# Patient Record
Sex: Male | Born: 1990
Health system: Southern US, Community
[De-identification: ages and names within clinical notes are randomized; demographics above are authoritative.]

## PROBLEM LIST (undated history)

## (undated) DIAGNOSIS — E119 Type 2 diabetes mellitus without complications: Secondary | ICD-10-CM

## (undated) DIAGNOSIS — K219 Gastro-esophageal reflux disease without esophagitis: Secondary | ICD-10-CM

## (undated) DIAGNOSIS — E669 Obesity, unspecified: Secondary | ICD-10-CM

## (undated) DIAGNOSIS — I10 Essential (primary) hypertension: Secondary | ICD-10-CM

## (undated) DIAGNOSIS — B081 Molluscum contagiosum: Secondary | ICD-10-CM

## (undated) DIAGNOSIS — J42 Unspecified chronic bronchitis: Secondary | ICD-10-CM

## (undated) DIAGNOSIS — R809 Proteinuria, unspecified: Secondary | ICD-10-CM

## (undated) DIAGNOSIS — K861 Other chronic pancreatitis: Secondary | ICD-10-CM

## (undated) DIAGNOSIS — K760 Fatty (change of) liver, not elsewhere classified: Secondary | ICD-10-CM

## (undated) DIAGNOSIS — E781 Pure hyperglyceridemia: Secondary | ICD-10-CM

## (undated) HISTORY — PX: TONSILLECTOMY AND ADENOIDECTOMY: SUR1326

---
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
## 2011-05-17 ENCOUNTER — Emergency Department (HOSPITAL_COMMUNITY)
Admission: EM | Admit: 2011-05-17 | Discharge: 2011-05-18 | Disposition: A | Discharge status: Home / Self Care | Payer: Medicaid Other | Attending: Emergency Medicine | Admitting: Emergency Medicine

## 2011-05-17 ENCOUNTER — Emergency Department (HOSPITAL_COMMUNITY): Payer: Medicaid Other

## 2011-05-17 DIAGNOSIS — R059 Cough, unspecified: Secondary | ICD-10-CM | POA: Insufficient documentation

## 2011-05-17 DIAGNOSIS — R05 Cough: Secondary | ICD-10-CM | POA: Insufficient documentation

## 2011-05-17 DIAGNOSIS — I10 Essential (primary) hypertension: Secondary | ICD-10-CM | POA: Insufficient documentation

## 2011-05-17 DIAGNOSIS — J40 Bronchitis, not specified as acute or chronic: Secondary | ICD-10-CM | POA: Insufficient documentation

## 2011-05-17 DIAGNOSIS — F17200 Nicotine dependence, unspecified, uncomplicated: Secondary | ICD-10-CM | POA: Insufficient documentation

## 2011-05-17 DIAGNOSIS — R0609 Other forms of dyspnea: Secondary | ICD-10-CM | POA: Insufficient documentation

## 2011-05-17 DIAGNOSIS — E7800 Pure hypercholesterolemia, unspecified: Secondary | ICD-10-CM | POA: Insufficient documentation

## 2011-05-17 DIAGNOSIS — R0989 Other specified symptoms and signs involving the circulatory and respiratory systems: Secondary | ICD-10-CM | POA: Insufficient documentation

## 2011-07-08 ENCOUNTER — Emergency Department (HOSPITAL_COMMUNITY)
Admission: EM | Admit: 2011-07-08 | Discharge: 2011-07-08 | Disposition: A | Discharge status: Home / Self Care | Payer: Medicaid Other | Attending: Emergency Medicine | Admitting: Emergency Medicine

## 2011-07-08 DIAGNOSIS — R059 Cough, unspecified: Secondary | ICD-10-CM | POA: Insufficient documentation

## 2011-07-08 DIAGNOSIS — I10 Essential (primary) hypertension: Secondary | ICD-10-CM | POA: Insufficient documentation

## 2011-07-08 DIAGNOSIS — F3289 Other specified depressive episodes: Secondary | ICD-10-CM | POA: Insufficient documentation

## 2011-07-08 DIAGNOSIS — E7800 Pure hypercholesterolemia, unspecified: Secondary | ICD-10-CM | POA: Insufficient documentation

## 2011-07-08 DIAGNOSIS — R509 Fever, unspecified: Secondary | ICD-10-CM | POA: Insufficient documentation

## 2011-07-08 DIAGNOSIS — R05 Cough: Secondary | ICD-10-CM | POA: Insufficient documentation

## 2011-07-08 DIAGNOSIS — F329 Major depressive disorder, single episode, unspecified: Secondary | ICD-10-CM | POA: Insufficient documentation

## 2011-07-08 DIAGNOSIS — J45909 Unspecified asthma, uncomplicated: Secondary | ICD-10-CM | POA: Insufficient documentation

## 2011-07-08 DIAGNOSIS — J3489 Other specified disorders of nose and nasal sinuses: Secondary | ICD-10-CM | POA: Insufficient documentation

## 2011-07-10 LAB — CBC
Hemoglobin: 14.4 g/dL (ref 12.0–16.0)
MCHC: 34.1 g/dL (ref 31.0–37.0)
MCV: 83.3 fL (ref 78.0–98.0)
Platelets: 274 10*3/uL (ref 150–400)
RDW: 13.3 % (ref 11.4–15.5)
WBC: 5.6 10*3/uL (ref 4.5–13.5)

## 2011-07-10 LAB — RAPID URINE DRUG SCREEN, HOSP PERFORMED
Barbiturates: NOT DETECTED
Cocaine: NOT DETECTED

## 2011-07-10 LAB — URINALYSIS, ROUTINE W REFLEX MICROSCOPIC
Bilirubin Urine: NEGATIVE
Hgb urine dipstick: NEGATIVE
Specific Gravity, Urine: 1.022 (ref 1.005–1.030)
Urobilinogen, UA: 0.2 mg/dL (ref 0.0–1.0)

## 2011-07-10 LAB — BASIC METABOLIC PANEL
BUN: 8 mg/dL (ref 6–23)
Chloride: 106 mEq/L (ref 96–112)
Creatinine, Ser: 0.91 mg/dL (ref 0.4–1.5)
Sodium: 140 mEq/L (ref 135–145)

## 2011-07-10 LAB — URINALYSIS, MICROSCOPIC ONLY
Leukocytes, UA: NEGATIVE
Protein, ur: NEGATIVE mg/dL
Specific Gravity, Urine: 1.021 (ref 1.005–1.030)
Urobilinogen, UA: 1 mg/dL (ref 0.0–1.0)

## 2011-07-10 LAB — DIFFERENTIAL
Basophils Relative: 1 % (ref 0–1)
Eosinophils Absolute: 0.1 10*3/uL (ref 0.0–1.2)
Eosinophils Relative: 2 % (ref 0–5)
Lymphocytes Relative: 35 % (ref 24–48)
Lymphs Abs: 2 10*3/uL (ref 1.1–4.8)
Neutrophils Relative %: 55 % (ref 43–71)

## 2011-07-10 LAB — C4 COMPLEMENT: Complement C4, Body Fluid: 28 mg/dL (ref 16–47)

## 2011-07-10 LAB — PROTEIN / CREATININE RATIO, URINE
Creatinine, Urine: 97.8 mg/dL
Total Protein, Urine: 7 mg/dL

## 2011-07-10 LAB — C3 COMPLEMENT: C3 Complement: 132 mg/dL (ref 88–201)

## 2011-07-10 LAB — ACETAMINOPHEN LEVEL: Acetaminophen (Tylenol), Serum: <10 ug/mL — ABNORMAL LOW (ref 10–30)

## 2012-04-09 ENCOUNTER — Encounter (HOSPITAL_COMMUNITY): Payer: Self-pay | Admitting: *Deleted

## 2012-04-09 ENCOUNTER — Emergency Department (HOSPITAL_COMMUNITY)
Admission: EM | Admit: 2012-04-09 | Discharge: 2012-04-10 | Disposition: A | Discharge status: Home / Self Care | Payer: No Typology Code available for payment source | Attending: Emergency Medicine | Admitting: Emergency Medicine

## 2012-04-09 ENCOUNTER — Emergency Department (HOSPITAL_COMMUNITY): Payer: No Typology Code available for payment source

## 2012-04-09 DIAGNOSIS — K219 Gastro-esophageal reflux disease without esophagitis: Secondary | ICD-10-CM | POA: Insufficient documentation

## 2012-04-09 DIAGNOSIS — F17200 Nicotine dependence, unspecified, uncomplicated: Secondary | ICD-10-CM | POA: Insufficient documentation

## 2012-04-09 DIAGNOSIS — R0789 Other chest pain: Secondary | ICD-10-CM

## 2012-04-09 MED ORDER — GI COCKTAIL ~~LOC~~
30.0000 mL | Freq: Once | ORAL | Status: AC
  Administered 2012-04-10: 30 mL via ORAL
  Filled 2012-04-09: qty 30

## 2012-04-09 NOTE — ED Provider Notes (Addendum)
History     CSN: [id number]  Arrival date & time 04/09/12  2017   First MD Initiated Contact with Patient 04/09/12 2303      Chief Complaint  Patient presents with  . Chest Pain    (Consider location/radiation/quality/duration/timing/severity/associated sxs/prior treatment) HPI 21 year old male presents to the emergency department complaining of acid reflux and chest pain. Had reflux symptoms in the past. He reports this morning he woke up around 10:30 with a hot burning sensation in his upper chest and throat. He took 2 Prevacid tablets, and reports the burning improved. He has had persistent heaviness in his chest with sharp pains when he moves around. When he sits forward, pain goes into his back. He denies shortness of breath, nausea, fever. Patient's father had MI in his 36s, had history of diabetes, hypertension, CHF. Patient reports he has had bronchitis in the past, and thinks that his symptoms may be due to bronchitis. Patient smokes 5 cigarettes a day. He denies any  drugs or alcohol.  Patient denies any leg swelling, no history of PE or DVT in himself or family.  No immobilization.    Past Medical History  Diagnosis Date  . Bronchitis     History reviewed. No pertinent past surgical history.  History reviewed. No pertinent family history.  History  Substance Use Topics  . Smoking status: Current Everyday Smoker  . Smokeless tobacco: Not on file  . Alcohol Use: No      Review of Systems  All other systems reviewed and are negative.    Allergies  Review of patient's allergies indicates no known allergies.  Home Medications   Current Outpatient Rx  Name Route Sig Dispense Refill  . LANSOPRAZOLE 30 MG PO CPDR Oral Take 30 mg by mouth daily.      BP 159/93  Pulse 74  Temp 98.1 F (36.7 C) (Oral)  Resp 16  SpO2 99%  Physical Exam  Nursing note and vitals reviewed. Constitutional: He is oriented to person, place, and time. He appears well-developed and  well-nourished.       obese  HENT:  Head: Normocephalic and atraumatic.  Nose: Nose normal.  Mouth/Throat: Oropharynx is clear and moist.  Eyes: Conjunctivae and EOM are normal. Pupils are equal, round, and reactive to light.  Neck: Normal range of motion. Neck supple. No JVD present. No tracheal deviation present. No thyromegaly present.  Cardiovascular: Normal rate, regular rhythm, normal heart sounds and intact distal pulses.  Exam reveals no gallop and no friction rub.   No murmur heard. Pulmonary/Chest: Effort normal and breath sounds normal. No stridor. No respiratory distress. He has no wheezes. He has no rales. He exhibits tenderness (pain with palpation of sternum, reproducing pain).  Abdominal: Soft. Bowel sounds are normal. He exhibits no distension and no mass. There is no tenderness. There is no rebound and no guarding.  Musculoskeletal: Normal range of motion. He exhibits no edema and no tenderness.  Lymphadenopathy:    He has no cervical adenopathy.  Neurological: He is oriented to person, place, and time. He exhibits normal muscle tone. Coordination normal.  Skin: Skin is dry. No rash noted. No erythema. No pallor.  Psychiatric: He has a normal mood and affect. His behavior is normal. Judgment and thought content normal.    ED Course  Procedures (including critical care time)   Labs Reviewed  CARDIAC PANEL(CRET KIN+CKTOT+MB+TROPI)  CBC  BASIC METABOLIC PANEL   No results found.     Date: 04/09/2012  Rate:  86  Rhythm: normal sinus rhythm  QRS Axis: normal  Intervals: normal  ST/T Wave abnormalities: t wave inversions III, AVF  Conduction Disutrbances:none  Narrative Interpretation: t wave changes new  Old EKG Reviewed: changes noted    1. Atypical chest pain   2. GERD (gastroesophageal reflux disease)       MDM  21 yo male with GERD type symptoms, but with new t wave changes.  Pain has been present since 1030 am.  Feel this is very unlikely to be ACS,  but myocarditis, bronchitis, or esophageal spasm are in differential.  To have labs, cxr, gi coctail.      12:47 AM Pt feeling slightly better.  labwork unremarkable.  Will d/c home with carafate and scheduled prevacid  Olivia Mackie, MD 04/10/12 6213  Olivia Mackie, MD 04/10/12 [id number]

## 2012-04-09 NOTE — ED Notes (Signed)
Pt states he woke up with acid reflux and took 2 OTC acid reflux medications the acid reflux went away but the pain did not. Pt states that when he bends over he has a pain that goes through to his back. Pt states heaviness to chest.

## 2012-04-10 LAB — CBC
HCT: 39.8 % (ref 39.0–52.0)
MCHC: 34.9 g/dL (ref 30.0–36.0)
RDW: 13.2 % (ref 11.5–15.5)

## 2012-04-10 LAB — BASIC METABOLIC PANEL
BUN: 6 mg/dL (ref 6–23)
Calcium: 9.4 mg/dL (ref 8.4–10.5)
Chloride: 103 mEq/L (ref 96–112)
Creatinine, Ser: 0.66 mg/dL (ref 0.50–1.35)
GFR calc Af Amer: >90 mL/min (ref >90)
GFR calc non Af Amer: >90 mL/min (ref >90)

## 2012-04-10 LAB — CARDIAC PANEL(CRET KIN+CKTOT+MB+TROPI)
CK, MB: 3.4 ng/mL (ref 0.3–4.0)
Total CK: 356 U/L — ABNORMAL HIGH (ref 7–232)
Troponin I: <0.3 ng/mL (ref <0.30)

## 2012-04-10 MED ORDER — LANSOPRAZOLE 30 MG PO CPDR: 30.0000 mg | DELAYED_RELEASE_CAPSULE | Freq: Every day | ORAL | Status: DC

## 2012-04-10 MED ORDER — SUCRALFATE 1 G PO TABS: 1.0000 g | ORAL_TABLET | Freq: Four times a day (QID) | ORAL | Status: DC

## 2012-04-10 NOTE — ED Notes (Signed)
Pt states understanding of discharge instructions 

## 2012-09-05 ENCOUNTER — Ambulatory Visit
Admission: RE | Admit: 2012-09-05 | Payer: No Typology Code available for payment source | Source: Ambulatory Visit | Admitting: Radiation Oncology

## 2012-12-09 ENCOUNTER — Emergency Department (HOSPITAL_COMMUNITY)
Admission: EM | Admit: 2012-12-09 | Discharge: 2012-12-09 | Disposition: A | Discharge status: Home / Self Care | Payer: Self-pay | Attending: Emergency Medicine | Admitting: Emergency Medicine

## 2012-12-09 ENCOUNTER — Other Ambulatory Visit: Payer: Self-pay

## 2012-12-09 ENCOUNTER — Emergency Department (HOSPITAL_COMMUNITY): Payer: Self-pay

## 2012-12-09 ENCOUNTER — Encounter (HOSPITAL_COMMUNITY): Payer: Self-pay | Admitting: Emergency Medicine

## 2012-12-09 DIAGNOSIS — F17200 Nicotine dependence, unspecified, uncomplicated: Secondary | ICD-10-CM | POA: Insufficient documentation

## 2012-12-09 DIAGNOSIS — Z79899 Other long term (current) drug therapy: Secondary | ICD-10-CM | POA: Insufficient documentation

## 2012-12-09 DIAGNOSIS — R0789 Other chest pain: Secondary | ICD-10-CM

## 2012-12-09 DIAGNOSIS — R071 Chest pain on breathing: Secondary | ICD-10-CM | POA: Insufficient documentation

## 2012-12-09 DIAGNOSIS — Z8709 Personal history of other diseases of the respiratory system: Secondary | ICD-10-CM | POA: Insufficient documentation

## 2012-12-09 DIAGNOSIS — K219 Gastro-esophageal reflux disease without esophagitis: Secondary | ICD-10-CM | POA: Insufficient documentation

## 2012-12-09 DIAGNOSIS — I10 Essential (primary) hypertension: Secondary | ICD-10-CM | POA: Insufficient documentation

## 2012-12-09 HISTORY — DX: Essential (primary) hypertension: I10

## 2012-12-09 MED ORDER — KETOROLAC TROMETHAMINE 60 MG/2ML IM SOLN
60.0000 mg | Freq: Once | INTRAMUSCULAR | Status: AC
  Administered 2012-12-09: 60 mg via INTRAMUSCULAR
  Filled 2012-12-09: qty 2

## 2012-12-09 MED ORDER — GUAIFENESIN ER 1200 MG PO TB12: 1.0000 | ORAL_TABLET | Freq: Two times a day (BID) | ORAL | Status: DC

## 2012-12-09 MED ORDER — GI COCKTAIL ~~LOC~~
30.0000 mL | Freq: Once | ORAL | Status: AC
  Administered 2012-12-09: 30 mL via ORAL
  Filled 2012-12-09: qty 30

## 2012-12-09 MED ORDER — HYDROCODONE-ACETAMINOPHEN 5-325 MG PO TABS
1.0000 | ORAL_TABLET | Freq: Once | ORAL | Status: AC
  Administered 2012-12-09: 1 via ORAL
  Filled 2012-12-09: qty 1

## 2012-12-09 MED ORDER — IBUPROFEN 800 MG PO TABS: 800.0000 mg | ORAL_TABLET | Freq: Three times a day (TID) | ORAL | Status: DC | PRN

## 2012-12-09 MED ORDER — HYDROCODONE-ACETAMINOPHEN 5-325 MG PO TABS: 1.0000 | ORAL_TABLET | Freq: Four times a day (QID) | ORAL | Status: DC | PRN

## 2012-12-09 NOTE — ED Provider Notes (Signed)
History     CSN: [id number]  Arrival date & time 12/09/12  0821   First MD Initiated Contact with Patient 12/09/12 [id number]      Chief Complaint  Patient presents with  . Chest Pain    (Consider location/radiation/quality/duration/timing/severity/associated sxs/prior treatment) HPI Patient presents to the emergency department with bilateral chest pain that began this morning.  Patient, states, that he was having indigestion last night, but now he's having diffuse, chest pain is worse with palpation.  Patient denies shortness of breath, nausea, vomiting, diaphoresis, headache, blurred vision, fever, abdominal pain, or back pain.  Patient, states, that he had a similar episode of this type of chest pain before and was seen here in the emergency department and discharged home.  Patient, states, that he did not take anything prior to arrival for his symptoms.  Past Medical History  Diagnosis Date  . Bronchitis   . Hypertension   . Acid reflux     Past Surgical History  Procedure Laterality Date  . Tonsillectomy      History reviewed. No pertinent family history.  History  Substance Use Topics  . Smoking status: Current Every Day Smoker -- 0.50 packs/day  . Smokeless tobacco: Not on file  . Alcohol Use: No      Review of Systems All other systems negative except as documented in the HPI. All pertinent positives and negatives as reviewed in the HPI. Allergies  Review of patient's allergies indicates no known allergies.  Home Medications   Current Outpatient Rx  Name  Route  Sig  Dispense  Refill  . lansoprazole (PREVACID) 30 MG capsule   Oral   Take 30 mg by mouth daily.           BP 141/76  Pulse 75  Temp(Src) 98.1 F (36.7 C) (Oral)  Resp 15  SpO2 99%  Physical Exam  Nursing note and vitals reviewed. Constitutional: He is oriented to person, place, and time. He appears well-developed and well-nourished. No distress.  HENT:  Head: Normocephalic and  atraumatic.  Mouth/Throat: Oropharynx is clear and moist.  Neck: Normal range of motion. Neck supple.  Cardiovascular: Normal rate, regular rhythm and normal heart sounds.  Exam reveals no gallop and no friction rub.   No murmur heard. Pulmonary/Chest: Effort normal and breath sounds normal. No respiratory distress. He has no wheezes. He has no rales. He exhibits tenderness. He exhibits no crepitus and no deformity.    Abdominal: Soft. Bowel sounds are normal. He exhibits no distension. There is no tenderness. There is no guarding.  Neurological: He is alert and oriented to person, place, and time.  Skin: Skin is warm and dry. No erythema.    ED Course  Procedures (including critical care time)  Labs Reviewed - No data to display Dg Chest 2 View  12/09/2012  *RADIOLOGY REPORT*  Clinical Data: Chest pain  CHEST - 2 VIEW  Comparison: 04/09/2012  Findings: Upper normal heart size.  Lungs under aerated and grossly clear.  No pneumothorax or pleural effusion.  No acute bony deformity.  IMPRESSION: No active cardiopulmonary disease.   Original Report Authenticated By: Jolaine Click, M.D.    Patient has what appears to be atypical type chest pain, based on his history of present illness and physical exam findings.  The patient has diffuse palpable chest pain on exam.  The patient is PERC negative.  Patient does not have any history of blood clots and no family history of blood clots.  Patient has  been otherwise stable here in the emergency room other than pain.  I did not feel any further testing needed to be performed on the patient based on the fact that he had a normal chest x-ray and EKG.     MDM   Date: 12/09/2012  Rate: 71  Rhythm: normal sinus rhythm  QRS Axis: normal  Intervals: normal  ST/T Wave abnormalities: normal  Conduction Disutrbances:none  Narrative Interpretation:   Old EKG Reviewed: unchanged  MDM Reviewed: vitals, nursing note and previous chart Reviewed previous: labs  and ECG Interpretation: ECG and x-ray            Carlyle Dolly, PA-C 12/09/12 1051

## 2012-12-09 NOTE — ED Provider Notes (Signed)
Medical screening examination/treatment/procedure(s) were performed by non-physician practitioner and as supervising physician I was immediately available for consultation/collaboration.  Nathan R. Pickering, MD 12/09/12 1519 

## 2012-12-09 NOTE — ED Notes (Signed)
Patient transported to X-ray 

## 2012-12-09 NOTE — ED Notes (Signed)
Pt from home, via ems, c/o CP. Pt woke up w/ pounding pain moving to different spots in chest. Pt states emesis this am.

## 2013-11-05 DIAGNOSIS — K760 Fatty (change of) liver, not elsewhere classified: Secondary | ICD-10-CM

## 2013-11-05 HISTORY — DX: Fatty (change of) liver, not elsewhere classified: K76.0

## 2013-11-18 ENCOUNTER — Emergency Department (HOSPITAL_COMMUNITY)
Admission: EM | Admit: 2013-11-18 | Discharge: 2013-11-19 | Disposition: A | Discharge status: Home / Self Care | Payer: Medicaid Other | Attending: Emergency Medicine | Admitting: Emergency Medicine

## 2013-11-18 ENCOUNTER — Emergency Department (HOSPITAL_COMMUNITY): Payer: Self-pay

## 2013-11-18 ENCOUNTER — Encounter (HOSPITAL_COMMUNITY): Payer: Self-pay | Admitting: Emergency Medicine

## 2013-11-18 DIAGNOSIS — R110 Nausea: Secondary | ICD-10-CM | POA: Insufficient documentation

## 2013-11-18 DIAGNOSIS — R109 Unspecified abdominal pain: Secondary | ICD-10-CM

## 2013-11-18 DIAGNOSIS — R81 Glycosuria: Secondary | ICD-10-CM

## 2013-11-18 DIAGNOSIS — F17200 Nicotine dependence, unspecified, uncomplicated: Secondary | ICD-10-CM | POA: Insufficient documentation

## 2013-11-18 DIAGNOSIS — R630 Anorexia: Secondary | ICD-10-CM | POA: Insufficient documentation

## 2013-11-18 DIAGNOSIS — Z8709 Personal history of other diseases of the respiratory system: Secondary | ICD-10-CM | POA: Insufficient documentation

## 2013-11-18 DIAGNOSIS — I10 Essential (primary) hypertension: Secondary | ICD-10-CM | POA: Insufficient documentation

## 2013-11-18 DIAGNOSIS — R1084 Generalized abdominal pain: Secondary | ICD-10-CM | POA: Insufficient documentation

## 2013-11-18 LAB — URINE MICROSCOPIC-ADD ON

## 2013-11-18 LAB — COMPREHENSIVE METABOLIC PANEL
ALT: 13 U/L (ref 0–53)
AST: 106 U/L — ABNORMAL HIGH (ref 0–37)
Albumin: 4 g/dL (ref 3.5–5.2)
Alkaline Phosphatase: 42 U/L (ref 39–117)
BUN: 11 mg/dL (ref 6–23)
CO2: 18 meq/L — AB (ref 19–32)
CREATININE: 0.71 mg/dL (ref 0.50–1.35)
Calcium: 8.7 mg/dL (ref 8.4–10.5)
Chloride: 90 mEq/L — ABNORMAL LOW (ref 96–112)
GLUCOSE: 268 mg/dL — AB (ref 70–99)
Potassium: 7.2 mEq/L (ref 3.7–5.3)
Sodium: 127 mEq/L — ABNORMAL LOW (ref 137–147)
TOTAL PROTEIN: 7.8 g/dL (ref 6.0–8.3)
Total Bilirubin: 1.1 mg/dL (ref 0.3–1.2)

## 2013-11-18 LAB — CBC WITH DIFFERENTIAL/PLATELET
Basophils Absolute: 0 10*3/uL (ref 0.0–0.1)
Basophils Relative: 0 % (ref 0–1)
Eosinophils Absolute: 0.1 10*3/uL (ref 0.0–0.7)
Eosinophils Relative: 1 % (ref 0–5)
HEMATOCRIT: 42.2 % (ref 39.0–52.0)
HEMOGLOBIN: 16.4 g/dL (ref 13.0–17.0)
LYMPHS PCT: 22 % (ref 12–46)
Lymphs Abs: 1.6 10*3/uL (ref 0.7–4.0)
MCH: 30.4 pg (ref 26.0–34.0)
MCHC: 38.9 g/dL — ABNORMAL HIGH (ref 30.0–36.0)
MCV: 78.1 fL (ref 78.0–100.0)
MONO ABS: 0.5 10*3/uL (ref 0.1–1.0)
Monocytes Relative: 6 % (ref 3–12)
NEUTROS ABS: 5.1 10*3/uL (ref 1.7–7.7)
NEUTROS PCT: 70 % (ref 43–77)
Platelets: 272 10*3/uL (ref 150–400)
RBC: 5.4 MIL/uL (ref 4.22–5.81)
RDW: 13.2 % (ref 11.5–15.5)
WBC: 7.3 10*3/uL (ref 4.0–10.5)

## 2013-11-18 LAB — URINALYSIS, ROUTINE W REFLEX MICROSCOPIC
Glucose, UA: >1000 mg/dL — AB
Ketones, ur: 40 mg/dL — AB
Leukocytes, UA: NEGATIVE
NITRITE: NEGATIVE
PH: 6 (ref 5.0–8.0)
Protein, ur: 100 mg/dL — AB
Specific Gravity, Urine: >1.046 — ABNORMAL HIGH (ref 1.005–1.030)
Urobilinogen, UA: 0.2 mg/dL (ref 0.0–1.0)

## 2013-11-18 LAB — LIPASE, BLOOD: LIPASE: 48 U/L (ref 11–59)

## 2013-11-18 MED ORDER — ONDANSETRON HCL 4 MG/2ML IJ SOLN
4.0000 mg | Freq: Once | INTRAMUSCULAR | Status: AC
  Administered 2013-11-18: 4 mg via INTRAVENOUS
  Filled 2013-11-18: qty 2

## 2013-11-18 MED ORDER — SODIUM CHLORIDE 0.9 % IV SOLN
Freq: Once | INTRAVENOUS | Status: AC
  Administered 2013-11-18: via INTRAVENOUS

## 2013-11-18 MED ORDER — IOHEXOL 300 MG/ML  SOLN
100.0000 mL | Freq: Once | INTRAMUSCULAR | Status: AC | PRN
  Administered 2013-11-18: 100 mL via INTRAVENOUS

## 2013-11-18 MED ORDER — HYDROMORPHONE HCL PF 1 MG/ML IJ SOLN
1.0000 mg | Freq: Once | INTRAMUSCULAR | Status: AC
  Administered 2013-11-19: 1 mg via INTRAVENOUS
  Filled 2013-11-18: qty 1

## 2013-11-18 MED ORDER — HYDROMORPHONE HCL PF 1 MG/ML IJ SOLN
1.0000 mg | Freq: Once | INTRAMUSCULAR | Status: AC
  Administered 2013-11-18: 1 mg via INTRAVENOUS
  Filled 2013-11-18: qty 1

## 2013-11-18 MED ORDER — GI COCKTAIL ~~LOC~~
30.0000 mL | Freq: Once | ORAL | Status: AC
  Administered 2013-11-19: 30 mL via ORAL
  Filled 2013-11-18: qty 30

## 2013-11-18 MED ORDER — FAMOTIDINE IN NACL 20-0.9 MG/50ML-% IV SOLN
20.0000 mg | Freq: Once | INTRAVENOUS | Status: AC
  Administered 2013-11-19: 20 mg via INTRAVENOUS
  Filled 2013-11-18: qty 50

## 2013-11-18 MED ORDER — IOHEXOL 300 MG/ML  SOLN
25.0000 mL | Freq: Once | INTRAMUSCULAR | Status: AC | PRN
  Administered 2013-11-18: 25 mL via ORAL

## 2013-11-18 MED ORDER — SODIUM CHLORIDE 0.9 % IV SOLN
Freq: Once | INTRAVENOUS | Status: AC
  Administered 2013-11-18: 20:00:00 via INTRAVENOUS

## 2013-11-18 NOTE — ED Provider Notes (Signed)
CSN: [id number]     Arrival date & time 11/18/13  1805 History   First MD Initiated Contact with Patient 11/18/13 1908     Chief Complaint  Patient presents with  . Abdominal Pain     (Consider location/radiation/quality/duration/timing/severity/associated sxs/prior Treatment) HPI  Patient presents with abdominal pain.  Symptoms began approximately 24 hours ago, though they were not severe until approximately 18 hours ago. At the time he has had persistent pain in the abdomen, diffusely.  Pain is sore. There is associated nausea, anorexia, but no emesis or diarrhea. No fever, no chills. No relief with OTC medication, no clear exacerbating factors. Patient has no history of abdominal surgery. He denies significant medical problems.   Past Medical History  Diagnosis Date  . Bronchitis   . Hypertension   . Acid reflux    Past Surgical History  Procedure Laterality Date  . Tonsillectomy     History reviewed. No pertinent family history. History  Substance Use Topics  . Smoking status: Current Every Day Smoker -- 0.50 packs/day  . Smokeless tobacco: Not on file  . Alcohol Use: No    Review of Systems  Constitutional:       Per HPI, otherwise negative  HENT:       Per HPI, otherwise negative  Respiratory:       Per HPI, otherwise negative  Cardiovascular:       Per HPI, otherwise negative  Gastrointestinal: Positive for nausea. Negative for vomiting.  Endocrine:       Negative aside from HPI  Genitourinary:       Neg aside from HPI   Musculoskeletal:       Per HPI, otherwise negative  Skin: Negative.   Neurological: Negative for syncope.      Allergies  Review of patient's allergies indicates no known allergies.  Home Medications   Current Outpatient Rx  Name  Route  Sig  Dispense  Refill  . ibuprofen (ADVIL,MOTRIN) 800 MG tablet   Oral   Take 1 tablet (800 mg total) by mouth every 8 (eight) hours as needed for pain.   21 tablet   0    BP 144/58   Pulse 102  Temp(Src) 98.6 F (37 C) (Oral)  Resp 16  Wt 294 lb (133.358 kg)  SpO2 97% Physical Exam  Nursing note and vitals reviewed. Constitutional: He is oriented to person, place, and time. He appears well-developed. No distress.  HENT:  Head: Normocephalic and atraumatic.  Eyes: Conjunctivae and EOM are normal.  Cardiovascular: Normal rate and regular rhythm.   Pulmonary/Chest: Effort normal. No stridor. No respiratory distress.  Abdominal: He exhibits no distension. There is generalized tenderness. There is guarding and tenderness at McBurney's point. There is no rigidity.  Musculoskeletal: He exhibits no edema.  Neurological: He is alert and oriented to person, place, and time.  Skin: Skin is warm and dry.  Psychiatric: He has a normal mood and affect.    ED Course  Procedures (including critical care time) Labs Review Labs Reviewed  CBC WITH DIFFERENTIAL - Abnormal; Notable for the following:    MCHC 38.9 (*)    All other components within normal limits  URINALYSIS, ROUTINE W REFLEX MICROSCOPIC - Abnormal; Notable for the following:    APPearance CLOUDY (*)    Specific Gravity, Urine >1.046 (*)    Glucose, UA >1000 (*)    Hgb urine dipstick MODERATE (*)    Bilirubin Urine SMALL (*)    Ketones, ur 40 (*)  Protein, ur 100 (*)    All other components within normal limits  URINE MICROSCOPIC-ADD ON - Abnormal; Notable for the following:    Squamous Epithelial / LPF FEW (*)    Bacteria, UA FEW (*)    Casts HYALINE CASTS (*)    All other components within normal limits  COMPREHENSIVE METABOLIC PANEL - Abnormal; Notable for the following:    Sodium 127 (*)    Potassium 7.2 (*)    Chloride 90 (*)    CO2 18 (*)    Glucose, Bld 268 (*)    AST 106 (*)    All other components within normal limits  LIPASE, BLOOD  COMPREHENSIVE METABOLIC PANEL  LIPASE, BLOOD  COMPREHENSIVE METABOLIC PANEL    11:53 PM Patient appears comfortable.  Labs hemolyzed - resent Other  labs notable for glucosuria and ketonuria. IVF started on initial eval - now continuing.  12:49 AM Patient asymptomatic, vital signs improved substantially.  I had a lengthy conversation with him, his father, and brother about all results, including the hyperglycemia.  Patient has no history of diabetes.  Given the finding of inflammatory bowel condition, patient will be treated for this, though he will require followup to ensure that he is a diabetic as well. MDM  This young male presents with one day of abdominal pain, nausea, anorexia, no vomiting, no diarrhea.  With tenderness to palpation about the right lateral abdomen, though no peritonitis, there was concern for appendicitis.  CT scan demonstrates likely duodenitis, no appendicitis, no frank cholecystitis, no perforation or abscess. Patient's labs demonstrate hematuria, glucosuria, and hyperglycemia. Patient has no Hx of diabetes.  Patient will follow up with her primary care physician for repeat testing to establish if this is a concurrent new condition. With resolution of his pain, stable vital signs, minimal gap, though the patient does have the aforementioned lab abnormalities, he is stable for discharge with further evaluation, management as an outpatient    Patient had pain management here, remained hemodynamically stable, was discharged in stable condition to follow up with a new primary care team and gastroenterology.  Patient's course was prolonged as labs were hemolyzed on multiple draws.  Gerhard Munchobert Shauna Bodkins, MD 11/19/13 [phone]

## 2013-11-18 NOTE — ED Notes (Signed)
Pt in c/o generalized abd pain with nausea since last night with body aches

## 2013-11-19 LAB — COMPREHENSIVE METABOLIC PANEL
ALK PHOS: 65 U/L (ref 39–117)
ALT: 23 U/L (ref 0–53)
AST: 24 U/L (ref 0–37)
Albumin: 3.8 g/dL (ref 3.5–5.2)
BILIRUBIN TOTAL: 1.2 mg/dL (ref 0.3–1.2)
BUN: 10 mg/dL (ref 6–23)
CHLORIDE: 89 meq/L — AB (ref 96–112)
CO2: 22 mEq/L (ref 19–32)
Calcium: 8.8 mg/dL (ref 8.4–10.5)
Creatinine, Ser: 0.73 mg/dL (ref 0.50–1.35)
GFR calc Af Amer: >90 mL/min (ref >90)
GFR calc non Af Amer: >90 mL/min (ref >90)
Glucose, Bld: 248 mg/dL — ABNORMAL HIGH (ref 70–99)
POTASSIUM: 4.8 meq/L (ref 3.7–5.3)
Sodium: 127 mEq/L — ABNORMAL LOW (ref 137–147)
TOTAL PROTEIN: 7.2 g/dL (ref 6.0–8.3)

## 2013-11-19 MED ORDER — FAMOTIDINE 20 MG PO TABS: 20.0000 mg | ORAL_TABLET | Freq: Two times a day (BID) | ORAL | Status: DC

## 2013-11-19 MED ORDER — ONDANSETRON 4 MG PO TBDP: 4.0000 mg | ORAL_TABLET | Freq: Three times a day (TID) | ORAL | Status: DC | PRN

## 2013-11-19 MED ORDER — SUCRALFATE 1 GM/10ML PO SUSP: 1.0000 g | Freq: Three times a day (TID) | ORAL | Status: DC

## 2013-11-19 NOTE — ED Notes (Signed)
Pt states understanding of discharge instructions 

## 2013-11-19 NOTE — Discharge Instructions (Signed)
As discussed, your evaluation in the emergency department has resulted in a diagnosis of abdominal pain will likely do to duodenitis - or inflammation of the first part of your small intestines.  However, with the additional concern of elevated blood sugar, it is very important that you have your blood checked again this week, as your pain improves, to ensure that you do not have diabetes.  Please take all medication as directed and be sure to follow-up with your physicians.  Return here for concerning changes in your condition.

## 2013-11-20 ENCOUNTER — Inpatient Hospital Stay (HOSPITAL_COMMUNITY)
Admission: EM | Admit: 2013-11-20 | Discharge: 2013-11-24 | DRG: 392 | Disposition: A | Discharge status: Home / Self Care | Payer: Self-pay | Attending: Internal Medicine | Admitting: Internal Medicine

## 2013-11-20 ENCOUNTER — Encounter (HOSPITAL_COMMUNITY): Payer: Self-pay | Admitting: Emergency Medicine

## 2013-11-20 ENCOUNTER — Emergency Department (HOSPITAL_COMMUNITY): Payer: Self-pay

## 2013-11-20 ENCOUNTER — Inpatient Hospital Stay (HOSPITAL_COMMUNITY): Payer: Medicaid Other

## 2013-11-20 DIAGNOSIS — F17200 Nicotine dependence, unspecified, uncomplicated: Secondary | ICD-10-CM | POA: Diagnosis present

## 2013-11-20 DIAGNOSIS — R739 Hyperglycemia, unspecified: Secondary | ICD-10-CM | POA: Diagnosis present

## 2013-11-20 DIAGNOSIS — I10 Essential (primary) hypertension: Secondary | ICD-10-CM | POA: Diagnosis present

## 2013-11-20 DIAGNOSIS — M79609 Pain in unspecified limb: Secondary | ICD-10-CM

## 2013-11-20 DIAGNOSIS — Z833 Family history of diabetes mellitus: Secondary | ICD-10-CM

## 2013-11-20 DIAGNOSIS — E119 Type 2 diabetes mellitus without complications: Secondary | ICD-10-CM | POA: Diagnosis present

## 2013-11-20 DIAGNOSIS — R17 Unspecified jaundice: Secondary | ICD-10-CM | POA: Diagnosis present

## 2013-11-20 DIAGNOSIS — R112 Nausea with vomiting, unspecified: Principal | ICD-10-CM | POA: Diagnosis present

## 2013-11-20 DIAGNOSIS — E118 Type 2 diabetes mellitus with unspecified complications: Secondary | ICD-10-CM

## 2013-11-20 DIAGNOSIS — R109 Unspecified abdominal pain: Secondary | ICD-10-CM

## 2013-11-20 DIAGNOSIS — R7309 Other abnormal glucose: Secondary | ICD-10-CM

## 2013-11-20 DIAGNOSIS — K219 Gastro-esophageal reflux disease without esophagitis: Secondary | ICD-10-CM | POA: Diagnosis present

## 2013-11-20 HISTORY — DX: Unspecified chronic bronchitis: J42

## 2013-11-20 LAB — COMPREHENSIVE METABOLIC PANEL
ALK PHOS: 74 U/L (ref 39–117)
ALT: 25 U/L (ref 0–53)
AST: 21 U/L (ref 0–37)
Albumin: 4 g/dL (ref 3.5–5.2)
BUN: 8 mg/dL (ref 6–23)
CALCIUM: 9.5 mg/dL (ref 8.4–10.5)
CO2: 21 mEq/L (ref 19–32)
Chloride: 91 mEq/L — ABNORMAL LOW (ref 96–112)
Creatinine, Ser: 0.77 mg/dL (ref 0.50–1.35)
GFR calc Af Amer: >90 mL/min (ref >90)
Glucose, Bld: 279 mg/dL — ABNORMAL HIGH (ref 70–99)
POTASSIUM: 4 meq/L (ref 3.7–5.3)
SODIUM: 131 meq/L — AB (ref 137–147)
TOTAL PROTEIN: 8.3 g/dL (ref 6.0–8.3)
Total Bilirubin: 1.7 mg/dL — ABNORMAL HIGH (ref 0.3–1.2)

## 2013-11-20 LAB — CBC WITH DIFFERENTIAL/PLATELET
BASOS ABS: 0 10*3/uL (ref 0.0–0.1)
BASOS PCT: 0 % (ref 0–1)
Basophils Absolute: 0 10*3/uL (ref 0.0–0.1)
Basophils Relative: 0 % (ref 0–1)
Eosinophils Absolute: 0 10*3/uL (ref 0.0–0.7)
Eosinophils Absolute: 0 10*3/uL (ref 0.0–0.7)
Eosinophils Relative: 0 % (ref 0–5)
Eosinophils Relative: 0 % (ref 0–5)
HEMATOCRIT: 42.6 % (ref 39.0–52.0)
HEMATOCRIT: 39.3 % (ref 39.0–52.0)
Hemoglobin: 15.6 g/dL (ref 13.0–17.0)
Hemoglobin: 13.9 g/dL (ref 13.0–17.0)
LYMPHS PCT: 14 % (ref 12–46)
LYMPHS PCT: 15 % (ref 12–46)
Lymphs Abs: 1.5 10*3/uL (ref 0.7–4.0)
Lymphs Abs: 1.4 10*3/uL (ref 0.7–4.0)
MCH: 29.3 pg (ref 26.0–34.0)
MCH: 28.5 pg (ref 26.0–34.0)
MCHC: 36.6 g/dL — AB (ref 30.0–36.0)
MCHC: 35.4 g/dL (ref 30.0–36.0)
MCV: 79.9 fL (ref 78.0–100.0)
MCV: 80.5 fL (ref 78.0–100.0)
MONO ABS: 0.8 10*3/uL (ref 0.1–1.0)
Monocytes Absolute: 0.9 10*3/uL (ref 0.1–1.0)
Monocytes Relative: 8 % (ref 3–12)
Monocytes Relative: 8 % (ref 3–12)
NEUTROS PCT: 78 % — AB (ref 43–77)
Neutro Abs: 8.6 10*3/uL — ABNORMAL HIGH (ref 1.7–7.7)
Neutro Abs: 7.3 10*3/uL (ref 1.7–7.7)
Neutrophils Relative %: 77 % (ref 43–77)
Platelets: 222 10*3/uL (ref 150–400)
Platelets: 197 10*3/uL (ref 150–400)
RBC: 5.33 MIL/uL (ref 4.22–5.81)
RBC: 4.88 MIL/uL (ref 4.22–5.81)
RDW: 13.1 % (ref 11.5–15.5)
RDW: 13.1 % (ref 11.5–15.5)
WBC: 11 10*3/uL — AB (ref 4.0–10.5)
WBC: 9.5 10*3/uL (ref 4.0–10.5)

## 2013-11-20 LAB — HEPATIC FUNCTION PANEL
ALT: 20 U/L (ref 0–53)
AST: 14 U/L (ref 0–37)
Albumin: 3.3 g/dL — ABNORMAL LOW (ref 3.5–5.2)
Alkaline Phosphatase: 66 U/L (ref 39–117)
BILIRUBIN DIRECT: 0.2 mg/dL (ref 0.0–0.3)
Indirect Bilirubin: 1.1 mg/dL — ABNORMAL HIGH (ref 0.3–0.9)
Total Bilirubin: 1.3 mg/dL — ABNORMAL HIGH (ref 0.3–1.2)
Total Protein: 7.3 g/dL (ref 6.0–8.3)

## 2013-11-20 LAB — HEMOGLOBIN A1C
Hgb A1c MFr Bld: 10.3 % — ABNORMAL HIGH (ref <5.7)
Mean Plasma Glucose: 249 mg/dL — ABNORMAL HIGH (ref <117)

## 2013-11-20 LAB — BASIC METABOLIC PANEL
BUN: 8 mg/dL (ref 6–23)
CO2: 23 mEq/L (ref 19–32)
Calcium: 8.9 mg/dL (ref 8.4–10.5)
Chloride: 99 mEq/L (ref 96–112)
Creatinine, Ser: 0.8 mg/dL (ref 0.50–1.35)
GFR calc Af Amer: >90 mL/min (ref >90)
GFR calc non Af Amer: >90 mL/min (ref >90)
Glucose, Bld: 234 mg/dL — ABNORMAL HIGH (ref 70–99)
Potassium: 4 mEq/L (ref 3.7–5.3)
Sodium: 137 mEq/L (ref 137–147)

## 2013-11-20 LAB — TSH: TSH: 0.936 u[IU]/mL (ref 0.350–4.500)

## 2013-11-20 LAB — RAPID URINE DRUG SCREEN, HOSP PERFORMED
Amphetamines: NOT DETECTED
BARBITURATES: NOT DETECTED
BENZODIAZEPINES: NOT DETECTED
Cocaine: NOT DETECTED
Opiates: POSITIVE — AB
TETRAHYDROCANNABINOL: POSITIVE — AB

## 2013-11-20 LAB — URINALYSIS, ROUTINE W REFLEX MICROSCOPIC
Bilirubin Urine: NEGATIVE
Glucose, UA: >1000 mg/dL — AB
Ketones, ur: 40 mg/dL — AB
LEUKOCYTES UA: NEGATIVE
NITRITE: NEGATIVE
UROBILINOGEN UA: 1 mg/dL (ref 0.0–1.0)
pH: 5.5 (ref 5.0–8.0)

## 2013-11-20 LAB — URINE MICROSCOPIC-ADD ON

## 2013-11-20 LAB — KETONES, QUALITATIVE

## 2013-11-20 LAB — GLUCOSE, CAPILLARY
GLUCOSE-CAPILLARY: 201 mg/dL — AB (ref 70–99)
GLUCOSE-CAPILLARY: 241 mg/dL — AB (ref 70–99)
Glucose-Capillary: 242 mg/dL — ABNORMAL HIGH (ref 70–99)
Glucose-Capillary: 233 mg/dL — ABNORMAL HIGH (ref 70–99)

## 2013-11-20 LAB — LIPASE, BLOOD: LIPASE: 18 U/L (ref 11–59)

## 2013-11-20 LAB — CG4 I-STAT (LACTIC ACID): LACTIC ACID, VENOUS: 1.87 mmol/L (ref 0.5–2.2)

## 2013-11-20 MED ORDER — CHLORHEXIDINE GLUCONATE 0.12 % MT SOLN
15.0000 mL | Freq: Two times a day (BID) | OROMUCOSAL | Status: DC
  Administered 2013-11-20 – 2013-11-22 (×4): 15 mL via OROMUCOSAL
  Filled 2013-11-20 (×3): qty 15

## 2013-11-20 MED ORDER — MORPHINE SULFATE 4 MG/ML IJ SOLN
4.0000 mg | Freq: Once | INTRAMUSCULAR | Status: AC
  Administered 2013-11-20: 4 mg via INTRAVENOUS
  Filled 2013-11-20: qty 1

## 2013-11-20 MED ORDER — ONDANSETRON HCL 4 MG/2ML IJ SOLN
4.0000 mg | Freq: Four times a day (QID) | INTRAMUSCULAR | Status: DC | PRN
  Administered 2013-11-22: 4 mg via INTRAVENOUS
  Filled 2013-11-20: qty 2

## 2013-11-20 MED ORDER — ACETAMINOPHEN 650 MG RE SUPP: 650.0000 mg | Freq: Four times a day (QID) | RECTAL | Status: DC | PRN

## 2013-11-20 MED ORDER — INSULIN ASPART 100 UNIT/ML ~~LOC~~ SOLN
0.0000 [IU] | Freq: Three times a day (TID) | SUBCUTANEOUS | Status: DC
  Administered 2013-11-20 (×3): 3 [IU] via SUBCUTANEOUS
  Administered 2013-11-21: 2 [IU] via SUBCUTANEOUS
  Administered 2013-11-21: 3 [IU] via SUBCUTANEOUS
  Administered 2013-11-21 – 2013-11-22 (×2): 2 [IU] via SUBCUTANEOUS
  Administered 2013-11-22: 3 [IU] via SUBCUTANEOUS
  Administered 2013-11-22 – 2013-11-23 (×3): 2 [IU] via SUBCUTANEOUS
  Administered 2013-11-23: 3 [IU] via SUBCUTANEOUS
  Administered 2013-11-24: 2 [IU] via SUBCUTANEOUS
  Administered 2013-11-24: 5 [IU] via SUBCUTANEOUS

## 2013-11-20 MED ORDER — SODIUM CHLORIDE 0.9 % IV BOLUS (SEPSIS)
1000.0000 mL | Freq: Once | INTRAVENOUS | Status: AC
  Administered 2013-11-20: 1000 mL via INTRAVENOUS

## 2013-11-20 MED ORDER — ONDANSETRON HCL 4 MG/2ML IJ SOLN
4.0000 mg | Freq: Once | INTRAMUSCULAR | Status: AC
  Administered 2013-11-20: 4 mg via INTRAVENOUS
  Filled 2013-11-20: qty 2

## 2013-11-20 MED ORDER — SODIUM CHLORIDE 0.9 % IV SOLN
INTRAVENOUS | Status: AC
Start: 2013-11-20 — End: 2013-11-21
  Administered 2013-11-20 – 2013-11-21 (×3): via INTRAVENOUS

## 2013-11-20 MED ORDER — BIOTENE DRY MOUTH MT LIQD
15.0000 mL | Freq: Two times a day (BID) | OROMUCOSAL | Status: DC
  Administered 2013-11-21 – 2013-11-22 (×3): 15 mL via OROMUCOSAL

## 2013-11-20 MED ORDER — MORPHINE SULFATE 2 MG/ML IJ SOLN
1.0000 mg | INTRAMUSCULAR | Status: DC | PRN
  Administered 2013-11-20 – 2013-11-23 (×11): 1 mg via INTRAVENOUS
  Filled 2013-11-20 (×10): qty 1

## 2013-11-20 MED ORDER — PANTOPRAZOLE SODIUM 40 MG IV SOLR
40.0000 mg | Freq: Once | INTRAVENOUS | Status: AC
  Administered 2013-11-20: 40 mg via INTRAVENOUS
  Filled 2013-11-20: qty 40

## 2013-11-20 MED ORDER — ONDANSETRON HCL 4 MG PO TABS: 4.0000 mg | ORAL_TABLET | Freq: Four times a day (QID) | ORAL | Status: DC | PRN

## 2013-11-20 MED ORDER — ACETAMINOPHEN 325 MG PO TABS
650.0000 mg | ORAL_TABLET | Freq: Four times a day (QID) | ORAL | Status: DC | PRN
  Administered 2013-11-24: 650 mg via ORAL
  Filled 2013-11-20: qty 2

## 2013-11-20 MED ORDER — PANTOPRAZOLE SODIUM 40 MG IV SOLR
40.0000 mg | INTRAVENOUS | Status: DC
  Administered 2013-11-20 – 2013-11-22 (×3): 40 mg via INTRAVENOUS
  Filled 2013-11-20 (×4): qty 40

## 2013-11-20 NOTE — ED Notes (Signed)
Report called to the floor.

## 2013-11-20 NOTE — ED Notes (Signed)
Patient transported to Ultrasound 

## 2013-11-20 NOTE — ED Notes (Signed)
Patient transported to X-ray 

## 2013-11-20 NOTE — Care Management Note (Signed)
    Page 1 of 1   11/22/2013     1:33:27 PM   CARE MANAGEMENT NOTE 11/22/2013  Patient:  Brent Costa,[name]   Account Number:  [id number]  Date Initiated:  11/20/2013  Documentation initiated by:  Ronny FlurryWILE,Timm Bonenberger  Subjective/Objective Assessment:     Action/Plan:   Anticipated DC Date:  11/22/2013   Anticipated DC Plan:  HOME/SELF CARE      DC Planning Services  [facility]      Choice offered to / List presented to:             Status of service:   Medicare Important Message given?   (If response is "NO", the following Medicare IM given date fields will be blank) Date Medicare IM given:   Date Additional Medicare IM given:    Discharge Disposition:    Per UR Regulation:    If discussed at Long Length of Stay Meetings, dates discussed:    Comments:  11-22-13 Discussed with Lenor CoffinAnn Clark ( ordered consult ) . Explained spoke to patient on 11-20-13 . [facility] however it sometimes takes days for response . On the 11-20-13 Patient states he would call that day to make appointment . Also patient has had Medicaid in past , knows what to do to apply . Case Management does not assist in this . Called financial counselor also on 11-20-13 asking them to see patient . Ronny FlurryHeather Pritesh Sobecki RN BSN [phone] 11-20-13 Patient states Medicaid has expired . Confirmed with CMA Nia . Possible discharge tomorrow 11-21-13 . Provided MATCH letter and explained. Patient voiced understanding . Patient does not currently have PCP . [facility] . Encouraged patinet to call today to make appointment . Ronny FlurryHeather Minna Dumire RN BSN [phone]

## 2013-11-20 NOTE — ED Notes (Signed)
Returned from xray

## 2013-11-20 NOTE — Progress Notes (Signed)
VASCULAR LAB PRELIMINARY  PRELIMINARY  PRELIMINARY  PRELIMINARY  Right lower extremity venous Doppler completed.    Preliminary report:  There is no DVT or SVT noted in the right lower extremity.  Jomo Forand, RVT 11/20/2013, 11:40 AM

## 2013-11-20 NOTE — ED Notes (Signed)
Pt stated was here Saturday night was told he had small bowel infection, stated was placed on Carafate,  Pepcid and zofran, stated those medication made him sick to his stomach, stated unable to keep anything down, continue to have generalized abdominal pain.  Stated no BM since Friday night and not passing any flatus.

## 2013-11-20 NOTE — ED Notes (Signed)
Pt resting, family at bedside.  

## 2013-11-20 NOTE — Progress Notes (Signed)
[facility] PROGRESS NOTE  Brent LahShrone A Costa ZOX:[id number] DOB: 18-Apr-1991 DOA: 11/20/2013 PCP: Forest BeckerJENNINGS, JESSICA LYNNE, MD  Assessment/Plan: Principal Problem:  Nausea & vomiting  Active Problems:  Hyperglycemia  23 y/o male with no significant past medical history presented to the ER because of persistent nausea vomiting abdominal pain  1. Nausea vomiting and abdominal pain r/o PUD; CT: Circumferential wall thickening involving the descending duodenum with associated inflammatory change in the paraduodenal fat and pancreaticoduodenal groove -NPO; cont IVF, entiemetics; pain control; PPI, check H pylori  -c/s GI for EGD  2. Hyperglycemia; r/o DM; check HA1C; ISS for now   3. Substance abuse; d/w recommended to quit using drugs   4. Mild Hyperbilirubinemia; US abd: No evidence of cholelithiasis or biliary obstruction -recheck in AM   Code Status: full Family Communication: d/w patient, brother,  (indicate person spoken with, relationship, and if by phone, the number) Disposition Plan: home 24-48 hours    Consultants: GI  Procedures:  None   Antibiotics:  None  (indicate start date, and stop date if known)  HPI/Subjective: alert  Objective: Filed Vitals:   11/20/13 0728  BP: 148/66  Pulse: 106  Temp: 98.8 F (37.1 C)  Resp: 20   No intake or output data in the 24 hours ending 11/20/13 1058 Filed Weights   11/20/13 0222  Weight: 133.358 kg (294 lb)    Exam:   General:  alert  Cardiovascular: s1,s2 rrr  Respiratory: CTA BL  Abdomen: soft, mild epigatric tender, nd   Musculoskeletal: no LE edema   Data Reviewed: Basic Metabolic Panel:  Recent Labs Lab 11/18/13 2130 11/18/13 2331 11/20/13 0240  NA 127* 127* 131*  K 7.2* 4.8 4.0  CL 90* 89* 91*  CO2 18* 22 21  GLUCOSE 268* 248* 279*  BUN 11 10 8   CREATININE 0.71 0.73 0.77  CALCIUM 8.7 8.8 9.5   Liver Function Tests:  Recent Labs Lab 11/18/13 2130 11/18/13 2331 11/20/13 0240   AST 106* 24 21  ALT 13 23 25   ALKPHOS 42 65 74  BILITOT 1.1 1.2 1.7*  PROT 7.8 7.2 8.3  ALBUMIN 4.0 3.8 4.0    Recent Labs Lab 11/18/13 2130 11/20/13 0240  LIPASE 48 18   No results found for this basename: AMMONIA,  in the last 168 hours CBC:  Recent Labs Lab 11/18/13 1923 11/20/13 0240 11/20/13 1005  WBC 7.3 11.0* 9.5  NEUTROABS 5.1 8.6* 7.3  HGB 16.4 15.6 13.9  HCT 42.2 42.6 39.3  MCV 78.1 79.9 80.5  PLT 272 222 197   Cardiac Enzymes: No results found for this basename: CKTOTAL, CKMB, CKMBINDEX, TROPONINI,  in the last 168 hours BNP (last 3 results) No results found for this basename: PROBNP,  in the last 8760 hours CBG:  Recent Labs Lab 11/20/13 0745  GLUCAP 242*    No results found for this or any previous visit (from the past 240 hour(s)).   Studies: [place] Abdomen Complete  11/20/2013   CLINICAL DATA:  Abdominal pain and elevated bili Rubin.  EXAM: ULTRASOUND ABDOMEN COMPLETE  COMPARISON:  None.  FINDINGS: Gallbladder:  No gallstones or wall thickening visualized. No sonographic Murphy sign noted.  Common bile duct:  Diameter: 5 mm, where seen.  No evidence of filling defect.  Liver:  The liver is diffusely echogenic with poor acoustic transmission. No focal abnormality is seen.Antegrade flow in the imaged portal venous system.  IVC:  Limited visualization.  Pancreas:  Limited visualization due to small acoustic windows.  Spleen:  Size and appearance within normal limits.  Right Kidney:  Length: 13 cm. Echogenicity within normal limits. No mass or hydronephrosis visualized.  Left Kidney:  Length: 12 cm. Echogenicity within normal limits. No mass or hydronephrosis visualized.  Abdominal aorta:  No aneurysm visualized.  Other findings:  None.  IMPRESSION: 1. No evidence of cholelithiasis or biliary obstruction. 2. Fatty liver.   Electronically Signed   By: Tiburcio Pea M.D.   On: 11/20/2013 07:13   Ct Abdomen Pelvis W Contrast  11/18/2013   CLINICAL DATA:   Generalized abdominal pain, nausea and body aches  EXAM: CT ABDOMEN AND PELVIS WITH CONTRAST  TECHNIQUE: Multidetector CT imaging of the abdomen and pelvis was performed using the standard protocol following bolus administration of intravenous contrast.  CONTRAST:  OMNIPAQUE IOHEXOL 300 MG/ML  SOLN  COMPARISON:  None.  FINDINGS: Lower Chest: The lung bases are clear. Tiny 2 mm nodule in the periphery of the left lower lobe was almost certainly infectious/inflammatory/granulomatous. Visualized cardiac structures are within normal limits for size. No pericardial effusion. Unremarkable visualized distal thoracic esophagus.  Abdomen: Focal circumferential wall thickening of the descending (D2) segment of the duodenum with a blurring of the pancreaticoduodenal groove. There is a small amount of free fluid posterior to the duodenum along with thickening of the anterior para renal fascia. No extravasation of oral contrast material which opacifies the duodenum lumen. The pancreatic body and tail are unremarkable. The pancreatic head and uncinate process adjacent to the abnormal segment of the duodenum is somewhat less well defined and there may be trace peripancreatic stranding. The findings are most notable in the pancreaticoduodenal groove. Unremarkable appearance of the stomach, spleen, and adrenal glands. Mild hypoattenuation of the hepatic parenchyma suggests fatty infiltration. No discrete hepatic lesion. Gallbladder is unremarkable. No intra or extrahepatic biliary ductal dilatation.  Unremarkable appearance of the bilateral kidneys. No focal solid lesion, hydronephrosis or nephrolithiasis.  No evidence of obstruction or focal bowel wall thickening. Normal appendix in the right lower quadrant. The terminal ileum is unremarkable.  Pelvis: Unremarkable bladder, prostate gland and seminal vesicles. No free fluid or suspicious adenopathy.  Bones/Soft Tissues: No acute fracture or aggressive appearing lytic or  blastic osseous lesion.  Vascular: No significant atherosclerotic vascular disease, aneurysmal dilatation or acute abnormality.  IMPRESSION: 1. Circumferential wall thickening involving the descending duodenum with associated inflammatory change in the paraduodenal fat and pancreaticoduodenal groove. There is reactive thickening and fluid tracking along the anterior pararenal fascia. Differential considerations include infectious or inflammatory duodenitis, peptic ulcer disease, and potentially groove pancreatitis. 2. Probable hepatic steatosis.   Electronically Signed   By: Malachy Moan M.D.   On: 11/18/2013 21:49   Dg Abd Acute W/chest  11/20/2013   CLINICAL DATA:  Severe abdominal pain with vomiting  EXAM: ACUTE ABDOMEN SERIES (ABDOMEN 2 VIEW & CHEST 1 VIEW)  COMPARISON:  Prior CT from 11/18/2013  FINDINGS: Cardiac and mediastinal silhouettes are within normal limits.  Lungs are normally inflated. No airspace consolidation, pulmonary edema, pleural effusion, or pneumothorax.  Enteric contrast material seen within the ascending, transverse, and proximal descending colon, compatible with history of recent CT examination. No dilated loops of bowel to suggest obstruction or ileus. Single air-fluid level noted within the transverse colon on upper projection. No small bowel dilatation. No soft tissue mass or abnormal calcification. No free intraperitoneal air.  Osseous structures are within normal limits.  IMPRESSION: 1. Nonobstructive bowel gas pattern. 2. No acute cardiopulmonary disease.   Electronically  Signed   By: Rise Mu M.D.   On: 11/20/2013 03:45    Scheduled Meds: . insulin aspart  0-9 Units Subcutaneous TID WC  . pantoprazole (PROTONIX) IV  40 mg Intravenous Q24H   Continuous Infusions: . sodium chloride 125 mL/hr at 11/20/13 0744    Principal Problem:   Nausea & vomiting Active Problems:   Hyperglycemia    Time spent:     Esperanza Sheets  [facility] Pager [phone]. If 7PM-7AM, please contact night-coverage at [email or web address], password [facility] 11/20/2013, 10:58 AM  LOS: 0 days

## 2013-11-20 NOTE — H&P (Signed)
[facility] History and Physical  Brent Costa ZOX:[id number] DOB: 1991/01/27 DOA: 11/20/2013  Referring physician: ER physician.  PCP: Forest Becker, MD  Chief Complaint: Nausea vomiting abdominal pain.  HPI: Brent Costa is a 23 y.o. male with no significant past medical history presented to the ER because of persistent nausea vomiting abdominal pain. Patient has been having these symptoms since morning of November 18, 2013. Patient had come to the ER then had had CT abdomen pelvis done which showed features concerning for possible duodenitis versus peptic ulcer disease versus groove pancreatitis. When patient's pain was improved patient was discharged home but since discharge patient was getting significant pain with nausea vomiting. Patient states the pain is diffuse abdominal crampy in nature denies any diarrhea. Has been vomiting multiple times. Denies any blood in the vomitus. Acute abdominal series done does not show anything acute. Patient denies any chest pain or shortness of breath. He has been having some pain in right calf otherwise. His blood sugars been elevated patient also has elevated anion gap. Patient has been admitted for further workup.   Review of Systems: As presented in the history of presenting illness, rest negative.  Past Medical History  Diagnosis Date  . Bronchitis   . Hypertension   . Acid reflux    Past Surgical History  Procedure Laterality Date  . Tonsillectomy     Social History:  reports that he has been smoking.  He does not have any smokeless tobacco history on file. He reports that he does not drink alcohol or use illicit drugs. Where does patient live home. Can patient participate in ADLs? Yes.  No Known Allergies  Family History:  Family History  Problem Relation Age of Onset  . Diabetes Mellitus II Father   . Heart failure Father       Prior to Admission medications   Medication Sig Start Date End Date Taking?  Authorizing Provider  famotidine (PEPCID) 20 MG tablet Take 1 tablet (20 mg total) by mouth 2 (two) times daily. 11/19/13  Yes Gerhard Munch, MD  ondansetron (ZOFRAN ODT) 4 MG disintegrating tablet Take 1 tablet (4 mg total) by mouth every 8 (eight) hours as needed for nausea or vomiting. 11/19/13  Yes Gerhard Munch, MD  sucralfate (CARAFATE) 1 GM/10ML suspension Take 10 mLs (1 g total) by mouth 4 (four) times daily -  with meals and at bedtime. 11/19/13  Yes Gerhard Munch, MD    Physical Exam: Filed Vitals:   11/20/13 0222 11/20/13 0223 11/20/13 0230  BP: 152/83 152/83 143/82  Pulse:  109 119  Temp: 98.4 F (36.9 C) 98.7 F (37.1 C)   TempSrc: Oral Oral   Resp:  16   Height: 5\' 9"  (1.753 m)    Weight: 133.358 kg (294 lb)    SpO2: 97% 97% 95%     General:  Well-developed well-nourished.  Eyes: Anicteric no pallor.  ENT: No discharge from ears eyes nose mouth.  Neck: No mass felt.  Cardiovascular: S1-S2 heard.  Respiratory: No rhonchi or crepitations.  Abdomen: Mild tenderness in the epigastric area. No guarding or rigidity. No rebound tenderness.  Skin: No rash.  Musculoskeletal: No edema.  Psychiatric: Appears normal.  Neurologic: Alert and oriented to time place and person. Moves all extremities.  Labs on Admission:  Basic Metabolic Panel:  Recent Labs Lab 11/18/13 2130 11/18/13 2331 11/20/13 0240  NA 127* 127* 131*  K 7.2* 4.8 4.0  CL 90* 89* 91*  CO2 18* 22  21  GLUCOSE 268* 248* 279*  BUN 11 10 8   CREATININE 0.71 0.73 0.77  CALCIUM 8.7 8.8 9.5   Liver Function Tests:  Recent Labs Lab 11/18/13 2130 11/18/13 2331 11/20/13 0240  AST 106* 24 21  ALT 13 23 25   ALKPHOS 42 65 74  BILITOT 1.1 1.2 1.7*  PROT 7.8 7.2 8.3  ALBUMIN 4.0 3.8 4.0    Recent Labs Lab 11/18/13 2130 11/20/13 0240  LIPASE 48 18   No results found for this basename: AMMONIA,  in the last 168 hours CBC:  Recent Labs Lab 11/18/13 1923 11/20/13 0240  WBC 7.3  11.0*  NEUTROABS 5.1 8.6*  HGB 16.4 15.6  HCT 42.2 42.6  MCV 78.1 79.9  PLT 272 222   Cardiac Enzymes: No results found for this basename: CKTOTAL, CKMB, CKMBINDEX, TROPONINI,  in the last 168 hours  BNP (last 3 results) No results found for this basename: PROBNP,  in the last 8760 hours CBG: No results found for this basename: GLUCAP,  in the last 168 hours  Radiological Exams on Admission: Ct Abdomen Pelvis W Contrast  11/18/2013   CLINICAL DATA:  Generalized abdominal pain, nausea and body aches  EXAM: CT ABDOMEN AND PELVIS WITH CONTRAST  TECHNIQUE: Multidetector CT imaging of the abdomen and pelvis was performed using the standard protocol following bolus administration of intravenous contrast.  CONTRAST:  100mL OMNIPAQUE IOHEXOL 300 MG/ML  SOLN  COMPARISON:  None.  FINDINGS: Lower Chest: The lung bases are clear. Tiny 2 mm nodule in the periphery of the left lower lobe was almost certainly infectious/inflammatory/granulomatous. Visualized cardiac structures are within normal limits for size. No pericardial effusion. Unremarkable visualized distal thoracic esophagus.  Abdomen: Focal circumferential wall thickening of the descending (D2) segment of the duodenum with a blurring of the pancreaticoduodenal groove. There is a small amount of free fluid posterior to the duodenum along with thickening of the anterior para renal fascia. No extravasation of oral contrast material which opacifies the duodenum lumen. The pancreatic body and tail are unremarkable. The pancreatic head and uncinate process adjacent to the abnormal segment of the duodenum is somewhat less well defined and there may be trace peripancreatic stranding. The findings are most notable in the pancreaticoduodenal groove. Unremarkable appearance of the stomach, spleen, and adrenal glands. Mild hypoattenuation of the hepatic parenchyma suggests fatty infiltration. No discrete hepatic lesion. Gallbladder is unremarkable. No intra or  extrahepatic biliary ductal dilatation.  Unremarkable appearance of the bilateral kidneys. No focal solid lesion, hydronephrosis or nephrolithiasis.  No evidence of obstruction or focal bowel wall thickening. Normal appendix in the right lower quadrant. The terminal ileum is unremarkable.  Pelvis: Unremarkable bladder, prostate gland and seminal vesicles. No free fluid or suspicious adenopathy.  Bones/Soft Tissues: No acute fracture or aggressive appearing lytic or blastic osseous lesion.  Vascular: No significant atherosclerotic vascular disease, aneurysmal dilatation or acute abnormality.  IMPRESSION: 1. Circumferential wall thickening involving the descending duodenum with associated inflammatory change in the paraduodenal fat and pancreaticoduodenal groove. There is reactive thickening and fluid tracking along the anterior pararenal fascia. Differential considerations include infectious or inflammatory duodenitis, peptic ulcer disease, and potentially groove pancreatitis. 2. Probable hepatic steatosis.   Electronically Signed   By: Malachy MoanHeath  McCullough M.D.   On: 11/18/2013 21:49   Dg Abd Acute W/chest  11/20/2013   CLINICAL DATA:  Severe abdominal pain with vomiting  EXAM: ACUTE ABDOMEN SERIES (ABDOMEN 2 VIEW & CHEST 1 VIEW)  COMPARISON:  Prior CT from 11/18/2013  FINDINGS: Cardiac and mediastinal silhouettes are within normal limits.  Lungs are normally inflated. No airspace consolidation, pulmonary edema, pleural effusion, or pneumothorax.  Enteric contrast material seen within the ascending, transverse, and proximal descending colon, compatible with history of recent CT examination. No dilated loops of bowel to suggest obstruction or ileus. Single air-fluid level noted within the transverse colon on upper projection. No small bowel dilatation. No soft tissue mass or abnormal calcification. No free intraperitoneal air.  Osseous structures are within normal limits.  IMPRESSION: 1. Nonobstructive bowel gas  pattern. 2. No acute cardiopulmonary disease.   Electronically Signed   By: Rise Mu M.D.   On: 11/20/2013 03:45     Assessment/Plan Principal Problem:   Nausea & vomiting Active Problems:   Hyperglycemia   1. Nausea vomiting and abdominal pain - cause not clear. Could be viral gastroenteritis. CAT scan done yesterday showed possible duodenitis versus peptic ulcer disease versus groove pancreatitis. Since patient's bilirubin is mildly elevated I have ordered a sonogram of the abdomen for checking gallbladder. Patient has an elevated blood sugar but I don't think this is diabetic gastroparesis. For now I'm keeping patient n.p.o. until we get a sonogram of the abdomen. Continue with IV fluids and pain relief medications. Check urine drug screen for any marijuana 2 rule out cyclical vomiting syndrome. I have placed patient on Protonix. 2. Hyperglycemia - patient has elevated blood sugar and anion gap. I have ordered ABG and repeat basic metabolic panel. If anion gap is still elevated or ABG shows acidosis then we should start IV insulin to treat for possible DKA. I have also ordered acetone levels. For now I Patient on sliding-scale coverage until we get the repeat labs. Check hemoglobin A1c. 3. Tobacco abuse - patient advised to quit smoking.  Since patient is complaining of right calf pain have ordered Dopplers to rule out DVT. Pain could be from muscle cramps from electrolyte imbalance.  Code Status:  full code.   Family Communication:  family at the bedside.  Disposition Plan:  admit to inpatient.     Irie Fiorello N. [facility] Pager [phone].   If 7PM-7AM, please contact night-coverage [email or web address] Password [facility] 11/20/2013, 6:27 AM

## 2013-11-20 NOTE — Consult Note (Signed)
Subjective:   HPI  The patient is a 23 year old male who we are asked to see in consultation in regards to upper abdominal pain. He started having pain on February 14 and came to the emergency room for further evaluation. He had a CT scan which showed possible peptic duodenitis versus peptic ulcer disease versus groove pancreatitis. His lipase however was normal. He denies drinking alcohol. He had an abdominal ultrasound which did not show gallstones. He denies using aspirin or NSAIDs on a regular basis. He was discharged originally from the emergency room but came back because of persistent pain and was admitted.  Review of Systems Denies chest pain or shortness of breath.  Past Medical History  Diagnosis Date  . Hypertension   . Acid reflux   . Chronic bronchitis     "get it about q year" (11/20/2013)   Past Surgical History  Procedure Laterality Date  . Tonsillectomy and adenoidectomy  ~ 1999   History   Social History  . Marital Status: Single    Spouse Name: N/A    Number of Children: N/A  . Years of Education: N/A   Occupational History  . Not on file.   Social History Main Topics  . Smoking status: Current Every Day Smoker -- 0.50 packs/day for 3 years    Types: Cigarettes  . Smokeless tobacco: Not on file  . Alcohol Use: Yes     Comment: 11/20/2013 "only drink at birthdays, parties, holidays"  . Drug Use: Yes    Special: Marijuana     Comment: 11/20/2013 "used to use marijuana; used it last < 1 month ago"  . Sexual Activity: Yes   Other Topics Concern  . Not on file   Social History Narrative  . No narrative on file   family history includes Diabetes Mellitus II in his father; Heart failure in his father. Current facility-administered medications:0.9 %  sodium chloride infusion, , Intravenous, Continuous, Eduard ClosArshad N Kakrakandy, MD, Last Rate: 125 mL/hr at 11/20/13 0744;  acetaminophen (TYLENOL) suppository 650 mg, 650 mg, Rectal, Q6H PRN, Eduard ClosArshad N Kakrakandy, MD;   acetaminophen (TYLENOL) tablet 650 mg, 650 mg, Oral, Q6H PRN, Eduard ClosArshad N Kakrakandy, MD;  antiseptic oral rinse (BIOTENE) solution 15 mL, 15 mL, Mouth Rinse, q12n4p, Esperanza SheetsUlugbek N Buriev, MD chlorhexidine (PERIDEX) 0.12 % solution 15 mL, 15 mL, Mouth Rinse, BID, Esperanza SheetsUlugbek N Buriev, MD;  insulin aspart (novoLOG) injection 0-9 Units, 0-9 Units, Subcutaneous, TID WC, Eduard ClosArshad N Kakrakandy, MD, 3 Units at 11/20/13 1219;  morphine 2 MG/ML injection 1 mg, 1 mg, Intravenous, Q4H PRN, Eduard ClosArshad N Kakrakandy, MD, 1 mg at 11/20/13 1220;  ondansetron (ZOFRAN) injection 4 mg, 4 mg, Intravenous, Q6H PRN, Eduard ClosArshad N Kakrakandy, MD ondansetron (ZOFRAN) tablet 4 mg, 4 mg, Oral, Q6H PRN, Eduard ClosArshad N Kakrakandy, MD;  pantoprazole (PROTONIX) injection 40 mg, 40 mg, Intravenous, Q24H, Eduard ClosArshad N Kakrakandy, MD, 40 mg at 11/20/13 1047 No Known Allergies   Objective:     BP 140/66  Pulse 104  Temp(Src) 98.2 F (36.8 C) (Oral)  Resp 18  Ht 5\' 9"  (1.753 m)  Wt 133.358 kg (294 lb)  BMI 43.40 kg/m2  SpO2 96%  He is in no distress  Nonicteric  Heart regular rhythm no murmurs  Lungs clear  Abdomen: Bowel sounds present, soft, there is some tenderness in the epigastrium but no rebound or guarding  Laboratory No components found with this basename: d1      Assessment:     Epigastric pain, probably related to  peptic ulcer disease  CT scan was reviewed        Plan:     I would allow him clear liquids right now. We will proceed with EGD tomorrow. PPI therapy for the time being.    Component Value Date/Time   WBC 9.5 11/20/2013 1005   HGB 13.9 11/20/2013 1005   HCT 39.3 11/20/2013 1005   PLT 197 11/20/2013 1005   ALT 20 11/20/2013 1005   AST 14 11/20/2013 1005   NA 137 11/20/2013 1005   K 4.0 11/20/2013 1005   CL 99 11/20/2013 1005   CREATININE 0.80 11/20/2013 1005   BUN 8 11/20/2013 1005   CO2 23 11/20/2013 1005   CALCIUM 8.9 11/20/2013 1005   ALKPHOS 66 11/20/2013 1005    Lab Results  Component Value Date   HGB 13.9  11/20/2013   HGB 15.6 11/20/2013   HGB 16.4 11/18/2013   HCT 39.3 11/20/2013   HCT 42.6 11/20/2013   HCT 42.2 11/18/2013   ALKPHOS 66 11/20/2013   ALKPHOS 74 11/20/2013   ALKPHOS 65 11/18/2013   AST 14 11/20/2013   AST 21 11/20/2013   AST 24 11/18/2013   ALT 20 11/20/2013   ALT 25 11/20/2013   ALT 23 11/18/2013

## 2013-11-21 ENCOUNTER — Encounter (HOSPITAL_COMMUNITY)
Admission: EM | Disposition: A | Discharge status: Home / Self Care | Payer: Self-pay | Source: Home / Self Care | Attending: Internal Medicine

## 2013-11-21 ENCOUNTER — Encounter (HOSPITAL_COMMUNITY): Payer: Self-pay | Admitting: *Deleted

## 2013-11-21 DIAGNOSIS — E118 Type 2 diabetes mellitus with unspecified complications: Secondary | ICD-10-CM

## 2013-11-21 HISTORY — PX: ESOPHAGOGASTRODUODENOSCOPY: SHX5428

## 2013-11-21 LAB — COMPREHENSIVE METABOLIC PANEL
ALBUMIN: 3 g/dL — AB (ref 3.5–5.2)
ALT: 17 U/L (ref 0–53)
AST: 15 U/L (ref 0–37)
Alkaline Phosphatase: 61 U/L (ref 39–117)
BUN: 8 mg/dL (ref 6–23)
CALCIUM: 8.8 mg/dL (ref 8.4–10.5)
CO2: 23 mEq/L (ref 19–32)
CREATININE: 0.76 mg/dL (ref 0.50–1.35)
Chloride: 98 mEq/L (ref 96–112)
GFR calc non Af Amer: >90 mL/min (ref >90)
GLUCOSE: 203 mg/dL — AB (ref 70–99)
Potassium: 3.8 mEq/L (ref 3.7–5.3)
Sodium: 135 mEq/L — ABNORMAL LOW (ref 137–147)
TOTAL PROTEIN: 6.9 g/dL (ref 6.0–8.3)
Total Bilirubin: 1.1 mg/dL (ref 0.3–1.2)

## 2013-11-21 LAB — CBC
HCT: 36.7 % — ABNORMAL LOW (ref 39.0–52.0)
HEMOGLOBIN: 12.7 g/dL — AB (ref 13.0–17.0)
MCH: 28.3 pg (ref 26.0–34.0)
MCHC: 34.6 g/dL (ref 30.0–36.0)
MCV: 81.7 fL (ref 78.0–100.0)
Platelets: 179 10*3/uL (ref 150–400)
RBC: 4.49 MIL/uL (ref 4.22–5.81)
RDW: 13.2 % (ref 11.5–15.5)
WBC: 9.3 10*3/uL (ref 4.0–10.5)

## 2013-11-21 LAB — GLUCOSE, CAPILLARY
GLUCOSE-CAPILLARY: 206 mg/dL — AB (ref 70–99)
GLUCOSE-CAPILLARY: 242 mg/dL — AB (ref 70–99)
Glucose-Capillary: 193 mg/dL — ABNORMAL HIGH (ref 70–99)
Glucose-Capillary: 171 mg/dL — ABNORMAL HIGH (ref 70–99)
Glucose-Capillary: 198 mg/dL — ABNORMAL HIGH (ref 70–99)
Glucose-Capillary: 255 mg/dL — ABNORMAL HIGH (ref 70–99)

## 2013-11-21 SURGERY — EGD (ESOPHAGOGASTRODUODENOSCOPY)
Anesthesia: Moderate Sedation

## 2013-11-21 MED ORDER — SODIUM CHLORIDE 0.9 % IV SOLN
INTRAVENOUS | Status: DC
  Administered 2013-11-21: 10:00:00 via INTRAVENOUS

## 2013-11-21 MED ORDER — LIVING WELL WITH DIABETES BOOK
Freq: Once | Status: AC
  Administered 2013-11-21: 18:00:00
  Filled 2013-11-21: qty 1

## 2013-11-21 MED ORDER — BUTAMBEN-TETRACAINE-BENZOCAINE 2-2-14 % EX AERO
INHALATION_SPRAY | CUTANEOUS | Status: DC | PRN
  Administered 2013-11-21: 2 via TOPICAL

## 2013-11-21 MED ORDER — METRONIDAZOLE IN NACL 5-0.79 MG/ML-% IV SOLN
500.0000 mg | Freq: Three times a day (TID) | INTRAVENOUS | Status: DC
  Administered 2013-11-21 – 2013-11-23 (×6): 500 mg via INTRAVENOUS
  Filled 2013-11-21 (×9): qty 100

## 2013-11-21 MED ORDER — FENTANYL CITRATE 0.05 MG/ML IJ SOLN
INTRAMUSCULAR | Status: AC
  Filled 2013-11-21: qty 2

## 2013-11-21 MED ORDER — INSULIN GLARGINE 100 UNIT/ML ~~LOC~~ SOLN
10.0000 [IU] | Freq: Every day | SUBCUTANEOUS | Status: DC
Start: 2013-11-21 — End: 2013-11-24
  Administered 2013-11-21 – 2013-11-23 (×3): 10 [IU] via SUBCUTANEOUS
  Filled 2013-11-21 (×4): qty 0.1

## 2013-11-21 MED ORDER — MIDAZOLAM HCL 10 MG/2ML IJ SOLN
INTRAMUSCULAR | Status: DC | PRN
  Administered 2013-11-21 (×2): 2 mg via INTRAVENOUS
  Administered 2013-11-21 (×2): 1 mg via INTRAVENOUS

## 2013-11-21 MED ORDER — INSULIN PEN STARTER KIT
1.0000 | Freq: Once | Status: AC
  Administered 2013-11-21: 1
  Filled 2013-11-21: qty 1

## 2013-11-21 MED ORDER — LEVOFLOXACIN IN D5W 750 MG/150ML IV SOLN
750.0000 mg | INTRAVENOUS | Status: DC
  Administered 2013-11-21 – 2013-11-23 (×3): 750 mg via INTRAVENOUS
  Filled 2013-11-21 (×3): qty 150

## 2013-11-21 MED ORDER — SODIUM CHLORIDE 0.9 % IV SOLN
INTRAVENOUS | Status: DC
  Administered 2013-11-21 – 2013-11-24 (×5): via INTRAVENOUS

## 2013-11-21 MED ORDER — FENTANYL CITRATE 0.05 MG/ML IJ SOLN
INTRAMUSCULAR | Status: DC | PRN
  Administered 2013-11-21 (×2): 25 ug via INTRAVENOUS

## 2013-11-21 MED ORDER — DIPHENHYDRAMINE HCL 50 MG/ML IJ SOLN
INTRAMUSCULAR | Status: AC
  Filled 2013-11-21: qty 1

## 2013-11-21 MED ORDER — MIDAZOLAM HCL 5 MG/ML IJ SOLN
INTRAMUSCULAR | Status: AC
  Filled 2013-11-21: qty 2

## 2013-11-21 NOTE — Clinical Documentation Improvement (Signed)
Possible Clinical conditions  Morbid Obesity W/ BMI 43.40 kg/m2    Other condition___________________  Cannot clinically determine _____________  Risk Factors: Per 2/16  ED & 2/17 consult note: BMI 43.40 kg/m2 .  Thank You, Shelda Palarlene H Cooper ,RN Clinical Documentation Specialist:  [phone]  [facility]- [facility] Information Management

## 2013-11-21 NOTE — Progress Notes (Signed)
11/21/13 Attempted to talk with patient regarding the new dx of DM and HgbA1C result.  Pt on side and attempted to open his eyes but his uncle states that he has just gotten pain medication. Uncle "Ron" states that pt has been in tremendous pain before and since admission. Uncle has stayed with him since he went to the ED.    Although pt has not been in acidosis, noted that pt was positive for ketones in his urine on admission times 2.  Urine ketone Level was at 40, but certainly ketones can present or add to symptoms of abdominal pain, nausea and vomiting. Pt would have needed several boluses of NS IV fluids and then IV insulin to clear the ketones. Pt has gotten IV normal saline, but cannot find documentation of any dextrose added to IV fluids.  Although pt has been ordered clear fluids and to advance to full liquids tomorrow, po intake is documented at 0%  Recommendations: Please check another urinalysis to check for ketones. (With no dextrose in IV fluids nor po intake of any dextrose, pt may still be developing ketones). D5NS at 125/hr would not significantly increase the blood glucose levels, but the dextrose may assist in preventing any further ketones if present. Will follow and assess ed'l needs again tomorrow.  His uncle will need to be involved with education. Thank you, Lenor CoffinAnn Collan Schoenfeld, RN, CNS, Diabetes Coordinator [phone])

## 2013-11-21 NOTE — Progress Notes (Deleted)
During my 1800 medpass , upon entering patient room, patient started moaning and claiming she is hurting and claiming that she thinks she is starting to have her pain crisis again,she stated that her legs hurt after she feel in the bathroom and when this writer asked when did she fell she said about an hour ago, I asked her if she called for help and patient stated she pull her light and asked for help, This Clinical research associatewriter was not notified, Geographical information systems officernursing secretary stated that there were no such call. Bilateral legs were okey, ROM wnl,no swelling, no bruising noted upon assessment. Charge nurse made aware, MD notified.

## 2013-11-21 NOTE — Progress Notes (Signed)
Patient back from endoscopy, alert but sleepy, vital signs stable. Will continue to monitor.

## 2013-11-21 NOTE — Op Note (Signed)
[facility] [address] [place], [id number]   ENDOSCOPY PROCEDURE REPORT  PATIENT: Brent Costa, Brent A.  MR#: [id number] BIRTHDATE: Nov 10, 1990 , 22  yrs. old GENDER: Male ENDOSCOPIST: Wandalee FerdinandSam Aribella Vavra, MD REFERRED BY: PROCEDURE DATE:  11/21/2013 PROCEDURE:   EGD ASA CLASS: 2 INDICATIONS: epigastric pain MEDICATIONS: fentanyl 50 mcg IV, Versed 6 mg IV TOPICAL ANESTHETIC: Cetacaine spray to the oropharynx  DESCRIPTION OF PROCEDURE:   After the risks benefits and alternatives of the procedure were thoroughly explained, informed consent was obtained.  The Pentax Gastroscope [id number]  endoscope was introduced through the mouth and advanced to the third portion of the duodenum      , limited by Without limitations.   The instrument was slowly withdrawn as the mucosa was fully examined.      FINDINGS:  Esophagus: Normal  Stomach: Normal  Duodenum: Normal, I see no evidence of ulcers or inflammation in the duodenum as mentioned on CT scan  There is nothing on this examination to explain the patient's epigastric pain  COMPLICATIONS:none  ENDOSCOPIC IMPRESSION:see above   RECOMMENDATIONS:advanced diet, continue PPI therapy, follow clinically     _______________________________ Rosalie DoctoreSignedWandalee Ferdinand:  Sam Sacha Topor, MD 11/21/2013 11:20 AM       PATIENT NAME:  Brent Costa, Brent A. MR#: [id number]

## 2013-11-21 NOTE — Progress Notes (Signed)
[facility] PROGRESS NOTE  Gustavo LahShrone A Scheid EAV:[id number] DOB: 13-Dec-1990 DOA: 11/20/2013 PCP: Forest BeckerJENNINGS, JESSICA LYNNE, MD  Assessment/Plan: Principal Problem:  Nausea & vomiting  Active Problems:  Hyperglycemia  23 y/o male with no significant past medical history presented to the ER because of persistent nausea vomiting abdominal pain  1. Nausea vomiting and abdominal pain of unclear etiology; CT: Circumferential wall thickening involving the descending duodenum with associated inflammatory change in the paraduodenal fat and pancreaticoduodenal groove -s/p EGD: no PUD;  -febrile, tachycardic 2/16; start empiric atx to cover GI flora; cont IVF, entiemetics; pain control; PPI, check H pylori   2. New Dx: DM; HA1C-10.3; start insulin regimen while in hospital; DM education;   3. Substance abuse; d/w recommended to quit using drugs   4. Mild Hyperbilirubinemia; US abd: No evidence of cholelithiasis or biliary obstruction -recheck in AM; resolving    Code Status: full Family Communication: d/w patient, brother,  (indicate person spoken with, relationship, and if by phone, the number) Disposition Plan: home 24-48 hours    Consultants: GI  Procedures:  None   Antibiotics:  None  (indicate start date, and stop date if known)  HPI/Subjective: alert  Objective: Filed Vitals:   11/21/13 1202  BP: 142/69  Pulse: 107  Temp: 99 F (37.2 C)  Resp: 26    Intake/Output Summary (Last 24 hours) at 11/21/13 1314 Last data filed at 11/21/13 0555  Gross per 24 hour  Intake 3012.91 ml  Output      0 ml  Net 3012.91 ml   Filed Weights   11/20/13 0222 11/21/13 0555  Weight: 133.358 kg (294 lb) 133.221 kg (293 lb 11.2 oz)    Exam:   General:  alert  Cardiovascular: s1,s2 rrr  Respiratory: CTA BL  Abdomen: soft, mild epigatric tender, nd   Musculoskeletal: no LE edema   Data Reviewed: Basic Metabolic Panel:  Recent Labs Lab 11/18/13 2130 11/18/13 2331  11/20/13 0240 11/20/13 1005 11/21/13 0435  NA 127* 127* 131* 137 135*  K 7.2* 4.8 4.0 4.0 3.8  CL 90* 89* 91* 99 98  CO2 18* 22 21 23 23   GLUCOSE 268* 248* 279* 234* 203*  BUN 11 10 8 8 8   CREATININE 0.71 0.73 0.77 0.80 0.76  CALCIUM 8.7 8.8 9.5 8.9 8.8   Liver Function Tests:  Recent Labs Lab 11/18/13 2130 11/18/13 2331 11/20/13 0240 11/20/13 1005 11/21/13 0435  AST 106* 24 21 14 15   ALT 13 23 25 20 17   ALKPHOS 42 65 74 66 61  BILITOT 1.1 1.2 1.7* 1.3* 1.1  PROT 7.8 7.2 8.3 7.3 6.9  ALBUMIN 4.0 3.8 4.0 3.3* 3.0*    Recent Labs Lab 11/18/13 2130 11/20/13 0240  LIPASE 48 18   No results found for this basename: AMMONIA,  in the last 168 hours CBC:  Recent Labs Lab 11/18/13 1923 11/20/13 0240 11/20/13 1005 11/21/13 0435  WBC 7.3 11.0* 9.5 9.3  NEUTROABS 5.1 8.6* 7.3  --   HGB 16.4 15.6 13.9 12.7*  HCT 42.2 42.6 39.3 36.7*  MCV 78.1 79.9 80.5 81.7  PLT 272 222 197 179   Cardiac Enzymes: No results found for this basename: CKTOTAL, CKMB, CKMBINDEX, TROPONINI,  in the last 168 hours BNP (last 3 results) No results found for this basename: PROBNP,  in the last 8760 hours CBG:  Recent Labs Lab 11/20/13 2016 11/21/13 0016 11/21/13 0420 11/21/13 0808 11/21/13 1208  GLUCAP 241* 242* 193* 206* 171*    No results  found for this or any previous visit (from the past 240 hour(s)).   Studies: US Abdomen Complete  11/20/2013   CLINICAL DATA:  Abdominal pain and elevated bili Rubin.  EXAM: ULTRASOUND ABDOMEN COMPLETE  COMPARISON:  None.  FINDINGS: Gallbladder:  No gallstones or wall thickening visualized. No sonographic Murphy sign noted.  Common bile duct:  Diameter: 5 mm, where seen.  No evidence of filling defect.  Liver:  The liver is diffusely echogenic with poor acoustic transmission. No focal abnormality is seen.Antegrade flow in the imaged portal venous system.  IVC:  Limited visualization.  Pancreas:  Limited visualization due to small acoustic windows.   Spleen:  Size and appearance within normal limits.  Right Kidney:  Length: 13 cm. Echogenicity within normal limits. No mass or hydronephrosis visualized.  Left Kidney:  Length: 12 cm. Echogenicity within normal limits. No mass or hydronephrosis visualized.  Abdominal aorta:  No aneurysm visualized.  Other findings:  None.  IMPRESSION: 1. No evidence of cholelithiasis or biliary obstruction. 2. Fatty liver.   Electronically Signed   By: Tiburcio Pea M.D.   On: 11/20/2013 07:13   Dg Abd Acute W/chest  11/20/2013   CLINICAL DATA:  Severe abdominal pain with vomiting  EXAM: ACUTE ABDOMEN SERIES (ABDOMEN 2 VIEW & CHEST 1 VIEW)  COMPARISON:  Prior CT from 11/18/2013  FINDINGS: Cardiac and mediastinal silhouettes are within normal limits.  Lungs are normally inflated. No airspace consolidation, pulmonary edema, pleural effusion, or pneumothorax.  Enteric contrast material seen within the ascending, transverse, and proximal descending colon, compatible with history of recent CT examination. No dilated loops of bowel to suggest obstruction or ileus. Single air-fluid level noted within the transverse colon on upper projection. No small bowel dilatation. No soft tissue mass or abnormal calcification. No free intraperitoneal air.  Osseous structures are within normal limits.  IMPRESSION: 1. Nonobstructive bowel gas pattern. 2. No acute cardiopulmonary disease.   Electronically Signed   By: Rise Mu M.D.   On: 11/20/2013 03:45    Scheduled Meds: . antiseptic oral rinse  15 mL Mouth Rinse q12n4p  . chlorhexidine  15 mL Mouth Rinse BID  . insulin aspart  0-9 Units Subcutaneous TID WC  . pantoprazole (PROTONIX) IV  40 mg Intravenous Q24H   Continuous Infusions:    Principal Problem:   Nausea & vomiting Active Problems:   Hyperglycemia    Time spent:     Esperanza Sheets  [facility] Pager [phone]. If 7PM-7AM, please contact night-coverage at [email or web address], password  [facility] 11/21/2013, 1:14 PM  LOS: 1 day

## 2013-11-21 NOTE — Progress Notes (Signed)
INITIAL NUTRITION ASSESSMENT  DOCUMENTATION CODES Per approved criteria  -Morbid Obesity   INTERVENTION: Diet advancement per MD Encourage PO intake, small meals RD to follow-up to provide diet education if appropriate  NUTRITION DIAGNOSIS: Inadequate oral intake related to nausea and vomiting as evidenced by pt's report.   Goal: Pt to meet >/= 90% of their estimated nutrition needs   Monitor:  Diet advancement/PO intake, weight trends, labs  Reason for Assessment: Malnutrition Screening Tool, score of 3  23 y.o. male  Admitting Dx: Nausea & vomiting  ASSESSMENT: 23 y.o. male with no significant past medical history presented to the ER because of persistent nausea vomiting abdominal pain. Patient has been having these symptoms since morning of November 18, 2013. Patient had come to the ER then had had CT abdomen pelvis done which showed features concerning for possible duodenitis versus peptic ulcer disease versus groove pancreatitis. When patient's pain was improved patient was discharged home but since discharge patient was getting significant pain with nausea vomiting.  Per MST in chart, pt reported 16 lbs weight loss in the past 3 months; 5% wt loss not significant for time frame. Pt reports that he usually weighs 300 lbs and weight loss is related to decreased appetite. Pt states his normal routine is to eat one large meal daily before going to bed. Encouraged pt to eat smaller meals more frequently throughout the day. Pt states he has tolerated PO intake so far today. RD to continue to monitor; will follow-up to provide diet education if appropriate for diagnosis.   Height: Ht Readings from Last 1 Encounters:  11/20/13 5\' 9"  (1.753 m)    Weight: Wt Readings from Last 1 Encounters:  11/21/13 293 lb 11.2 oz (133.221 kg)    Ideal Body Weight: 160 lbs  % Ideal Body Weight: 183%  Wt Readings from Last 10 Encounters:  11/21/13 293 lb 11.2 oz (133.221 kg)  11/21/13 293  lb 11.2 oz (133.221 kg)  11/18/13 294 lb (133.358 kg)    Usual Body Weight: 300 lbs  % Usual Body Weight: 98%  BMI:  Body mass index is 43.35 kg/(m^2).  Estimated Nutritional Needs: Kcal: 2200-2400 Protein: 110-130 grams Fluid: 3.1 L/day  Skin: WDL  Diet Order: Full Liquid  EDUCATION NEEDS: -Education not appropriate at this time   Intake/Output Summary (Last 24 hours) at 11/21/13 1233 Last data filed at 11/21/13 0555  Gross per 24 hour  Intake 3012.91 ml  Output      0 ml  Net 3012.91 ml    Last BM: 2/13   Labs:   Recent Labs Lab 11/20/13 0240 11/20/13 1005 11/21/13 0435  NA 131* 137 135*  K 4.0 4.0 3.8  CL 91* 99 98  CO2 21 23 23   BUN 8 8 8   CREATININE 0.77 0.80 0.76  CALCIUM 9.5 8.9 8.8  GLUCOSE 279* 234* 203*    CBG (last 3)   Recent Labs  11/21/13 0420 11/21/13 0808 11/21/13 1208  GLUCAP 193* 206* 171*    Scheduled Meds: . antiseptic oral rinse  15 mL Mouth Rinse q12n4p  . chlorhexidine  15 mL Mouth Rinse BID  . insulin aspart  0-9 Units Subcutaneous TID WC  . pantoprazole (PROTONIX) IV  40 mg Intravenous Q24H    Continuous Infusions:   Past Medical History  Diagnosis Date  . Hypertension   . Acid reflux   . Chronic bronchitis     "get it about q year" (11/20/2013)    Past Surgical History  Procedure Laterality Date  . Tonsillectomy and adenoidectomy  ~ 1999    Ian Malkineanne Barnett RD, LDN Inpatient Clinical Dietitian Pager: [phone] After Hours Pager: [phone]

## 2013-11-21 NOTE — ED Provider Notes (Signed)
CSN: [id number]     Arrival date & time 11/20/13  0209 History   First MD Initiated Contact with Patient 11/20/13 0232     Chief Complaint  Patient presents with  . Abdominal Pain     (Consider location/radiation/quality/duration/timing/severity/associated sxs/prior Treatment) HPI Comments: 23 y.o. male with no significant past medical history presented to the ER because of persistent nausea vomiting abdominal pain. Patient has been having these symptoms over the past few days and was seen in the ER -  had CT abdomen pelvis done which showed features concerning for possible duodenitis versus peptic ulcer disease versus groove pancreatitis - and discharged home with meds for gastro.  Pt's pain has since returned and is more severe, and is associated with immediate post prandial emesis. Pt has poor po intake.  Patient is a 23 y.o. male presenting with abdominal pain. The history is provided by the patient.  Abdominal Pain Associated symptoms: nausea and vomiting   Associated symptoms: no chest pain, no cough, no dysuria and no shortness of breath     Past Medical History  Diagnosis Date  . Hypertension   . Acid reflux   . Chronic bronchitis     "get it about q year" (11/20/2013)   Past Surgical History  Procedure Laterality Date  . Tonsillectomy and adenoidectomy  ~ 1999   Family History  Problem Relation Age of Onset  . Diabetes Mellitus II Father   . Heart failure Father    History  Substance Use Topics  . Smoking status: Current Every Day Smoker -- 0.50 packs/day for 3 years    Types: Cigarettes  . Smokeless tobacco: Not on file  . Alcohol Use: Yes     Comment: 11/20/2013 "only drink at birthdays, parties, holidays"    Review of Systems  Constitutional: Negative for activity change and appetite change.  Respiratory: Negative for cough and shortness of breath.   Cardiovascular: Negative for chest pain.  Gastrointestinal: Positive for nausea, vomiting and abdominal pain.   Genitourinary: Negative for dysuria.  Neurological: Positive for light-headedness.  All other systems reviewed and are negative.      Allergies  Review of patient's allergies indicates no known allergies.  Home Medications  No current outpatient prescriptions on file. BP 146/70  Pulse 104  Temp(Src) 99 F (37.2 C) (Oral)  Resp 20  Ht 5\' 9"  (1.753 m)  Wt 294 lb (133.358 kg)  BMI 43.40 kg/m2  SpO2 93% Physical Exam  Nursing note and vitals reviewed. Constitutional: He is oriented to person, place, and time. He appears well-developed.  HENT:  Head: Normocephalic and atraumatic.  Eyes: Conjunctivae and EOM are normal. Pupils are equal, round, and reactive to light.  Neck: Normal range of motion. Neck supple.  Cardiovascular: Normal rate and regular rhythm.   Pulmonary/Chest: Effort normal and breath sounds normal.  Abdominal: Soft. Bowel sounds are normal. He exhibits no distension. There is tenderness. There is guarding. There is no rebound.  Diffuse tenderness -worst over the epigastrium.  Neurological: He is alert and oriented to person, place, and time.  Skin: Skin is warm.    ED Course  Procedures (including critical care time) Labs Review Labs Reviewed  COMPREHENSIVE METABOLIC PANEL - Abnormal; Notable for the following:    Sodium 131 (*)    Chloride 91 (*)    Glucose, Bld 279 (*)    Total Bilirubin 1.7 (*)    All other components within normal limits  CBC WITH DIFFERENTIAL - Abnormal; Notable for the  following:    WBC 11.0 (*)    MCHC 36.6 (*)    Neutrophils Relative % 78 (*)    Neutro Abs 8.6 (*)    All other components within normal limits  URINALYSIS, ROUTINE W REFLEX MICROSCOPIC - Abnormal; Notable for the following:    Color, Urine AMBER (*)    Specific Gravity, Urine >1.030 (*)    Glucose, UA >1000 (*)    Hgb urine dipstick LARGE (*)    Ketones, ur 40 (*)    Protein, ur >300 (*)    All other components within normal limits  BASIC METABOLIC PANEL  - Abnormal; Notable for the following:    Glucose, Bld 234 (*)    All other components within normal limits  HEPATIC FUNCTION PANEL - Abnormal; Notable for the following:    Albumin 3.3 (*)    Total Bilirubin 1.3 (*)    Indirect Bilirubin 1.1 (*)    All other components within normal limits  HEMOGLOBIN A1C - Abnormal; Notable for the following:    Hemoglobin A1C 10.3 (*)    Mean Plasma Glucose 249 (*)    All other components within normal limits  KETONES, QUALITATIVE - Abnormal; Notable for the following:    Acetone, Bld SMALL (*)    All other components within normal limits  URINE RAPID DRUG SCREEN (HOSP PERFORMED) - Abnormal; Notable for the following:    Opiates POSITIVE (*)    Tetrahydrocannabinol POSITIVE (*)    All other components within normal limits  GLUCOSE, CAPILLARY - Abnormal; Notable for the following:    Glucose-Capillary 242 (*)    All other components within normal limits  GLUCOSE, CAPILLARY - Abnormal; Notable for the following:    Glucose-Capillary 233 (*)    All other components within normal limits  GLUCOSE, CAPILLARY - Abnormal; Notable for the following:    Glucose-Capillary 201 (*)    All other components within normal limits  CBC - Abnormal; Notable for the following:    Hemoglobin 12.7 (*)    HCT 36.7 (*)    All other components within normal limits  GLUCOSE, CAPILLARY - Abnormal; Notable for the following:    Glucose-Capillary 241 (*)    All other components within normal limits  GLUCOSE, CAPILLARY - Abnormal; Notable for the following:    Glucose-Capillary 242 (*)    All other components within normal limits  GLUCOSE, CAPILLARY - Abnormal; Notable for the following:    Glucose-Capillary 193 (*)    All other components within normal limits  H.PYLORI ANTIGEN, STOOL  LIPASE, BLOOD  URINE MICROSCOPIC-ADD ON  CBC WITH DIFFERENTIAL  TSH  BLOOD GAS, ARTERIAL  COMPREHENSIVE METABOLIC PANEL  CG4 I-STAT (LACTIC ACID)   Imaging Review US Abdomen  Complete  11/20/2013   CLINICAL DATA:  Abdominal pain and elevated bili Rubin.  EXAM: ULTRASOUND ABDOMEN COMPLETE  COMPARISON:  None.  FINDINGS: Gallbladder:  No gallstones or wall thickening visualized. No sonographic Murphy sign noted.  Common bile duct:  Diameter: 5 mm, where seen.  No evidence of filling defect.  Liver:  The liver is diffusely echogenic with poor acoustic transmission. No focal abnormality is seen.Antegrade flow in the imaged portal venous system.  IVC:  Limited visualization.  Pancreas:  Limited visualization due to small acoustic windows.  Spleen:  Size and appearance within normal limits.  Right Kidney:  Length: 13 cm. Echogenicity within normal limits. No mass or hydronephrosis visualized.  Left Kidney:  Length: 12 cm. Echogenicity within normal limits. No  mass or hydronephrosis visualized.  Abdominal aorta:  No aneurysm visualized.  Other findings:  None.  IMPRESSION: 1. No evidence of cholelithiasis or biliary obstruction. 2. Fatty liver.   Electronically Signed   By: Tiburcio Pea M.D.   On: 11/20/2013 07:13   Dg Abd Acute W/chest  11/20/2013   CLINICAL DATA:  Severe abdominal pain with vomiting  EXAM: ACUTE ABDOMEN SERIES (ABDOMEN 2 VIEW & CHEST 1 VIEW)  COMPARISON:  Prior CT from 11/18/2013  FINDINGS: Cardiac and mediastinal silhouettes are within normal limits.  Lungs are normally inflated. No airspace consolidation, pulmonary edema, pleural effusion, or pneumothorax.  Enteric contrast material seen within the ascending, transverse, and proximal descending colon, compatible with history of recent CT examination. No dilated loops of bowel to suggest obstruction or ileus. Single air-fluid level noted within the transverse colon on upper projection. No small bowel dilatation. No soft tissue mass or abnormal calcification. No free intraperitoneal air.  Osseous structures are within normal limits.  IMPRESSION: 1. Nonobstructive bowel gas pattern. 2. No acute cardiopulmonary disease.    Electronically Signed   By: Rise Mu M.D.   On: 11/20/2013 03:45      MDM   Final diagnoses:  Hyperglycemia  Abdominal pain    Pt w/ new onset abd pain. Recent CT scan showed PUD vs. Phillips Odor like picture -and he was discharged home with return precautions and pain meds. Pain returned, with emesis. Pt is tachycardic, still has abd tenderness - no bloody stools or emesis. Pt is dehydrated based on sx.  Admit to medicine for obs. I think he might need GI consultation and hydration and possible further evaluation for this pain.   Derwood Kaplan, MD 11/21/13 [phone]

## 2013-11-22 ENCOUNTER — Encounter (HOSPITAL_COMMUNITY): Payer: Self-pay | Admitting: Gastroenterology

## 2013-11-22 LAB — GLUCOSE, CAPILLARY
GLUCOSE-CAPILLARY: 210 mg/dL — AB (ref 70–99)
Glucose-Capillary: 146 mg/dL — ABNORMAL HIGH (ref 70–99)
Glucose-Capillary: 172 mg/dL — ABNORMAL HIGH (ref 70–99)
Glucose-Capillary: 189 mg/dL — ABNORMAL HIGH (ref 70–99)
Glucose-Capillary: 190 mg/dL — ABNORMAL HIGH (ref 70–99)
Glucose-Capillary: 237 mg/dL — ABNORMAL HIGH (ref 70–99)

## 2013-11-22 NOTE — Progress Notes (Signed)
  RD consulted for nutrition education regarding diabetes.   Lab Results  Component Value Date   HGBA1C 10.3* 11/20/2013    RD provided "Carbohydrate Counting for People with Diabetes" handout from the Academy of Nutrition and Dietetics. Discussed different food groups and their effects on blood sugar, emphasizing carbohydrate-containing foods. Provided list of carbohydrates and recommended serving sizes of common foods.  Pt reports eating one large meal daily PTA and drinking a lot of sweet tea. Discussed importance of controlled and consistent carbohydrate intake throughout the day. Provided examples of ways to balance meals/snacks and encouraged intake of high-fiber, whole grain complex carbohydrates. Teach back method used.  Expect good compliance.  Body mass index is 42.79 kg/(m^2). Pt meets criteria for Morbid Obesity based on current BMI.  Current diet order is Full Liquid. RD following. RD contact information provided. Encouraged pt to contact RD with any additional questions or concerns.  Ian Malkineanne Barnett RD, LDN Inpatient Clinical Dietitian Pager: [phone] After Hours Pager: [phone]

## 2013-11-22 NOTE — Progress Notes (Signed)
[facility] Progress Note  Subjective: The patient is feeling better. He is eating some breakfast. His EGD was negative. He complains today that his pain is more to the right side and he is running his hand across the right flank area.  Objective: Vital signs in last 24 hours: Temp:  [98 F (36.7 C)-99.6 F (37.6 C)] 98 F (36.7 C) (02/18 0517) Pulse Rate:  [85-107] 85 (02/18 0517) Resp:  [20-36] 24 (02/18 0517) BP: (127-146)/(65-79) 127/65 mmHg (02/18 0517) SpO2:  [95 %-100 %] 100 % (02/18 0517) Weight:  [131.498 kg (289 lb 14.4 oz)] 131.498 kg (289 lb 14.4 oz) (02/18 0517) Weight change: -1.724 kg (-3 lb 12.8 oz)   PE:  He is in no distress  Abdomen is soft, no tenderness in the epigastric area  Lab Results: Results for orders placed during the hospital encounter of 11/20/13 (from the past 24 hour(s))  GLUCOSE, CAPILLARY     Status: Abnormal   Collection Time    11/21/13 12:08 PM      Result Value Ref Range   Glucose-Capillary 171 (*) 70 - 99 mg/dL  GLUCOSE, CAPILLARY     Status: Abnormal   Collection Time    11/21/13  4:40 PM      Result Value Ref Range   Glucose-Capillary 198 (*) 70 - 99 mg/dL  GLUCOSE, CAPILLARY     Status: Abnormal   Collection Time    11/21/13  8:04 PM      Result Value Ref Range   Glucose-Capillary 255 (*) 70 - 99 mg/dL   Comment 1 Documented in Chart     Comment 2 Notify RN    GLUCOSE, CAPILLARY     Status: Abnormal   Collection Time    11/22/13 12:14 AM      Result Value Ref Range   Glucose-Capillary 237 (*) 70 - 99 mg/dL   Comment 1 Documented in Chart     Comment 2 Notify RN    GLUCOSE, CAPILLARY     Status: Abnormal   Collection Time    11/22/13  3:58 AM      Result Value Ref Range   Glucose-Capillary 172 (*) 70 - 99 mg/dL   Comment 1 Documented in Chart     Comment 2 Notify RN    GLUCOSE, CAPILLARY     Status: Abnormal   Collection Time    11/22/13  8:00 AM      Result Value Ref Range   Glucose-Capillary 189 (*)  70 - 99 mg/dL    Studies/Results: @RISRSLT24 @    Assessment: He seems to be improving  Plan: Advance diet as tolerated. Home soon.    GANEM,SALEM F 11/22/2013, 11:23 AM

## 2013-11-22 NOTE — Progress Notes (Signed)
Noted pt does not have a follow-up scheduled after discharge. Noted discharge meds/insulins listed as Lantus and Novolog. Pt can get one bottle of lantus and novolog, but my concern is that no note has been made regarding how soon pt can get Medicaid. (it has expired). Ordered case management to look into and document if appt has been made with [facility] and if Medicaid will cover meds once he needs refills on his insulin after using the one-time Match program. If needs to use the traditional novolin insulin, would suggest using Novolin 70/30 (NPH/Reg pre-mix) for discharge.at 15 units in the am and 15 units ac supper. Pt trying to learn all today what he needs to learn for basic self care. Pt has been on pain meds yesterday and has only been responsive to teaching today. Concern that patient can handle and absorb all the education he will need for discharge. Thank you, Lenor CoffinAnn Germain Koopmann, RN, CNS, Diabetes Coordinator [phone])

## 2013-11-22 NOTE — Progress Notes (Addendum)
Spoke with patient today who states he is feeling a little better.  Drinking lots of water as he states he is so thirsty. His Uncle is to arrive around 2pm. In the meantime, I gave pt a list of ed'l videos to watch and the number to call. Would probably be helpful if his Uncle could be present for any/all education. Pt now watching the ed'l videos.Thank you, Lenor CoffinAnn Akisha Sturgill, RN, CNS, Diabetes Coordinator [phone]) Will revisit this afternoon.

## 2013-11-22 NOTE — Progress Notes (Signed)
About to educate about drawing insulin but diabetes coordinator in with patient teaching about insulin pens. Patient will go home with pen and patient been giving insulin to self.

## 2013-11-22 NOTE — Progress Notes (Addendum)
[facility] PROGRESS NOTE  Brent Costa [name] DOB: 09-14-1991 DOA: 11/20/2013 PCP: Brent Becker, MD  Assessment/Plan: Principal Problem:  Nausea & vomiting  Active Problems:  Hyperglycemia  23 y/o male with no significant past medical history presented to the ER because of persistent nausea vomiting abdominal pain  1. Nausea vomiting and abdominal pain of unclear etiology; CT: Circumferential wall thickening involving the descending duodenum with associated inflammatory change in the paraduodenal fat and pancreaticoduodenal groove -s/p EGD: no PUD; still c/o R sided abd pain -afebrile, tachycardia improved on empiric atx to cover GI flora; cont IVF, entiemetics; pain control; PPI, check H pylori pend  2. New Dx: DM; HA1C-10.3; started insulin regimen while in hospital; DM education;   3. Substance abuse; d/w recommended to quit using drugs   4. Mild Hyperbilirubinemia; [place] abd: No evidence of cholelithiasis or biliary obstruction -rechecked in AM; resolving    Code Status: full Family Communication: d/w patient, brother,  (indicate person spoken with, relationship, and if by phone, the number) Disposition Plan: home 24-48 hours    Consultants: GI  Procedures:  None   Antibiotics:  None  (indicate start date, and stop date if known)  HPI/Subjective: alert  Objective: Filed Vitals:   11/22/13 0517  BP: 127/65  Pulse: 85  Temp: 98 F (36.7 C)  Resp: 24    Intake/Output Summary (Last 24 hours) at 11/22/13 0944 Last data filed at 11/22/13 0600  Gross per 24 hour  Intake 2051.25 ml  Output    400 ml  Net 1651.25 ml   Filed Weights   11/20/13 0222 11/21/13 0555 11/22/13 0517  Weight: 133.358 kg (294 lb) 133.221 kg (293 lb 11.2 oz) 131.498 kg (289 lb 14.4 oz)    Exam:   General:  alert  Cardiovascular: s1,s2 rrr  Respiratory: CTA BL  Abdomen: soft, mild epigatric tender, nd   Musculoskeletal: no LE edema   Data  Reviewed: Basic Metabolic Panel:  Recent Labs Lab 11/18/13 2130 11/18/13 2331 11/20/13 0240 11/20/13 1005 11/21/13 0435  NA 127* 127* 131* 137 135*  K 7.2* 4.8 4.0 4.0 3.8  CL 90* 89* 91* 99 98  CO2 18* 22 21 23 23   GLUCOSE 268* 248* 279* 234* 203*  BUN 11 10 8 8 8   CREATININE 0.71 0.73 0.77 0.80 0.76  CALCIUM 8.7 8.8 9.5 8.9 8.8   Liver Function Tests:  Recent Labs Lab 11/18/13 2130 11/18/13 2331 11/20/13 0240 11/20/13 1005 11/21/13 0435  AST 106* 24 21 14 15   ALT 13 23 25 20 17   ALKPHOS 42 65 74 66 61  BILITOT 1.1 1.2 1.7* 1.3* 1.1  PROT 7.8 7.2 8.3 7.3 6.9  ALBUMIN 4.0 3.8 4.0 3.3* 3.0*    Recent Labs Lab 11/18/13 2130 11/20/13 0240  LIPASE 48 18   No results found for this basename: AMMONIA,  in the last 168 hours CBC:  Recent Labs Lab 11/18/13 1923 11/20/13 0240 11/20/13 1005 11/21/13 0435  WBC 7.3 11.0* 9.5 9.3  NEUTROABS 5.1 8.6* 7.3  --   HGB 16.4 15.6 13.9 12.7*  HCT 42.2 42.6 39.3 36.7*  MCV 78.1 79.9 80.5 81.7  PLT 272 222 197 179   Cardiac Enzymes: No results found for this basename: CKTOTAL, CKMB, CKMBINDEX, TROPONINI,  in the last 168 hours BNP (last 3 results) No results found for this basename: PROBNP,  in the last 8760 hours CBG:  Recent Labs Lab 11/21/13 1640 11/21/13 2004 11/22/13 0014 11/22/13 0358 11/22/13 0800  GLUCAP 198* 255* 237* 172* 189*    No results found for this or any previous visit (from the past 240 hour(s)).   Studies: No results found.  Scheduled Meds: . antiseptic oral rinse  15 mL Mouth Rinse q12n4p  . chlorhexidine  15 mL Mouth Rinse BID  . insulin aspart  0-9 Units Subcutaneous TID WC  . insulin glargine  10 Units Subcutaneous QHS  . levofloxacin (LEVAQUIN) IV  750 mg Intravenous Q24H  . metronidazole  500 mg Intravenous Q8H  . pantoprazole (PROTONIX) IV  40 mg Intravenous Q24H   Continuous Infusions: . sodium chloride 75 mL/hr at 11/22/13 0913    Principal Problem:   Nausea &  vomiting Active Problems:   Hyperglycemia    Time spent:     [name], [name]  [facility] Pager [phone]. If 7PM-7AM, please contact night-coverage at [email or web address], password [email or web address] 11/22/2013, 9:44 AM  LOS: 2 days

## 2013-11-22 NOTE — Progress Notes (Signed)
2/18 2015  Spoke with patient once again regarding his status for Medicaid and / or assist program. Pt can get a box of Lantus and Novolog pens each with 5 pens of 300 units each. [facility], gave directions and asked them to either call or go to the center to get an appt.Unsure if pt will be able to renew lantus and novolog, however MD's at follow-up can adjust/change as needed. Gave patient a handout on the [employer] cbg meter and that he would probably need to check glucose three times a day with meals and at bedtime; RN to instruct with discharge orders. Had pt observe how to use an insulin pen, reviewed sightss for injections, etc. Pt was able to re-demonstrate how to use. Pt has watched the videos and is very adept at learning quickly.  Assured pt that RN would review the discharge instructions with him regarding his insulin dosing and checking glucose. Will order exit care notes for pt as appropriate as well. Thank you, Lenor CoffinAnn Keyshia Orwick, RN, CNS, Diabetes Coordinator [phone])

## 2013-11-23 LAB — GLUCOSE, CAPILLARY
GLUCOSE-CAPILLARY: 198 mg/dL — AB (ref 70–99)
GLUCOSE-CAPILLARY: 210 mg/dL — AB (ref 70–99)
GLUCOSE-CAPILLARY: 235 mg/dL — AB (ref 70–99)
Glucose-Capillary: 198 mg/dL — ABNORMAL HIGH (ref 70–99)
Glucose-Capillary: 206 mg/dL — ABNORMAL HIGH (ref 70–99)

## 2013-11-23 MED ORDER — PANTOPRAZOLE SODIUM 40 MG PO TBEC
40.0000 mg | DELAYED_RELEASE_TABLET | Freq: Every day | ORAL | Status: DC
  Administered 2013-11-23: 40 mg via ORAL
  Filled 2013-11-23: qty 1

## 2013-11-23 MED ORDER — LEVOFLOXACIN 750 MG PO TABS
750.0000 mg | ORAL_TABLET | Freq: Every day | ORAL | Status: DC
  Administered 2013-11-24: 750 mg via ORAL
  Filled 2013-11-23 (×2): qty 1

## 2013-11-23 MED ORDER — METRONIDAZOLE 500 MG PO TABS
500.0000 mg | ORAL_TABLET | Freq: Three times a day (TID) | ORAL | Status: DC
  Administered 2013-11-23 – 2013-11-24 (×4): 500 mg via ORAL
  Filled 2013-11-23 (×7): qty 1

## 2013-11-23 NOTE — Progress Notes (Signed)
[facility] Progress Note  Subjective: He feels better today. He like to go home. Still with a little abdominal discomfort, but nothing he can't handle.  Objective: Vital signs in last 24 hours: Temp:  [97 F (36.1 C)-98.2 F (36.8 C)] 97.5 F (36.4 C) (02/19 1347) Pulse Rate:  [74-84] 84 (02/19 1347) Resp:  [17-18] 17 (02/19 1347) BP: (132-144)/(59-67) 132/62 mmHg (02/19 1347) SpO2:  [100 %] 100 % (02/19 1347) Weight change:    PE:  He is in no distress  Abdomen is soft  Lab Results: Results for orders placed during the hospital encounter of 11/20/13 (from the past 24 hour(s))  GLUCOSE, CAPILLARY     Status: Abnormal   Collection Time    11/22/13  5:12 PM      Result Value Ref Range   Glucose-Capillary 190 (*) 70 - 99 mg/dL   Comment 1 Notify RN    GLUCOSE, CAPILLARY     Status: Abnormal   Collection Time    11/22/13  7:56 PM      Result Value Ref Range   Glucose-Capillary 146 (*) 70 - 99 mg/dL  GLUCOSE, CAPILLARY     Status: Abnormal   Collection Time    11/23/13 12:21 AM      Result Value Ref Range   Glucose-Capillary 235 (*) 70 - 99 mg/dL  GLUCOSE, CAPILLARY     Status: Abnormal   Collection Time    11/23/13  7:39 AM      Result Value Ref Range   Glucose-Capillary 198 (*) 70 - 99 mg/dL  GLUCOSE, CAPILLARY     Status: Abnormal   Collection Time    11/23/13 11:50 AM      Result Value Ref Range   Glucose-Capillary 206 (*) 70 - 99 mg/dL    Studies/Results: @RISRSLT24 @    Assessment: Abdominal pain improving  Plan: Patient seems to be doing much better. I think he can probably go home.    Graylin ShiverGANEM,SALEM F 11/23/2013, 2:42 PM

## 2013-11-23 NOTE — Progress Notes (Signed)
[facility] PROGRESS NOTE  SAHITH NURSE ZOX:[id number] DOB: 1991/08/04 DOA: 11/20/2013 PCP: Forest Becker, MD  Assessment/Plan: Principal Problem:  Nausea & vomiting  Active Problems:  Hyperglycemia  23 y/o male with no significant past medical history presented to the ER because of persistent nausea vomiting abdominal pain  1. Nausea vomiting and abdominal pain of unclear etiology; CT: Circumferential wall thickening involving the descending duodenum with associated inflammatory change in the paraduodenal fat and pancreaticoduodenal groove -s/p EGD: no PUD; still c/o R sided abd pain -improved on empiric atx to cover GI flora; cont IVF, entiemetics; pain control; PPI, check H pylori pend  2. New Dx: DM; HA1C-10.3; started insulin regimen while in hospital; DM education;   3. Substance abuse; d/w recommended to quit using drugs   4. Mild Hyperbilirubinemia; [place] abd: No evidence of cholelithiasis or biliary obstruction -rechecked in AM; resolving    Dc/ plan possible today   Code Status: full Family Communication: d/w patient, brother,  (indicate person spoken with, relationship, and if by phone, the number) Disposition Plan: home 24-48 hours    Consultants: GI  Procedures:  None   Antibiotics:  None  (indicate start date, and stop date if known)  HPI/Subjective: alert  Objective: Filed Vitals:   11/23/13 0520  BP: 142/59  Pulse: 74  Temp: 98.2 F (36.8 C)  Resp: 18   No intake or output data in the 24 hours ending 11/23/13 1050 Filed Weights   11/20/13 0222 11/21/13 0555 11/22/13 0517  Weight: 133.358 kg (294 lb) 133.221 kg (293 lb 11.2 oz) 131.498 kg (289 lb 14.4 oz)    Exam:   General:  alert  Cardiovascular: s1,s2 rrr  Respiratory: CTA BL  Abdomen: soft, mild epigatric tender, nd   Musculoskeletal: no LE edema   Data Reviewed: Basic Metabolic Panel:  Recent Labs Lab 11/18/13 2130 11/18/13 2331 11/20/13 0240  11/20/13 1005 11/21/13 0435  NA 127* 127* 131* 137 135*  K 7.2* 4.8 4.0 4.0 3.8  CL 90* 89* 91* 99 98  CO2 18* 22 21 23 23   GLUCOSE 268* 248* 279* 234* 203*  BUN 11 10 8 8 8   CREATININE 0.71 0.73 0.77 0.80 0.76  CALCIUM 8.7 8.8 9.5 8.9 8.8   Liver Function Tests:  Recent Labs Lab 11/18/13 2130 11/18/13 2331 11/20/13 0240 11/20/13 1005 11/21/13 0435  AST 106* 24 21 14 15   ALT 13 23 25 20 17   ALKPHOS 42 65 74 66 61  BILITOT 1.1 1.2 1.7* 1.3* 1.1  PROT 7.8 7.2 8.3 7.3 6.9  ALBUMIN 4.0 3.8 4.0 3.3* 3.0*    Recent Labs Lab 11/18/13 2130 11/20/13 0240  LIPASE 48 18   No results found for this basename: AMMONIA,  in the last 168 hours CBC:  Recent Labs Lab 11/18/13 1923 11/20/13 0240 11/20/13 1005 11/21/13 0435  WBC 7.3 11.0* 9.5 9.3  NEUTROABS 5.1 8.6* 7.3  --   HGB 16.4 15.6 13.9 12.7*  HCT 42.2 42.6 39.3 36.7*  MCV 78.1 79.9 80.5 81.7  PLT 272 222 197 179   Cardiac Enzymes: No results found for this basename: CKTOTAL, CKMB, CKMBINDEX, TROPONINI,  in the last 168 hours BNP (last 3 results) No results found for this basename: PROBNP,  in the last 8760 hours CBG:  Recent Labs Lab 11/22/13 1213 11/22/13 1712 11/22/13 1956 11/23/13 0021 11/23/13 0739  GLUCAP 210* 190* 146* 235* 198*    No results found for this or any previous visit (from the past  240 hour(s)).   Studies: No results found.  Scheduled Meds: . insulin aspart  0-9 Units Subcutaneous TID WC  . insulin glargine  10 Units Subcutaneous QHS  . levofloxacin (LEVAQUIN) IV  750 mg Intravenous Q24H  . metronidazole  500 mg Intravenous Q8H  . pantoprazole (PROTONIX) IV  40 mg Intravenous Q24H   Continuous Infusions: . sodium chloride 75 mL/hr at 11/22/13 2250    Principal Problem:   Nausea & vomiting Active Problems:   Hyperglycemia    Time spent:     [name], Elison Worrel N  [facility] Pager [phone]. If 7PM-7AM, please contact night-coverage at [email or web address], password  [email or web address] 11/23/2013, 10:50 AM  LOS: 3 days

## 2013-11-24 LAB — GLUCOSE, CAPILLARY
GLUCOSE-CAPILLARY: 253 mg/dL — AB (ref 70–99)
Glucose-Capillary: 153 mg/dL — ABNORMAL HIGH (ref 70–99)

## 2013-11-24 MED ORDER — METRONIDAZOLE 500 MG PO TABS: 500.0000 mg | ORAL_TABLET | Freq: Three times a day (TID) | ORAL | Status: DC

## 2013-11-24 MED ORDER — INSULIN GLARGINE 100 UNIT/ML SOLOSTAR PEN: 10.0000 [IU] | PEN_INJECTOR | Freq: Every day | SUBCUTANEOUS | Status: DC

## 2013-11-24 MED ORDER — LEVOFLOXACIN 500 MG PO TABS: 500.0000 mg | ORAL_TABLET | Freq: Every day | ORAL | Status: DC

## 2013-11-24 MED ORDER — PANTOPRAZOLE SODIUM 40 MG PO TBEC: 40.0000 mg | DELAYED_RELEASE_TABLET | Freq: Every day | ORAL | Status: DC

## 2013-11-24 NOTE — Progress Notes (Signed)
Discharge home. Home discharge instruction given, no questions verbalized. 

## 2013-11-24 NOTE — Discharge Summary (Signed)
Physician Discharge Summary  Brent Costa:[id number] DOB: Apr 28, 1991 DOA: 11/20/2013  PCP: Forest Becker, MD  Admit date: 11/20/2013 Discharge date: 11/24/2013  Time spent: >35 minutes  Recommendations for Outpatient Follow-up:  F/u with PCP in 1 week  Discharge Diagnoses:  Principal Problem:   Nausea & vomiting Active Problems:   Hyperglycemia   Discharge Condition: stable   Diet recommendation: DM  Filed Weights   11/21/13 0555 11/22/13 0517 11/24/13 0538  Weight: 133.221 kg (293 lb 11.2 oz) 131.498 kg (289 lb 14.4 oz) 131.044 kg (288 lb 14.4 oz)    History of present illness:  Principal Problem:  Nausea & vomiting  Active Problems:  Hyperglycemia   23 y/o male with no significant past medical history presented to the ER because of persistent nausea vomiting abdominal pain    Hospital Course:  1. Nausea vomiting and abdominal pain of unclear etiology; CT: Circumferential wall thickening involving the descending duodenum with associated inflammatory change in the paraduodenal fat and pancreaticoduodenal groove  -s/p EGD: no PUD; his symptoms mproved on empiric atx to cover GI flora;  2. New Dx: DM; HA1C-10.3; started insulin regimen; DM education;  -f/u at wellness clinic in 1 week to titrate insulin as needed  3. Substance abuse; d/w recommended to quit using drugs  4. Mild Hyperbilirubinemia; [place] abd: No evidence of cholelithiasis or biliary obstruction; resolved    Procedures:  EGD (i.e. Studies not automatically included, echos, thoracentesis, etc; not x-rays)  Consultations:  GI  Discharge Exam: Filed Vitals:   11/24/13 0538  BP: 137/74  Pulse: 70  Temp: 97.5 F (36.4 C)  Resp: 18    General: alert Cardiovascular: s1,s2 rrr Respiratory: CTA BL  Discharge Instructions  Discharge Orders   Future Orders Complete By Expires   Diet - low sodium heart healthy  As directed    Discharge instructions  As directed    Comments:      Please follow up with primary care doctor in 1-2 weeks   Increase activity slowly  As directed        Medication List    STOP taking these medications       famotidine 20 MG tablet  Commonly known as:  PEPCID      TAKE these medications       Insulin Glargine 100 UNIT/ML Solostar Pen  Commonly known as:  LANTUS SOLOSTAR  Inject 10 Units into the skin daily at 10 pm.     levofloxacin 500 MG tablet  Commonly known as:  LEVAQUIN  Take 1 tablet (500 mg total) by mouth daily.     metroNIDAZOLE 500 MG tablet  Commonly known as:  FLAGYL  Take 1 tablet (500 mg total) by mouth 3 (three) times daily.     ondansetron 4 MG disintegrating tablet  Commonly known as:  ZOFRAN ODT  Take 1 tablet (4 mg total) by mouth every 8 (eight) hours as needed for nausea or vomiting.     pantoprazole 40 MG tablet  Commonly known as:  PROTONIX  Take 1 tablet (40 mg total) by mouth at bedtime.     sucralfate 1 GM/10ML suspension  Commonly known as:  CARAFATE  Take 10 mLs (1 g total) by mouth 4 (four) times daily -  with meals and at bedtime.       No Known Allergies     Follow-up Information   Follow up with [facility]    . Schedule an appointment as  soon as possible for a visit in 1 week.   Contact information:   [address] [place] [id number] [phone]       The results of significant diagnostics from this hospitalization (including imaging, microbiology, ancillary and laboratory) are listed below for reference.    Significant Diagnostic Studies: [place] Abdomen Complete  11/20/2013   CLINICAL DATA:  Abdominal pain and elevated bili Rubin.  EXAM: ULTRASOUND ABDOMEN COMPLETE  COMPARISON:  None.  FINDINGS: Gallbladder:  No gallstones or wall thickening visualized. No sonographic Murphy sign noted.  Common bile duct:  Diameter: 5 mm, where seen.  No evidence of filling defect.  Liver:  The liver is diffusely echogenic with poor acoustic  transmission. No focal abnormality is seen.Antegrade flow in the imaged portal venous system.  IVC:  Limited visualization.  Pancreas:  Limited visualization due to small acoustic windows.  Spleen:  Size and appearance within normal limits.  Right Kidney:  Length: 13 cm. Echogenicity within normal limits. No mass or hydronephrosis visualized.  Left Kidney:  Length: 12 cm. Echogenicity within normal limits. No mass or hydronephrosis visualized.  Abdominal aorta:  No aneurysm visualized.  Other findings:  None.  IMPRESSION: 1. No evidence of cholelithiasis or biliary obstruction. 2. Fatty liver.   Electronically Signed   By: Tiburcio PeaJonathan  Watts M.D.   On: 11/20/2013 07:13   Ct Abdomen Pelvis W Contrast  11/18/2013   CLINICAL DATA:  Generalized abdominal pain, nausea and body aches  EXAM: CT ABDOMEN AND PELVIS WITH CONTRAST  TECHNIQUE: Multidetector CT imaging of the abdomen and pelvis was performed using the standard protocol following bolus administration of intravenous contrast.  CONTRAST:  100mL OMNIPAQUE IOHEXOL 300 MG/ML  SOLN  COMPARISON:  None.  FINDINGS: Lower Chest: The lung bases are clear. Tiny 2 mm nodule in the periphery of the left lower lobe was almost certainly infectious/inflammatory/granulomatous. Visualized cardiac structures are within normal limits for size. No pericardial effusion. Unremarkable visualized distal thoracic esophagus.  Abdomen: Focal circumferential wall thickening of the descending (D2) segment of the duodenum with a blurring of the pancreaticoduodenal groove. There is a small amount of free fluid posterior to the duodenum along with thickening of the anterior para renal fascia. No extravasation of oral contrast material which opacifies the duodenum lumen. The pancreatic body and tail are unremarkable. The pancreatic head and uncinate process adjacent to the abnormal segment of the duodenum is somewhat less well defined and there may be trace peripancreatic stranding. The findings  are most notable in the pancreaticoduodenal groove. Unremarkable appearance of the stomach, spleen, and adrenal glands. Mild hypoattenuation of the hepatic parenchyma suggests fatty infiltration. No discrete hepatic lesion. Gallbladder is unremarkable. No intra or extrahepatic biliary ductal dilatation.  Unremarkable appearance of the bilateral kidneys. No focal solid lesion, hydronephrosis or nephrolithiasis.  No evidence of obstruction or focal bowel wall thickening. Normal appendix in the right lower quadrant. The terminal ileum is unremarkable.  Pelvis: Unremarkable bladder, prostate gland and seminal vesicles. No free fluid or suspicious adenopathy.  Bones/Soft Tissues: No acute fracture or aggressive appearing lytic or blastic osseous lesion.  Vascular: No significant atherosclerotic vascular disease, aneurysmal dilatation or acute abnormality.  IMPRESSION: 1. Circumferential wall thickening involving the descending duodenum with associated inflammatory change in the paraduodenal fat and pancreaticoduodenal groove. There is reactive thickening and fluid tracking along the anterior pararenal fascia. Differential considerations include infectious or inflammatory duodenitis, peptic ulcer disease, and potentially groove pancreatitis. 2. Probable hepatic steatosis.   Electronically Signed  By: Malachy Moan M.D.   On: 11/18/2013 21:49   Dg Abd Acute W/chest  11/20/2013   CLINICAL DATA:  Severe abdominal pain with vomiting  EXAM: ACUTE ABDOMEN SERIES (ABDOMEN 2 VIEW & CHEST 1 VIEW)  COMPARISON:  Prior CT from 11/18/2013  FINDINGS: Cardiac and mediastinal silhouettes are within normal limits.  Lungs are normally inflated. No airspace consolidation, pulmonary edema, pleural effusion, or pneumothorax.  Enteric contrast material seen within the ascending, transverse, and proximal descending colon, compatible with history of recent CT examination. No dilated loops of bowel to suggest obstruction or ileus. Single  air-fluid level noted within the transverse colon on upper projection. No small bowel dilatation. No soft tissue mass or abnormal calcification. No free intraperitoneal air.  Osseous structures are within normal limits.  IMPRESSION: 1. Nonobstructive bowel gas pattern. 2. No acute cardiopulmonary disease.   Electronically Signed   By: Rise Mu M.D.   On: 11/20/2013 03:45    Microbiology: No results found for this or any previous visit (from the past 240 hour(s)).   Labs: Basic Metabolic Panel:  Recent Labs Lab 11/18/13 2130 11/18/13 2331 11/20/13 0240 11/20/13 1005 11/21/13 0435  NA 127* 127* 131* 137 135*  K 7.2* 4.8 4.0 4.0 3.8  CL 90* 89* 91* 99 98  CO2 18* 22 21 23 23   GLUCOSE 268* 248* 279* 234* 203*  BUN 11 10 8 8 8   CREATININE 0.71 0.73 0.77 0.80 0.76  CALCIUM 8.7 8.8 9.5 8.9 8.8   Liver Function Tests:  Recent Labs Lab 11/18/13 2130 11/18/13 2331 11/20/13 0240 11/20/13 1005 11/21/13 0435  AST 106* 24 21 14 15   ALT 13 23 25 20 17   ALKPHOS 42 65 74 66 61  BILITOT 1.1 1.2 1.7* 1.3* 1.1  PROT 7.8 7.2 8.3 7.3 6.9  ALBUMIN 4.0 3.8 4.0 3.3* 3.0*    Recent Labs Lab 11/18/13 2130 11/20/13 0240  LIPASE 48 18   No results found for this basename: AMMONIA,  in the last 168 hours CBC:  Recent Labs Lab 11/18/13 1923 11/20/13 0240 11/20/13 1005 11/21/13 0435  WBC 7.3 11.0* 9.5 9.3  NEUTROABS 5.1 8.6* 7.3  --   HGB 16.4 15.6 13.9 12.7*  HCT 42.2 42.6 39.3 36.7*  MCV 78.1 79.9 80.5 81.7  PLT 272 222 197 179   Cardiac Enzymes: No results found for this basename: CKTOTAL, CKMB, CKMBINDEX, TROPONINI,  in the last 168 hours BNP: BNP (last 3 results) No results found for this basename: PROBNP,  in the last 8760 hours CBG:  Recent Labs Lab 11/23/13 0739 11/23/13 1150 11/23/13 1703 11/23/13 2136 11/24/13 0803  GLUCAP 198* 206* 198* 210* 153*       Signed:  Jonette Mate N  [facility] 11/24/2013, 11:33 AM

## 2013-12-01 ENCOUNTER — Ambulatory Visit: Payer: No Typology Code available for payment source | Attending: Pediatrics

## 2013-12-11 ENCOUNTER — Ambulatory Visit: Payer: Self-pay

## 2014-01-11 ENCOUNTER — Encounter: Payer: Self-pay | Admitting: Internal Medicine

## 2014-01-11 ENCOUNTER — Ambulatory Visit: Payer: No Typology Code available for payment source | Attending: Internal Medicine | Admitting: Internal Medicine

## 2014-01-11 VITALS — BP 153/100 | HR 106 | Temp 98.6°F | Resp 16 | Ht 69.0 in | Wt 280.0 lb

## 2014-01-11 DIAGNOSIS — E119 Type 2 diabetes mellitus without complications: Secondary | ICD-10-CM | POA: Insufficient documentation

## 2014-01-11 DIAGNOSIS — R739 Hyperglycemia, unspecified: Secondary | ICD-10-CM

## 2014-01-11 DIAGNOSIS — E1140 Type 2 diabetes mellitus with diabetic neuropathy, unspecified: Secondary | ICD-10-CM | POA: Insufficient documentation

## 2014-01-11 DIAGNOSIS — R7309 Other abnormal glucose: Secondary | ICD-10-CM

## 2014-01-11 LAB — GLUCOSE, POCT (MANUAL RESULT ENTRY): POC GLUCOSE: 365 mg/dL — AB (ref 70–99)

## 2014-01-11 MED ORDER — INSULIN LISPRO 100 UNIT/ML ~~LOC~~ SOLN: SUBCUTANEOUS | Status: DC

## 2014-01-11 MED ORDER — INSULIN GLARGINE 100 UNIT/ML SOLOSTAR PEN: 20.0000 [IU] | PEN_INJECTOR | Freq: Every day | SUBCUTANEOUS | Status: DC

## 2014-01-11 MED ORDER — INSULIN ASPART 100 UNIT/ML ~~LOC~~ SOLN
20.0000 [IU] | Freq: Once | SUBCUTANEOUS | Status: AC
  Administered 2014-01-11: 20 [IU] via SUBCUTANEOUS

## 2014-01-11 NOTE — Progress Notes (Signed)
Pt is here today to establish care. Pt is here to continue his treatment for HTN and diabetes.

## 2014-01-11 NOTE — Patient Instructions (Signed)
DASH Diet The DASH diet stands for "Dietary Approaches to Stop Hypertension." It is a healthy eating plan that has been shown to reduce high blood pressure (hypertension) in as little as 14 days, while also possibly providing other significant health benefits. These other health benefits include reducing the risk of breast cancer after menopause and reducing the risk of type 2 diabetes, heart disease, colon cancer, and stroke. Health benefits also include weight loss and slowing kidney failure in patients with chronic kidney disease.  DIET GUIDELINES  Limit salt (sodium). Your diet should contain less than 1500 mg of sodium daily.  Limit refined or processed carbohydrates. Your diet should include mostly whole grains. Desserts and added sugars should be used sparingly.  Include small amounts of heart-healthy fats. These types of fats include nuts, oils, and tub margarine. Limit saturated and trans fats. These fats have been shown to be harmful in the body. CHOOSING FOODS  The following food groups are based on a 2000 calorie diet. See your Registered Dietitian for individual calorie needs. Grains and Grain Products (6 to 8 servings daily)  Eat More Often: Whole-wheat bread, brown rice, whole-grain or wheat pasta, quinoa, popcorn without added fat or salt (air popped).  Eat Less Often: White bread, white pasta, white rice, cornbread. Vegetables (4 to 5 servings daily)  Eat More Often: Fresh, frozen, and canned vegetables. Vegetables may be raw, steamed, roasted, or grilled with a minimal amount of fat.  Eat Less Often/Avoid: Creamed or fried vegetables. Vegetables in a cheese sauce. Fruit (4 to 5 servings daily)  Eat More Often: All fresh, canned (in natural juice), or frozen fruits. Dried fruits without added sugar. One hundred percent fruit juice ( cup [237 mL] daily).  Eat Less Often: Dried fruits with added sugar. Canned fruit in light or heavy syrup. Lean Meats, Fish, and Poultry (2  servings or less daily. One serving is 3 to 4 oz [85-114 g]).  Eat More Often: Ninety percent or leaner ground beef, tenderloin, sirloin. Round cuts of beef, chicken breast, turkey breast. All fish. Grill, bake, or broil your meat. Nothing should be fried.  Eat Less Often/Avoid: Fatty cuts of meat, turkey, or chicken leg, thigh, or wing. Fried cuts of meat or fish. Dairy (2 to 3 servings)  Eat More Often: Low-fat or fat-free milk, low-fat plain or light yogurt, reduced-fat or part-skim cheese.  Eat Less Often/Avoid: Milk (whole, 2%).Whole milk yogurt. Full-fat cheeses. Nuts, Seeds, and Legumes (4 to 5 servings per week)  Eat More Often: All without added salt.  Eat Less Often/Avoid: Salted nuts and seeds, canned beans with added salt. Fats and Sweets (limited)  Eat More Often: Vegetable oils, tub margarines without trans fats, sugar-free gelatin. Mayonnaise and salad dressings.  Eat Less Often/Avoid: Coconut oils, palm oils, butter, stick margarine, cream, half and half, cookies, candy, pie. FOR MORE INFORMATION The Dash Diet Eating Plan: www.dashdiet.org Document Released: 09/10/2011 Document Revised: 12/14/2011 Document Reviewed: 09/10/2011 Exit[employer] Patient Information 2014 [employer]. Diabetes and Exercise Exercising regularly is important. It is not just about losing weight. It has many health benefits, such as:  Improving your overall fitness, flexibility, and endurance.  Increasing your bone density.  Helping with weight control.  Decreasing your body fat.  Increasing your muscle strength.  Reducing stress and tension.  Improving your overall health. People with diabetes who exercise gain additional benefits because exercise:  Reduces appetite.  Improves the body's use of blood sugar (glucose).  Helps lower or control blood glucose.    Decreases blood pressure.  Helps control blood lipids (such as cholesterol and triglycerides).  Improves the body's use  of the hormone insulin by:  Increasing the body's insulin sensitivity.  Reducing the body's insulin needs.  Decreases the risk for heart disease because exercising:  Lowers cholesterol and triglycerides levels.  Increases the levels of good cholesterol (such as high-density lipoproteins [HDL]) in the body.  Lowers blood glucose levels. YOUR ACTIVITY PLAN  Choose an activity that you enjoy and set realistic goals. Your health care provider or diabetes educator can help you make an activity plan that works for you. You can break activities into 2 or 3 sessions throughout the day. Doing so is as good as one long session. Exercise ideas include:  Taking the dog for a walk.  Taking the stairs instead of the elevator.  Dancing to your favorite song.  Doing your favorite exercise with a friend. RECOMMENDATIONS FOR EXERCISING WITH TYPE 1 OR TYPE 2 DIABETES   Check your blood glucose before exercising. If blood glucose levels are greater than 240 mg/dL, check for urine ketones. Do not exercise if ketones are present.  Avoid injecting insulin into areas of the body that are going to be exercised. For example, avoid injecting insulin into:  The arms when playing tennis.  The legs when jogging.  Keep a record of:  Food intake before and after you exercise.  Expected peak times of insulin action.  Blood glucose levels before and after you exercise.  The type and amount of exercise you have done.  Review your records with your health care provider. Your health care provider will help you to develop guidelines for adjusting food intake and insulin amounts before and after exercising.  If you take insulin or oral hypoglycemic agents, watch for signs and symptoms of hypoglycemia. They include:  Dizziness.  Shaking.  Sweating.  Chills.  Confusion.  Drink plenty of water while you exercise to prevent dehydration or heat stroke. Body water is lost during exercise and must be  replaced.  Talk to your health care provider before starting an exercise program to make sure it is safe for you. Remember, almost any type of activity is better than none. Document Released: 12/12/2003 Document Revised: 05/24/2013 Document Reviewed: 02/28/2013 [facility] Patient Information 2014 [place], [place]. Diabetes Meal Planning Guide The diabetes meal planning guide is a tool to help you plan your meals and snacks. It is important for people with diabetes to manage their blood glucose (sugar) levels. Choosing the right foods and the right amounts throughout your day will help control your blood glucose. Eating right can even help you improve your blood pressure and reach or maintain a healthy weight. CARBOHYDRATE COUNTING MADE EASY When you eat carbohydrates, they turn to sugar. This raises your blood glucose level. Counting carbohydrates can help you control this level so you feel better. When you plan your meals by counting carbohydrates, you can have more flexibility in what you eat and balance your medicine with your food intake. Carbohydrate counting simply means adding up the total amount of carbohydrate grams in your meals and snacks. Try to eat about the same amount at each meal. Foods with carbohydrates are listed below. Each portion below is 1 carbohydrate serving or 15 grams of carbohydrates. Ask your dietician how many grams of carbohydrates you should eat at each meal or snack. Grains and Starches  1 slice bread.   English muffin or hotdog/hamburger bun.   cup cold cereal (unsweetened).   cup  cooked pasta or rice.   cup starchy vegetables (corn, potatoes, peas, beans, winter squash).  1 tortilla (6 inches).   bagel.  1 waffle or pancake (size of a CD).   cup cooked cereal.  4 to 6 small crackers. *Whole grain is recommended. Fruit  1 cup fresh unsweetened berries, melon, papaya, pineapple.  1 small fresh fruit.   banana or mango.   cup fruit juice (4  oz unsweetened).   cup canned fruit in natural juice or water.  2 tbs dried fruit.  12 to 15 grapes or cherries. Milk and Yogurt  1 cup fat-free or 1% milk.  1 cup soy milk.  6 oz light yogurt with sugar-free sweetener.  6 oz low-fat soy yogurt.  6 oz plain yogurt. Vegetables  1 cup raw or  cup cooked is counted as 0 carbohydrates or a "free" food.  If you eat 3 or more servings at 1 meal, count them as 1 carbohydrate serving. Other Carbohydrates   oz chips or pretzels.   cup ice cream or frozen yogurt.   cup sherbet or sorbet.  2 inch square cake, no frosting.  1 tbs honey, sugar, jam, jelly, or syrup.  2 small cookies.  3 squares of graham crackers.  3 cups popcorn.  6 crackers.  1 cup broth-based soup.  Count 1 cup casserole or other mixed foods as 2 carbohydrate servings.  Foods with less than 20 calories in a serving may be counted as 0 carbohydrates or a "free" food. You may want to purchase a book or computer software that lists the carbohydrate gram counts of different foods. In addition, the nutrition facts panel on the labels of the foods you eat are a good source of this information. The label will tell you how big the serving size is and the total number of carbohydrate grams you will be eating per serving. Divide this number by 15 to obtain the number of carbohydrate servings in a portion. Remember, 1 carbohydrate serving equals 15 grams of carbohydrate. SERVING SIZES Measuring foods and serving sizes helps you make sure you are getting the right amount of food. The list below tells how big or small some common serving sizes are.  1 oz.........4 stacked dice.  3 oz........Marland KitchenDeck of cards.  1 tsp.......Marland KitchenTip of little finger.  1 tbs......Marland KitchenMarland KitchenThumb.  2 tbs.......Marland KitchenGolf ball.   cup......Marland KitchenHalf of a fist.  1 cup.......Marland KitchenA fist. SAMPLE DIABETES MEAL PLAN Below is a sample meal plan that includes foods from the grain and starches, dairy,  vegetable, fruit, and meat groups. A dietician can individualize a meal plan to fit your calorie needs and tell you the number of servings needed from each food group. However, controlling the total amount of carbohydrates in your meal or snack is more important than making sure you include all of the food groups at every meal. You may interchange carbohydrate containing foods (dairy, starches, and fruits). The meal plan below is an example of a 2000 calorie diet using carbohydrate counting. This meal plan has 17 carbohydrate servings. Breakfast  1 cup oatmeal (2 carb servings).   cup light yogurt (1 carb serving).  1 cup blueberries (1 carb serving).   cup almonds. Snack  1 large apple (2 carb servings).  1 low-fat string cheese stick. Lunch  Chicken breast salad.  1 cup spinach.   cup chopped tomatoes.  2 oz chicken breast, sliced.  2 tbs low-fat [place] dressing.  12 whole-wheat crackers (2 carb servings).  12 to 15  grapes (1 carb serving).  1 cup low-fat milk (1 carb serving). Snack  1 cup carrots.   cup hummus (1 carb serving). Dinner  3 oz broiled salmon.  1 cup brown rice (3 carb servings). Snack  1  cups steamed broccoli (1 carb serving) drizzled with 1 tsp olive oil and lemon juice.  1 cup light pudding (2 carb servings). DIABETES MEAL PLANNING WORKSHEET Your dietician can use this worksheet to help you decide how many servings of foods and what types of foods are right for you.  BREAKFAST Food Group and Servings / Carb Servings Grain/Starches __________________________________ Dairy __________________________________________ Vegetable ______________________________________ Fruit ___________________________________________ Meat __________________________________________ Fat ____________________________________________ LUNCH Food Group and Servings / Carb Servings Grain/Starches ___________________________________ Dairy  ___________________________________________ Fruit ____________________________________________ Meat ___________________________________________ Fat _____________________________________________ Laural GoldenINNER Food Group and Servings / Carb Servings Grain/Starches ___________________________________ Dairy ___________________________________________ Fruit ____________________________________________ Meat ___________________________________________ Fat _____________________________________________ SNACKS Food Group and Servings / Carb Servings Grain/Starches ___________________________________ Dairy ___________________________________________ Vegetable _______________________________________ Fruit ____________________________________________ Meat ___________________________________________ Fat _____________________________________________ DAILY TOTALS Starches _________________________ Vegetable ________________________ Fruit ____________________________ Dairy ____________________________ Meat ____________________________ Fat ______________________________ Document Released: 06/18/2005 Document Revised: 12/14/2011 Document Reviewed: 04/29/2009 [employer] Patient Information 2014 [place] [place].

## 2014-01-11 NOTE — Progress Notes (Signed)
Patient ID: Brent Costa, male   DOB: 10/11/1990, 23 y.o.   MRN: [id number]   Reznor Ferrando, is a 23 y.o. male  WGN:[id number]  HQI:[id number]  DOB - March 11, 1991  CC:  Chief Complaint  Patient presents with  . Establish Care       HPI: Brent Costa is a 23 y.o. male here today to establish medical care with newly diagnosed diabetes. He was diagnosed with diabetes at [facility] in February. He was discharged on Lantus and given a booklet on diabetes management and diet.Today he states he has not eaten, and has only been eating one meal daily in the evening. He is also experiencing numbness of the the fifth digit in his right foot. His past medical history is negative for any other conditions. He quit smoking 2 weeks ago and started an exercise program one month ago and has lost 8 pounds. His review of systems is otherwise unremarkable. Patient has No headache, No chest pain, No abdominal pain - No Nausea, No new weakness tingling or numbness, No Cough - SOB.  No Known Allergies Past Medical History  Diagnosis Date  . Hypertension   . Acid reflux   . Chronic bronchitis     "get it about q year" (11/20/2013)   Current Outpatient Prescriptions on File Prior to Visit  Medication Sig Dispense Refill  . levofloxacin (LEVAQUIN) 500 MG tablet Take 1 tablet (500 mg total) by mouth daily.  5 tablet  0  . pantoprazole (PROTONIX) 40 MG tablet Take 1 tablet (40 mg total) by mouth at bedtime.  30 tablet  0  . metroNIDAZOLE (FLAGYL) 500 MG tablet Take 1 tablet (500 mg total) by mouth 3 (three) times daily.  21 tablet  0  . ondansetron (ZOFRAN ODT) 4 MG disintegrating tablet Take 1 tablet (4 mg total) by mouth every 8 (eight) hours as needed for nausea or vomiting.  20 tablet  0  . sucralfate (CARAFATE) 1 GM/10ML suspension Take 10 mLs (1 g total) by mouth 4 (four) times daily -  with meals and at bedtime.  420 mL  0   No current facility-administered medications on file prior to visit.    Family History  Problem Relation Age of Onset  . Diabetes Mellitus II Father   . Heart failure Father    History   Social History  . Marital Status: Single    Spouse Name: N/A    Number of Children: N/A  . Years of Education: N/A   Occupational History  . Not on file.   Social History Main Topics  . Smoking status: Former Smoker -- 0.50 packs/day for 3 years    Types: Cigarettes    Quit date: 12/11/2013  . Smokeless tobacco: Not on file  . Alcohol Use: Yes     Comment: 11/20/2013 "only drink at birthdays, parties, holidays"  . Drug Use: 2.00 per week    Special: Marijuana     Comment: 11/20/2013 "used to use marijuana; used it last < 1 month ago"  . Sexual Activity: Yes   Other Topics Concern  . Not on file   Social History Narrative  . No narrative on file    Review of Systems: Constitutional: Negative for fever, chills, diaphoresis, activity change, appetite change and fatigue. HENT: Negative for ear pain, nosebleeds, congestion, facial swelling, rhinorrhea, neck pain, neck stiffness and ear discharge.  Eyes: Negative for pain, discharge, redness, itching and visual disturbance. Respiratory: Negative for cough, choking, chest tightness, shortness of  breath, wheezing and stridor.  Cardiovascular: Negative for chest pain, palpitations and leg swelling. Gastrointestinal: Negative for abdominal distention. Genitourinary: Negative for dysuria, urgency, frequency, hematuria, flank pain, decreased urine volume, difficulty urinating and dyspareunia.  Musculoskeletal: Negative for back pain, joint swelling, arthralgia and gait problem. Neurological: Negative for dizziness, tremors, seizures, syncope, facial asymmetry, speech difficulty, weakness, light-headedness, and headaches. Positive for numbness in the fifth digit of his right foot.   Hematological: Negative for adenopathy. Does not bruise/bleed easily. Psychiatric/Behavioral: Negative for hallucinations, behavioral  problems, confusion, dysphoric mood, decreased concentration and agitation.    Objective:   Filed Vitals:   01/11/14 1535  BP: 153/100  Pulse: 106  Temp: 98.6 F (37 C)  Resp: 16    Physical Exam: Constitutional: Patient appears well-developed and well-nourished. No distress. Obese  HENT: Normocephalic, atraumatic, External right and left ear normal. Oropharynx is clear and moist.  Eyes: Conjunctivae and EOM are normal. PERRLA, no scleral icterus. Neck: Normal ROM. Neck supple. No JVD. No tracheal deviation. No thyromegaly. CVS: RRR, S1/S2 +, no murmurs, no gallops, no carotid bruit.  Pulmonary: Effort and breath sounds normal, no stridor, rhonchi, wheezes, rales.  Abdominal: Soft. BS +, no distension, tenderness, rebound or guarding.  Musculoskeletal: Normal range of motion. No edema and no tenderness.  Lymphadenopathy: No lymphadenopathy noted, cervical, inguinal or axillary Neuro: Alert. Normal reflexes, muscle tone coordination. No cranial nerve deficit. Unable to distinguish sharp and soft sensation in fifth digit of right foot.    Skin: Skin is warm and dry. No rash noted. Not diaphoretic. No erythema. No pallor. Psychiatric: Normal mood and affect. Behavior, judgment, thought content normal.  Lab Results  Component Value Date   WBC 9.3 11/21/2013   HGB 12.7* 11/21/2013   HCT 36.7* 11/21/2013   MCV 81.7 11/21/2013   PLT 179 11/21/2013   Lab Results  Component Value Date   CREATININE 0.76 11/21/2013   BUN 8 11/21/2013   NA 135* 11/21/2013   K 3.8 11/21/2013   CL 98 11/21/2013   CO2 23 11/21/2013    Lab Results  Component Value Date   HGBA1C 10.3* 11/20/2013   Lipid Panel  No results found for this basename: chol, trig, hdl, cholhdl, vldl, ldlcalc       Assessment and plan:   1. Diabetes  - Glucose (CBG) - insulin aspart (novoLOG) injection 20 Units; Inject 0.2 mLs (20 Units total) into the skin once. Increase Lantus to 20 units each bedtime  - Insulin Glargine  (LANTUS) 100 UNIT/ML Solostar Pen; Inject 20 Units into the skin daily at 10 pm.  Dispense: 3 pen; Refill: 3 Also add insulin to scale: - insulin lispro (HUMALOG) 100 UNIT/ML injection;  Blood Glucose 150 - 200, give 3 units Blood Glucose 201 - 250, give 5 units Blood Glucose 251 - 300, give 7 units Blood Glucose 301 - 350, give 10 units  Dispense: 3 vial; Refill: 3  She was educated extensively on signs and symptoms of hypoglycemia and what to do immediately when these symptoms occur  - Ambulatory referral to diabetic education - Ambulatory referral to Ophthalmology - Ambulatory referral to Podiatry  Lab tests: - C-peptide - Insulin, random - Insulin antibody - Insulin-free, total and bound), blood  2. Hyperglycemia  - insulin aspart (novoLOG) injection 20 Units; Inject 0.2 mLs (20 Units total) into the skin once.  3. High blood pressure Patient was given clear instruction to record ambulatory blood pressure and bring all the log to the clinic next  week  Return in about 1 week (around 01/18/2014), or if symptoms worsen or fail to improve, for CBG, Lab/Nurse Visit, BP Check, Nurse Visit.  The patient was given clear instructions to go to ER or return to medical center if symptoms don't improve, worsen or new problems develop. The patient verbalized understanding. The patient was told to call to get lab results if they haven't heard anything in the next week.     This note has been created with Education officer, environmental. Any transcriptional errors are unintentional.    Jeanann Lewandowsky, MD, MHA, [facility] [place], [place] [phone]   01/11/2014, 4:52 PM

## 2014-01-12 LAB — MICROALBUMIN / CREATININE URINE RATIO
Creatinine, Urine: 67.5 mg/dL
MICROALB/CREAT RATIO: 177.6 mg/g — AB (ref 0.0–30.0)
Microalb, Ur: 11.99 mg/dL — ABNORMAL HIGH (ref 0.00–1.89)

## 2014-01-12 LAB — INSULIN, RANDOM: Insulin: 45 u[IU]/mL — ABNORMAL HIGH (ref 3–28)

## 2014-01-12 LAB — C-PEPTIDE: C-Peptide: 3.68 ng/mL (ref 0.80–3.90)

## 2014-01-14 ENCOUNTER — Encounter: Payer: Self-pay | Admitting: Internal Medicine

## 2014-01-16 ENCOUNTER — Ambulatory Visit: Payer: Self-pay | Admitting: Internal Medicine

## 2014-01-17 LAB — INSULIN ANTIBODIES, BLOOD: Insulin Antibodies, Human: <0.4 U/mL (ref <0.4)

## 2014-01-18 ENCOUNTER — Encounter: Payer: Self-pay | Admitting: Internal Medicine

## 2014-01-18 ENCOUNTER — Ambulatory Visit: Payer: No Typology Code available for payment source

## 2014-01-18 ENCOUNTER — Ambulatory Visit: Payer: No Typology Code available for payment source | Attending: Internal Medicine | Admitting: Internal Medicine

## 2014-01-18 ENCOUNTER — Ambulatory Visit: Payer: Self-pay | Admitting: Home Health Services

## 2014-01-18 VITALS — BP 111/75 | HR 96 | Temp 98.6°F | Resp 16 | Ht 69.0 in | Wt 289.0 lb

## 2014-01-18 DIAGNOSIS — E119 Type 2 diabetes mellitus without complications: Secondary | ICD-10-CM

## 2014-01-18 LAB — GLUCOSE, POCT (MANUAL RESULT ENTRY): POC GLUCOSE: 211 mg/dL — AB (ref 70–99)

## 2014-01-18 MED ORDER — INSULIN GLARGINE 100 UNIT/ML SOLOSTAR PEN: 35.0000 [IU] | PEN_INJECTOR | Freq: Every day | SUBCUTANEOUS | Status: DC

## 2014-01-18 NOTE — Progress Notes (Unsigned)
Patient ID: Brent Costa, male   DOB: 05/14/1991, 23 y.o.   MRN: [id number]   CC:  HPI: Patient here for a CBG check. The patient CBG has ranged from 230 to 365. The patient endorses compliance with medication. Currently on 20 units of Lantus each bedtime. Also taking lispro sliding scale. Denies any blurry vision, chest pain, shortness of breath   No Known Allergies Past Medical History  Diagnosis Date  . Hypertension   . Acid reflux   . Chronic bronchitis     "get it about q year" (11/20/2013)   Current Outpatient Prescriptions on File Prior to Visit  Medication Sig Dispense Refill  . insulin lispro (HUMALOG) 100 UNIT/ML injection Blood Glucose 150 - 200, give 3 units Blood Glucose 201 - 250, give 5 units Blood Glucose 251 - 300, give 7 units Blood Glucose 301 - 350, give 10 units  3 vial  3  . ondansetron (ZOFRAN ODT) 4 MG disintegrating tablet Take 1 tablet (4 mg total) by mouth every 8 (eight) hours as needed for nausea or vomiting.  20 tablet  0  . pantoprazole (PROTONIX) 40 MG tablet Take 1 tablet (40 mg total) by mouth at bedtime.  30 tablet  0  . levofloxacin (LEVAQUIN) 500 MG tablet Take 1 tablet (500 mg total) by mouth daily.  5 tablet  0  . metroNIDAZOLE (FLAGYL) 500 MG tablet Take 1 tablet (500 mg total) by mouth 3 (three) times daily.  21 tablet  0  . sucralfate (CARAFATE) 1 GM/10ML suspension Take 10 mLs (1 g total) by mouth 4 (four) times daily -  with meals and at bedtime.  420 mL  0   No current facility-administered medications on file prior to visit.   Family History  Problem Relation Age of Onset  . Diabetes Mellitus II Father   . Heart failure Father    History   Social History  . Marital Status: Single    Spouse Name: N/A    Number of Children: N/A  . Years of Education: N/A   Occupational History  . Not on file.   Social History Main Topics  . Smoking status: Former Smoker -- 0.50 packs/day for 3 years    Types: Cigarettes    Quit date:  12/11/2013  . Smokeless tobacco: Not on file  . Alcohol Use: Yes     Comment: 11/20/2013 "only drink at birthdays, parties, holidays"  . Drug Use: 2.00 per week    Special: Marijuana     Comment: 11/20/2013 "used to use marijuana; used it last < 1 month ago"  . Sexual Activity: Yes   Other Topics Concern  . Not on file   Social History Narrative  . No narrative on file    Review of Systems  Constitutional: Negative for fever, chills, diaphoresis, activity change, appetite change and fatigue.  HENT: Negative for ear pain, nosebleeds, congestion, facial swelling, rhinorrhea, neck pain, neck stiffness and ear discharge.   Eyes: Negative for pain, discharge, redness, itching and visual disturbance.  Respiratory: Negative for cough, choking, chest tightness, shortness of breath, wheezing and stridor.   Cardiovascular: Negative for chest pain, palpitations and leg swelling.  Gastrointestinal: Negative for abdominal distention.  Genitourinary: Negative for dysuria, urgency, frequency, hematuria, flank pain, decreased urine volume, difficulty urinating and dyspareunia.  Musculoskeletal: Negative for back pain, joint swelling, arthralgias and gait problem.  Neurological: Negative for dizziness, tremors, seizures, syncope, facial asymmetry, speech difficulty, weakness, light-headedness, numbness and headaches.  Hematological: Negative  for adenopathy. Does not bruise/bleed easily.  Psychiatric/Behavioral: Negative for hallucinations, behavioral problems, confusion, dysphoric mood, decreased concentration and agitation.    Objective:   Filed Vitals:   01/18/14 1555  BP: 111/75  Pulse: 96  Temp: 98.6 F (37 C)  Resp: 16    Physical Exam  Constitutional: Appears well-developed and well-nourished. No distress.  HENT: Normocephalic. External right and left ear normal. Oropharynx is clear and moist.  Eyes: Conjunctivae and EOM are normal. PERRLA, no scleral icterus.  Neck: Normal ROM. Neck  supple. No JVD. No tracheal deviation. No thyromegaly.  CVS: RRR, S1/S2 +, no murmurs, no gallops, no carotid bruit.  Pulmonary: Effort and breath sounds normal, no stridor, rhonchi, wheezes, rales.  Abdominal: Soft. BS +,  no distension, tenderness, rebound or guarding.  Musculoskeletal: Normal range of motion. No edema and no tenderness.  Lymphadenopathy: No lymphadenopathy noted, cervical, inguinal. Neuro: Alert. Normal reflexes, muscle tone coordination. No cranial nerve deficit. Skin: Skin is warm and dry. No rash noted. Not diaphoretic. No erythema. No pallor.  Psychiatric: Normal mood and affect. Behavior, judgment, thought content normal.   Lab Results  Component Value Date   WBC 9.3 11/21/2013   HGB 12.7* 11/21/2013   HCT 36.7* 11/21/2013   MCV 81.7 11/21/2013   PLT 179 11/21/2013   Lab Results  Component Value Date   CREATININE 0.76 11/21/2013   BUN 8 11/21/2013   NA 135* 11/21/2013   K 3.8 11/21/2013   CL 98 11/21/2013   CO2 23 11/21/2013    Lab Results  Component Value Date   HGBA1C 10.3* 11/20/2013   Lipid Panel  No results found for this basename: chol, trig, hdl, cholhdl, vldl, ldlcalc       Assessment and plan:   Patient Active Problem List   Diagnosis Date Noted  . Diabetes 01/11/2014  . Hyperglycemia 11/20/2013  . Nausea & vomiting 11/20/2013   Uncontrolled diabetes insulin-dependent, last A1c greater than 10 Increase Lantus to 35 units at bedtime Patient to continue Accu-Cheks Followup with clinical pharmacy in for weeks Follow up with M.D. in 2 months      The patient was given clear instructions to go to ER or return to medical center if symptoms don't improve, worsen or new problems develop. The patient verbalized understanding. The patient was told to call to get any lab results if not heard anything in the next week.

## 2014-01-18 NOTE — Progress Notes (Unsigned)
Subjective:     Patient ID: Brent Costa, male   DOB: Nov 15, 1990, 23 y.o.   MRN: [id number]  HPI   Review of Systems    assessment complete Objective:   Physical Exam     Assessment:    assessment complete ***    Plan:    assessment complete ***  assessment complete   Pt assessment done on previous documentation.

## 2014-01-18 NOTE — Progress Notes (Unsigned)
Pt returns today for repeat blood pressure and CBG s/p newly diagnosis Diabetes Type 2 and elevated bp Pt is taking Lantus 20 units at night and sliding scale with meals. States CBG have been running high CBG at home today 230 am, 295 afternoon

## 2014-01-19 ENCOUNTER — Other Ambulatory Visit: Payer: Self-pay | Admitting: Internal Medicine

## 2014-01-19 DIAGNOSIS — E119 Type 2 diabetes mellitus without complications: Secondary | ICD-10-CM

## 2014-01-19 MED ORDER — INSULIN ASPART 100 UNIT/ML ~~LOC~~ SOLN: SUBCUTANEOUS | Status: DC

## 2014-01-19 MED ORDER — INSULIN GLARGINE 100 UNIT/ML SOLOSTAR PEN: 35.0000 [IU] | PEN_INJECTOR | Freq: Every day | SUBCUTANEOUS | Status: DC

## 2014-01-22 ENCOUNTER — Telehealth: Payer: Self-pay | Admitting: Emergency Medicine

## 2014-01-22 NOTE — Telephone Encounter (Signed)
Left message for pt to call for lab results 

## 2014-01-22 NOTE — Telephone Encounter (Signed)
Message copied by Darlis LoanSMITH, Laityn Bensen D on Mon Jan 22, 2014  2:58 PM ------      Message from: [name], [name] E      Created: Mon Jan 22, 2014  1:44 PM       Please inform patient that his laboratory tests results show a slight kidney damage from high blood sugar, we need to continue blood sugar control to prevent further damage. Encourage patient to adhere strictly with nutritional control with low carbohydrate diet, as well as regular physical exercise ------

## 2014-02-02 ENCOUNTER — Ambulatory Visit: Payer: Self-pay | Admitting: Dietician

## 2014-02-06 ENCOUNTER — Ambulatory Visit: Payer: Self-pay | Admitting: Home Health Services

## 2014-02-20 ENCOUNTER — Ambulatory Visit: Payer: Self-pay | Admitting: Pharmacist

## 2014-04-09 ENCOUNTER — Ambulatory Visit: Payer: No Typology Code available for payment source | Attending: Internal Medicine | Admitting: Internal Medicine

## 2014-04-09 ENCOUNTER — Encounter: Payer: Self-pay | Admitting: Internal Medicine

## 2014-04-09 VITALS — BP 139/84 | HR 100 | Temp 98.5°F | Resp 16 | Ht 69.0 in | Wt 291.0 lb

## 2014-04-09 DIAGNOSIS — Z794 Long term (current) use of insulin: Secondary | ICD-10-CM | POA: Insufficient documentation

## 2014-04-09 DIAGNOSIS — R7309 Other abnormal glucose: Secondary | ICD-10-CM

## 2014-04-09 DIAGNOSIS — I10 Essential (primary) hypertension: Secondary | ICD-10-CM | POA: Insufficient documentation

## 2014-04-09 DIAGNOSIS — K219 Gastro-esophageal reflux disease without esophagitis: Secondary | ICD-10-CM | POA: Insufficient documentation

## 2014-04-09 DIAGNOSIS — R739 Hyperglycemia, unspecified: Secondary | ICD-10-CM

## 2014-04-09 DIAGNOSIS — L84 Corns and callosities: Secondary | ICD-10-CM | POA: Insufficient documentation

## 2014-04-09 DIAGNOSIS — E119 Type 2 diabetes mellitus without complications: Secondary | ICD-10-CM | POA: Insufficient documentation

## 2014-04-09 LAB — POCT GLYCOSYLATED HEMOGLOBIN (HGB A1C): Hemoglobin A1C: 7.8

## 2014-04-09 LAB — GLUCOSE, POCT (MANUAL RESULT ENTRY): POC GLUCOSE: 284 mg/dL — AB (ref 70–99)

## 2014-04-09 MED ORDER — INSULIN GLARGINE 100 UNIT/ML SOLOSTAR PEN: 35.0000 [IU] | PEN_INJECTOR | Freq: Every day | SUBCUTANEOUS | Status: DC

## 2014-04-09 MED ORDER — PANTOPRAZOLE SODIUM 40 MG PO TBEC: 40.0000 mg | DELAYED_RELEASE_TABLET | Freq: Every day | ORAL | Status: DC

## 2014-04-09 MED ORDER — INSULIN ASPART 100 UNIT/ML ~~LOC~~ SOLN
10.0000 [IU] | Freq: Once | SUBCUTANEOUS | Status: AC
  Administered 2014-04-09: 10 [IU] via SUBCUTANEOUS

## 2014-04-09 MED ORDER — "INSULIN SYRINGE-NEEDLE U-100 26G X 1/2"" 1 ML MISC": Status: DC

## 2014-04-09 NOTE — Progress Notes (Signed)
Pt is here following up on his diabetes. Pt has a swollen spot on his lower left foot. Pt states that he needs a refill for his medications.

## 2014-04-09 NOTE — Patient Instructions (Signed)
Diabetes Mellitus and Food It is important for you to manage your blood sugar (glucose) level. Your blood glucose level can be greatly affected by what you eat. Eating healthier foods in the appropriate amounts throughout the day at about the same time each day will help you control your blood glucose level. It can also help slow or prevent worsening of your diabetes mellitus. Healthy eating may even help you improve the level of your blood pressure and reach or maintain a healthy weight.  HOW CAN FOOD AFFECT ME? Carbohydrates Carbohydrates affect your blood glucose level more than any other type of food. Your dietitian will help you determine how many carbohydrates to eat at each meal and teach you how to count carbohydrates. Counting carbohydrates is important to keep your blood glucose at a healthy level, especially if you are using insulin or taking certain medicines for diabetes mellitus. Alcohol Alcohol can cause sudden decreases in blood glucose (hypoglycemia), especially if you use insulin or take certain medicines for diabetes mellitus. Hypoglycemia can be a life-threatening condition. Symptoms of hypoglycemia (sleepiness, dizziness, and disorientation) are similar to symptoms of having too much alcohol.  If your health care provider has given you approval to drink alcohol, do so in moderation and use the following guidelines:  Women should not have more than one drink per day, and men should not have more than two drinks per day. One drink is equal to:  12 oz of beer.  5 oz of wine.  1 oz of hard liquor.  Do not drink on an empty stomach.  Keep yourself hydrated. Have water, diet soda, or unsweetened iced tea.  Regular soda, juice, and other mixers might contain a lot of carbohydrates and should be counted. WHAT FOODS ARE NOT RECOMMENDED? As you make food choices, it is important to remember that all foods are not the same. Some foods have fewer nutrients per serving than other  foods, even though they might have the same number of calories or carbohydrates. It is difficult to get your body what it needs when you eat foods with fewer nutrients. Examples of foods that you should avoid that are high in calories and carbohydrates but low in nutrients include:  Trans fats (most processed foods list trans fats on the Nutrition Facts label).  Regular soda.  Juice.  Candy.  Sweets, such as cake, pie, doughnuts, and cookies.  Fried foods. WHAT FOODS CAN I EAT? Have nutrient-rich foods, which will nourish your body and keep you healthy. The food you should eat also will depend on several factors, including:  The calories you need.  The medicines you take.  Your weight.  Your blood glucose level.  Your blood pressure level.  Your cholesterol level. You also should eat a variety of foods, including:  Protein, such as meat, poultry, fish, tofu, nuts, and seeds (lean animal proteins are best).  Fruits.  Vegetables.  Dairy products, such as milk, cheese, and yogurt (low fat is best).  Breads, grains, pasta, cereal, rice, and beans.  Fats such as olive oil, trans fat-free margarine, canola oil, avocado, and olives. DOES EVERYONE WITH DIABETES MELLITUS HAVE THE SAME MEAL PLAN? Because every person with diabetes mellitus is different, there is not one meal plan that works for everyone. It is very important that you meet with a dietitian who will help you create a meal plan that is just right for you. Document Released: 06/18/2005 Document Revised: 09/26/2013 Document Reviewed: 08/18/2013 ExitCare Patient Information 2015 [employer]. This   information is not intended to replace advice given to you by your health care provider. Make sure you discuss any questions you have with your health care provider. DASH Eating Plan DASH stands for "Dietary Approaches to Stop Hypertension." The DASH eating plan is a healthy eating plan that has been shown to reduce high  blood pressure (hypertension). Additional health benefits may include reducing the risk of type 2 diabetes mellitus, heart disease, and stroke. The DASH eating plan may also help with weight loss. WHAT DO I NEED TO KNOW ABOUT THE DASH EATING PLAN? For the DASH eating plan, you will follow these general guidelines:  Choose foods with a percent daily value for sodium of less than 5% (as listed on the food label).  Use salt-free seasonings or herbs instead of table salt or sea salt.  Check with your health care provider or pharmacist before using salt substitutes.  Eat lower-sodium products, often labeled as "lower sodium" or "no salt added."  Eat fresh foods.  Eat more vegetables, fruits, and low-fat dairy products.  Choose whole grains. Look for the word "whole" as the first word in the ingredient list.  Choose fish and skinless chicken or turkey more often than red meat. Limit fish, poultry, and meat to 6 oz (170 g) each day.  Limit sweets, desserts, sugars, and sugary drinks.  Choose heart-healthy fats.  Limit cheese to 1 oz (28 g) per day.  Eat more home-cooked food and less restaurant, buffet, and fast food.  Limit fried foods.  Cook foods using methods other than frying.  Limit canned vegetables. If you do use them, rinse them well to decrease the sodium.  When eating at a restaurant, ask that your food be prepared with less salt, or no salt if possible. WHAT FOODS CAN I EAT? Seek help from a dietitian for individual calorie needs. Grains Whole grain or whole wheat bread. Brown rice. Whole grain or whole wheat pasta. Quinoa, bulgur, and whole grain cereals. Low-sodium cereals. Corn or whole wheat flour tortillas. Whole grain cornbread. Whole grain crackers. Low-sodium crackers. Vegetables Fresh or frozen vegetables (raw, steamed, roasted, or grilled). Low-sodium or reduced-sodium tomato and vegetable juices. Low-sodium or reduced-sodium tomato sauce and paste. Low-sodium  or reduced-sodium canned vegetables.  Fruits All fresh, canned (in natural juice), or frozen fruits. Meat and Other Protein Products Ground beef (85% or leaner), grass-fed beef, or beef trimmed of fat. Skinless chicken or turkey. Ground chicken or turkey. Pork trimmed of fat. All fish and seafood. Eggs. Dried beans, peas, or lentils. Unsalted nuts and seeds. Unsalted canned beans. Dairy Low-fat dairy products, such as skim or 1% milk, 2% or reduced-fat cheeses, low-fat ricotta or cottage cheese, or plain low-fat yogurt. Low-sodium or reduced-sodium cheeses. Fats and Oils Tub margarines without trans fats. Light or reduced-fat mayonnaise and salad dressings (reduced sodium). Avocado. Safflower, olive, or canola oils. Natural peanut or almond butter. Other Unsalted popcorn and pretzels. The items listed above may not be a complete list of recommended foods or beverages. Contact your dietitian for more options. WHAT FOODS ARE NOT RECOMMENDED? Grains White bread. White pasta. White rice. Refined cornbread. Bagels and croissants. Crackers that contain trans fat. Vegetables Creamed or fried vegetables. Vegetables in a cheese sauce. Regular canned vegetables. Regular canned tomato sauce and paste. Regular tomato and vegetable juices. Fruits Dried fruits. Canned fruit in light or heavy syrup. Fruit juice. Meat and Other Protein Products Fatty cuts of meat. Ribs, chicken wings, bacon, sausage, bologna, salami, chitterlings, fatback, hot   dogs, bratwurst, and packaged luncheon meats. Salted nuts and seeds. Canned beans with salt. Dairy Whole or 2% milk, cream, half-and-half, and cream cheese. Whole-fat or sweetened yogurt. Full-fat cheeses or blue cheese. Nondairy creamers and whipped toppings. Processed cheese, cheese spreads, or cheese curds. Condiments Onion and garlic salt, seasoned salt, table salt, and sea salt. Canned and packaged gravies. Worcestershire sauce. Tartar sauce. Barbecue sauce.  Teriyaki sauce. Soy sauce, including reduced sodium. Steak sauce. Fish sauce. Oyster sauce. Cocktail sauce. Horseradish. Ketchup and mustard. Meat flavorings and tenderizers. Bouillon cubes. Hot sauce. Tabasco sauce. Marinades. Taco seasonings. Relishes. Fats and Oils Butter, stick margarine, lard, shortening, ghee, and bacon fat. Coconut, palm kernel, or palm oils. Regular salad dressings. Other Pickles and olives. Salted popcorn and pretzels. The items listed above may not be a complete list of foods and beverages to avoid. Contact your dietitian for more information. WHERE CAN I FIND MORE INFORMATION? National Heart, Lung, and Blood Institute: www.nhlbi.nih.gov/health/health-topics/topics/dash/ Document Released: 09/10/2011 Document Revised: 09/26/2013 Document Reviewed: 07/26/2013 ExitCare Patient Information 2015 [employer]. This information is not intended to replace advice given to you by your health care provider. Make sure you discuss any questions you have with your health care provider.  

## 2014-04-09 NOTE — Progress Notes (Signed)
Patient ID: Gustavo LahShrone A Warmuth, male   DOB: 02/17/1991, 23 y.o.   MRN: [id number]   Florina OuShrone Monrreal, is a 23 y.o. male  [id number]  [id number]  DOB - 02/17/1991  Chief Complaint  Patient presents with  . Follow-up        Subjective:   Florina OuShrone Cowin is a 23 y.o. male here today for a follow up visit. Patient was recently diagnosed with diabetes mellitus and hypertension, he is on insulin Lantus 35 units subcutaneous each bedtime and NovoLog per scale. He is here today for regular followup for a check on his hemoglobin A1c. He noticed a swelling on that his foot on the right after playing basketball and he wants it to be checked out, this is hard and occasionally painful. He initially thought it was because of his shoe, then he changed shoes but he continue to hurt. He has no ulceration or redness. No fever. He claims his blood sugar has been relatively controlled at home on the current regimen, maximum blood sugar was 240, minimal was 110 and then it ranges between these throughout the day. He quit smoking in March at the diagnosis of diabetes, he drinks alcohol occasionally. Patient has No headache, No chest pain, No abdominal pain - No Nausea, No new weakness tingling or numbness, No Cough - SOB.  No problems updated.  ALLERGIES: No Known Allergies  PAST MEDICAL HISTORY: Past Medical History  Diagnosis Date  . Hypertension   . Acid reflux   . Chronic bronchitis     "get it about q year" (11/20/2013)    MEDICATIONS AT HOME: Prior to Admission medications   Medication Sig Start Date End Date Taking? Authorizing Provider  insulin aspart (NOVOLOG) 100 UNIT/ML injection Blood Glucose 150 - 200, give 3 units Blood Glucose 201 - 250, give 5 units Blood Glucose 251 - 300, give 7 units Blood Glucose 301 - 350, give 10 units 01/19/14  Yes Jeanann Lewandowskylugbemiga Keisuke Hollabaugh, MD  Insulin Glargine (LANTUS) 100 UNIT/ML Solostar Pen Inject 35 Units into the skin daily at 10 pm. 04/09/14  Yes Jeanann Lewandowskylugbemiga  Jamiesha Victoria, MD  insulin lispro (HUMALOG) 100 UNIT/ML injection Blood Glucose 150 - 200, give 3 units Blood Glucose 201 - 250, give 5 units Blood Glucose 251 - 300, give 7 units Blood Glucose 301 - 350, give 10 units 01/11/14   Jeanann Lewandowskylugbemiga Ceniyah Thorp, MD  Insulin Syringe-Needle U-100 26G X 1/2" 1 ML MISC Dispense as prescribed please 04/09/14   Jeanann Lewandowskylugbemiga Etoy Mcdonnell, MD  levofloxacin (LEVAQUIN) 500 MG tablet Take 1 tablet (500 mg total) by mouth daily. 11/24/13   Esperanza SheetsUlugbek N Buriev, MD  metroNIDAZOLE (FLAGYL) 500 MG tablet Take 1 tablet (500 mg total) by mouth 3 (three) times daily. 11/24/13   Esperanza SheetsUlugbek N Buriev, MD  ondansetron (ZOFRAN ODT) 4 MG disintegrating tablet Take 1 tablet (4 mg total) by mouth every 8 (eight) hours as needed for nausea or vomiting. 11/19/13   Gerhard Munchobert Lockwood, MD  pantoprazole (PROTONIX) 40 MG tablet Take 1 tablet (40 mg total) by mouth at bedtime. 04/09/14   Jeanann Lewandowskylugbemiga Thelma Viana, MD  sucralfate (CARAFATE) 1 GM/10ML suspension Take 10 mLs (1 g total) by mouth 4 (four) times daily -  with meals and at bedtime. 11/19/13   Gerhard Munchobert Lockwood, MD     Objective:   Filed Vitals:   04/09/14 1442  BP: 139/84  Pulse: 100  Temp: 98.5 F (36.9 C)  TempSrc: Oral  Resp: 16  Height: 5\' 9"  (1.753 m)  Weight: 291 lb (131.997 kg)  SpO2: 95%    Exam General appearance : Awake, alert, not in any distress. Speech Clear. Not toxic looking HEENT: Atraumatic and Normocephalic, pupils equally reactive to light and accomodation Neck: supple, no JVD. No cervical lymphadenopathy.  Chest:Good air entry bilaterally, no added sounds  CVS: S1 S2 regular, no murmurs.  Abdomen: Bowel sounds present, Non tender and not distended with no gaurding, rigidity or rebound. Extremities: B/L Lower Ext shows no edema, both legs are warm to touch Neurology: Awake alert, and oriented X 3, CN II-XII intact, Non focal Skin:No Rash Wounds:N/A  Data Review Lab Results  Component Value Date   HGBA1C 7.8 04/09/2014   HGBA1C 10.3*  11/20/2013     Assessment & Plan   1. Type 2 diabetes mellitus without complication  - Glucose (CBG) - HgB A1c is 7.8% today down from 10.3% 3 months ago  - insulin aspart (novoLOG) injection 10 Units; Inject 0.1 mLs (10 Units total) into the skin once. - Insulin Glargine (LANTUS) 100 UNIT/ML Solostar Pen; Inject 35 Units into the skin daily at 10 pm.  Dispense: 15 pen; Refill: 3 - Insulin Syringe-Needle U-100 26G X 1/2" 1 ML MISC; Dispense as prescribed please  Dispense: 100 each; Refill: 12  2. Gastroesophageal reflux disease without esophagitis  - pantoprazole (PROTONIX) 40 MG tablet; Take 1 tablet (40 mg total) by mouth at bedtime.  Dispense: 30 tablet; Refill: 3  3. Callus of foot  - Ambulatory referral to Podiatry  Patient was counseled extensively about nutrition and exercise Patient encouraged to continue smoking  Return in about 3 months (around 07/10/2014), or if symptoms worsen or fail to improve, for Hemoglobin A1C and Follow up, DM, Follow up HTN.  The patient was given clear instructions to go to ER or return to medical center if symptoms don't improve, worsen or new problems develop. The patient verbalized understanding. The patient was told to call to get lab results if they haven't heard anything in the next week.   This note has been created with Education officer, environmentalDragon speech recognition software and smart phrase technology. Any transcriptional errors are unintentional.    [name], Dylin Ihnen, MD, MHA, FACP, FAAP [facility] [place], [place] [phone]   04/09/2014, 5:41 PM

## 2014-05-11 ENCOUNTER — Telehealth: Payer: Self-pay | Admitting: Internal Medicine

## 2014-05-11 NOTE — Telephone Encounter (Signed)
Pt. Called back regarding lab results. Please f/u with pt.

## 2014-05-26 ENCOUNTER — Encounter (HOSPITAL_COMMUNITY): Payer: Self-pay | Admitting: Emergency Medicine

## 2014-05-26 DIAGNOSIS — R112 Nausea with vomiting, unspecified: Secondary | ICD-10-CM | POA: Insufficient documentation

## 2014-05-26 DIAGNOSIS — Z87891 Personal history of nicotine dependence: Secondary | ICD-10-CM | POA: Insufficient documentation

## 2014-05-26 DIAGNOSIS — R1013 Epigastric pain: Secondary | ICD-10-CM | POA: Insufficient documentation

## 2014-05-26 DIAGNOSIS — I10 Essential (primary) hypertension: Secondary | ICD-10-CM | POA: Insufficient documentation

## 2014-05-26 DIAGNOSIS — E119 Type 2 diabetes mellitus without complications: Secondary | ICD-10-CM | POA: Insufficient documentation

## 2014-05-26 DIAGNOSIS — Z79899 Other long term (current) drug therapy: Secondary | ICD-10-CM | POA: Insufficient documentation

## 2014-05-26 DIAGNOSIS — Z794 Long term (current) use of insulin: Secondary | ICD-10-CM | POA: Insufficient documentation

## 2014-05-26 DIAGNOSIS — K219 Gastro-esophageal reflux disease without esophagitis: Secondary | ICD-10-CM | POA: Insufficient documentation

## 2014-05-26 LAB — CBG MONITORING, ED: GLUCOSE-CAPILLARY: 200 mg/dL — AB (ref 70–99)

## 2014-05-26 LAB — COMPREHENSIVE METABOLIC PANEL
ALT: 63 U/L — AB (ref 0–53)
AST: 50 U/L — ABNORMAL HIGH (ref 0–37)
Albumin: 4.7 g/dL (ref 3.5–5.2)
Alkaline Phosphatase: 62 U/L (ref 39–117)
Anion gap: 20 — ABNORMAL HIGH (ref 5–15)
BUN: 8 mg/dL (ref 6–23)
CALCIUM: 10 mg/dL (ref 8.4–10.5)
CO2: 20 mEq/L (ref 19–32)
CREATININE: 0.7 mg/dL (ref 0.50–1.35)
Chloride: 97 mEq/L (ref 96–112)
GLUCOSE: 191 mg/dL — AB (ref 70–99)
Potassium: 4.4 mEq/L (ref 3.7–5.3)
SODIUM: 137 meq/L (ref 137–147)
TOTAL PROTEIN: 7.8 g/dL (ref 6.0–8.3)
Total Bilirubin: 1 mg/dL (ref 0.3–1.2)

## 2014-05-26 LAB — CBC
HCT: 41.8 % (ref 39.0–52.0)
Hemoglobin: 14.6 g/dL (ref 13.0–17.0)
MCH: 27.8 pg (ref 26.0–34.0)
MCHC: 34.9 g/dL (ref 30.0–36.0)
MCV: 79.6 fL (ref 78.0–100.0)
Platelets: 214 10*3/uL (ref 150–400)
RBC: 5.25 MIL/uL (ref 4.22–5.81)
RDW: 12.9 % (ref 11.5–15.5)
WBC: 4.6 10*3/uL (ref 4.0–10.5)

## 2014-05-26 NOTE — ED Notes (Signed)
Patient with usually high blood sugars, states that he has been having stomach pains, nausea, vomiting x2 today.  Patient states he has been having hot flashes and dizziness.

## 2014-05-27 ENCOUNTER — Emergency Department (HOSPITAL_COMMUNITY)
Admission: EM | Admit: 2014-05-27 | Discharge: 2014-05-27 | Disposition: A | Discharge status: Home / Self Care | Payer: No Typology Code available for payment source | Attending: Emergency Medicine | Admitting: Emergency Medicine

## 2014-05-27 ENCOUNTER — Emergency Department (HOSPITAL_COMMUNITY): Payer: No Typology Code available for payment source

## 2014-05-27 DIAGNOSIS — E1165 Type 2 diabetes mellitus with hyperglycemia: Secondary | ICD-10-CM

## 2014-05-27 DIAGNOSIS — R05 Cough: Secondary | ICD-10-CM

## 2014-05-27 DIAGNOSIS — R059 Cough, unspecified: Secondary | ICD-10-CM

## 2014-05-27 LAB — URINE MICROSCOPIC-ADD ON

## 2014-05-27 LAB — URINALYSIS, ROUTINE W REFLEX MICROSCOPIC
Bilirubin Urine: NEGATIVE
Glucose, UA: NEGATIVE mg/dL
Ketones, ur: NEGATIVE mg/dL
LEUKOCYTES UA: NEGATIVE
Nitrite: NEGATIVE
PROTEIN: 100 mg/dL — AB
SPECIFIC GRAVITY, URINE: 1.034 — AB (ref 1.005–1.030)
UROBILINOGEN UA: 1 mg/dL (ref 0.0–1.0)
pH: 5.5 (ref 5.0–8.0)

## 2014-05-27 LAB — CBG MONITORING, ED
GLUCOSE-CAPILLARY: 167 mg/dL — AB (ref 70–99)
Glucose-Capillary: 159 mg/dL — ABNORMAL HIGH (ref 70–99)

## 2014-05-27 MED ORDER — ACETAMINOPHEN 325 MG PO TABS
650.0000 mg | ORAL_TABLET | Freq: Once | ORAL | Status: AC
  Administered 2014-05-27: 650 mg via ORAL
  Filled 2014-05-27: qty 2

## 2014-05-27 MED ORDER — INSULIN ASPART 100 UNIT/ML IV SOLN: 5.0000 [IU] | Freq: Once | INTRAVENOUS | Status: DC

## 2014-05-27 MED ORDER — INSULIN ASPART 100 UNIT/ML ~~LOC~~ SOLN
5.0000 [IU] | Freq: Once | SUBCUTANEOUS | Status: DC
  Filled 2014-05-27: qty 1

## 2014-05-27 MED ORDER — SODIUM CHLORIDE 0.9 % IV BOLUS (SEPSIS)
1000.0000 mL | Freq: Once | INTRAVENOUS | Status: AC
  Administered 2014-05-27: 1000 mL via INTRAVENOUS

## 2014-05-27 NOTE — Discharge Instructions (Signed)
Please start taking your diabetes medicines as directed and follow closely primary doctor. Discuss financial assistance with her primary doctor in social work to help with glucose monitor.  If you were given medicines take as directed.  If you are on coumadin or contraceptives realize their levels and effectiveness is altered by many different medicines.  If you have any reaction (rash, tongues swelling, other) to the medicines stop taking and see a physician.   Please follow up as directed and return to the ER or see a physician for new or worsening symptoms.  Thank you. Filed Vitals:   05/27/14 0030 05/27/14 0045 05/27/14 0100 05/27/14 0115  BP: 137/62 127/70 146/64 126/72  Pulse: 93 89 89 92  Temp:      TempSrc:      Resp: Height:      Weight:      SpO2: 100% 98% 97% 96%    Blood Glucose Monitoring Monitoring your blood glucose (also know as blood sugar) helps you to manage your diabetes. It also helps you and your health care provider monitor your diabetes and determine how well your treatment plan is working. WHY SHOULD YOU MONITOR YOUR BLOOD GLUCOSE?  It can help you understand how food, exercise, and medicine affect your blood glucose.  It allows you to know what your blood glucose is at any given moment. You can quickly tell if you are having low blood glucose (hypoglycemia) or high blood glucose (hyperglycemia).  It can help you and your health care provider know how to adjust your medicines.  It can help you understand how to manage an illness or adjust medicine for exercise. WHEN SHOULD YOU TEST? Your health care provider will help you decide how often you should check your blood glucose. This may depend on the type of diabetes you have, your diabetes control, or the types of medicines you are taking. Be sure to write down all of your blood glucose readings so that this information can be reviewed with your health care provider. See below for examples of testing  times that your health care provider may suggest. Type 1 Diabetes  Test 4 times a day if you are in good control, using an insulin pump, or perform multiple daily injections.  If your diabetes is not well controlled or if you are sick, you may need to monitor more often.  It is a good idea to also monitor:  Before and after exercise.  Between meals and 2 hours after a meal.  Occasionally between 2:00 a.m. and 3:00 a.m. Type 2 Diabetes  It can vary with each person, but generally, if you are on insulin, test 4 times a day.  If you take medicines by mouth (orally), test 2 times a day.  If you are on a controlled diet, test once a day.  If your diabetes is not well controlled or if you are sick, you may need to monitor more often. HOW TO MONITOR YOUR BLOOD GLUCOSE Supplies Needed  Blood glucose meter.  Test strips for your meter. Each meter has its own strips. You must use the strips that go with your own meter.  A pricking needle (lancet).  A device that holds the lancet (lancing device).  A journal or log book to write down your results. Procedure  Wash your hands with soap and water. Alcohol is not preferred.  Prick the side of your finger (not the tip) with the lancet.  Gently milk the finger until  a small drop of blood appears.  Follow the instructions that come with your meter for inserting the test strip, applying blood to the strip, and using your blood glucose meter. Other Areas to Get Blood for Testing Some meters allow you to use other areas of your body (other than your finger) to test your blood. These areas are called alternative sites. The most common alternative sites are:  The forearm.  The thigh.  The back area of the lower leg.  The palm of the hand. The blood flow in these areas is slower. Therefore, the blood glucose values you get may be delayed, and the numbers are different from what you would get from your fingers. Do not use alternative  sites if you think you are having hypoglycemia. Your reading will not be accurate. Always use a finger if you are having hypoglycemia. Also, if you cannot feel your lows (hypoglycemia unawareness), always use your fingers for your blood glucose checks. ADDITIONAL TIPS FOR GLUCOSE MONITORING  Do not reuse lancets.  Always carry your supplies with you.  All blood glucose meters have a 24-hour "hotline" number to call if you have questions or need help.  Adjust (calibrate) your blood glucose meter with a control solution after finishing a few boxes of strips. BLOOD GLUCOSE RECORD KEEPING It is a good idea to keep a daily record or log of your blood glucose readings. Most glucose meters, if not all, keep your glucose records stored in the meter. Some meters come with the ability to download your records to your home computer. Keeping a record of your blood glucose readings is especially helpful if you are wanting to look for patterns. Make notes to go along with the blood glucose readings because you might forget what happened at that exact time. Keeping good records helps you and your health care provider to work together to achieve good diabetes management.  Document Released: 09/24/2003 Document Revised: 02/05/2014 Document Reviewed: 02/13/2013 [facility] Patient Information 2015 [place], [place]. This information is not intended to replace advice given to you by your health care provider. Make sure you discuss any questions you have with your health care provider.

## 2014-05-31 ENCOUNTER — Ambulatory Visit: Payer: No Typology Code available for payment source

## 2014-06-02 NOTE — ED Provider Notes (Signed)
CSN: [id number]     Arrival date & time 05/26/14  2148 History   First MD Initiated Contact with Patient 05/27/14 0030     Chief Complaint  Patient presents with  . Hyperglycemia     (Consider location/radiation/quality/duration/timing/severity/associated sxs/prior Treatment) HPI Comments: Patient with diabetes on aspart slide and Lantus 35 units nightly, past smoker blood pressure, reflux presents with nonspecific stomach ache intermittent with mild nausea, cough and vomiting for 2 days. Patient denies fevers or chills. Mild worsening of nonproductive cough, no sick contacts. Patient says she's been taking his medicines as directed. No focal abdominal pain, mild cramping. No abdominal surgery history. Symptoms intermittent. Nonradiating. Sugars have been running high recently 200s. No history of DKA. No significant alcohol use.  The history is provided by the patient.    Past Medical History  Diagnosis Date  . Hypertension   . Acid reflux   . Chronic bronchitis     "get it about q year" (11/20/2013)   Past Surgical History  Procedure Laterality Date  . Tonsillectomy and adenoidectomy  ~ 1999  . Esophagogastroduodenoscopy N/A 11/21/2013    Procedure: ESOPHAGOGASTRODUODENOSCOPY (EGD);  Surgeon: Graylin Shiver, MD;  Location: [facility] ENDOSCOPY;  Service: Endoscopy;  Laterality: N/A;   Family History  Problem Relation Age of Onset  . Diabetes Mellitus II Father   . Heart failure Father    History  Substance Use Topics  . Smoking status: Former Smoker -- 0.50 packs/day for 3 years    Types: Cigarettes    Quit date: 12/11/2013  . Smokeless tobacco: Not on file  . Alcohol Use: No     Comment: 11/20/2013 "only drink at birthdays, parties, holidays"    Review of Systems  Constitutional: Positive for appetite change. Negative for fever and chills.  HENT: Negative for congestion.   Eyes: Negative for visual disturbance.  Respiratory: Positive for cough. Negative for shortness of breath.    Cardiovascular: Negative for chest pain.  Gastrointestinal: Positive for nausea, vomiting and abdominal pain.  Endocrine: Positive for polydipsia and polyuria.  Genitourinary: Negative for dysuria and flank pain.  Musculoskeletal: Negative for back pain, neck pain and neck stiffness.  Skin: Negative for rash.  Neurological: Negative for light-headedness and headaches.      Allergies  Review of patient's allergies indicates no known allergies.  Home Medications   Prior to Admission medications   Medication Sig Start Date End Date Taking? Authorizing Provider  insulin aspart (NOVOLOG) 100 UNIT/ML injection Blood Glucose 150 - 200, give 3 units Blood Glucose 201 - 250, give 5 units Blood Glucose 251 - 300, give 7 units Blood Glucose 301 - 350, give 10 units 01/19/14  Yes Quentin Angst, MD  Insulin Glargine (LANTUS) 100 UNIT/ML Solostar Pen Inject 35 Units into the skin daily at 10 pm. 04/09/14  Yes Quentin Angst, MD  Insulin Syringe-Needle U-100 26G X 1/2" 1 ML MISC Dispense as prescribed please 04/09/14  Yes Quentin Angst, MD  Multiple Vitamins-Minerals (MULTIVITAMIN WITH MINERALS) tablet Take 1 tablet by mouth daily.   Yes Historical Provider, MD  naproxen sodium (ANAPROX) 220 MG tablet Take 440 mg by mouth as needed (headache as needed).   Yes Historical Provider, MD  pantoprazole (PROTONIX) 40 MG tablet Take 1 tablet (40 mg total) by mouth at bedtime. 04/09/14   Quentin Angst, MD   BP 136/46  Pulse 67  Temp(Src) 98.3 F (36.8 C) (Oral)  Resp 18  Ht  (1.753 m)  Wt  291 lb (131.997 kg)  BMI 42.95 kg/m2  SpO2 100% Physical Exam  Nursing note and vitals reviewed. Constitutional: He is oriented to person, place, and time. He appears well-developed and well-nourished.  HENT:  Head: Normocephalic and atraumatic.  Dry mucous membranes  Eyes: Conjunctivae are normal. Right eye exhibits no discharge. Left eye exhibits no discharge.  Neck: Normal range of  motion. Neck supple. No tracheal deviation present.  Cardiovascular: Normal rate and regular rhythm.   Pulmonary/Chest: Effort normal and breath sounds normal.  Abdominal: Soft. He exhibits no distension. There is tenderness (mild epigastric). There is no guarding.  Musculoskeletal: He exhibits no edema.  Neurological: He is alert and oriented to person, place, and time.  Skin: Skin is warm. No rash noted.  Psychiatric: He has a normal mood and affect.    ED Course  Procedures (including critical care time) Labs Review Labs Reviewed  COMPREHENSIVE METABOLIC PANEL - Abnormal; Notable for the following:    Glucose, Bld 191 (*)    AST 50 (*)    ALT 63 (*)    Anion gap 20 (*)    All other components within normal limits  URINALYSIS, ROUTINE W REFLEX MICROSCOPIC - Abnormal; Notable for the following:    Color, Urine AMBER (*)    APPearance TURBID (*)    Specific Gravity, Urine 1.034 (*)    Hgb urine dipstick SMALL (*)    Protein, ur 100 (*)    All other components within normal limits  URINE MICROSCOPIC-ADD ON - Abnormal; Notable for the following:    Bacteria, UA MANY (*)    All other components within normal limits  CBG MONITORING, ED - Abnormal; Notable for the following:    Glucose-Capillary 200 (*)    All other components within normal limits  CBG MONITORING, ED - Abnormal; Notable for the following:    Glucose-Capillary 167 (*)    All other components within normal limits  CBG MONITORING, ED - Abnormal; Notable for the following:    Glucose-Capillary 159 (*)    All other components within normal limits  CBC    Imaging Review No results found. Dg Chest 2 View  05/27/2014   CLINICAL DATA:  Hyperglycemia and cough  EXAM: CHEST  2 VIEW  COMPARISON:  11/20/2013  FINDINGS: Normal heart size and mediastinal contours. No acute infiltrate or edema. No effusion or pneumothorax. No acute osseous findings.  IMPRESSION: No active cardiopulmonary disease.   Electronically Signed   By:  Tiburcio Pea M.D.   On: 05/27/2014 01:57    EKG Interpretation None      MDM   Final diagnoses:  Hyperglycemia due to type 2 diabetes mellitus  Cough   Well-appearing male with recent diabetic diagnosis and currently on insulin as directed presents with clinical dehydration and elevated glucose. No ketones in the urine, patient proves significantly with IV fluids and I discussed close followup outpatient and reasons to return. No obvious source of infection on exam. Vitals unremarkable in ER. Chest x-ray reviewed no acute infiltrate. Results and differential diagnosis were discussed with the patient/parent/guardian. Close follow up outpatient was discussed, comfortable with the plan.   Medications  sodium chloride 0.9 % bolus 1,000 mL (0 mLs Intravenous Stopped 05/27/14 0336)  acetaminophen (TYLENOL) tablet 650 mg (650 mg Oral Given 05/27/14 0206)    Filed Vitals:   05/27/14 0045 05/27/14 0100 05/27/14 0115 05/27/14 0331  BP: 127/70 146/64 126/72 136/46  Pulse: 89 89 92 67  Temp:  98.3 F (36.8 C)  TempSrc:    Oral  Resp: Height:      Weight:      SpO2: 98% 97% 96% 100%        Enid Skeens, MD 06/02/14 [phone]

## 2014-07-30 ENCOUNTER — Ambulatory Visit: Payer: Self-pay

## 2014-08-30 ENCOUNTER — Emergency Department (HOSPITAL_COMMUNITY)
Admission: EM | Admit: 2014-08-30 | Discharge: 2014-08-31 | Disposition: A | Discharge status: Home / Self Care | Payer: No Typology Code available for payment source | Attending: Emergency Medicine | Admitting: Emergency Medicine

## 2014-08-30 ENCOUNTER — Encounter (HOSPITAL_COMMUNITY): Payer: Self-pay | Admitting: Emergency Medicine

## 2014-08-30 DIAGNOSIS — E1165 Type 2 diabetes mellitus with hyperglycemia: Secondary | ICD-10-CM | POA: Insufficient documentation

## 2014-08-30 DIAGNOSIS — R739 Hyperglycemia, unspecified: Secondary | ICD-10-CM

## 2014-08-30 DIAGNOSIS — K219 Gastro-esophageal reflux disease without esophagitis: Secondary | ICD-10-CM | POA: Insufficient documentation

## 2014-08-30 DIAGNOSIS — I10 Essential (primary) hypertension: Secondary | ICD-10-CM | POA: Insufficient documentation

## 2014-08-30 DIAGNOSIS — Z8709 Personal history of other diseases of the respiratory system: Secondary | ICD-10-CM | POA: Insufficient documentation

## 2014-08-30 DIAGNOSIS — Z794 Long term (current) use of insulin: Secondary | ICD-10-CM | POA: Insufficient documentation

## 2014-08-30 DIAGNOSIS — Z79899 Other long term (current) drug therapy: Secondary | ICD-10-CM | POA: Insufficient documentation

## 2014-08-30 DIAGNOSIS — Z87891 Personal history of nicotine dependence: Secondary | ICD-10-CM | POA: Insufficient documentation

## 2014-08-30 LAB — URINALYSIS, ROUTINE W REFLEX MICROSCOPIC
Bilirubin Urine: NEGATIVE
Ketones, ur: 15 mg/dL — AB
Leukocytes, UA: NEGATIVE
Nitrite: NEGATIVE
PH: 5.5 (ref 5.0–8.0)
Protein, ur: NEGATIVE mg/dL
SPECIFIC GRAVITY, URINE: 1.039 — AB (ref 1.005–1.030)
Urobilinogen, UA: 1 mg/dL (ref 0.0–1.0)

## 2014-08-30 LAB — COMPREHENSIVE METABOLIC PANEL
ALBUMIN: 4.1 g/dL (ref 3.5–5.2)
ALK PHOS: 60 U/L (ref 39–117)
ALT: 32 U/L (ref 0–53)
AST: 31 U/L (ref 0–37)
Anion gap: 16 — ABNORMAL HIGH (ref 5–15)
BILIRUBIN TOTAL: 1 mg/dL (ref 0.3–1.2)
BUN: 11 mg/dL (ref 6–23)
CHLORIDE: 99 meq/L (ref 96–112)
CO2: 23 mEq/L (ref 19–32)
Calcium: 9.2 mg/dL (ref 8.4–10.5)
Creatinine, Ser: 0.83 mg/dL (ref 0.50–1.35)
GFR calc Af Amer: >90 mL/min (ref >90)
GFR calc non Af Amer: >90 mL/min (ref >90)
GLUCOSE: 228 mg/dL — AB (ref 70–99)
POTASSIUM: 4 meq/L (ref 3.7–5.3)
SODIUM: 138 meq/L (ref 137–147)
Total Protein: 7 g/dL (ref 6.0–8.3)

## 2014-08-30 LAB — URINE MICROSCOPIC-ADD ON

## 2014-08-30 LAB — CBC WITH DIFFERENTIAL/PLATELET
Basophils Absolute: 0 10*3/uL (ref 0.0–0.1)
Basophils Relative: 0 % (ref 0–1)
EOS ABS: 0.1 10*3/uL (ref 0.0–0.7)
Eosinophils Relative: 1 % (ref 0–5)
HCT: 42.1 % (ref 39.0–52.0)
HEMOGLOBIN: 14.6 g/dL (ref 13.0–17.0)
LYMPHS ABS: 2.4 10*3/uL (ref 0.7–4.0)
Lymphocytes Relative: 45 % (ref 12–46)
MCH: 28.1 pg (ref 26.0–34.0)
MCHC: 34.7 g/dL (ref 30.0–36.0)
MCV: 81 fL (ref 78.0–100.0)
Monocytes Absolute: 0.3 10*3/uL (ref 0.1–1.0)
Monocytes Relative: 6 % (ref 3–12)
NEUTROS ABS: 2.5 10*3/uL (ref 1.7–7.7)
NEUTROS PCT: 48 % (ref 43–77)
PLATELETS: 248 10*3/uL (ref 150–400)
RBC: 5.2 MIL/uL (ref 4.22–5.81)
RDW: 12.8 % (ref 11.5–15.5)
WBC: 5.2 10*3/uL (ref 4.0–10.5)

## 2014-08-30 LAB — CBG MONITORING, ED
GLUCOSE-CAPILLARY: 196 mg/dL — AB (ref 70–99)
Glucose-Capillary: 249 mg/dL — ABNORMAL HIGH (ref 70–99)

## 2014-08-30 MED ORDER — HYDROMORPHONE HCL 1 MG/ML IJ SOLN: 1.0000 mg | Freq: Once | INTRAMUSCULAR | Status: DC

## 2014-08-30 MED ORDER — SODIUM CHLORIDE 0.9 % IV BOLUS (SEPSIS)
2000.0000 mL | Freq: Once | INTRAVENOUS | Status: AC
  Administered 2014-08-30: 2000 mL via INTRAVENOUS

## 2014-08-30 MED ORDER — ONDANSETRON HCL 4 MG/2ML IJ SOLN: 4.0000 mg | Freq: Once | INTRAMUSCULAR | Status: DC

## 2014-08-30 NOTE — ED Provider Notes (Signed)
CSN: [id number]     Arrival date & time 08/30/14  2144 History   First MD Initiated Contact with Patient 08/30/14 2228     Chief Complaint  Patient presents with  . Emesis  . Blurred Vision     (Consider location/radiation/quality/duration/timing/severity/associated sxs/prior Treatment) HPI Comments: Patients with history of insulin-dependent diabetes on Lantus at bedtime and sliding scale NovoLog during the day presents with a complaint of "feeling like my blood sugar is high". Patient states that he has been working a lot and has been unable to eat and drink normally. Patient states that he has also been taking his NovoLog approximately once a day. He works second shift and takes his insulin after returning home from work. He admits to polydipsia and polyuria. Patient states that driving home tonight his vision was blurry. Patient had 2 episodes of vomiting at work [employer]. He denies fevers, URI symptoms, chest pain, cough, shortness of breath, abdominal pain, urinary symptoms. No skin rashes. He states that he does not have a history of DKA. He was diagnosed with diabetes earlier this year.  Patient is a 23 y.o. male presenting with vomiting. The history is provided by the patient and medical records.  Emesis Associated symptoms: no abdominal pain, no diarrhea, no headaches, no myalgias and no sore throat     Past Medical History  Diagnosis Date  . Hypertension   . Acid reflux   . Chronic bronchitis     "get it about q year" (11/20/2013)  . Diabetes mellitus without complication    Past Surgical History  Procedure Laterality Date  . Tonsillectomy and adenoidectomy  ~ 1999  . Esophagogastroduodenoscopy N/A 11/21/2013    Procedure: ESOPHAGOGASTRODUODENOSCOPY (EGD);  Surgeon: Graylin ShiverSalem F Ganem, MD;  [facility];  Service: Endoscopy;  Laterality: N/A;   Family History  Problem Relation Age of Onset  . Diabetes Mellitus II Father   . Heart failure Father    History   Substance Use Topics  . Smoking status: Former Smoker -- 0.50 packs/day for 3 years    Types: Cigarettes    Quit date: 12/11/2013  . Smokeless tobacco: Not on file  . Alcohol Use: No     Comment: 11/20/2013 "only drink at birthdays, parties, holidays"    Review of Systems  Constitutional: Negative for fever.  HENT: Negative for rhinorrhea and sore throat.   Eyes: Positive for visual disturbance. Negative for redness.  Respiratory: Negative for cough.   Cardiovascular: Negative for chest pain.  Gastrointestinal: Positive for nausea and vomiting. Negative for abdominal pain and diarrhea.  Genitourinary: Negative for dysuria.  Musculoskeletal: Negative for myalgias.  Skin: Negative for rash.  Neurological: Negative for headaches.   Allergies  Review of patient's allergies indicates no known allergies.  Home Medications   Prior to Admission medications   Medication Sig Start Date End Date Taking? Authorizing Provider  insulin aspart (NOVOLOG) 100 UNIT/ML injection Blood Glucose 150 - 200, give 3 units Blood Glucose 201 - 250, give 5 units Blood Glucose 251 - 300, give 7 units Blood Glucose 301 - 350, give 10 units 01/19/14   Quentin Angstlugbemiga E Jegede, MD  Insulin Glargine (LANTUS) 100 UNIT/ML Solostar Pen Inject 35 Units into the skin daily at 10 pm. 04/09/14   Quentin Angstlugbemiga E Jegede, MD  Insulin Syringe-Needle U-100 26G X 1/2" 1 ML MISC Dispense as prescribed please 04/09/14   Quentin Angstlugbemiga E Jegede, MD  Multiple Vitamins-Minerals (MULTIVITAMIN WITH MINERALS) tablet Take 1 tablet by mouth daily.  Historical Provider, MD  naproxen sodium (ANAPROX) 220 MG tablet Take 440 mg by mouth as needed (headache as needed).    Historical Provider, MD  pantoprazole (PROTONIX) 40 MG tablet Take 1 tablet (40 mg total) by mouth at bedtime. 04/09/14   Quentin Angst, MD   BP 149/80 mmHg  Pulse 104  Temp(Src) 98.7 F (37.1 C) (Oral)  Resp 14  Ht 5\' 9"  (1.753 m)  Wt 280 lb (127.007 kg)  BMI 41.33 kg/m2   SpO2 98%   Physical Exam  Constitutional: He is oriented to person, place, and time. He appears well-developed and well-nourished.  HENT:  Head: Normocephalic and atraumatic.  Right Ear: Tympanic membrane, external ear and ear canal normal.  Left Ear: Tympanic membrane, external ear and ear canal normal.  Nose: Nose normal.  Mouth/Throat: Uvula is midline, oropharynx is clear and moist and mucous membranes are normal.  Eyes: Conjunctivae, EOM and lids are normal. Pupils are equal, round, and reactive to light. Right eye exhibits no discharge. Left eye exhibits no discharge.  Neck: Normal range of motion. Neck supple.  Cardiovascular: Normal rate, regular rhythm and normal heart sounds.   Pulmonary/Chest: Effort normal and breath sounds normal.  Abdominal: Soft. There is no tenderness.  Musculoskeletal: Normal range of motion.       Cervical back: He exhibits normal range of motion, no tenderness and no bony tenderness.  Neurological: He is alert and oriented to person, place, and time. He has normal strength and normal reflexes. No cranial nerve deficit or sensory deficit. He exhibits normal muscle tone. He displays a negative Romberg sign. Coordination and gait normal. GCS eye subscore is 4. GCS verbal subscore is 5. GCS motor subscore is 6.  Skin: Skin is warm and dry.  Psychiatric: He has a normal mood and affect.  Nursing note and vitals reviewed.   ED Course  Procedures (including critical care time) Labs Review Labs Reviewed  COMPREHENSIVE METABOLIC PANEL - Abnormal; Notable for the following:    Glucose, Bld 228 (*)    Anion gap 16 (*)    All other components within normal limits  URINALYSIS, ROUTINE W REFLEX MICROSCOPIC - Abnormal; Notable for the following:    Specific Gravity, Urine 1.039 (*)    Glucose, UA >1000 (*)    Hgb urine dipstick TRACE (*)    Ketones, ur 15 (*)    All other components within normal limits  URINE MICROSCOPIC-ADD ON - Abnormal; Notable for the  following:    Squamous Epithelial / LPF FEW (*)    All other components within normal limits  CBG MONITORING, ED - Abnormal; Notable for the following:    Glucose-Capillary 249 (*)    All other components within normal limits  CBG MONITORING, ED - Abnormal; Notable for the following:    Glucose-Capillary 196 (*)    All other components within normal limits  CBC WITH DIFFERENTIAL    Imaging Review No results found.   EKG Interpretation None       Patient seen and examined. Work-up initiated. Fluids ordered.   Vital signs reviewed and are as follows: BP 111/42 mmHg  Pulse 94  Temp(Src) 98.7 F (37.1 C) (Oral)  Resp 31  Ht 5\' 9"  (1.753 m)  Wt 280 lb (127.007 kg)  BMI 41.33 kg/m2  SpO2 98%  11:33 PM Doubt DKA based on labs and clinical exam. AG=16. Bicarb normal.   12:48 AM 1st liter of fluid complete. Will give 2nd liter and reassess. Handoff  to Dr. Gwendolyn GrantWalden at shift change.    MDM   Final diagnoses:  Hyperglycemia   Hyperglycemia, no DKA, well-appearing. Do not suspect underlying infection. Suspect non-compliance and decreased oral fluids/insulin as above. Pending completion of fluids.    Renne CriglerJoshua Uilani Sanville, PA-C 08/31/14 0052  Layla MawKristen N Ward, DO 08/31/14 1537

## 2014-08-30 NOTE — ED Notes (Addendum)
Pt states that he has not had his insulin today. Pt states that he normally takes his insulin in the evening. Pt came to the ER straight from the hospital and has not had his insulin yet today.   When asked what brought him here, the pt stated "I think my sugar got a little high".

## 2014-08-30 NOTE — ED Notes (Signed)
Pt. reports nausea , vomitting , blurred vision /diaphoresis onset this evening . Denies fever or chills. Respirations unlabored .

## 2014-08-31 LAB — CBG MONITORING, ED
Glucose-Capillary: 169 mg/dL — ABNORMAL HIGH (ref 70–99)
Glucose-Capillary: 152 mg/dL — ABNORMAL HIGH (ref 70–99)

## 2014-08-31 MED ORDER — GI COCKTAIL ~~LOC~~
30.0000 mL | Freq: Once | ORAL | Status: AC
  Administered 2014-08-31: 30 mL via ORAL
  Filled 2014-08-31: qty 30

## 2014-08-31 NOTE — ED Provider Notes (Signed)
0100 - [facility] [place]. Patient here for hyperglycemia. Labs ok, bicarb ok. Sugars improved with fluids, no need for insulin. Stable for discharge.  Elwin MochaBlair Willman Cuny, MD 08/31/14 [phone]

## 2014-08-31 NOTE — Discharge Instructions (Signed)
Please read and follow all provided instructions.  Your diagnoses today include:  1. Hyperglycemia     Tests performed today include:  Blood counts and electrolytes - elevated blood sugar  Urine test - suggests dehydration  Vital signs. See below for your results today.   Medications prescribed:   None  Take any prescribed medications only as directed.  Home care instructions:  Follow any educational materials contained in this packet.  BE VERY CAREFUL not to take multiple medicines containing Tylenol (also called acetaminophen). Doing so can lead to an overdose which can damage your liver and cause liver failure and possibly death.   Follow-up instructions: Please follow-up with your primary care provider in the next 3 days for further evaluation of your symptoms.   Return instructions:   Please return to the Emergency Department if you experience worsening symptoms.   Please return if you have any other emergent concerns.  Additional Information:  Your vital signs today were: BP 150/61 mmHg   Pulse 89   Temp(Src) 98.7 F (37.1 C) (Oral)   Resp 19   Ht 5\' 9"  (1.753 m)   Wt 280 lb (127.007 kg)   BMI 41.33 kg/m2   SpO2 100% If your blood pressure (BP) was elevated above 135/85 this visit, please have this repeated by your doctor within one month. --------------   Hyperglycemia Hyperglycemia occurs when the glucose (sugar) in your blood is too high. Hyperglycemia can happen for many reasons, but it most often happens to people who do not know they have diabetes or are not managing their diabetes properly.  CAUSES  Whether you have diabetes or not, there are other causes of hyperglycemia. Hyperglycemia can occur when you have diabetes, but it can also occur in other situations that you might not be as aware of, such as: Diabetes  If you have diabetes and are having problems controlling your blood glucose, hyperglycemia could occur because of some of the following  reasons:  Not following your meal plan.  Not taking your diabetes medications or not taking it properly.  Exercising less or doing less activity than you normally do.  Being sick. Pre-diabetes  This cannot be ignored. Before people develop Type 2 diabetes, they almost always have "pre-diabetes." This is when your blood glucose levels are higher than normal, but not yet high enough to be diagnosed as diabetes. Research has shown that some long-term damage to the body, especially the heart and circulatory system, may already be occurring during pre-diabetes. If you take action to manage your blood glucose when you have pre-diabetes, you may delay or prevent Type 2 diabetes from developing. Stress  If you have diabetes, you may be "diet" controlled or on oral medications or insulin to control your diabetes. However, you may find that your blood glucose is higher than usual in the hospital whether you have diabetes or not. This is often referred to as "stress hyperglycemia." Stress can elevate your blood glucose. This happens because of hormones put out by the body during times of stress. If stress has been the cause of your high blood glucose, it can be followed regularly by your caregiver. That way he/she can make sure your hyperglycemia does not continue to get worse or progress to diabetes. Steroids  Steroids are medications that act on the infection fighting system (immune system) to block inflammation or infection. One side effect can be a rise in blood glucose. Most people can produce enough extra insulin to allow for this rise,  but for those who cannot, steroids make blood glucose levels go even higher. It is not unusual for steroid treatments to "uncover" diabetes that is developing. It is not always possible to determine if the hyperglycemia will go away after the steroids are stopped. A special blood test called an A1c is sometimes done to determine if your blood glucose was elevated before  the steroids were started. SYMPTOMS  Thirsty.  Frequent urination.  Dry mouth.  Blurred vision.  Tired or fatigue.  Weakness.  Sleepy.  Tingling in feet or leg. DIAGNOSIS  Diagnosis is made by monitoring blood glucose in one or all of the following ways:  A1c test. This is a chemical found in your blood.  Fingerstick blood glucose monitoring.  Laboratory results. TREATMENT  First, knowing the cause of the hyperglycemia is important before the hyperglycemia can be treated. Treatment may include, but is not be limited to:  Education.  Change or adjustment in medications.  Change or adjustment in meal plan.  Treatment for an illness, infection, etc.  More frequent blood glucose monitoring.  Change in exercise plan.  Decreasing or stopping steroids.  Lifestyle changes. HOME CARE INSTRUCTIONS   Test your blood glucose as directed.  Exercise regularly. Your caregiver will give you instructions about exercise. Pre-diabetes or diabetes which comes on with stress is helped by exercising.  Eat wholesome, balanced meals. Eat often and at regular, fixed times. Your caregiver or nutritionist will give you a meal plan to guide your sugar intake.  Being at an ideal weight is important. If needed, losing as little as 10 to 15 pounds may help improve blood glucose levels. SEEK MEDICAL CARE IF:   You have questions about medicine, activity, or diet.  You continue to have symptoms (problems such as increased thirst, urination, or weight gain). SEEK IMMEDIATE MEDICAL CARE IF:   You are vomiting or have diarrhea.  Your breath smells fruity.  You are breathing faster or slower.  You are very sleepy or incoherent.  You have numbness, tingling, or pain in your feet or hands.  You have chest pain.  Your symptoms get worse even though you have been following your caregiver's orders.  If you have any other questions or concerns. Document Released: 03/17/2001 Document  Revised: 12/14/2011 Document Reviewed: 01/18/2012 [facility] Patient Information 2015 [place], [place]. This information is not intended to replace advice given to you by your health care provider. Make sure you discuss any questions you have with your health care provider.

## 2014-11-22 ENCOUNTER — Ambulatory Visit: Payer: Self-pay | Admitting: Internal Medicine

## 2014-12-06 ENCOUNTER — Ambulatory Visit: Payer: Self-pay | Attending: Internal Medicine | Admitting: Internal Medicine

## 2014-12-06 ENCOUNTER — Encounter: Payer: Self-pay | Admitting: Internal Medicine

## 2014-12-06 VITALS — BP 142/77 | HR 76 | Temp 98.0°F | Resp 16 | Wt 287.8 lb

## 2014-12-06 DIAGNOSIS — E1140 Type 2 diabetes mellitus with diabetic neuropathy, unspecified: Secondary | ICD-10-CM | POA: Insufficient documentation

## 2014-12-06 DIAGNOSIS — Z794 Long term (current) use of insulin: Secondary | ICD-10-CM | POA: Insufficient documentation

## 2014-12-06 DIAGNOSIS — E1121 Type 2 diabetes mellitus with diabetic nephropathy: Secondary | ICD-10-CM

## 2014-12-06 DIAGNOSIS — E118 Type 2 diabetes mellitus with unspecified complications: Secondary | ICD-10-CM

## 2014-12-06 LAB — POCT GLYCOSYLATED HEMOGLOBIN (HGB A1C): HEMOGLOBIN A1C: 10.3

## 2014-12-06 LAB — GLUCOSE, POCT (MANUAL RESULT ENTRY): POC Glucose: 210 mg/dl — AB (ref 70–99)

## 2014-12-06 MED ORDER — LISINOPRIL 5 MG PO TABS: 5.0000 mg | ORAL_TABLET | Freq: Every day | ORAL | Status: DC

## 2014-12-06 MED ORDER — INSULIN GLARGINE 100 UNIT/ML SOLOSTAR PEN: 40.0000 [IU] | PEN_INJECTOR | Freq: Every day | SUBCUTANEOUS | Status: DC

## 2014-12-06 MED ORDER — INSULIN ASPART 100 UNIT/ML ~~LOC~~ SOLN: SUBCUTANEOUS | Status: DC

## 2014-12-06 NOTE — Patient Instructions (Signed)
Basic Carbohydrate Counting for Diabetes Mellitus Carbohydrate counting is a method for keeping track of the amount of carbohydrates you eat. Eating carbohydrates naturally increases the level of sugar (glucose) in your blood, so it is important for you to know the amount that is okay for you to have in every meal. Carbohydrate counting helps keep the level of glucose in your blood within normal limits. The amount of carbohydrates allowed is different for every person. A dietitian can help you calculate the amount that is right for you. Once you know the amount of carbohydrates you can have, you can count the carbohydrates in the foods you want to eat. Carbohydrates are found in the following foods:  Grains, such as breads and cereals.  Dried beans and soy products.  Starchy vegetables, such as potatoes, peas, and corn.  Fruit and fruit juices.  Milk and yogurt.  Sweets and snack foods, such as cake, cookies, candy, chips, soft drinks, and fruit drinks. CARBOHYDRATE COUNTING There are two ways to count the carbohydrates in your food. You can use either of the methods or a combination of both. Reading the "Nutrition Facts" on Packaged Food The "Nutrition Facts" is an area that is included on the labels of almost all packaged food and beverages in the [place]. It includes the serving size of that food or beverage and information about the nutrients in each serving of the food, including the grams (g) of carbohydrate per serving.  Decide the number of servings of this food or beverage that you will be able to eat or drink. Multiply that number of servings by the number of grams of carbohydrate that is listed on the label for that serving. The total will be the amount of carbohydrates you will be having when you eat or drink this food or beverage. Learning Standard Serving Sizes of Food When you eat food that is not packaged or does not include "Nutrition Facts" on the label, you need to  measure the servings in order to count the amount of carbohydrates.A serving of most carbohydrate-rich foods contains about 15 g of carbohydrates. The following list includes serving sizes of carbohydrate-rich foods that provide 15 g ofcarbohydrate per serving:   1 slice of bread (1 oz) or 1 six-inch tortilla.    of a hamburger bun or English muffin.  4-6 crackers.   cup unsweetened dry cereal.    cup hot cereal.   cup rice or pasta.    cup mashed potatoes or  of a large baked potato.  1 cup fresh fruit or one small piece of fruit.    cup canned or frozen fruit or fruit juice.  1 cup milk.   cup plain fat-free yogurt or yogurt sweetened with artificial sweeteners.   cup cooked dried beans or starchy vegetable, such as peas, corn, or potatoes.  Decide the number of standard-size servings that you will eat. Multiply that number of servings by 15 (the grams of carbohydrates in that serving). For example, if you eat 2 cups of strawberries, you will have eaten 2 servings and 30 g of carbohydrates (2 servings x 15 g = 30 g). For foods such as soups and casseroles, in which more than one food is mixed in, you will need to count the carbohydrates in each food that is included. EXAMPLE OF CARBOHYDRATE COUNTING Sample Dinner  3 oz chicken breast.   cup of brown rice.   cup of corn.  1 cup milk.   1 cup strawberries with   sugar-free whipped topping.  Carbohydrate Calculation Step 1: Identify the foods that contain carbohydrates:   Rice.   Corn.   Milk.   Strawberries. Step 2:Calculate the number of servings eaten of each:   2 servings of rice.   1 serving of corn.   1 serving of milk.   1 serving of strawberries. Step 3: Multiply each of those number of servings by 15 g:   2 servings of rice x 15 g = 30 g.   1 serving of corn x 15 g = 15 g.   1 serving of milk x 15 g = 15 g.   1 serving of strawberries x 15 g = 15 g. Step 4: Add  together all of the amounts to find the total grams of carbohydrates eaten: 30 g + 15 g + 15 g + 15 g = 75 g. Document Released: 09/21/2005 Document Revised: 02/05/2014 Document Reviewed: 08/18/2013 [employer] Patient Information 2015 [employer]. This information is not intended to replace advice given to you by your health care provider. Make sure you discuss any questions you have with your health care provider. Diabetes and Exercise Exercising regularly is important. It is not just about losing weight. It has many health benefits, such as:  Improving your overall fitness, flexibility, and endurance.  Increasing your bone density.  Helping with weight control.  Decreasing your body fat.  Increasing your muscle strength.  Reducing stress and tension.  Improving your overall health. People with diabetes who exercise gain additional benefits because exercise:  Reduces appetite.  Improves the body's use of blood sugar (glucose).  Helps lower or control blood glucose.  Decreases blood pressure.  Helps control blood lipids (such as cholesterol and triglycerides).  Improves the body's use of the hormone insulin by:  Increasing the body's insulin sensitivity.  Reducing the body's insulin needs.  Decreases the risk for heart disease because exercising:  Lowers cholesterol and triglycerides levels.  Increases the levels of good cholesterol (such as high-density lipoproteins [HDL]) in the body.  Lowers blood glucose levels. YOUR ACTIVITY PLAN  Choose an activity that you enjoy and set realistic goals. Your health care provider or diabetes educator can help you make an activity plan that works for you. Exercise regularly as directed by your health care provider. This includes:  Performing resistance training twice a week such as push-ups, sit-ups, lifting weights, or using resistance bands.  Performing 150 minutes of cardio exercises each week such as walking, running, or  playing sports.  Staying active and spending no more than 90 minutes at one time being inactive. Even short bursts of exercise are good for you. Three 10-minute sessions spread throughout the day are just as beneficial as a single 30-minute session. Some exercise ideas include:  Taking the dog for a walk.  Taking the stairs instead of the elevator.  Dancing to your favorite song.  Doing an exercise video.  Doing your favorite exercise with a friend. RECOMMENDATIONS FOR EXERCISING WITH TYPE 1 OR TYPE 2 DIABETES   Check your blood glucose before exercising. If blood glucose levels are greater than 240 mg/dL, check for urine ketones. Do not exercise if ketones are present.  Avoid injecting insulin into areas of the body that are going to be exercised. For example, avoid injecting insulin into:  The arms when playing tennis.  The legs when jogging.  Keep a record of:  Food intake before and after you exercise.  Expected peak times of insulin action.  Blood   glucose levels before and after you exercise.  The type and amount of exercise you have done.  Review your records with your health care provider. Your health care provider will help you to develop guidelines for adjusting food intake and insulin amounts before and after exercising.  If you take insulin or oral hypoglycemic agents, watch for signs and symptoms of hypoglycemia. They include:  Dizziness.  Shaking.  Sweating.  Chills.  Confusion.  Drink plenty of water while you exercise to prevent dehydration or heat stroke. Body water is lost during exercise and must be replaced.  Talk to your health care provider before starting an exercise program to make sure it is safe for you. Remember, almost any type of activity is better than none. Document Released: 12/12/2003 Document Revised: 02/05/2014 Document Reviewed: 02/28/2013 ExitCare Patient Information 2015 [employer]. This information is not intended to  replace advice given to you by your health care provider. Make sure you discuss any questions you have with your health care provider.  

## 2014-12-06 NOTE — Progress Notes (Signed)
Patient here for follow up on his diabetes  And medications refills

## 2014-12-06 NOTE — Progress Notes (Signed)
Patient ID: Brent Costa, male   DOB: April 27, 1991, 24 y.o.   MRN: [id number]   Brent Costa, is a 24 y.o. male  [id number]  [id number]  DOB - April 27, 1991  Chief Complaint  Patient presents with  . Follow-up        Subjective:   Brent Costa is a 24 y.o. male here today for a follow up visit. Patient has type 1 diabetes mellitus, hypertension and acid reflux on insulin and lisinopril, here today for follow-up. He has no new complaint but blood sugar has been running high especially in the mornings sometimes above 250. Patient is currently on Lantus insulin 35 units daily at bedtime and NovoLog to scale. Patient has used about 10 units of NovoLog to cover hyperglycemia during the day most of time. No hypoglycemic episode. Blood pressure is controlled on current medication. Patient has No headache, No chest pain, No abdominal pain - No Nausea, No new weakness tingling or numbness, No Cough - SOB.  No problems updated.  ALLERGIES: No Known Allergies  PAST MEDICAL HISTORY: Past Medical History  Diagnosis Date  . Hypertension   . Acid reflux   . Chronic bronchitis     "get it about q year" (11/20/2013)  . Diabetes mellitus without complication     MEDICATIONS AT HOME: Prior to Admission medications   Medication Sig Start Date End Date Taking? Authorizing Provider  insulin aspart (NOVOLOG) 100 UNIT/ML injection Blood Glucose 150 - 200, give 3 units Blood Glucose 201 - 250, give 5 units Blood Glucose 251 - 300, give 7 units Blood Glucose 301 - 350, give 10 units 12/06/14   Quentin Angstlugbemiga E Caton Popowski, MD  Insulin Glargine (LANTUS) 100 UNIT/ML Solostar Pen Inject 40 Units into the skin daily at 10 pm. 12/06/14   Quentin Angstlugbemiga E Tenicia Gural, MD  Insulin Syringe-Needle U-100 26G X 1/2" 1 ML MISC Dispense as prescribed please 04/09/14   Quentin Angstlugbemiga E Marciana Uplinger, MD  lisinopril (PRINIVIL,ZESTRIL) 5 MG tablet Take 1 tablet (5 mg total) by mouth daily. 12/06/14   Quentin Angstlugbemiga E Madisan Bice, MD  Multiple  Vitamins-Minerals (MULTIVITAMIN WITH MINERALS) tablet Take 1 tablet by mouth daily.    Historical Provider, MD     Objective:   Filed Vitals:   12/06/14 1708  BP: 142/77  Pulse: 76  Temp: 98 F (36.7 C)  Resp: 16  Weight: 287 lb 12.8 oz (130.545 kg)  SpO2: 100%    Exam General appearance : Awake, alert, not in any distress. Speech Clear. Not toxic looking HEENT: Atraumatic and Normocephalic, pupils equally reactive to light and accomodation Neck: supple, no JVD. No cervical lymphadenopathy.  Chest:Good air entry bilaterally, no added sounds  CVS: S1 S2 regular, no murmurs.  Abdomen: Bowel sounds present, Non tender and not distended with no gaurding, rigidity or rebound. 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  Dispense: 90 tablet; Refill: 3  - COMPLETE METABOLIC PANEL WITH GFR - Lipid panel  2. Type 2 diabetes with complication  - insulin aspart (NOVOLOG) 100 UNIT/ML  injection; Blood Glucose 150 - 200, give 3 units Blood Glucose 201 - 250, give 5 units Blood Glucose 251 - 300, give 7 units Blood Glucose 301 - 350, give 10 units  Dispense: 4 vial; Refill: 3  Increase - Insulin Glargine (LANTUS) 100 UNIT/ML Solostar Pen; Inject 40 Units into the skin daily at 10 pm.  Dispense: 15 pen; Refill: 3   Aim for 2-3 Carb Choices per meal (30-45 grams) +/- 1 either way  Aim for 0-15 Carbs per snack if hungry  Include protein in moderation with your meals and snacks  Consider reading food labels for Total Carbohydrate and Fat Grams of foods    Consider checking BG at alternate times per day  Continue taking medication as directed Fruit Punch - find one with no sugar  Measure and decrease portions of carbohydrate foods  Make your plate and don't go back for seconds  Return in about 2 weeks (around 12/20/2014) for CBG, Lab/Nurse Visit.  The patient was given clear instructions to go to ER or return to medical center if symptoms don't improve, worsen or new problems develop. The patient verbalized understanding. The patient was told to call to get lab results if they haven't heard anything in the next week.   This note has been created with Education officer, environmental. Any transcriptional errors are unintentional.    Jeanann Lewandowsky, MD, MHA, FACP, FAAP [facility] [place], [place] [phone]   12/06/2014, 5:25 PM

## 2014-12-07 LAB — COMPLETE METABOLIC PANEL WITH GFR
ALBUMIN: 4.5 g/dL (ref 3.5–5.2)
ALT: 26 U/L (ref 0–53)
AST: 17 U/L (ref 0–37)
Alkaline Phosphatase: 74 U/L (ref 39–117)
BUN: 10 mg/dL (ref 6–23)
CALCIUM: 10 mg/dL (ref 8.4–10.5)
CO2: 24 meq/L (ref 19–32)
Chloride: 100 mEq/L (ref 96–112)
Creat: 0.85 mg/dL (ref 0.50–1.35)
GFR, Est African American: >89 mL/min
GFR, Est Non African American: >89 mL/min
GLUCOSE: 215 mg/dL — AB (ref 70–99)
POTASSIUM: 4.6 meq/L (ref 3.5–5.3)
Sodium: 136 mEq/L (ref 135–145)
Total Bilirubin: 0.7 mg/dL (ref 0.2–1.2)
Total Protein: 7.3 g/dL (ref 6.0–8.3)

## 2014-12-07 LAB — LIPID PANEL
CHOLESTEROL: 173 mg/dL (ref 0–200)
HDL: 25 mg/dL — AB (ref >=40)
Total CHOL/HDL Ratio: 6.9 Ratio
Triglycerides: 966 mg/dL — ABNORMAL HIGH (ref <150)

## 2014-12-14 ENCOUNTER — Telehealth: Payer: Self-pay | Admitting: Emergency Medicine

## 2014-12-14 MED ORDER — FENOFIBRATE 145 MG PO TABS: 145.0000 mg | ORAL_TABLET | Freq: Every day | ORAL | Status: DC

## 2014-12-14 NOTE — Telephone Encounter (Signed)
Left message for pt to call for test results/medication instructions Fenofibrate 145 mg tablet e-scribed to [facility]

## 2014-12-14 NOTE — Telephone Encounter (Signed)
-----   Message from Quentin Angstlugbemiga E Jegede, MD sent at 12/12/2014  5:30 PM EST ----- Please inform patient that his laboratory test result shows very high triglyceride level, a form of cholesterol, needs to be treated. Also,To address this please limit saturated fat to no more than 7% of your calories, limit cholesterol to 200 mg/day, increase fiber and exercise as tolerated. If needed we may add another cholesterol lowering medication to your regimen.   Please call in prescription for fenofibrate 145 mg tablet by mouth daily, 90 tablets with 3 refills.

## 2014-12-20 ENCOUNTER — Ambulatory Visit: Payer: Self-pay | Attending: Internal Medicine | Admitting: *Deleted

## 2014-12-20 VITALS — BP 144/70 | HR 100 | Temp 98.3°F | Resp 18

## 2014-12-20 DIAGNOSIS — E119 Type 2 diabetes mellitus without complications: Secondary | ICD-10-CM | POA: Insufficient documentation

## 2014-12-20 DIAGNOSIS — Z794 Long term (current) use of insulin: Secondary | ICD-10-CM | POA: Insufficient documentation

## 2014-12-20 DIAGNOSIS — Z87891 Personal history of nicotine dependence: Secondary | ICD-10-CM | POA: Insufficient documentation

## 2014-12-20 DIAGNOSIS — E1165 Type 2 diabetes mellitus with hyperglycemia: Secondary | ICD-10-CM

## 2014-12-20 LAB — POCT CBG (FASTING - GLUCOSE)-MANUAL ENTRY: Glucose Fasting, POC: 245 mg/dL — AB (ref 70–99)

## 2014-12-20 MED ORDER — ACCU-CHEK SOFTCLIX LANCET DEV MISC: Status: DC

## 2014-12-20 MED ORDER — GLUCOSE BLOOD VI STRP: ORAL_STRIP | Status: DC

## 2014-12-20 MED ORDER — ACCU-CHEK AVIVA PLUS W/DEVICE KIT: PACK | Status: DC

## 2014-12-20 NOTE — Progress Notes (Signed)
Patient presents for CBG and record review, however, patient does not own a glucometer. Patient has been injecting 10 units novolog insulin prior to each meal without knowing blood sugar for last 2 months. Discussed in detail importance of knowing blood sugar prior to injecting novolog insulin [facility] Patient instructed to check blood sugars AC and HS. Log given and patient instructed on use and to bring log to all future visits Med list reviewed; patient reports taking all meds as directed Discussed need for low sodium diet and using Mrs. Dash as alternative to salt Encouraged to choose foods with 5% or less of daily value for sodium. Patient playing basketball 2 hours every day for exercise Patient c/o daily headaches rates 3/10 at present after 2 goody powders Also c/o mid-center chest "heaviness" for 3 hours States he's under a lot of stress and "thinking about things" like how to support himself States he has a home and feels safe in his home  PHQ-9 score of 11 GAD 17. Patient referred to in-house LCSW  CBG 245 States he has not eaten yet today and it is 1600. Instructed patient on importance of eating small meals throughout day due to T2DM and that this may be contributing to headaches  BP 144/70 P 101 R 18 T  98.3 oral SpO2 97%  PCP notified of above Patient will return in 2 weeks for nurse visit for BP check and  CBG and record review  Patient advised to call for med refills at least 7 days before running out so as not to go without.  Patient given literature on DASH Eating Plan, Diabetes and Food, Diabetes and Exercise, Basic Carb Counting and hypoglycemia Discussed s/sx of hypoglycemia and corrective actions to take in detail

## 2014-12-20 NOTE — Patient Instructions (Addendum)
Diabetes Mellitus and Food It is important for you to manage your blood sugar (glucose) level. Your blood glucose level can be greatly affected by what you eat. Eating healthier foods in the appropriate amounts throughout the day at about the same time each day will help you control your blood glucose level. It can also help slow or prevent worsening of your diabetes mellitus. Healthy eating may even help you improve the level of your blood pressure and reach or maintain a healthy weight.  HOW CAN FOOD AFFECT ME? Carbohydrates Carbohydrates affect your blood glucose level more than any other type of food. Your dietitian will help you determine how many carbohydrates to eat at each meal and teach you how to count carbohydrates. Counting carbohydrates is important to keep your blood glucose at a healthy level, especially if you are using insulin or taking certain medicines for diabetes mellitus. Alcohol Alcohol can cause sudden decreases in blood glucose (hypoglycemia), especially if you use insulin or take certain medicines for diabetes mellitus. Hypoglycemia can be a life-threatening condition. Symptoms of hypoglycemia (sleepiness, dizziness, and disorientation) are similar to symptoms of having too much alcohol.  If your health care provider has given you approval to drink alcohol, do so in moderation and use the following guidelines:  Women should not have more than one drink per day, and men should not have more than two drinks per day. One drink is equal to:  12 oz of beer.  5 oz of wine.  1 oz of hard liquor.  Do not drink on an empty stomach.  Keep yourself hydrated. Have water, diet soda, or unsweetened iced tea.  Regular soda, juice, and other mixers might contain a lot of carbohydrates and should be counted. WHAT FOODS ARE NOT RECOMMENDED? As you make food choices, it is important to remember that all foods are not the same. Some foods have fewer nutrients per serving than other  foods, even though they might have the same number of calories or carbohydrates. It is difficult to get your body what it needs when you eat foods with fewer nutrients. Examples of foods that you should avoid that are high in calories and carbohydrates but low in nutrients include:  Trans fats (most processed foods list trans fats on the Nutrition Facts label).  Regular soda.  Juice.  Candy.  Sweets, such as cake, pie, doughnuts, and cookies.  Fried foods. WHAT FOODS CAN I EAT? Have nutrient-rich foods, which will nourish your body and keep you healthy. The food you should eat also will depend on several factors, including:  The calories you need.  The medicines you take.  Your weight.  Your blood glucose level.  Your blood pressure level.  Your cholesterol level. You also should eat a variety of foods, including:  Protein, such as meat, poultry, fish, tofu, nuts, and seeds (lean animal proteins are best).  Fruits.  Vegetables.  Dairy products, such as milk, cheese, and yogurt (low fat is best).  Breads, grains, pasta, cereal, rice, and beans.  Fats such as olive oil, trans fat-free margarine, canola oil, avocado, and olives. DOES EVERYONE WITH DIABETES MELLITUS HAVE THE SAME MEAL PLAN? Because every person with diabetes mellitus is different, there is not one meal plan that works for everyone. It is very important that you meet with a dietitian who will help you create a meal plan that is just right for you. Document Released: 06/18/2005 Document Revised: 09/26/2013 Document Reviewed: 08/18/2013 [employer] Patient Information 2015 [employer]. This   information is not intended to replace advice given to you by your health care provider. Make sure you discuss any questions you have with your health care provider. Diabetes and Exercise Exercising regularly is important. It is not just about losing weight. It has many health benefits, such as:  Improving your overall  fitness, flexibility, and endurance.  Increasing your bone density.  Helping with weight control.  Decreasing your body fat.  Increasing your muscle strength.  Reducing stress and tension.  Improving your overall health. People with diabetes who exercise gain additional benefits because exercise:  Reduces appetite.  Improves the body's use of blood sugar (glucose).  Helps lower or control blood glucose.  Decreases blood pressure.  Helps control blood lipids (such as cholesterol and triglycerides).  Improves the body's use of the hormone insulin by:  Increasing the body's insulin sensitivity.  Reducing the body's insulin needs.  Decreases the risk for heart disease because exercising:  Lowers cholesterol and triglycerides levels.  Increases the levels of good cholesterol (such as high-density lipoproteins [HDL]) in the body.  Lowers blood glucose levels. YOUR ACTIVITY PLAN  Choose an activity that you enjoy and set realistic goals. Your health care provider or diabetes educator can help you make an activity plan that works for you. Exercise regularly as directed by your health care provider. This includes:  Performing resistance training twice a week such as push-ups, sit-ups, lifting weights, or using resistance bands.  Performing 150 minutes of cardio exercises each week such as walking, running, or playing sports.  Staying active and spending no more than 90 minutes at one time being inactive. Even short bursts of exercise are good for you. Three 10-minute sessions spread throughout the day are just as beneficial as a single 30-minute session. Some exercise ideas include:  Taking the dog for a walk.  Taking the stairs instead of the elevator.  Dancing to your favorite song.  Doing an exercise video.  Doing your favorite exercise with a friend. RECOMMENDATIONS FOR EXERCISING WITH TYPE 1 OR TYPE 2 DIABETES   Check your blood glucose before exercising. If  blood glucose levels are greater than 240 mg/dL, check for urine ketones. Do not exercise if ketones are present.  Avoid injecting insulin into areas of the body that are going to be exercised. For example, avoid injecting insulin into:  The arms when playing tennis.  The legs when jogging.  Keep a record of:  Food intake before and after you exercise.  Expected peak times of insulin action.  Blood glucose levels before and after you exercise.  The type and amount of exercise you have done.  Review your records with your health care provider. Your health care provider will help you to develop guidelines for adjusting food intake and insulin amounts before and after exercising.  If you take insulin or oral hypoglycemic agents, watch for signs and symptoms of hypoglycemia. They include:  Dizziness.  Shaking.  Sweating.  Chills.  Confusion.  Drink plenty of water while you exercise to prevent dehydration or heat stroke. Body water is lost during exercise and must be replaced.  Talk to your health care provider before starting an exercise program to make sure it is safe for you. Remember, almost any type of activity is better than none. Document Released: 12/12/2003 Document Revised: 02/05/2014 Document Reviewed: 02/28/2013 Exit[employer] Patient Information 2015 [employer]. This information is not intended to replace advice given to you by your health care provider. Make sure you discuss any   questions you have with your health care provider. Basic Carbohydrate Counting for Diabetes Mellitus Carbohydrate counting is a method for keeping track of the amount of carbohydrates you eat. Eating carbohydrates naturally increases the level of sugar (glucose) in your blood, so it is important for you to know the amount that is okay for you to have in every meal. Carbohydrate counting helps keep the level of glucose in your blood within normal limits. The amount of carbohydrates allowed is  different for every person. A dietitian can help you calculate the amount that is right for you. Once you know the amount of carbohydrates you can have, you can count the carbohydrates in the foods you want to eat. Carbohydrates are found in the following foods:  Grains, such as breads and cereals.  Dried beans and soy products.  Starchy vegetables, such as potatoes, peas, and corn.  Fruit and fruit juices.  Milk and yogurt.  Sweets and snack foods, such as cake, cookies, candy, chips, soft drinks, and fruit drinks. CARBOHYDRATE COUNTING There are two ways to count the carbohydrates in your food. You can use either of the methods or a combination of both. Reading the "Nutrition Facts" on Packaged Food The "Nutrition Facts" is an area that is included on the labels of almost all packaged food and beverages in the [place]. It includes the serving size of that food or beverage and information about the nutrients in each serving of the food, including the grams (g) of carbohydrate per serving.  Decide the number of servings of this food or beverage that you will be able to eat or drink. Multiply that number of servings by the number of grams of carbohydrate that is listed on the label for that serving. The total will be the amount of carbohydrates you will be having when you eat or drink this food or beverage. Learning Standard Serving Sizes of Food When you eat food that is not packaged or does not include "Nutrition Facts" on the label, you need to measure the servings in order to count the amount of carbohydrates.A serving of most carbohydrate-rich foods contains about 15 g of carbohydrates. The following list includes serving sizes of carbohydrate-rich foods that provide 15 g ofcarbohydrate per serving:   1 slice of bread (1 oz) or 1 six-inch tortilla.    of a hamburger bun or English muffin.  4-6 crackers.   cup unsweetened dry cereal.    cup hot cereal.   cup rice or  pasta.    cup mashed potatoes or  of a large baked potato.  1 cup fresh fruit or one small piece of fruit.    cup canned or frozen fruit or fruit juice.  1 cup milk.   cup plain fat-free yogurt or yogurt sweetened with artificial sweeteners.   cup cooked dried beans or starchy vegetable, such as peas, corn, or potatoes.  Decide the number of standard-size servings that you will eat. Multiply that number of servings by 15 (the grams of carbohydrates in that serving). For example, if you eat 2 cups of strawberries, you will have eaten 2 servings and 30 g of carbohydrates (2 servings x 15 g = 30 g). For foods such as soups and casseroles, in which more than one food is mixed in, you will need to count the carbohydrates in each food that is included. EXAMPLE OF CARBOHYDRATE COUNTING Sample Dinner  3 oz chicken breast.   cup of brown rice.   cup of corn.    1 cup milk.   1 cup strawberries with sugar-free whipped topping.  Carbohydrate Calculation Step 1: Identify the foods that contain carbohydrates:   Rice.   Corn.   Milk.   Strawberries. Step 2:Calculate the number of servings eaten of each:   2 servings of rice.   1 serving of corn.   1 serving of milk.   1 serving of strawberries. Step 3: Multiply each of those number of servings by 15 g:   2 servings of rice x 15 g = 30 g.   1 serving of corn x 15 g = 15 g.   1 serving of milk x 15 g = 15 g.   1 serving of strawberries x 15 g = 15 g. Step 4: Add together all of the amounts to find the total grams of carbohydrates eaten: 30 g + 15 g + 15 g + 15 g = 75 g. Document Released: 09/21/2005 Document Revised: 02/05/2014 Document Reviewed: 08/18/2013 [facility] Patient Information 2015 [place], [place]. This information is not intended to replace advice given to you by your health care provider. Make sure you discuss any questions you have with your health care provider. DASH Eating Plan DASH stands  for "Dietary Approaches to Stop Hypertension." The DASH eating plan is a healthy eating plan that has been shown to reduce high blood pressure (hypertension). Additional health benefits may include reducing the risk of type 2 diabetes mellitus, heart disease, and stroke. The DASH eating plan may also help with weight loss. WHAT DO I NEED TO KNOW ABOUT THE DASH EATING PLAN? For the DASH eating plan, you will follow these general guidelines:  Choose foods with a percent daily value for sodium of less than 5% (as listed on the food label).  Use salt-free seasonings or herbs instead of table salt or sea salt.  Check with your health care provider or pharmacist before using salt substitutes.  Eat lower-sodium products, often labeled as "lower sodium" or "no salt added."  Eat fresh foods.  Eat more vegetables, fruits, and low-fat dairy products.  Choose whole grains. Look for the word "whole" as the first word in the ingredient list.  Choose fish and skinless chicken or [place] more often than red meat. Limit fish, poultry, and meat to 6 oz (170 g) each day.  Limit sweets, desserts, sugars, and sugary drinks.  Choose heart-healthy fats.  Limit cheese to 1 oz (28 g) per day.  Eat more home-cooked food and less restaurant, buffet, and fast food.  Limit fried foods.  Cook foods using methods other than frying.  Limit canned vegetables. If you do use them, rinse them well to decrease the sodium.  When eating at a restaurant, ask that your food be prepared with less salt, or no salt if possible. WHAT FOODS CAN I EAT? Seek help from a dietitian for individual calorie needs. Grains Whole grain or whole wheat bread. Brown rice. Whole grain or whole wheat pasta. Quinoa, bulgur, and whole grain cereals. Low-sodium cereals. Corn or whole wheat flour tortillas. Whole grain cornbread. Whole grain crackers. Low-sodium crackers. Vegetables Fresh or frozen vegetables (raw, steamed, roasted, or  grilled). Low-sodium or reduced-sodium tomato and vegetable juices. Low-sodium or reduced-sodium tomato sauce and paste. Low-sodium or reduced-sodium canned vegetables.  Fruits All fresh, canned (in natural juice), or frozen fruits. Meat and Other Protein Products Ground beef (85% or leaner), grass-fed beef, or beef trimmed of fat. Skinless chicken or [place]. Ground chicken or [place]. Pork trimmed of fat. All  fish and seafood. Eggs. Dried beans, peas, or lentils. Unsalted nuts and seeds. Unsalted canned beans. Dairy Low-fat dairy products, such as skim or 1% milk, 2% or reduced-fat cheeses, low-fat ricotta or cottage cheese, or plain low-fat yogurt. Low-sodium or reduced-sodium cheeses. Fats and Oils Tub margarines without trans fats. Light or reduced-fat mayonnaise and salad dressings (reduced sodium). Avocado. Safflower, olive, or canola oils. Natural peanut or almond butter. Other Unsalted popcorn and pretzels. The items listed above may not be a complete list of recommended foods or beverages. Contact your dietitian for more options. WHAT FOODS ARE NOT RECOMMENDED? Grains White bread. White pasta. White rice. Refined cornbread. Bagels and croissants. Crackers that contain trans fat. Vegetables Creamed or fried vegetables. Vegetables in a cheese sauce. Regular canned vegetables. Regular canned tomato sauce and paste. Regular tomato and vegetable juices. Fruits Dried fruits. Canned fruit in light or heavy syrup. Fruit juice. Meat and Other Protein Products Fatty cuts of meat. Ribs, chicken wings, bacon, sausage, bologna, salami, chitterlings, fatback, hot dogs, bratwurst, and packaged luncheon meats. Salted nuts and seeds. Canned beans with salt. Dairy Whole or 2% milk, cream, half-and-half, and cream cheese. Whole-fat or sweetened yogurt. Full-fat cheeses or blue cheese. Nondairy creamers and whipped toppings. Processed cheese, cheese spreads, or cheese curds. Condiments Onion and garlic  salt, seasoned salt, table salt, and sea salt. Canned and packaged gravies. Worcestershire sauce. Tartar sauce. Barbecue sauce. Teriyaki sauce. Soy sauce, including reduced sodium. Steak sauce. Fish sauce. Oyster sauce. Cocktail sauce. Horseradish. Ketchup and mustard. Meat flavorings and tenderizers. Bouillon cubes. Hot sauce. Tabasco sauce. Marinades. Taco seasonings. Relishes. Fats and Oils Butter, stick margarine, lard, shortening, ghee, and bacon fat. Coconut, palm kernel, or palm oils. Regular salad dressings. Other Pickles and olives. Salted popcorn and pretzels. The items listed above may not be a complete list of foods and beverages to avoid. Contact your dietitian for more information. WHERE CAN I FIND MORE INFORMATION? National Heart, Lung, and Blood Institute: [employer] Document Released: 09/10/2011 Document Revised: 02/05/2014 Document Reviewed: 07/26/2013 [facility] Patient Information 2015 [place], [place]. This information is not intended to replace advice given to you by your health care provider. Make sure you discuss any questions you have with your health care provider. Hypoglycemia Hypoglycemia occurs when the glucose in your blood is too low. Glucose is a type of sugar that is your body's main energy source. Hormones, such as insulin and glucagon, control the level of glucose in the blood. Insulin lowers blood glucose and glucagon increases blood glucose. Having too much insulin in your blood stream, or not eating enough food containing sugar, can result in hypoglycemia. Hypoglycemia can happen to people with or without diabetes. It can develop quickly and can be a medical emergency.  CAUSES   Missing or delaying meals.  Not eating enough carbohydrates at meals.  Taking too much diabetes medicine.  Not timing your oral diabetes medicine or insulin doses with meals, snacks, and exercise.  Nausea and vomiting.  Certain  medicines.  Severe illnesses, such as hepatitis, kidney disorders, and certain eating disorders.  Increased activity or exercise without eating something extra or adjusting medicines.  Drinking too much alcohol.  A nerve disorder that affects body functions like your heart rate, blood pressure, and digestion (autonomic neuropathy).  A condition where the stomach muscles do not function properly (gastroparesis). Therefore, medicines and food may not absorb properly.  Rarely, a tumor of the pancreas can produce too much insulin. SYMPTOMS   Hunger.  Sweating (diaphoresis).  Change in body temperature.  Shakiness.  Headache.  Anxiety.  Lightheadedness.  Irritability.  Difficulty concentrating.  Dry mouth.  Tingling or numbness in the hands or feet.  Restless sleep or sleep disturbances.  Altered speech and coordination.  Change in mental status.  Seizures or prolonged convulsions.  Combativeness.  Drowsiness (lethargic).  Weakness.  Increased heart rate or palpitations.  Confusion.  Pale, gray skin color.  Blurred or double vision.  Fainting. DIAGNOSIS  A physical exam and medical history will be performed. Your caregiver may make a diagnosis based on your symptoms. Blood tests and other lab tests may be performed to confirm a diagnosis. Once the diagnosis is made, your caregiver will see if your signs and symptoms go away once your blood glucose is raised.  TREATMENT  Usually, you can easily treat your hypoglycemia when you notice symptoms.  Check your blood glucose. If it is less than 70 mg/dl, take one of the following:   3-4 glucose tablets.    cup juice.    cup regular soda.   1 cup skim milk.   -1 tube of glucose gel.   5-6 hard candies.   Avoid high-fat drinks or food that may delay a rise in blood glucose levels.  Do not take more than the recommended amount of sugary foods, drinks, gel, or tablets. Doing so will cause  your blood glucose to go too high.   Wait 10-15 minutes and recheck your blood glucose. If it is still less than 70 mg/dl or below your target range, repeat treatment.   Eat a snack if it is more than 1 hour until your next meal.  There may be a time when your blood glucose may go so low that you are unable to treat yourself at home when you start to notice symptoms. You may need someone to help you. You may even faint or be unable to swallow. If you cannot treat yourself, someone will need to bring you to the hospital.  HOME CARE INSTRUCTIONS  If you have diabetes, follow your diabetes management plan by:  Taking your medicines as directed.  Following your exercise plan.  Following your meal plan. Do not skip meals. Eat on time.  Testing your blood glucose regularly. Check your blood glucose before and after exercise. If you exercise longer or different than usual, be sure to check blood glucose more frequently.  Wearing your medical alert jewelry that says you have diabetes.  Identify the cause of your hypoglycemia. Then, develop ways to prevent the recurrence of hypoglycemia.  Do not take a hot bath or shower right after an insulin shot.  Always carry treatment with you. Glucose tablets are the easiest to carry.  If you are going to drink alcohol, drink it only with meals.  Tell friends or family members ways to keep you safe during a seizure. This may include removing hard or sharp objects from the area or turning you on your side.  Maintain a healthy weight. SEEK MEDICAL CARE IF:   You are having problems keeping your blood glucose in your target range.  You are having frequent episodes of hypoglycemia.  You feel you might be having side effects from your medicines.  You are not sure why your blood glucose is dropping so low.  You notice a change in vision or a new problem with your vision. SEEK IMMEDIATE MEDICAL CARE IF:   Confusion develops.  A change in mental  status occurs.  The inability to swallow  develops.  Fainting occurs. Document Released: 09/21/2005 Document Revised: 09/26/2013 Document Reviewed: 01/18/2012 [facility] Patient Information 2015 [place]. This information is not intended to replace advice given to you by your health care provider. Make sure you discuss any questions you have with your health care provider.

## 2015-01-07 ENCOUNTER — Ambulatory Visit: Payer: Self-pay | Attending: Internal Medicine | Admitting: *Deleted

## 2015-01-07 VITALS — BP 110/68 | HR 83 | Temp 98.2°F | Resp 18

## 2015-01-07 DIAGNOSIS — E1165 Type 2 diabetes mellitus with hyperglycemia: Secondary | ICD-10-CM | POA: Insufficient documentation

## 2015-01-07 LAB — GLUCOSE, POCT (MANUAL RESULT ENTRY): POC Glucose: 222 mg/dl — AB (ref 70–99)

## 2015-01-07 MED ORDER — METFORMIN HCL 500 MG PO TABS: 500.0000 mg | ORAL_TABLET | Freq: Two times a day (BID) | ORAL | Status: DC

## 2015-01-07 NOTE — Progress Notes (Signed)
Patient presents for CBG and record review, however, patient did not bring log or meter Med list reviewed; patient reports taking all meds as directed Patient reports AM fasting blood sugars ranging 173-256 Patient reports before lunch blood sugars ranging 230-250 Patient reports before dinner blood sugars ranging 169-240 Patient reports before bed blood sugars ranging 190-212 Playing 2 hours of basketball after lunch and before dinner every day Patient states changed diet. No longer skipping meals States no longer having headaches or chest heaviness  CBG 222 2 hours after meal  BP 110/68 P 83 R 18 T  98.2 oral SpO2 96%  Per provider: Add metformin 500 mg bid. Patient made aware of potential GI side effects  Patient to return in 2 weeks for nurse visit for CBG and record review  Patient advised to call for med refills at least 7 days before running out so as not to go without.

## 2015-01-08 ENCOUNTER — Telehealth: Payer: Self-pay

## 2015-01-08 NOTE — Telephone Encounter (Signed)
Left message

## 2015-01-16 ENCOUNTER — Ambulatory Visit: Payer: Self-pay

## 2015-05-08 ENCOUNTER — Telehealth: Payer: Self-pay | Admitting: Internal Medicine

## 2015-05-08 NOTE — Telephone Encounter (Signed)
Patient called to request a med refill for insulin aspart (NOVOLOG) 100 UNIT/ML injection and Insulin Glargine (LANTUS) 100 UNIT/ML Solostar Pen. Please f/u with pt.

## 2015-05-15 ENCOUNTER — Encounter (HOSPITAL_COMMUNITY): Payer: Self-pay | Admitting: Family Medicine

## 2015-05-15 ENCOUNTER — Emergency Department (HOSPITAL_COMMUNITY)
Admission: EM | Admit: 2015-05-15 | Discharge: 2015-05-15 | Disposition: A | Discharge status: Home / Self Care | Payer: Self-pay | Attending: Emergency Medicine | Admitting: Emergency Medicine

## 2015-05-15 DIAGNOSIS — E118 Type 2 diabetes mellitus with unspecified complications: Secondary | ICD-10-CM

## 2015-05-15 DIAGNOSIS — E119 Type 2 diabetes mellitus without complications: Secondary | ICD-10-CM | POA: Insufficient documentation

## 2015-05-15 DIAGNOSIS — Z87891 Personal history of nicotine dependence: Secondary | ICD-10-CM | POA: Insufficient documentation

## 2015-05-15 DIAGNOSIS — Z794 Long term (current) use of insulin: Secondary | ICD-10-CM | POA: Insufficient documentation

## 2015-05-15 DIAGNOSIS — I10 Essential (primary) hypertension: Secondary | ICD-10-CM | POA: Insufficient documentation

## 2015-05-15 DIAGNOSIS — K219 Gastro-esophageal reflux disease without esophagitis: Secondary | ICD-10-CM | POA: Insufficient documentation

## 2015-05-15 DIAGNOSIS — Z79899 Other long term (current) drug therapy: Secondary | ICD-10-CM | POA: Insufficient documentation

## 2015-05-15 DIAGNOSIS — Z8709 Personal history of other diseases of the respiratory system: Secondary | ICD-10-CM | POA: Insufficient documentation

## 2015-05-15 DIAGNOSIS — R21 Rash and other nonspecific skin eruption: Secondary | ICD-10-CM

## 2015-05-15 DIAGNOSIS — Z760 Encounter for issue of repeat prescription: Secondary | ICD-10-CM

## 2015-05-15 LAB — CBG MONITORING, ED: Glucose-Capillary: 296 mg/dL — ABNORMAL HIGH (ref 65–99)

## 2015-05-15 MED ORDER — HYDROXYZINE HCL 25 MG PO TABS: 25.0000 mg | ORAL_TABLET | Freq: Three times a day (TID) | ORAL | Status: DC | PRN

## 2015-05-15 MED ORDER — INSULIN ASPART 100 UNIT/ML ~~LOC~~ SOLN: SUBCUTANEOUS | Status: DC

## 2015-05-15 MED ORDER — INSULIN GLARGINE 100 UNIT/ML SOLOSTAR PEN: 40.0000 [IU] | PEN_INJECTOR | Freq: Every day | SUBCUTANEOUS | Status: DC

## 2015-05-15 MED ORDER — TRIAMCINOLONE ACETONIDE 0.1 % EX CREA: 1.0000 "application " | TOPICAL_CREAM | Freq: Two times a day (BID) | CUTANEOUS | Status: DC

## 2015-05-15 NOTE — ED Provider Notes (Signed)
CSN: [id number]     Arrival date & time 05/15/15  1844 History  This chart was scribed for non-physician practitioner, Debroah Baller, NP, working with Leo Grosser, MD, by Stephania Fragmin, ED Scribe. This patient was seen in room TR08C/TR08C and the patient's care was started at 7:18 PM.    Chief Complaint  Patient presents with  . Rash   Patient is a 24 y.o. male presenting with rash. The history is provided by the patient. No language interpreter was used.  Rash Location:  Shoulder/arm, leg and torso Shoulder/arm rash location:  L arm and R arm Torso rash location:  Lower back Leg rash location:  L leg and R leg Quality: dryness and itchiness   Severity:  Moderate Onset quality:  Gradual Duration:  2 weeks Timing:  Constant Progression:  Spreading Chronicity:  New Context comment:  Patient wonders if due to not taking insulin Relieved by:  None tried Worsened by:  Nothing tried Ineffective treatments:  None tried   HPI Comments: Brent Costa is a 24 y.o. male with a history of DM diagnosed in 2015, who presents to the Emergency Department complaining of a generalized rash presenting as raised, mildly pruritic bumps on his arms, back, and legs that began 2 weeks ago. He states the bumps began on his right knee, moved to his forearms, and then went to his back. This is a new problem. Patient states he thinks this may be a reaction because he hasn't been using insulin in a month due to financial issues. He states he is also unable to schedule an appointment with his PCP. Patient states he last checked his blood sugar 2 weeks ago, which was 473. He denies having any rashes on his chest.  Patient also notes having a URI over the past week, that is gradually improving.  PCP: [facility]  Past Medical History  Diagnosis Date  . Hypertension   . Acid reflux   . Chronic bronchitis     "get it about q year" (11/20/2013)  . Diabetes mellitus without complication    Past Surgical  History  Procedure Laterality Date  . Tonsillectomy and adenoidectomy  ~ 1999  . Esophagogastroduodenoscopy N/A 11/21/2013    Procedure: ESOPHAGOGASTRODUODENOSCOPY (EGD);  Surgeon: Wonda Horner, MD;  Location: [facility] ENDOSCOPY;  Service: Endoscopy;  Laterality: N/A;   Family History  Problem Relation Age of Onset  . Diabetes Mellitus II Father   . Heart failure Father   . Asthma Mother    Social History  Substance Use Topics  . Smoking status: Former Smoker -- 0.50 packs/day for 3 years    Types: Cigarettes    Quit date: 12/11/2013  . Smokeless tobacco: None  . Alcohol Use: No     Comment: 11/20/2013 "only drink at birthdays, parties, holidays"    Review of Systems  Skin: Positive for rash.  All other systems reviewed and are negative.   Allergies  Review of patient's allergies indicates no known allergies.  Home Medications   Prior to Admission medications   Medication Sig Start Date End Date Taking? Authorizing Provider  Blood Glucose Monitoring Suppl (ACCU-CHEK AVIVA PLUS) W/DEVICE KIT Check blood sugars 4 times daily (before meals and at bedtime) 12/20/14   Tresa Garter, MD  fenofibrate (TRICOR) 145 MG tablet Take 1 tablet (145 mg total) by mouth daily. 12/14/14   Tresa Garter, MD  glucose blood (ACCU-CHEK AVIVA) test strip Use as instructed 12/20/14   [name] E Jegede,  MD  hydrOXYzine (ATARAX/VISTARIL) 25 MG tablet Take 1 tablet (25 mg total) by mouth every 8 (eight) hours as needed for itching. 05/15/15   Saria Haran Bunnie Pion, NP  insulin aspart (NOVOLOG) 100 UNIT/ML injection Blood Glucose 150 - 200, give 3 units Blood Glucose 201 - 250, give 5 units Blood Glucose 251 - 300, give 7 units Blood Glucose 301 - 350, give 10 units 05/15/15   Iwalani Templeton Bunnie Pion, NP  Insulin Glargine (LANTUS) 100 UNIT/ML Solostar Pen Inject 40 Units into the skin daily at 10 pm. 05/15/15   Rukia Mcgillivray Bunnie Pion, NP  Insulin Syringe-Needle U-100 26G X 1/2" 1 ML MISC Dispense as prescribed please 04/09/14    Tresa Garter, MD  Lancet Devices (ACCU-CHEK [facility]) lancets Use as instructed 12/20/14   Tresa Garter, MD  lisinopril (PRINIVIL,ZESTRIL) 5 MG tablet Take 1 tablet (5 mg total) by mouth daily. 12/06/14   Tresa Garter, MD  metFORMIN (GLUCOPHAGE) 500 MG tablet Take 1 tablet (500 mg total) by mouth 2 (two) times daily with a meal. 01/07/15   Lance Bosch, NP  Multiple Vitamins-Minerals (MULTIVITAMIN WITH MINERALS) tablet Take 1 tablet by mouth daily.    Historical Provider, MD  triamcinolone cream (KENALOG) 0.1 % Apply 1 application topically 2 (two) times daily. 05/15/15   Kennetta Pavlovic Bunnie Pion, NP   BP 120/72 mmHg  Pulse 70  Temp(Src) 98.6 F (37 C) (Oral)  Resp 14  SpO2 98% Physical Exam  Constitutional: He is oriented to person, place, and time. He appears well-developed and well-nourished. No distress.  HENT:  Head: Normocephalic and atraumatic.  Right Ear: Hearing, tympanic membrane, external ear and ear canal normal.  Left Ear: Hearing, tympanic membrane, external ear and ear canal normal.  Mouth/Throat: Uvula is midline. No posterior oropharyngeal edema or posterior oropharyngeal erythema.  Eyes: Conjunctivae and EOM are normal.  Neck: Neck supple. No tracheal deviation present.  Cardiovascular: Normal rate, regular rhythm and normal heart sounds.   Pulmonary/Chest: Effort normal and breath sounds normal. No respiratory distress. He has no wheezes. He has no rales. He exhibits no tenderness.  Musculoskeletal: Normal range of motion.  Lymphadenopathy:    He has no cervical adenopathy.  Neurological: He is alert and oriented to person, place, and time.  Skin: Skin is warm and dry. Rash noted. Rash is papular.  Papular lesions to the posterior aspect of the bilateral forearms, elbows, and upper arms. There are a few areas noted to the lower back and to the lower legs.   Psychiatric: He has a normal mood and affect. His behavior is normal.  Nursing note and vitals  reviewed.   ED Course  Procedures (including critical care time)  DIAGNOSTIC STUDIES: Oxygen Saturation is 95% on RA, adequate by my interpretation.    COORDINATION OF CARE: 7:25 PM - Discussed treatment plan with pt at bedside which includes CBG check, and pt agreed to plan.   Labs Review Labs Reviewed  CBG MONITORING, ED - Abnormal; Notable for the following:    Glucose-Capillary 296 (*)    All other components within normal limits   7:59 PM - Stressed the importance of sticking to his insulin regimen. Discussed treatment plan with patient which includes Rx anti-itch ointment. Also advised to f/u with [facility]. Pt verbalized understanding and agreed to plan.   10:47 PM - Patient is out of Lantus and Novolog, and does not have the money to get the prescriptions filled. After speaking with the social  worker here, I will write prescriptions and fax them to her; she will take care of getting his medications to him. She has scheduled him a f/u appointment with [facility] for 05/22/15.  MDM  24 y.o. male with rash and itching and need for refill of insulin and help with getting medication filled. Stable for d/c. Discussed with the patient and all questioned fully answered. He will return if any problems arise.   Final diagnoses:  Rash and nonspecific skin eruption    I personally performed the services described in this documentation, which was scribed in my presence. The recorded information has been reviewed and is accurate.     [place], [place] 05/15/15 0349  Leo Grosser, MD 05/16/15 [phone]

## 2015-05-15 NOTE — Discharge Instructions (Signed)
Follow up with your doctor at [facility]. Call tomorrow for follow up

## 2015-05-15 NOTE — ED Notes (Signed)
Pt here for bumps to arms. sts he has been staying at some bodies house.

## 2015-05-22 ENCOUNTER — Encounter: Payer: Self-pay | Admitting: Family Medicine

## 2015-05-22 ENCOUNTER — Ambulatory Visit: Payer: Self-pay | Attending: Family Medicine | Admitting: Family Medicine

## 2015-05-22 VITALS — BP 156/100 | HR 91 | Temp 98.3°F | Ht 69.0 in | Wt 278.0 lb

## 2015-05-22 DIAGNOSIS — R21 Rash and other nonspecific skin eruption: Secondary | ICD-10-CM

## 2015-05-22 DIAGNOSIS — E118 Type 2 diabetes mellitus with unspecified complications: Secondary | ICD-10-CM

## 2015-05-22 DIAGNOSIS — E1165 Type 2 diabetes mellitus with hyperglycemia: Secondary | ICD-10-CM

## 2015-05-22 DIAGNOSIS — I10 Essential (primary) hypertension: Secondary | ICD-10-CM

## 2015-05-22 DIAGNOSIS — E1121 Type 2 diabetes mellitus with diabetic nephropathy: Secondary | ICD-10-CM

## 2015-05-22 LAB — GLUCOSE, POCT (MANUAL RESULT ENTRY)
POC Glucose: 315 mg/dl — AB (ref 70–99)
POC Glucose: 293 mg/dl — AB (ref 70–99)

## 2015-05-22 LAB — POCT GLYCOSYLATED HEMOGLOBIN (HGB A1C): Hemoglobin A1C: 11.8

## 2015-05-22 MED ORDER — FENOFIBRATE 145 MG PO TABS: 145.0000 mg | ORAL_TABLET | Freq: Every day | ORAL | Status: DC

## 2015-05-22 MED ORDER — TRIAMCINOLONE ACETONIDE 0.1 % EX CREA: 1.0000 "application " | TOPICAL_CREAM | Freq: Two times a day (BID) | CUTANEOUS | Status: DC

## 2015-05-22 MED ORDER — LISINOPRIL 5 MG PO TABS: 5.0000 mg | ORAL_TABLET | Freq: Every day | ORAL | Status: DC

## 2015-05-22 MED ORDER — INSULIN ASPART 100 UNIT/ML ~~LOC~~ SOLN: SUBCUTANEOUS | Status: DC

## 2015-05-22 MED ORDER — METFORMIN HCL 500 MG PO TABS
500.0000 mg | ORAL_TABLET | Freq: Two times a day (BID) | ORAL | Status: DC
Start: 2015-05-22 — End: 2015-06-25

## 2015-05-22 MED ORDER — INSULIN GLARGINE 100 UNIT/ML SOLOSTAR PEN: 40.0000 [IU] | PEN_INJECTOR | Freq: Every day | SUBCUTANEOUS | Status: DC

## 2015-05-22 MED ORDER — INSULIN ASPART 100 UNIT/ML ~~LOC~~ SOLN
6.0000 [IU] | Freq: Once | SUBCUTANEOUS | Status: AC
  Administered 2015-05-22: 6 [IU] via SUBCUTANEOUS

## 2015-05-22 NOTE — Progress Notes (Signed)
Subjective:    Patient ID: Brent Costa, male    DOB: [birth year], 24 y.o.   MRN: [id number]  HPI Brent Costa is a 24 year old male patient of Dr Doreene Burke with a medical history of Type 2 DM, HTN who has been unable to afford medications and ran out of meds x79month Today his blood CBG is 315 in the clinic - fasting; he denies abdominal pain, nausea or vomiting. Not compliant with ADA diet or exercise.  Complains of nodular rash on extensor aspect of forearms and right lower back which is pruritic x2 weeks; denies fever or exposure to persons with similar symptoms.  Past Medical History  Diagnosis Date  . Hypertension   . Acid reflux   . Chronic bronchitis     "get it about q year" (11/20/2013)  . Diabetes mellitus without complication     Past Surgical History  Procedure Laterality Date  . Tonsillectomy and adenoidectomy  ~ 1999  . Esophagogastroduodenoscopy N/A 11/21/2013    Procedure: ESOPHAGOGASTRODUODENOSCOPY (EGD);  Surgeon: SWonda Horner MD;  [facility];  Service: Endoscopy;  Laterality: N/A;    Social History   Social History  . Marital Status: Single    Spouse Name: N/A  . Number of Children: N/A  . Years of Education: N/A   Occupational History  . Not on file.   Social History Main Topics  . Smoking status: Former Smoker -- 0.50 packs/day for 3 years    Types: Cigarettes    Quit date: 12/11/2013  . Smokeless tobacco: Not on file  . Alcohol Use: No     Comment: 11/20/2013 "only drink at birthdays, parties, holidays"  . Drug Use: No     Comment: 11/20/2013 "used to use marijuana; used it last < 1 month ago"  . Sexual Activity: Yes   Other Topics Concern  . Not on file   Social History Narrative    No Known Allergies  Current Outpatient Prescriptions on File Prior to Visit  Medication Sig Dispense Refill  . Blood Glucose Monitoring Suppl (ACCU-CHEK AVIVA PLUS) W/DEVICE KIT Check blood sugars 4 times daily (before meals and at bedtime)  (Patient not taking: Reported on 05/22/2015) 1 kit 0  . glucose blood (ACCU-CHEK AVIVA) test strip Use as instructed (Patient not taking: Reported on 05/22/2015) 100 each 12  . hydrOXYzine (ATARAX/VISTARIL) 25 MG tablet Take 1 tablet (25 mg total) by mouth every 8 (eight) hours as needed for itching. (Patient not taking: Reported on 05/22/2015) 15 tablet 0  . Insulin Syringe-Needle U-100 26G X 1/2" 1 ML MISC Dispense as prescribed please (Patient not taking: Reported on 05/22/2015) 100 each 12  . Lancet Devices (ACCU-CHEK SOFTCLIX) lancets Use as instructed (Patient not taking: Reported on 05/22/2015) 1 each 0  . Multiple Vitamins-Minerals (MULTIVITAMIN WITH MINERALS) tablet Take 1 tablet by mouth daily.     No current facility-administered medications on file prior to visit.      Review of Systems  Constitutional: Negative for activity change and appetite change.  HENT: Negative for sinus pressure and sore throat.   Eyes: Negative for visual disturbance.  Respiratory: Negative for cough, chest tightness and shortness of breath.   Cardiovascular: Negative for chest pain and leg swelling.  Gastrointestinal: Negative for abdominal pain, diarrhea, constipation and abdominal distention.  Endocrine: Negative.   Genitourinary: Negative for dysuria.  Musculoskeletal: Negative for myalgias and joint swelling.  Skin:       See hpi  Allergic/Immunologic: Negative.  Neurological: Negative for weakness, light-headedness and numbness.  Psychiatric/Behavioral: Negative for suicidal ideas and dysphoric mood.       Objective: Filed Vitals:   05/22/15 1148  BP: 156/100  Pulse: 91  Temp: 98.3 F (36.8 C)  Height: '5\' 9"'  (1.753 m)  Weight: 278 lb (126.1 kg)  SpO2: 94%      Physical Exam  Constitutional: normal appearing,  Eyes: PERRLA HEENT: Head is atraumatic, normal sinuses, normal oropharynx, normal appearing tonsils and palate, tympanic membrane is normal bilaterally. Neck: normal range of  motion, no thyromegaly, no JVD Cardiovascular: normal rate and rhythm, normal heart sounds, no murmurs, rub or gallop, no pedal edema Respiratory: clear to auscultation bilaterally, no wheezes, no rales, no rhonchi Abdomen: soft, not tender to palpation, normal bowel sounds, no enlarged organs Extremities: Full ROM, no tenderness in joints Skin: nodular rash on extensor aspect of forearms and right lower back, rash is in clusters, no areas of umbilication. Neurological: alert, oriented x3, cranial nerves I-XII grossly intact , normal motor strength, normal sensation. Psychological: normal mood.       Assessment & Plan:  Type 2 diabetes mellitus uncontrolled: Uncontrolled with A1c of 11.8 Poor control secondary to running out of medications. CBG in the clinic is 315 and 6 units of NovoLog administered Urine microalbumin sent off today  Hypertension: Uncontrolled secondary to running out of medications. Medications refilled.  Rash: Unknown etiology. Advised to take triamcinolone and continue hydroxyzine and I will reassess at next office visit.

## 2015-05-22 NOTE — Patient Instructions (Signed)
Diabetes Mellitus and Food It is important for you to manage your blood sugar (glucose) level. Your blood glucose level can be greatly affected by what you eat. Eating healthier foods in the appropriate amounts throughout the day at about the same time each day will help you control your blood glucose level. It can also help slow or prevent worsening of your diabetes mellitus. Healthy eating may even help you improve the level of your blood pressure and reach or maintain a healthy weight.  HOW CAN FOOD AFFECT ME? Carbohydrates Carbohydrates affect your blood glucose level more than any other type of food. Your dietitian will help you determine how many carbohydrates to eat at each meal and teach you how to count carbohydrates. Counting carbohydrates is important to keep your blood glucose at a healthy level, especially if you are using insulin or taking certain medicines for diabetes mellitus. Alcohol Alcohol can cause sudden decreases in blood glucose (hypoglycemia), especially if you use insulin or take certain medicines for diabetes mellitus. Hypoglycemia can be a life-threatening condition. Symptoms of hypoglycemia (sleepiness, dizziness, and disorientation) are similar to symptoms of having too much alcohol.  If your health care provider has given you approval to drink alcohol, do so in moderation and use the following guidelines:  Women should not have more than one drink per day, and men should not have more than two drinks per day. One drink is equal to:  12 oz of beer.  5 oz of wine.  1 oz of hard liquor.  Do not drink on an empty stomach.  Keep yourself hydrated. Have water, diet soda, or unsweetened iced tea.  Regular soda, juice, and other mixers might contain a lot of carbohydrates and should be counted. WHAT FOODS ARE NOT RECOMMENDED? As you make food choices, it is important to remember that all foods are not the same. Some foods have fewer nutrients per serving than other  foods, even though they might have the same number of calories or carbohydrates. It is difficult to get your body what it needs when you eat foods with fewer nutrients. Examples of foods that you should avoid that are high in calories and carbohydrates but low in nutrients include:  Trans fats (most processed foods list trans fats on the Nutrition Facts label).  Regular soda.  Juice.  Candy.  Sweets, such as cake, pie, doughnuts, and cookies.  Fried foods. WHAT FOODS CAN I EAT? Have nutrient-rich foods, which will nourish your body and keep you healthy. The food you should eat also will depend on several factors, including:  The calories you need.  The medicines you take.  Your weight.  Your blood glucose level.  Your blood pressure level.  Your cholesterol level. You also should eat a variety of foods, including:  Protein, such as meat, poultry, fish, tofu, nuts, and seeds (lean animal proteins are best).  Fruits.  Vegetables.  Dairy products, such as milk, cheese, and yogurt (low fat is best).  Breads, grains, pasta, cereal, rice, and beans.  Fats such as olive oil, trans fat-free margarine, canola oil, avocado, and olives. DOES EVERYONE WITH DIABETES MELLITUS HAVE THE SAME MEAL PLAN? Because every person with diabetes mellitus is different, there is not one meal plan that works for everyone. It is very important that you meet with a dietitian who will help you create a meal plan that is just right for you. Document Released: 06/18/2005 Document Revised: 09/26/2013 Document Reviewed: 08/18/2013 ExitCare Patient Information 2015 [employer]. This   information is not intended to replace advice given to you by your health care provider. Make sure you discuss any questions you have with your health care provider.  

## 2015-05-22 NOTE — Progress Notes (Signed)
Patient of Dr. Hyman Hopes Here because he has been out of all medications for 1 month

## 2015-05-23 ENCOUNTER — Telehealth: Payer: Self-pay | Admitting: *Deleted

## 2015-05-23 DIAGNOSIS — I10 Essential (primary) hypertension: Secondary | ICD-10-CM | POA: Insufficient documentation

## 2015-05-23 LAB — MICROALBUMIN / CREATININE URINE RATIO
Creatinine, Urine: 52.9 mg/dL
Microalb Creat Ratio: 56.7 mg/g — ABNORMAL HIGH (ref 0.0–30.0)
Microalb, Ur: 3 mg/dL — ABNORMAL HIGH (ref <2.0)

## 2015-05-23 NOTE — Telephone Encounter (Signed)
Verified name and date of birth and gave patient results that he has microalbuminuria and explained to him what that meant.  Patient verbalized understanding.

## 2015-05-23 NOTE — Telephone Encounter (Signed)
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

## 2015-06-06 DIAGNOSIS — B081 Molluscum contagiosum: Secondary | ICD-10-CM

## 2015-06-06 HISTORY — DX: Molluscum contagiosum: B08.1

## 2015-06-12 ENCOUNTER — Ambulatory Visit: Payer: Self-pay | Admitting: Family Medicine

## 2015-06-18 ENCOUNTER — Telehealth: Payer: Self-pay | Admitting: Clinical

## 2015-06-18 NOTE — Telephone Encounter (Signed)
Left HIPPA-compliant message to return call to Jamie at [facility] at [phone].  

## 2015-06-25 ENCOUNTER — Encounter: Payer: Self-pay | Admitting: Family Medicine

## 2015-06-25 ENCOUNTER — Ambulatory Visit: Payer: No Typology Code available for payment source | Attending: Family Medicine | Admitting: Family Medicine

## 2015-06-25 VITALS — BP 161/79 | HR 107 | Temp 98.4°F | Ht 69.0 in | Wt 272.0 lb

## 2015-06-25 DIAGNOSIS — E1121 Type 2 diabetes mellitus with diabetic nephropathy: Secondary | ICD-10-CM

## 2015-06-25 DIAGNOSIS — I10 Essential (primary) hypertension: Secondary | ICD-10-CM

## 2015-06-25 DIAGNOSIS — Z79899 Other long term (current) drug therapy: Secondary | ICD-10-CM | POA: Insufficient documentation

## 2015-06-25 DIAGNOSIS — Z794 Long term (current) use of insulin: Secondary | ICD-10-CM | POA: Insufficient documentation

## 2015-06-25 DIAGNOSIS — R739 Hyperglycemia, unspecified: Secondary | ICD-10-CM

## 2015-06-25 DIAGNOSIS — E781 Pure hyperglyceridemia: Secondary | ICD-10-CM | POA: Insufficient documentation

## 2015-06-25 DIAGNOSIS — B081 Molluscum contagiosum: Secondary | ICD-10-CM | POA: Insufficient documentation

## 2015-06-25 DIAGNOSIS — E118 Type 2 diabetes mellitus with unspecified complications: Secondary | ICD-10-CM

## 2015-06-25 DIAGNOSIS — Z87891 Personal history of nicotine dependence: Secondary | ICD-10-CM | POA: Insufficient documentation

## 2015-06-25 DIAGNOSIS — E1165 Type 2 diabetes mellitus with hyperglycemia: Secondary | ICD-10-CM | POA: Insufficient documentation

## 2015-06-25 DIAGNOSIS — K219 Gastro-esophageal reflux disease without esophagitis: Secondary | ICD-10-CM | POA: Insufficient documentation

## 2015-06-25 LAB — POCT URINALYSIS DIPSTICK
BILIRUBIN UA: NEGATIVE
GLUCOSE UA: 500
KETONES UA: NEGATIVE
LEUKOCYTES UA: NEGATIVE
NITRITE UA: NEGATIVE
Protein, UA: NEGATIVE
Spec Grav, UA: 1.01
Urobilinogen, UA: 0.2
pH, UA: 6

## 2015-06-25 LAB — GLUCOSE, POCT (MANUAL RESULT ENTRY)
POC Glucose: 502 mg/dl — AB (ref 70–99)
POC Glucose: 431 mg/dl — AB (ref 70–99)

## 2015-06-25 MED ORDER — PODOFILOX 0.5 % EX SOLN: Freq: Two times a day (BID) | CUTANEOUS | Status: DC

## 2015-06-25 MED ORDER — INSULIN ASPART 100 UNIT/ML ~~LOC~~ SOLN
10.0000 [IU] | Freq: Once | SUBCUTANEOUS | Status: AC
  Administered 2015-06-25: 10 [IU] via SUBCUTANEOUS

## 2015-06-25 MED ORDER — INSULIN ASPART 100 UNIT/ML ~~LOC~~ SOLN: 10.0000 [IU] | Freq: Once | SUBCUTANEOUS | Status: DC

## 2015-06-25 MED ORDER — INSULIN GLARGINE 100 UNIT/ML SOLOSTAR PEN: 45.0000 [IU] | PEN_INJECTOR | Freq: Every day | SUBCUTANEOUS | Status: DC

## 2015-06-25 MED ORDER — METFORMIN HCL 500 MG PO TABS: 1000.0000 mg | ORAL_TABLET | Freq: Two times a day (BID) | ORAL | Status: DC

## 2015-06-25 MED ORDER — LISINOPRIL 10 MG PO TABS: 10.0000 mg | ORAL_TABLET | Freq: Every day | ORAL | Status: DC

## 2015-06-25 NOTE — Patient Instructions (Signed)
Molluscum Contagiosum Molluscum contagiosum is a viral infection of the skin that causes smooth surfaced, firm, small (3 to 5 mm), dome-shaped bumps (papules) which are flesh-colored. The bumps usually do not hurt or itch. In children, they most often appear on the face, trunk, arms and legs. In adults, the growths are commonly found on the genitals, thighs, face, neck, and belly (abdomen). The infection may be spread to others by close (skin to skin) contact (such as occurs in schools and swimming pools), sharing towels and clothing, and through sexual contact. The bumps usually disappear without treatment in 2 to 4 months, especially in children. You may have them treated to avoid spreading them. Scraping (curetting) the middle part (central plug) of the bump with a needle or sharp curette, or application of liquid nitrogen for 8 or 9 seconds usually cures the infection. HOME CARE INSTRUCTIONS   Do not scratch the bumps. This may spread the infection to other parts of the body and to other people.  Avoid close contact with others, including sexual contact, until the bumps disappear. Do not share towels or clothing.  If liquid nitrogen was used, blisters will form. Leave the blisters alone and cover with a bandage. The tops will fall off by themselves in 7 to 14 days.  Four months without a lesion is usually a cure. SEEK IMMEDIATE MEDICAL CARE IF:  You have a fever.  You develop swelling, redness, pain, tenderness, or warmth in the areas of the bumps. They may be infected. Document Released: 09/18/2000 Document Revised: 12/14/2011 Document Reviewed: 03/01/2009 ExitCare Patient Information 2015 [employer]. This information is not intended to replace advice given to you by your health care Basilia Stuckert. Make sure you discuss any questions you have with your health care Jonika Critz.  

## 2015-06-25 NOTE — Progress Notes (Signed)
Subjective:    Patient ID: Brent Costa, male    DOB: 07/11/1991, 24 y.o.   MRN: [id number]  HPI  Brent Costa is a 24 year old male with a history of uncontrolled type 2 diabetes mellitus with microalbuminuria, hypertension, hypertriglyceridemia who comes in here for follow-up on an upper extremity rash for which she was seen at his last office visit. He received triamcinolone cream which is not helping and he thinks rash is spreading from his forearms to his arms. At his last office visit he was hyperglycemic and blood pressure was elevated but he attributed this to running out of medications and so no changes to his regimen were made. Today his blood sugar is 502 in the clinic and he admits to forgetting to take his medications occasionally.  Past Medical History  Diagnosis Date  . Hypertension   . Acid reflux   . Chronic bronchitis     "get it about q year" (11/20/2013)  . Diabetes mellitus without complication     Past Surgical History  Procedure Laterality Date  . Tonsillectomy and adenoidectomy  ~ 1999  . Esophagogastroduodenoscopy N/A 11/21/2013    Procedure: ESOPHAGOGASTRODUODENOSCOPY (EGD);  Surgeon: Wonda Horner, MD;  Location: [facility] ENDOSCOPY;  Service: Endoscopy;  Laterality: N/A;    Social History   Social History  . Marital Status: Single    Spouse Name: N/A  . Number of Children: N/A  . Years of Education: N/A   Occupational History  . Not on file.   Social History Main Topics  . Smoking status: Former Smoker -- 0.50 packs/day for 3 years    Types: Cigarettes    Quit date: 12/11/2013  . Smokeless tobacco: Not on file  . Alcohol Use: No     Comment: 11/20/2013 "only drink at birthdays, parties, holidays"  . Drug Use: No     Comment: 11/20/2013 "used to use marijuana; used it last < 1 month ago"  . Sexual Activity: Yes   Other Topics Concern  . Not on file   Social History Narrative    No Known Allergies  Current Outpatient Prescriptions on  File Prior to Visit  Medication Sig Dispense Refill  . Blood Glucose Monitoring Suppl (ACCU-CHEK AVIVA PLUS) W/DEVICE KIT Check blood sugars 4 times daily (before meals and at bedtime) 1 kit 0  . fenofibrate (TRICOR) 145 MG tablet Take 1 tablet (145 mg total) by mouth daily. 60 tablet 1  . glucose blood (ACCU-CHEK AVIVA) test strip Use as instructed 100 each 12  . hydrOXYzine (ATARAX/VISTARIL) 25 MG tablet Take 1 tablet (25 mg total) by mouth every 8 (eight) hours as needed for itching. 15 tablet 0  . insulin aspart (NOVOLOG) 100 UNIT/ML injection Blood Glucose 150 - 200, give 3 units Blood Glucose 201 - 250, give 5 units Blood Glucose 251 - 300, give 7 units Blood Glucose 301 - 350, give 10 units 4 vial 3  . Insulin Syringe-Needle U-100 26G X 1/2" 1 ML MISC Dispense as prescribed please 100 each 12  . Lancet Devices (ACCU-CHEK SOFTCLIX) lancets Use as instructed 1 each 0  . Multiple Vitamins-Minerals (MULTIVITAMIN WITH MINERALS) tablet Take 1 tablet by mouth daily.    Marland Kitchen triamcinolone cream (KENALOG) 0.1 % Apply 1 application topically 2 (two) times daily. 45 g 1   No current facility-administered medications on file prior to visit.      Review of Systems Constitutional: Negative for activity change and appetite change.  HENT: Negative for sinus pressure and  sore throat.   Eyes: Negative for visual disturbance.  Respiratory: Negative for cough, chest tightness and shortness of breath.   Cardiovascular: Negative for chest pain and leg swelling.  Gastrointestinal: Negative for abdominal pain, diarrhea, constipation and abdominal distention.  Endocrine: Negative.   Genitourinary: Negative for dysuria.  Musculoskeletal: Negative for myalgias and joint swelling.  Skin:       See hpi  Allergic/Immunologic: Negative.   Neurological: Negative for weakness, light-headedness and numbness.  Psychiatric/Behavioral: Negative for suicidal ideas and dysphoric mood.      Objective: Filed  Vitals:   06/25/15 1616  BP: 161/79  Pulse: 107  Temp: 98.4 F (36.9 C)  Height: '5\' 9"'  (1.753 m)  Weight: 272 lb (123.378 kg)  SpO2: 98%      Physical Exam Constitutional: normal appearing,  Neck: normal range of motion, no thyromegaly, no JVD Cardiovascular: normal rate and rhythm, normal heart sounds, no murmurs, rub or gallop, no pedal edema Respiratory: clear to auscultation bilaterally, no wheezes, no rales, no rhonchi Abdomen: soft, not tender to palpation, normal bowel sounds, no enlarged organs Extremities: Full ROM, no tenderness in joints Skin: nodular rash on extensor aspect of forearms and right lower back, rash is in clusters, some areas of umbilication. Neurological: alert, oriented x3, cranial nerves I-XII grossly intact , normal motor strength, normal sensation. Psychological: normal mood.       Assessment & Plan:  Type 2 diabetes mellitus uncontrolled: Uncontrolled with A1c of 11.8, CBG 502 UA negative for ketones. NovoLog 10 units given in the clinic and repeat CBG performed after 40 minutes. Increased her metformin from 500 mg to 1000 mg twice daily and Lantus increased from 40 units to 45 units daily Scheduled to see clinical pharmacist at his next visit to reinforce compliance with ADA diet and review his blood sugar log.  Hypertension: Uncontrolled Lisinopril raised from 5 mg to 10 mg.  Hypertriglyceridemia: Continue fenofibrate.  Molluscum contagiosum: Placed on Podophylline Discussed contact precautions and advised to avoid sharing clothing. Unknown etiology.

## 2015-06-25 NOTE — Progress Notes (Signed)
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

## 2015-06-26 LAB — BASIC METABOLIC PANEL
BUN: 9 mg/dL (ref 7–25)
CALCIUM: 9.8 mg/dL (ref 8.6–10.3)
CO2: 22 mmol/L (ref 20–31)
CREATININE: 0.95 mg/dL (ref 0.60–1.35)
Chloride: 96 mmol/L — ABNORMAL LOW (ref 98–110)
GLUCOSE: 421 mg/dL — AB (ref 65–99)
Potassium: 4.5 mmol/L (ref 3.5–5.3)
SODIUM: 131 mmol/L — AB (ref 135–146)

## 2015-10-02 ENCOUNTER — Telehealth: Payer: Self-pay | Admitting: Internal Medicine

## 2015-10-02 NOTE — Telephone Encounter (Signed)
Pt is breaking out in a rash and large bumps. Pt's mother would like a call from the nurse with advice I advised her to take the Pt to Urgent Care

## 2015-10-06 DIAGNOSIS — K861 Other chronic pancreatitis: Secondary | ICD-10-CM

## 2015-10-06 HISTORY — DX: Other chronic pancreatitis: K86.1

## 2015-10-16 ENCOUNTER — Inpatient Hospital Stay (HOSPITAL_COMMUNITY)
Admission: EM | Admit: 2015-10-16 | Discharge: 2015-10-26 | DRG: 438 | Disposition: A | Discharge status: Home / Self Care | Payer: Self-pay | Attending: Internal Medicine | Admitting: Internal Medicine

## 2015-10-16 ENCOUNTER — Encounter (HOSPITAL_COMMUNITY): Payer: Self-pay | Admitting: Emergency Medicine

## 2015-10-16 ENCOUNTER — Emergency Department (HOSPITAL_COMMUNITY): Payer: Self-pay

## 2015-10-16 ENCOUNTER — Inpatient Hospital Stay (HOSPITAL_COMMUNITY): Payer: Self-pay

## 2015-10-16 ENCOUNTER — Encounter (HOSPITAL_COMMUNITY): Payer: Self-pay | Admitting: *Deleted

## 2015-10-16 ENCOUNTER — Emergency Department (HOSPITAL_COMMUNITY)
Admission: EM | Admit: 2015-10-16 | Discharge: 2015-10-16 | Discharge status: Against Medical Advice | Payer: Self-pay | Attending: Emergency Medicine | Admitting: Emergency Medicine

## 2015-10-16 DIAGNOSIS — K219 Gastro-esophageal reflux disease without esophagitis: Secondary | ICD-10-CM | POA: Diagnosis present

## 2015-10-16 DIAGNOSIS — I10 Essential (primary) hypertension: Secondary | ICD-10-CM | POA: Diagnosis present

## 2015-10-16 DIAGNOSIS — R6510 Systemic inflammatory response syndrome (SIRS) of non-infectious origin without acute organ dysfunction: Secondary | ICD-10-CM | POA: Diagnosis present

## 2015-10-16 DIAGNOSIS — E1165 Type 2 diabetes mellitus with hyperglycemia: Secondary | ICD-10-CM

## 2015-10-16 DIAGNOSIS — R739 Hyperglycemia, unspecified: Secondary | ICD-10-CM | POA: Diagnosis present

## 2015-10-16 DIAGNOSIS — K859 Acute pancreatitis without necrosis or infection, unspecified: Principal | ICD-10-CM | POA: Insufficient documentation

## 2015-10-16 DIAGNOSIS — K2900 Acute gastritis without bleeding: Secondary | ICD-10-CM | POA: Diagnosis present

## 2015-10-16 DIAGNOSIS — E109 Type 1 diabetes mellitus without complications: Secondary | ICD-10-CM | POA: Insufficient documentation

## 2015-10-16 DIAGNOSIS — E1140 Type 2 diabetes mellitus with diabetic neuropathy, unspecified: Secondary | ICD-10-CM

## 2015-10-16 DIAGNOSIS — R079 Chest pain, unspecified: Secondary | ICD-10-CM | POA: Diagnosis present

## 2015-10-16 DIAGNOSIS — K858 Other acute pancreatitis without necrosis or infection: Secondary | ICD-10-CM | POA: Diagnosis present

## 2015-10-16 DIAGNOSIS — R1110 Vomiting, unspecified: Secondary | ICD-10-CM | POA: Insufficient documentation

## 2015-10-16 DIAGNOSIS — E1010 Type 1 diabetes mellitus with ketoacidosis without coma: Secondary | ICD-10-CM | POA: Diagnosis present

## 2015-10-16 DIAGNOSIS — R1033 Periumbilical pain: Secondary | ICD-10-CM

## 2015-10-16 DIAGNOSIS — E1310 Other specified diabetes mellitus with ketoacidosis without coma: Secondary | ICD-10-CM

## 2015-10-16 DIAGNOSIS — R112 Nausea with vomiting, unspecified: Secondary | ICD-10-CM

## 2015-10-16 DIAGNOSIS — Z9119 Patient's noncompliance with other medical treatment and regimen: Secondary | ICD-10-CM

## 2015-10-16 DIAGNOSIS — B081 Molluscum contagiosum: Secondary | ICD-10-CM | POA: Diagnosis present

## 2015-10-16 DIAGNOSIS — R109 Unspecified abdominal pain: Secondary | ICD-10-CM

## 2015-10-16 DIAGNOSIS — E118 Type 2 diabetes mellitus with unspecified complications: Secondary | ICD-10-CM

## 2015-10-16 DIAGNOSIS — R509 Fever, unspecified: Secondary | ICD-10-CM

## 2015-10-16 DIAGNOSIS — K2980 Duodenitis without bleeding: Secondary | ICD-10-CM | POA: Diagnosis present

## 2015-10-16 DIAGNOSIS — Z6839 Body mass index (BMI) 39.0-39.9, adult: Secondary | ICD-10-CM

## 2015-10-16 DIAGNOSIS — E875 Hyperkalemia: Secondary | ICD-10-CM | POA: Diagnosis present

## 2015-10-16 DIAGNOSIS — E781 Pure hyperglyceridemia: Secondary | ICD-10-CM | POA: Diagnosis present

## 2015-10-16 DIAGNOSIS — Z794 Long term (current) use of insulin: Secondary | ICD-10-CM

## 2015-10-16 DIAGNOSIS — R1013 Epigastric pain: Secondary | ICD-10-CM | POA: Diagnosis present

## 2015-10-16 DIAGNOSIS — K760 Fatty (change of) liver, not elsewhere classified: Secondary | ICD-10-CM | POA: Diagnosis present

## 2015-10-16 DIAGNOSIS — E1110 Type 2 diabetes mellitus with ketoacidosis without coma: Secondary | ICD-10-CM | POA: Diagnosis present

## 2015-10-16 DIAGNOSIS — Z9114 Patient's other noncompliance with medication regimen: Secondary | ICD-10-CM

## 2015-10-16 DIAGNOSIS — IMO0001 Reserved for inherently not codable concepts without codable children: Secondary | ICD-10-CM

## 2015-10-16 DIAGNOSIS — Z8249 Family history of ischemic heart disease and other diseases of the circulatory system: Secondary | ICD-10-CM

## 2015-10-16 DIAGNOSIS — Z833 Family history of diabetes mellitus: Secondary | ICD-10-CM

## 2015-10-16 HISTORY — DX: Molluscum contagiosum: B08.1

## 2015-10-16 HISTORY — DX: Fatty (change of) liver, not elsewhere classified: K76.0

## 2015-10-16 HISTORY — DX: Proteinuria, unspecified: R80.9

## 2015-10-16 HISTORY — DX: Pure hyperglyceridemia: E78.1

## 2015-10-16 LAB — COMPREHENSIVE METABOLIC PANEL
ALK PHOS: 68 U/L (ref 38–126)
ALT: 27 U/L (ref 17–63)
AST: 21 U/L (ref 15–41)
Albumin: 4.9 g/dL (ref 3.5–5.0)
Anion gap: 18 — ABNORMAL HIGH (ref 5–15)
BUN: 12 mg/dL (ref 6–20)
CALCIUM: 10.2 mg/dL (ref 8.9–10.3)
CHLORIDE: 96 mmol/L — AB (ref 101–111)
CO2: 22 mmol/L (ref 22–32)
CREATININE: 0.96 mg/dL (ref 0.61–1.24)
GFR calc non Af Amer: >60 mL/min (ref >60)
GLUCOSE: 394 mg/dL — AB (ref 65–99)
Potassium: 4.7 mmol/L (ref 3.5–5.1)
SODIUM: 136 mmol/L (ref 135–145)
Total Bilirubin: 1.1 mg/dL (ref 0.3–1.2)
Total Protein: 8.1 g/dL (ref 6.5–8.1)

## 2015-10-16 LAB — CBC
HCT: 41 % (ref 39.0–52.0)
Hemoglobin: 14.9 g/dL (ref 13.0–17.0)
MCH: 28.8 pg (ref 26.0–34.0)
MCHC: 36.3 g/dL — ABNORMAL HIGH (ref 30.0–36.0)
MCV: 79.2 fL (ref 78.0–100.0)
PLATELETS: 242 10*3/uL (ref 150–400)
RBC: 5.18 MIL/uL (ref 4.22–5.81)
RDW: 13.5 % (ref 11.5–15.5)
WBC: 8 10*3/uL (ref 4.0–10.5)

## 2015-10-16 LAB — I-STAT CG4 LACTIC ACID, ED: LACTIC ACID, VENOUS: 1.37 mmol/L (ref 0.5–2.0)

## 2015-10-16 LAB — URINALYSIS, ROUTINE W REFLEX MICROSCOPIC
BILIRUBIN URINE: NEGATIVE
KETONES UR: 40 mg/dL — AB
Leukocytes, UA: NEGATIVE
Nitrite: NEGATIVE
PH: 6 (ref 5.0–8.0)
Protein, ur: 100 mg/dL — AB
SPECIFIC GRAVITY, URINE: 1.044 — AB (ref 1.005–1.030)

## 2015-10-16 LAB — URINE MICROSCOPIC-ADD ON: BACTERIA UA: NONE SEEN

## 2015-10-16 LAB — CBG MONITORING, ED
GLUCOSE-CAPILLARY: 419 mg/dL — AB (ref 65–99)
GLUCOSE-CAPILLARY: 387 mg/dL — AB (ref 65–99)
Glucose-Capillary: 351 mg/dL — ABNORMAL HIGH (ref 65–99)
Glucose-Capillary: 360 mg/dL — ABNORMAL HIGH (ref 65–99)

## 2015-10-16 LAB — LIPASE, BLOOD: Lipase: 595 U/L — ABNORMAL HIGH (ref 11–51)

## 2015-10-16 MED ORDER — SODIUM CHLORIDE 0.9 % IV SOLN
INTRAVENOUS | Status: DC
  Administered 2015-10-17 – 2015-10-18 (×6): via INTRAVENOUS

## 2015-10-16 MED ORDER — SODIUM CHLORIDE 0.9 % IV SOLN
INTRAVENOUS | Status: DC
  Administered 2015-10-17: 3 [IU]/h via INTRAVENOUS
  Filled 2015-10-16: qty 2.5

## 2015-10-16 MED ORDER — LISINOPRIL 10 MG PO TABS
10.0000 mg | ORAL_TABLET | Freq: Every day | ORAL | Status: DC
  Administered 2015-10-18: 10 mg via ORAL
  Filled 2015-10-16 (×2): qty 1

## 2015-10-16 MED ORDER — ONDANSETRON 4 MG PO TBDP
4.0000 mg | ORAL_TABLET | Freq: Once | ORAL | Status: AC | PRN
  Administered 2015-10-16: 4 mg via ORAL
  Filled 2015-10-16: qty 1

## 2015-10-16 MED ORDER — DEXTROSE-NACL 5-0.45 % IV SOLN
INTRAVENOUS | Status: DC
  Administered 2015-10-17: 10:00:00 via INTRAVENOUS

## 2015-10-16 MED ORDER — MORPHINE SULFATE (PF) 2 MG/ML IV SOLN
2.0000 mg | INTRAVENOUS | Status: DC | PRN
  Administered 2015-10-17 (×2): 2 mg via INTRAVENOUS
  Filled 2015-10-16 (×2): qty 1

## 2015-10-16 MED ORDER — SODIUM CHLORIDE 0.9 % IV SOLN: 1000.0000 mL | INTRAVENOUS | Status: DC

## 2015-10-16 MED ORDER — SODIUM CHLORIDE 0.9 % IV BOLUS (SEPSIS)
1000.0000 mL | Freq: Once | INTRAVENOUS | Status: AC
  Administered 2015-10-16: 1000 mL via INTRAVENOUS

## 2015-10-16 MED ORDER — MORPHINE SULFATE (PF) 4 MG/ML IV SOLN
4.0000 mg | Freq: Once | INTRAVENOUS | Status: DC
  Filled 2015-10-16: qty 1

## 2015-10-16 MED ORDER — TRIAMCINOLONE ACETONIDE 0.1 % EX CREA
1.0000 "application " | TOPICAL_CREAM | Freq: Two times a day (BID) | CUTANEOUS | Status: DC
  Administered 2015-10-17 – 2015-10-26 (×15): 1 via TOPICAL
  Filled 2015-10-16: qty 15

## 2015-10-16 MED ORDER — IOHEXOL 300 MG/ML  SOLN
25.0000 mL | Freq: Once | INTRAMUSCULAR | Status: AC | PRN
  Administered 2015-10-16: 25 mL via ORAL

## 2015-10-16 MED ORDER — ENOXAPARIN SODIUM 40 MG/0.4ML ~~LOC~~ SOLN: 40.0000 mg | SUBCUTANEOUS | Status: DC

## 2015-10-16 MED ORDER — MORPHINE SULFATE (PF) 4 MG/ML IV SOLN
4.0000 mg | Freq: Once | INTRAVENOUS | Status: AC
  Administered 2015-10-16: 4 mg via INTRAVENOUS
  Filled 2015-10-16: qty 1

## 2015-10-16 MED ORDER — DEXTROSE-NACL 5-0.45 % IV SOLN: INTRAVENOUS | Status: DC

## 2015-10-16 MED ORDER — HYDROXYZINE HCL 25 MG PO TABS
25.0000 mg | ORAL_TABLET | Freq: Three times a day (TID) | ORAL | Status: DC | PRN
  Administered 2015-10-17 – 2015-10-22 (×2): 25 mg via ORAL
  Filled 2015-10-16 (×3): qty 1

## 2015-10-16 MED ORDER — POTASSIUM CHLORIDE 10 MEQ/100ML IV SOLN
10.0000 meq | INTRAVENOUS | Status: AC
  Filled 2015-10-16: qty 100

## 2015-10-16 MED ORDER — ONDANSETRON HCL 4 MG/2ML IJ SOLN
4.0000 mg | INTRAMUSCULAR | Status: AC
  Administered 2015-10-16: 4 mg via INTRAVENOUS
  Filled 2015-10-16: qty 2

## 2015-10-16 MED ORDER — PROMETHAZINE HCL 25 MG/ML IJ SOLN
25.0000 mg | Freq: Once | INTRAMUSCULAR | Status: DC
  Filled 2015-10-16: qty 1

## 2015-10-16 MED ORDER — ONDANSETRON HCL 4 MG/2ML IJ SOLN
4.0000 mg | Freq: Three times a day (TID) | INTRAMUSCULAR | Status: DC | PRN
  Administered 2015-10-17: 4 mg via INTRAVENOUS
  Filled 2015-10-16: qty 2

## 2015-10-16 MED ORDER — IOHEXOL 300 MG/ML  SOLN
100.0000 mL | Freq: Once | INTRAMUSCULAR | Status: AC | PRN
  Administered 2015-10-16: 100 mL via INTRAVENOUS

## 2015-10-16 MED ORDER — SODIUM CHLORIDE 0.9 % IV SOLN
INTRAVENOUS | Status: DC
  Administered 2015-10-17: 13.1 [IU]/h via INTRAVENOUS
  Administered 2015-10-17: 3 [IU]/h via INTRAVENOUS
  Administered 2015-10-17: 14.1 [IU]/h via INTRAVENOUS
  Filled 2015-10-16: qty 2.5

## 2015-10-16 MED ORDER — HYDROMORPHONE HCL 1 MG/ML IJ SOLN
1.0000 mg | Freq: Once | INTRAMUSCULAR | Status: AC
  Administered 2015-10-16: 1 mg via INTRAVENOUS
  Filled 2015-10-16: qty 1

## 2015-10-16 MED ORDER — ONDANSETRON 4 MG PO TBDP
4.0000 mg | ORAL_TABLET | Freq: Three times a day (TID) | ORAL | Status: AC | PRN
  Administered 2015-10-17: 4 mg via ORAL
  Filled 2015-10-16: qty 1

## 2015-10-16 MED ORDER — PANTOPRAZOLE SODIUM 40 MG IV SOLR
40.0000 mg | INTRAVENOUS | Status: DC
  Administered 2015-10-17: 40 mg via INTRAVENOUS
  Filled 2015-10-16: qty 40

## 2015-10-16 MED ORDER — SODIUM CHLORIDE 0.9 % IV SOLN
1000.0000 mL | Freq: Once | INTRAVENOUS | Status: AC
  Administered 2015-10-17: 1000 mL via INTRAVENOUS

## 2015-10-16 NOTE — ED Notes (Signed)
Pt reports waking up this am with severe mid abd pain and n/v. Denies diarrhea.

## 2015-10-16 NOTE — H&P (Addendum)
[facility] History and Physical  ELMUS MATHES QBV:[id number] DOB: [birth year] DOA: 10/16/2015  Referring physician: ED physician PCP: Angelica Chessman, MD  Specialists:   Chief Complaint: Nausea, vomiting, abdominal pain  HPI: ANDRIK SANDT is a 25 y.o. male with PMH of diabetes mellitus, hypertension, hypertriglyceridemia, GERD, bronchitis, molluscum contagiosum, who presents with nausea, vomiting and abdominal pain.  Patient reports that he has been having nausea, vomiting and abdominal pain since this morning. He vomited more than 10 times without blood in the vomitus. He does not have fever, chills and diarrhea. His abdominal pain is located in the periumbilical area, constant, 10 out of 10 in severity, nonradiating. He has mild chest pain over right upper chest, but no cough, shortness of breath. No tenderness over calf areas. Patient reports that he usually does not drink alcohol, but has been drinking 2 beers each day in the past 3 days. Patient does not have symptoms of UTI, unilateral weakness.  In ED, patient was found to have elevated lipase 595, lactate 1.37, elevated AG=18, bicarbonate of 22, urinalysis negative for UTI, but positive for ketone, tachycardia, tachypnea, leukocytosis with WBC 10.6, renal function and electrolytes okay. CT abdomen/pelvis that showed possible pancreatitis vs. duodenitis. Patient is admitted to inpatient for further interventional treatment.  EKG: Not done in ED, will get one.   Where does patient live?   At home    Can patient participate in ADLs?  Yes   Review of Systems:   General: no fevers, chills, no changes in body weight, has poor appetite, has fatigue HEENT: no blurry vision, hearing changes or sore throat Pulm: no dyspnea, coughing, wheezing CV: has mild chest pain, no palpitations Abd: has nausea, vomiting, abdominal pain, no diarrhea, constipation GU: no dysuria, burning on urination, increased urinary frequency,  hematuria  Ext: no leg edema Neuro: no unilateral weakness, numbness, or tingling, no vision change or hearing loss Skin: has rashes. MSK: No muscle spasm, no deformity, no limitation of range of movement in spin Heme: No easy bruising.  Travel history: No recent long distant travel.  Allergy: No Known Allergies  Past Medical History  Diagnosis Date  . Hypertension   . Acid reflux   . Chronic bronchitis ([place])     "get it about q year" (11/20/2013)  . Diabetes mellitus without complication [facility])     Past Surgical History  Procedure Laterality Date  . Tonsillectomy and adenoidectomy  ~ 1999  . Esophagogastroduodenoscopy N/A 11/21/2013    Procedure: ESOPHAGOGASTRODUODENOSCOPY (EGD);  Surgeon: Wonda Horner, MD;  Location: [facility] ENDOSCOPY;  Service: Endoscopy;  Laterality: N/A;    Social History:  reports that he quit smoking about 22 months ago. His smoking use included Cigarettes. He has a 1.5 pack-year smoking history. He does not have any smokeless tobacco history on file. He reports that he does not drink alcohol or use illicit drugs.  Family History:  Family History  Problem Relation Age of Onset  . Diabetes Mellitus II Father   . Heart failure Father   . Asthma Mother      Prior to Admission medications   Medication Sig Start Date End Date Taking? Authorizing Provider  insulin aspart (NOVOLOG) 100 UNIT/ML injection Blood Glucose 150 - 200, give 3 units Blood Glucose 201 - 250, give 5 units Blood Glucose 251 - 300, give 7 units Blood Glucose 301 - 350, give 10 units Patient taking differently: Inject 3-10 Units into the skin 3 (three) times daily. Blood Glucose 150 -  200, give 3 units Blood Glucose 201 - 250, give 5 units Blood Glucose 251 - 300, give 7 units Blood Glucose 301 - 350, give 10 units 05/22/15  Yes Arnoldo Morale, MD  Insulin Glargine (LANTUS) 100 UNIT/ML Solostar Pen Inject 45 Units into the skin daily at 10 pm. 06/25/15  Yes Arnoldo Morale, MD  lisinopril  (PRINIVIL,ZESTRIL) 10 MG tablet Take 1 tablet (10 mg total) by mouth daily. 06/25/15  Yes Arnoldo Morale, MD  metFORMIN (GLUCOPHAGE) 500 MG tablet Take 2 tablets (1,000 mg total) by mouth 2 (two) times daily with a meal. 06/25/15  Yes Arnoldo Morale, MD  Blood Glucose Monitoring Suppl (ACCU-CHEK AVIVA PLUS) W/DEVICE KIT Check blood sugars 4 times daily (before meals and at bedtime) 12/20/14   Tresa Garter, MD  fenofibrate (TRICOR) 145 MG tablet Take 1 tablet (145 mg total) by mouth daily. Patient not taking: Reported on 10/16/2015 05/22/15   Arnoldo Morale, MD  glucose blood (ACCU-CHEK AVIVA) test strip Use as instructed 12/20/14   Tresa Garter, MD  hydrOXYzine (ATARAX/VISTARIL) 25 MG tablet Take 1 tablet (25 mg total) by mouth every 8 (eight) hours as needed for itching. 05/15/15   Hope Bunnie Pion, NP  Insulin Syringe-Needle U-100 26G X 1/2" 1 ML MISC Dispense as prescribed please 04/09/14   Tresa Garter, MD  Lancet Devices [facility]) lancets Use as instructed 12/20/14   Tresa Garter, MD  podofilox (CONDYLOX) 0.5 % external solution Apply topically 2 (two) times daily. For 3 consecutive days then off 4 days and repeat for 4 consecutive weeks. Patient not taking: Reported on 10/16/2015 06/25/15   Arnoldo Morale, MD  triamcinolone cream (KENALOG) 0.1 % Apply 1 application topically 2 (two) times daily. Patient not taking: Reported on 10/16/2015 05/22/15   Arnoldo Morale, MD    Physical Exam: Filed Vitals:   10/16/15 1442 10/16/15 1842 10/16/15 2256  BP: 143/88 136/70 150/84  Pulse: 118 118 122  Temp: 98.2 F (36.8 C)    TempSrc: Oral    Resp: _0 SpO2: 100% 98% 95%   General: Not in acute distress HEENT:       Eyes: PERRL, EOMI, no scleral icterus.       ENT: No discharge from the ears and nose, no pharynx injection, no tonsillar enlargement.        Neck: No JVD, no bruit, no mass felt. Heme: No neck lymph node enlargement. Cardiac: S1/S2, RRR, tachycardia, No murmurs,  No gallops or rubs. Pulm:  No rales, wheezing, rhonchi or rubs. Abd: Soft, nondistended, tenderness over periumbilical area, no rebound pain, no organomegaly, BS present. Ext: No pitting leg edema bilaterally. 2+DP/PT pulse bilaterally. Musculoskeletal: No joint deformities, No joint redness or warmth, no limitation of ROM in spin. Skin: molluscum contagiosum rashes diffusely  Neuro: Alert, oriented X3, cranial nerves II-XII grossly intact, muscle strength 5/5 in all extremities, sensation to light touch intact.  Psych: Patient is not psychotic, no suicidal or hemocidal ideation.  Labs on Admission:  Basic Metabolic Panel:  Recent Labs Lab 10/16/15 1508  NA 136  K 4.7  CL 96*  CO2 22  GLUCOSE 394*  BUN 12  CREATININE 0.96  CALCIUM 10.2   Liver Function Tests:  Recent Labs Lab 10/16/15 1508  AST 21  ALT 27  ALKPHOS 68  BILITOT 1.1  PROT 8.1  ALBUMIN 4.9    Recent Labs Lab 10/16/15 1508  LIPASE 595*   No results for input(s): AMMONIA in the  last 168 hours. CBC:  Recent Labs Lab 10/16/15 1216 10/16/15 1508  WBC 8.0 10.6*  NEUTROABS  --  7.4  HGB 14.9 18.2*  HCT 41.0 46.7  MCV 79.2 81.9  PLT 242 330   Cardiac Enzymes: No results for input(s): CKTOTAL, CKMB, CKMBINDEX, TROPONINI in the last 168 hours.  BNP (last 3 results) No results for input(s): BNP in the last 8760 hours.  ProBNP (last 3 results) No results for input(s): PROBNP in the last 8760 hours.  CBG:  Recent Labs Lab 10/16/15 1212 10/16/15 1443 10/16/15 2116 10/17/15  GLUCAP 419* 387* 351* 360*    Radiological Exams on Admission: Ct Abdomen Pelvis W Contrast  10/16/2015  CLINICAL DATA:  Awoke today with severe mid abdominal pain, nausea and vomiting. EXAM: CT ABDOMEN AND PELVIS WITH CONTRAST TECHNIQUE: Multidetector CT imaging of the abdomen and pelvis was performed using the standard protocol following bolus administration of intravenous contrast. CONTRAST:  28m OMNIPAQUE IOHEXOL 300  MG/ML SOLN, 1065mOMNIPAQUE IOHEXOL 300 MG/ML SOLN COMPARISON:  CT 11/18/2013 FINDINGS: Lower chest:  Mild dependent atelectasis in the right lower lobe. Liver: Prominent size. Diffusely decreased hepatic density consistent with steatosis. No focal lesion. Hepatobiliary: Gallbladder physiologically distended, no calcified stone. No biliary dilatation. Pancreas: Marked soft tissue stranding about the head of the pancreas and in the retroperitoneum. No ductal dilatation. Spleen: Normal in size. Adrenal glands: No nodule. Kidneys: Symmetric renal enhancement. No hydronephrosis. No perinephric stranding. Stomach/Bowel: The stomach is decompressed. There is marked inflammatory change about the duodenum with associated wall thickening. Diffuse soft tissue stranding is seen. Small amount of fluid is scan tracking anterior to the left perirenal fascia. No extraluminal air. Multiple adjacent lymph nodes in the mesentery. Remaining small bowel loops are normal. Small volume of colonic stool. The appendix is normal. Vascular/Lymphatic: No retroperitoneal adenopathy. Abdominal aorta is normal in caliber. 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 A normal right IVC is demonstrated. Reproductive: Prostate gland normal in size. Bladder: Decompressed.  No pelvic free flow Other: No free air or perforation. Musculoskeletal: There are no acute or suspicious osseous abnormalities. IMPRESSION: 1. Inflammatory changes in the retroperitoneum adjacent to the duodenum and pancreatic head. The overall distribution is similar to that of prior CT, however the degree of inflammation has increased. Differential considerations include duodenitis including peptic ulcer disease versus pancreatitis, possible groove pancreatitis. 2. Hepatic steatosis again seen. Electronically Signed   By: MeJeb Levering.D.   On: 10/16/2015 22:34    Assessment/Plan Principal Problem:   Acute  pancreatitis Active Problems:   Hyperglycemia   Nausea & vomiting   Diabetes (HCC)   Hypertension   DKA (diabetic ketoacidoses) (HCC)   Abdominal pain   Diabetes mellitus without complication (HCC)   SIRS (systemic inflammatory response syndrome) (HCC)   Molluscum contagiosum   Chest pain   Nausea, vomiting and abdominal pain due to acute pancreatitis: lipase 595, CT abdomen/pelvis is suggestive for pancreatitis. Possible etiologies include hypertriglyceridemia and alcohol abuse. No biliary dilatation by CT scan. His liver functions normal.  -will admit to SDU due to need to be on DKA protocol -IVF: 3L NS and then 125 cc/h -When necessary Zofran for nausea and morphine for pain -Nothing by mouth -IV protonix for possible alcoholic gastritis -abdominal ultrasound to re-evaluate pancreas, gallbladder, and bile ducts  -check lipid panel to rule out triglyceridemia.  Addendum: lipid profile showed triglyceride >1000, which is likely the etiology for  pancreatitis. Patient is currently on insulin drip for DKA. -Will continue insulin gtt for both DKA and hypertriglyceridemia. -lipid profile q4h for trending TG level  SIRS: patient admits criteria for service with tachycardia, tachypnea and leukocytosis. Hemodynamically stable. No sense of infection -will get Procalcitonin and trend lactic acid levels  -IVF: as above  Mild DKA: AG=18, with positive ketone by urinalysis, bicarbonate of 22. - 3L of NS bolus - start DKA protocol with BMP q4h - IVF: NS 125 cc/h; will switch to D5-1/2NS when CBG<250 - replete K as needed - Zofran prn nausea  - NPO  - blood culture x 2  DM-II: Last A1c 11.8 on 80/17/16, poorly controled. Patient is taking Lantus, NovoLog, metforminat home -on DKA protocol now -Check A1c  Mild chest pain: very atypical -EKG, telemetry -trop x 3 -prn Morphine  -ASA  Hx of Hyperglycemia: supposed to be on fenofibrate, but has not been compliant to these  medications. -Restart fenofibrate -Follow-up lipid profile  Molluscum contagiosum: was treated with Podophylline by PCP. Still has itches. -hydroxyzine -Triamcinolone cream  DVT ppx: SQ Lovenox  Code Status: Full code Family Communication:   Yes, patient's girl friend and brother at bed side Disposition Plan: Admit to inpatient   Date of Service 10/17/2015    Ivor Costa [facility] Pager [phone]  If 7PM-7AM, please contact night-coverage [email or web address] Password [facility] 10/17/2015, 12:05 AM

## 2015-10-16 NOTE — ED Notes (Signed)
Pt c/o abd pain and states he has took his insulin today, CBG high in triage. Pt also has been vomiting, started this morning.

## 2015-10-16 NOTE — ED Provider Notes (Signed)
CSN: [id number]     Arrival date & time 10/16/15  1432 History   First MD Initiated Contact with Patient 10/16/15 1810     Chief Complaint  Patient presents with  . Abdominal Pain    HPI   Brent Costa is a 25 y.o. male with a PMH of HTN, DM, GERD who presents to the ED with generalized abdominal pain, which he states started this morning and has been constant since that time. He reports associated nausea and vomiting, and states vomiting exacerbates his pain. He has not tried anything for symptom relief. He reports chills, though denies fever. He denies diarrhea, constipation, hematemesis, hematochezia, melena, dysuria, urgency, frequency, penile discharge.    Past Medical History  Diagnosis Date  . Hypertension   . Acid reflux   . Chronic bronchitis ([place])     "get it about q year" (11/20/2013)  . Diabetes mellitus without complication [facility])    Past Surgical History  Procedure Laterality Date  . Tonsillectomy and adenoidectomy  ~ 1999  . Esophagogastroduodenoscopy N/A 11/21/2013    Procedure: ESOPHAGOGASTRODUODENOSCOPY (EGD);  Surgeon: Wonda Horner, MD;  Location: [facility] ENDOSCOPY;  Service: Endoscopy;  Laterality: N/A;   Family History  Problem Relation Age of Onset  . Diabetes Mellitus II Father   . Heart failure Father   . Asthma Mother    Social History  Substance Use Topics  . Smoking status: Former Smoker -- 0.50 packs/day for 3 years    Types: Cigarettes    Quit date: 12/11/2013  . Smokeless tobacco: None  . Alcohol Use: No     Comment: 11/20/2013 "only drink at birthdays, parties, holidays"      Review of Systems  Constitutional: Positive for chills. Negative for fever.  Gastrointestinal: Positive for nausea, vomiting and abdominal pain. Negative for diarrhea, constipation and blood in stool.  Genitourinary: Negative for dysuria, urgency, frequency and discharge.  All other systems reviewed and are negative.     Allergies  Review of patient's allergies  indicates no known allergies.  Home Medications   Prior to Admission medications   Medication Sig Start Date End Date Taking? Authorizing Provider  insulin aspart (NOVOLOG) 100 UNIT/ML injection Blood Glucose 150 - 200, give 3 units Blood Glucose 201 - 250, give 5 units Blood Glucose 251 - 300, give 7 units Blood Glucose 301 - 350, give 10 units Patient taking differently: Inject 3-10 Units into the skin 3 (three) times daily. Blood Glucose 150 - 200, give 3 units Blood Glucose 201 - 250, give 5 units Blood Glucose 251 - 300, give 7 units Blood Glucose 301 - 350, give 10 units 05/22/15  Yes Arnoldo Morale, MD  Insulin Glargine (LANTUS) 100 UNIT/ML Solostar Pen Inject 45 Units into the skin daily at 10 pm. 06/25/15  Yes Arnoldo Morale, MD  lisinopril (PRINIVIL,ZESTRIL) 10 MG tablet Take 1 tablet (10 mg total) by mouth daily. 06/25/15  Yes Arnoldo Morale, MD  metFORMIN (GLUCOPHAGE) 500 MG tablet Take 2 tablets (1,000 mg total) by mouth 2 (two) times daily with a meal. 06/25/15  Yes Arnoldo Morale, MD  Blood Glucose Monitoring Suppl (ACCU-CHEK AVIVA PLUS) W/DEVICE KIT Check blood sugars 4 times daily (before meals and at bedtime) 12/20/14   Tresa Garter, MD  fenofibrate (TRICOR) 145 MG tablet Take 1 tablet (145 mg total) by mouth daily. Patient not taking: Reported on 10/16/2015 05/22/15   Arnoldo Morale, MD  glucose blood (ACCU-CHEK AVIVA) test strip Use as instructed 12/20/14  Tresa Garter, MD  hydrOXYzine (ATARAX/VISTARIL) 25 MG tablet Take 1 tablet (25 mg total) by mouth every 8 (eight) hours as needed for itching. 05/15/15   Hope Bunnie Pion, NP  Insulin Syringe-Needle U-100 26G X 1/2" 1 ML MISC Dispense as prescribed please 04/09/14   Tresa Garter, MD  Lancet Devices [facility]) lancets Use as instructed 12/20/14   Tresa Garter, MD  podofilox (CONDYLOX) 0.5 % external solution Apply topically 2 (two) times daily. For 3 consecutive days then off 4 days and repeat for 4  consecutive weeks. Patient not taking: Reported on 10/16/2015 06/25/15   Arnoldo Morale, MD  triamcinolone cream (KENALOG) 0.1 % Apply 1 application topically 2 (two) times daily. Patient not taking: Reported on 10/16/2015 05/22/15   Arnoldo Morale, MD    BP 136/70 mmHg  Pulse 118  Temp(Src) 98.2 F (36.8 C) (Oral)  Resp 22  SpO2 98% Physical Exam  Constitutional: He is oriented to person, place, and time. No distress.  Obese male, appears uncomfortable due to pain.  HENT:  Head: Normocephalic and atraumatic.  Right Ear: External ear normal.  Left Ear: External ear normal.  Nose: Nose normal.  Mouth/Throat: Uvula is midline, oropharynx is clear and moist and mucous membranes are normal.  Eyes: Conjunctivae, EOM and lids are normal. Pupils are equal, round, and reactive to light. Right eye exhibits no discharge. Left eye exhibits no discharge. No scleral icterus.  Neck: Normal range of motion. Neck supple.  Cardiovascular: Normal rate, regular rhythm, normal heart sounds, intact distal pulses and normal pulses.   Pulmonary/Chest: Effort normal and breath sounds normal. No respiratory distress. He has no wheezes. He has no rales.  Abdominal: Soft. Normal appearance and bowel sounds are normal. He exhibits no distension and no mass. There is tenderness. There is no rigidity, no rebound and no guarding.  Diffuse TTP of abdomen. No rebound, guarding, or masses.  Musculoskeletal: Normal range of motion. He exhibits no edema or tenderness.  Neurological: He is alert and oriented to person, place, and time. He has normal strength. No cranial nerve deficit or sensory deficit.  Skin: Skin is warm, dry and intact. He is not diaphoretic.  Diffuse papular rash to upper and lower extremities and trunk. Patient states this has been present for several months. No erythema, discharge, skin sloughing, vesicles. No signs of infection.  Psychiatric: He has a normal mood and affect. His speech is normal and  behavior is normal.  Nursing note and vitals reviewed.   ED Course  Procedures (including critical care time)  Labs Review Labs Reviewed  CBC WITH DIFFERENTIAL/PLATELET - Abnormal; Notable for the following:    WBC 10.6 (*)    Hemoglobin 18.2 (*)    MCHC >37.0 (*)    Monocytes Absolute 1.5 (*)    All other components within normal limits  COMPREHENSIVE METABOLIC PANEL - Abnormal; Notable for the following:    Chloride 96 (*)    Glucose, Bld 394 (*)    Anion gap 18 (*)    All other components within normal limits  URINALYSIS, ROUTINE W REFLEX MICROSCOPIC (NOT AT [facility]) - Abnormal; Notable for the following:    Specific Gravity, Urine 1.044 (*)    Glucose, UA >1000 (*)    Hgb urine dipstick TRACE (*)    Ketones, ur 40 (*)    Protein, ur 100 (*)    All other components within normal limits  LIPASE, BLOOD - Abnormal; Notable for the following:  Lipase 595 (*)    All other components within normal limits  URINE MICROSCOPIC-ADD ON - Abnormal; Notable for the following:    Squamous Epithelial / LPF 0-5 (*)    All other components within normal limits  CBG MONITORING, ED - Abnormal; Notable for the following:    Glucose-Capillary 387 (*)    All other components within normal limits  CBG MONITORING, ED - Abnormal; Notable for the following:    Glucose-Capillary 351 (*)    All other components within normal limits  I-STAT CHEM 8, ED  CBG MONITORING, ED  I-STAT CG4 LACTIC ACID, ED  I-STAT CG4 LACTIC ACID, ED    Imaging Review Ct Abdomen Pelvis W Contrast  10/16/2015  CLINICAL DATA:  Awoke today with severe mid abdominal pain, nausea and vomiting. EXAM: CT ABDOMEN AND PELVIS WITH CONTRAST TECHNIQUE: Multidetector CT imaging of the abdomen and pelvis was performed using the standard protocol following bolus administration of intravenous contrast. CONTRAST:  53m OMNIPAQUE IOHEXOL 300 MG/ML SOLN, 1016mOMNIPAQUE IOHEXOL 300 MG/ML SOLN COMPARISON:  CT 11/18/2013 FINDINGS: Lower  chest:  Mild dependent atelectasis in the right lower lobe. Liver: Prominent size. Diffusely decreased hepatic density consistent with steatosis. No focal lesion. Hepatobiliary: Gallbladder physiologically distended, no calcified stone. No biliary dilatation. Pancreas: Marked soft tissue stranding about the head of the pancreas and in the retroperitoneum. No ductal dilatation. Spleen: Normal in size. Adrenal glands: No nodule. Kidneys: Symmetric renal enhancement. No hydronephrosis. No perinephric stranding. Stomach/Bowel: The stomach is decompressed. There is marked inflammatory change about the duodenum with associated wall thickening. Diffuse soft tissue stranding is seen. Small amount of fluid is scan tracking anterior to the left perirenal fascia. No extraluminal air. Multiple adjacent lymph nodes in the mesentery. Remaining small bowel loops are normal. Small volume of colonic stool. The appendix is normal. Vascular/Lymphatic: No retroperitoneal adenopathy. Abdominal aorta is normal in caliber. 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 A normal right IVC is demonstrated. Reproductive: Prostate gland normal in size. Bladder: Decompressed.  No pelvic free flow Other: No free air or perforation. Musculoskeletal: There are no acute or suspicious osseous abnormalities. IMPRESSION: 1. Inflammatory changes in the retroperitoneum adjacent to the duodenum and pancreatic head. The overall distribution is similar to that of prior CT, however the degree of inflammation has increased. Differential considerations include duodenitis including peptic ulcer disease versus pancreatitis, possible groove pancreatitis. 2. Hepatic steatosis again seen. Electronically Signed   By: MeJeb Levering.D.   On: 10/16/2015 22:34     I have personally reviewed and evaluated these images and lab results as part of my medical decision-making.   EKG Interpretation None       MDM   Final diagnoses:  Abdominal pain  Acute pancreatitis, unspecified pancreatitis type  Non-intractable vomiting with nausea, vomiting of unspecified type    89ear old male presents with abdominal pain, nausea, and vomiting since this morning.  Patient is afebrile. Tachycardic to 120s. No hypotension. Abdomen soft, non-distended, with diffuse TTP. No rebound, guarding, or masses.  Labs pending. Will give fluids, pain medication, antiemetic.  CBC remarkable for leukocytosis of 10.6. [facility] remarkable for glucose 394, anion gap 18. Lipase elevated at 595. UA remarkable for glucose, ketones.  Patient given additional fluids and insulin per DKA protocol. CT abdomen pelvis pending.  Patient's significant other reports  he has been using alcohol for the past 2 weeks more frequently than usual. She states he occasionally drinks a 6 pack of beer.  Imaging remarkable for inflammatory changes in the retroperitoneum adjacent to the duodenum and pancreatic head. Hospitalist consulted for admission. Spoke with Dr. Blaine Hamper, will admit patient to stepdown further evaluation and management.  BP 136/70 mmHg  Pulse 118  Temp(Src) 98.2 F (36.8 C) (Oral)  Resp 22  SpO2 98%   Marella Chimes, PA-C 10/16/15 2312  Merrily Pew, MD 10/16/15 [phone]

## 2015-10-16 NOTE — ED Notes (Signed)
Notified PA,Elizbeth pt. CBG 351.RN,Terrance made aware.

## 2015-10-16 NOTE — ED Notes (Signed)
No answer x 3 when calling for recheck vital signs.

## 2015-10-16 NOTE — ED Notes (Signed)
[facility].

## 2015-10-17 ENCOUNTER — Encounter (HOSPITAL_COMMUNITY): Payer: Self-pay | Admitting: *Deleted

## 2015-10-17 DIAGNOSIS — K859 Acute pancreatitis without necrosis or infection, unspecified: Principal | ICD-10-CM

## 2015-10-17 DIAGNOSIS — R112 Nausea with vomiting, unspecified: Secondary | ICD-10-CM

## 2015-10-17 DIAGNOSIS — E781 Pure hyperglyceridemia: Secondary | ICD-10-CM

## 2015-10-17 DIAGNOSIS — B081 Molluscum contagiosum: Secondary | ICD-10-CM

## 2015-10-17 DIAGNOSIS — E1011 Type 1 diabetes mellitus with ketoacidosis with coma: Secondary | ICD-10-CM

## 2015-10-17 LAB — BASIC METABOLIC PANEL
ANION GAP: 14 (ref 5–15)
ANION GAP: 15 (ref 5–15)
ANION GAP: 20 — AB (ref 5–15)
BUN: 6 mg/dL (ref 6–20)
BUN: 7 mg/dL (ref 6–20)
BUN: 8 mg/dL (ref 6–20)
CALCIUM: 8.7 mg/dL — AB (ref 8.9–10.3)
CALCIUM: 8.8 mg/dL — AB (ref 8.9–10.3)
CALCIUM: 9 mg/dL (ref 8.9–10.3)
CO2: 16 mmol/L — ABNORMAL LOW (ref 22–32)
CO2: 19 mmol/L — ABNORMAL LOW (ref 22–32)
CO2: 21 mmol/L — ABNORMAL LOW (ref 22–32)
CREATININE: 0.92 mg/dL (ref 0.61–1.24)
CREATININE: 0.96 mg/dL (ref 0.61–1.24)
Chloride: 104 mmol/L (ref 101–111)
Chloride: 105 mmol/L (ref 101–111)
Chloride: 106 mmol/L (ref 101–111)
Creatinine, Ser: 1.08 mg/dL (ref 0.61–1.24)
GFR calc Af Amer: >60 mL/min (ref >60)
GFR calc Af Amer: >60 mL/min (ref >60)
GLUCOSE: 281 mg/dL — AB (ref 65–99)
GLUCOSE: 324 mg/dL — AB (ref 65–99)
GLUCOSE: 364 mg/dL — AB (ref 65–99)
Potassium: 4.5 mmol/L (ref 3.5–5.1)
Potassium: 4.7 mmol/L (ref 3.5–5.1)
Potassium: 4.1 mmol/L (ref 3.5–5.1)
Sodium: 139 mmol/L (ref 135–145)
Sodium: 140 mmol/L (ref 135–145)
Sodium: 141 mmol/L (ref 135–145)

## 2015-10-17 LAB — LIPID PANEL
CHOL/HDL RATIO: 52.1 ratio
CHOLESTEROL: 834 mg/dL — AB (ref 0–200)
Cholesterol: 465 mg/dL — ABNORMAL HIGH (ref 0–200)
HDL: 16 mg/dL — AB (ref >40)
LDL Cholesterol: UNDETERMINED mg/dL (ref 0–99)
LDL Cholesterol: UNDETERMINED mg/dL (ref 0–99)
Triglycerides: >1000 mg/dL — ABNORMAL HIGH (ref <150)
Triglycerides: 3108 mg/dL — ABNORMAL HIGH (ref <150)
VLDL: UNDETERMINED mg/dL (ref 0–40)
VLDL: UNDETERMINED mg/dL (ref 0–40)

## 2015-10-17 LAB — PROCALCITONIN: Procalcitonin: 0.46 ng/mL

## 2015-10-17 LAB — GLUCOSE, CAPILLARY
GLUCOSE-CAPILLARY: 242 mg/dL — AB (ref 65–99)
GLUCOSE-CAPILLARY: 247 mg/dL — AB (ref 65–99)
Glucose-Capillary: 302 mg/dL — ABNORMAL HIGH (ref 65–99)
Glucose-Capillary: 264 mg/dL — ABNORMAL HIGH (ref 65–99)
Glucose-Capillary: 299 mg/dL — ABNORMAL HIGH (ref 65–99)

## 2015-10-17 LAB — CBC WITH DIFFERENTIAL/PLATELET
BASOS ABS: 0 10*3/uL (ref 0.0–0.1)
Basophils Relative: 0 %
EOS ABS: 0.1 10*3/uL (ref 0.0–0.7)
Eosinophils Relative: 1 %
HCT: 46.7 % (ref 39.0–52.0)
HEMOGLOBIN: 18.2 g/dL — AB (ref 13.0–17.0)
LYMPHS ABS: 1.6 10*3/uL (ref 0.7–4.0)
Lymphocytes Relative: 15 %
MCH: 30.7 pg (ref 26.0–34.0)
MCHC: 38.8 g/dL — ABNORMAL HIGH (ref 30.0–36.0)
MCV: 81.9 fL (ref 78.0–100.0)
MONO ABS: 1.5 10*3/uL — AB (ref 0.1–1.0)
MONOS PCT: 14 %
NEUTROS ABS: 7.4 10*3/uL (ref 1.7–7.7)
Neutrophils Relative %: 70 %
Platelets: 330 10*3/uL (ref 150–400)
RBC: 5.7 MIL/uL (ref 4.22–5.81)
RDW: 13.4 % (ref 11.5–15.5)
WBC: 10.6 10*3/uL — ABNORMAL HIGH (ref 4.0–10.5)

## 2015-10-17 LAB — CBG MONITORING, ED
GLUCOSE-CAPILLARY: 293 mg/dL — AB (ref 65–99)
GLUCOSE-CAPILLARY: 293 mg/dL — AB (ref 65–99)
GLUCOSE-CAPILLARY: 264 mg/dL — AB (ref 65–99)
GLUCOSE-CAPILLARY: 246 mg/dL — AB (ref 65–99)
Glucose-Capillary: 305 mg/dL — ABNORMAL HIGH (ref 65–99)
Glucose-Capillary: 315 mg/dL — ABNORMAL HIGH (ref 65–99)
Glucose-Capillary: 261 mg/dL — ABNORMAL HIGH (ref 65–99)
Glucose-Capillary: 224 mg/dL — ABNORMAL HIGH (ref 65–99)

## 2015-10-17 LAB — PROTIME-INR
INR: 1.02 (ref 0.00–1.49)
Prothrombin Time: 13.6 seconds (ref 11.6–15.2)

## 2015-10-17 LAB — TROPONIN I: Troponin I: <0.03 ng/mL (ref <0.031)

## 2015-10-17 LAB — HIV ANTIBODY (ROUTINE TESTING W REFLEX): HIV SCREEN 4TH GENERATION: NONREACTIVE

## 2015-10-17 LAB — RAPID URINE DRUG SCREEN, HOSP PERFORMED
Amphetamines: NOT DETECTED
BARBITURATES: NOT DETECTED
Benzodiazepines: NOT DETECTED
COCAINE: NOT DETECTED
Opiates: NOT DETECTED
TETRAHYDROCANNABINOL: NOT DETECTED

## 2015-10-17 LAB — LACTIC ACID, PLASMA
LACTIC ACID, VENOUS: 2 mmol/L (ref 0.5–2.0)
Lactic Acid, Venous: 1.9 mmol/L (ref 0.5–2.0)

## 2015-10-17 LAB — I-STAT CG4 LACTIC ACID, ED: LACTIC ACID, VENOUS: 1.66 mmol/L (ref 0.5–2.0)

## 2015-10-17 LAB — APTT: APTT: 28 s (ref 24–37)

## 2015-10-17 MED ORDER — PANTOPRAZOLE SODIUM 40 MG IV SOLR
40.0000 mg | INTRAVENOUS | Status: DC
  Administered 2015-10-18 – 2015-10-26 (×9): 40 mg via INTRAVENOUS
  Filled 2015-10-17 (×9): qty 40

## 2015-10-17 MED ORDER — OMEGA-3-ACID ETHYL ESTERS 1 G PO CAPS
1.0000 g | ORAL_CAPSULE | Freq: Two times a day (BID) | ORAL | Status: DC
  Administered 2015-10-18 – 2015-10-26 (×17): 1 g via ORAL
  Filled 2015-10-17 (×21): qty 1

## 2015-10-17 MED ORDER — ONDANSETRON HCL 4 MG/2ML IJ SOLN
4.0000 mg | Freq: Four times a day (QID) | INTRAMUSCULAR | Status: DC | PRN
  Administered 2015-10-17 – 2015-10-25 (×8): 4 mg via INTRAVENOUS
  Filled 2015-10-17 (×9): qty 2

## 2015-10-17 MED ORDER — INSULIN GLARGINE 100 UNIT/ML ~~LOC~~ SOLN
30.0000 [IU] | Freq: Every day | SUBCUTANEOUS | Status: DC
  Administered 2015-10-18: 30 [IU] via SUBCUTANEOUS
  Filled 2015-10-17: qty 0.3

## 2015-10-17 MED ORDER — ASPIRIN 325 MG PO TABS
325.0000 mg | ORAL_TABLET | Freq: Every day | ORAL | Status: DC
Start: 2015-10-17 — End: 2015-10-17

## 2015-10-17 MED ORDER — MORPHINE SULFATE (PF) 2 MG/ML IV SOLN
2.0000 mg | INTRAVENOUS | Status: DC | PRN
  Administered 2015-10-17 – 2015-10-22 (×29): 2 mg via INTRAVENOUS
  Filled 2015-10-17 (×29): qty 1

## 2015-10-17 MED ORDER — PANTOPRAZOLE SODIUM 40 MG IV SOLR
40.0000 mg | Freq: Two times a day (BID) | INTRAVENOUS | Status: DC
  Administered 2015-10-17: 40 mg via INTRAVENOUS
  Filled 2015-10-17 (×2): qty 40

## 2015-10-17 MED ORDER — SUCRALFATE 1 GM/10ML PO SUSP
1.0000 g | Freq: Three times a day (TID) | ORAL | Status: DC
  Filled 2015-10-17 (×5): qty 10

## 2015-10-17 MED ORDER — ENOXAPARIN SODIUM 40 MG/0.4ML ~~LOC~~ SOLN
40.0000 mg | Freq: Every day | SUBCUTANEOUS | Status: DC
  Administered 2015-10-17 – 2015-10-26 (×10): 40 mg via SUBCUTANEOUS
  Filled 2015-10-17 (×10): qty 0.4

## 2015-10-17 MED ORDER — INSULIN GLARGINE 100 UNIT/ML ~~LOC~~ SOLN
35.0000 [IU] | Freq: Every day | SUBCUTANEOUS | Status: DC
  Administered 2015-10-17: 35 [IU] via SUBCUTANEOUS
  Filled 2015-10-17: qty 0.35

## 2015-10-17 MED ORDER — INSULIN ASPART 100 UNIT/ML ~~LOC~~ SOLN
0.0000 [IU] | SUBCUTANEOUS | Status: DC
  Administered 2015-10-17: 5 [IU] via SUBCUTANEOUS
  Administered 2015-10-17: 8 [IU] via SUBCUTANEOUS
  Administered 2015-10-18 (×3): 5 [IU] via SUBCUTANEOUS

## 2015-10-17 NOTE — Progress Notes (Signed)
NG Tube pulled out per patient  D Brent GivensFranklin RN

## 2015-10-17 NOTE — Progress Notes (Signed)
[facility] PROGRESS NOTE  Brent Costa VHQ:[id number] DOB: 03/01/1991 DOA: 10/16/2015 PCP: Jeanann Lewandowsky, MD  Brief narrative 25 year old obese male with uncontrolled diabetes mellitus on insulin ( last A1c >10, non-adherent to diet and non-compliant with his insulin), hypertriglyceridemia (noncompliant with medication), hypertension, GERD, molluscum contagiosum, ongoing tobacco and alcohol use who presented to the ED with nausea, vomiting and abdominal pain since the day of admission. Patient reports vomiting more than 10 times mainly including food particle and without any blood. Denied any fevers, chills, shortness of breath, dysuria or diarrhea. He had epigastric pain 10/10 in severity nonradiating without any aggravating or relieving factors. Reports drinking 2 beers daily for the past 3 days. In the ED his lipase was elevated to 595, had significantly high triglycerides >1000, blood glucose in 400s with anion gap. Patient admitted for DKA and started on insulin drip. Also being managed for acute pancreatitis. A CT of his abdomen and pelvis showed inflammatory changes in the retroperitoneum adjacent to the duodenum and pancreatic head along with fatty liver.   Assessment/Plan: SIRS secondary to acute pancreatitis and DKA Initially admitted to stepdown unit but since he was hemodynamically stable and anion gap closed transfer him to telemetry from the ED. Likely contributed are all acute pancreatitis is hypertriglyceridemia and ongoing alcohol use. CT and ultrasound abdomen shows no biliary abnormality. LFTs are normal. -Continue aggressive IV fluids. Pain control with when necessary morphine and antiemetics. Keep nothing by mouth. -Added IV Protonix twice a day for acute gastritis/duodenitis. -Markedly elevated triglycerides noted. ( 3100 this am)  Patient not taking fibrate. (Levels were >900 when checked recently as outpatient) fibrate resumed.  Will d/w GI. Pt may need  apheresis give such high TAG. If not will resume insulin drip.   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 Resumed fibrate. Monitor levels daily. GI consulted.  Mild DKA Given 2 L IV normal saline bolus. Glucose stabilizer protocol initiated in the ED. Anion gap closed. Will  start him on his home dose Lantus and sliding scale coverage. Patient noncompliant with his diet and medications.  Essential hypertension Resume home medications   Tobacco use Counseled on cessation.   Molluscum contagiosum Was treated with fortified in by PCP but still has itching. Added hydroxyzine and triamcinolone cream   DVT prophylaxis: Subcutaneous Lovenox Diet: Nothing by mouth  Code Status: full code Family Communication: Friend at bedside Disposition Plan: Currently inpatient   Consultants:  GI  Procedures:  CT abdomen  Ultrasound abdomen  Antibiotics:  None  HPI/Subjective: Patient seen and examined. Complains of IBS treatment pain. No further vomiting.  Objective: Filed Vitals:   10/17/15 0652 10/17/15 0937  BP: 128/107 136/72  Pulse: 117 129  Temp:  98 F (36.7 C)  Resp: 33 20   No intake or output data in the 24 hours ending 10/17/15 1114 There were no vitals filed for this visit.  Exam:   General:  [name] in some distress with pain  HEENT: No pallor, dry mucosa, no icterus  Chest: Clear bilaterally, no added sounds  CVS: Normal S1 and S2, no murmurs or gallop  GI: Soft, nondistended, epigastric tenderness to pressure, bowel sounds present  Musculoskeletal: Warm, diffuse molluscum contagiosum rashes.  Data Reviewed: Basic Metabolic Panel:  Recent Labs Lab 10/16/15 1508 10/17/15 0005 10/17/15 0337 10/17/15 0647  NA 136 140 139 141  K 4.7 4.7 4.5 4.1  CL 96* 104 105 106  CO2 22 16* 19* 21*  GLUCOSE 394* 364* 324* 281*  BUN  12 6 8 7   CREATININE 0.96 1.08 0.92 0.96  CALCIUM 10.2 9.0 8.8* 8.7*   Liver Function Tests:  Recent Labs Lab  10/16/15 1508  AST 21  ALT 27  ALKPHOS 68  BILITOT 1.1  PROT 8.1  ALBUMIN 4.9    Recent Labs Lab 10/16/15 1508  LIPASE 595*   No results for input(s): AMMONIA in the last 168 hours. CBC:  Recent Labs Lab 10/16/15 1216 10/16/15 1508  WBC 8.0 10.6*  NEUTROABS  --  7.4  HGB 14.9 18.2*  HCT 41.0 46.7  MCV 79.2 81.9  PLT 242 330   Cardiac Enzymes:  Recent Labs Lab 10/17/15 0005 10/17/15 0647  TROPONINI <0.03 <0.03   BNP (last 3 results) No results for input(s): BNP in the last 8760 hours.  ProBNP (last 3 results) No results for input(s): PROBNP in the last 8760 hours.  CBG:  Recent Labs Lab 10/17/15 0535 10/17/15 0646 10/17/15 0734 10/17/15 0915 10/17/15 1021  GLUCAP 293* 264* 261* 224* 246*    No results found for this or any previous visit (from the past 240 hour(s)).   Studies: [place] Abdomen Complete  10/17/2015  CLINICAL DATA:  Initial evaluation for acute pancreatitis. EXAM: ABDOMEN ULTRASOUND COMPLETE COMPARISON:  Prior CT from 10/16/2015. FINDINGS: Gallbladder: No gallstones or wall thickening visualized. No sonographic Murphy sign noted by sonographer. Common bile duct: Diameter: 2.8 mm Liver: No focal lesion identified. Within normal limits in parenchymal echogenicity. IVC: No abnormality visualized. Pancreas: Not well visualized, with known inflammatory changes not well seen. Probable duodenal wall thickening noted, similar to previous CT. Spleen: Size and appearance within normal limits. Right Kidney: Length: 12.8 cm. Echogenicity within normal limits. No mass or hydronephrosis visualized. Left Kidney: Length: 14.0 cm. Echogenicity within normal limits. No mass or hydronephrosis visualized. Abdominal aorta: No aneurysm visualized. Other findings: None. IMPRESSION: 1. Poor visualization of known inflammatory changes within the pancreas, although duodenal wall thickening appears grossly similar relative to recent CT. 2. Otherwise normal abdominal  ultrasound. No cholelithiasis, choledocholithiasis, or biliary dilatation. Electronically Signed   By: Rise MuBenjamin  McClintock M.D.   On: 10/17/2015 01:00   Ct Abdomen Pelvis W Contrast  10/16/2015  CLINICAL DATA:  Awoke today with severe mid abdominal pain, nausea and vomiting. EXAM: CT ABDOMEN AND PELVIS WITH CONTRAST TECHNIQUE: Multidetector CT imaging of the abdomen and pelvis was performed using the standard protocol following bolus administration of intravenous contrast. CONTRAST:  25mL OMNIPAQUE IOHEXOL 300 MG/ML SOLN, 100mL OMNIPAQUE IOHEXOL 300 MG/ML SOLN COMPARISON:  CT 11/18/2013 FINDINGS: Lower chest:  Mild dependent atelectasis in the right lower lobe. Liver: Prominent size. Diffusely decreased hepatic density consistent with steatosis. No focal lesion. Hepatobiliary: Gallbladder physiologically distended, no calcified stone. No biliary dilatation. Pancreas: Marked soft tissue stranding about the head of the pancreas and in the retroperitoneum. No ductal dilatation. Spleen: Normal in size. Adrenal glands: No nodule. Kidneys: Symmetric renal enhancement. No hydronephrosis. No perinephric stranding. Stomach/Bowel: The stomach is decompressed. There is marked inflammatory change about the duodenum with associated wall thickening. Diffuse soft tissue stranding is seen. Small amount of fluid is scan tracking anterior to the left perirenal fascia. No extraluminal air. Multiple adjacent lymph nodes in the mesentery. Remaining small bowel loops are normal. Small volume of colonic stool. The appendix is normal. Vascular/Lymphatic: No retroperitoneal adenopathy. Abdominal aorta is normal in caliber. 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 A normal right IVC is demonstrated. Reproductive:  Prostate gland normal in size. Bladder: Decompressed.  No pelvic free flow Other: No free air or perforation. Musculoskeletal: There are no acute or suspicious  osseous abnormalities. IMPRESSION: 1. Inflammatory changes in the retroperitoneum adjacent to the duodenum and pancreatic head. The overall distribution is similar to that of prior CT, however the degree of inflammation has increased. Differential considerations include duodenitis including peptic ulcer disease versus pancreatitis, possible groove pancreatitis. 2. Hepatic steatosis again seen. Electronically Signed   By: Rubye Oaks M.D.   On: 10/16/2015 22:34    Scheduled Meds: . enoxaparin (LOVENOX) injection  40 mg Subcutaneous Daily  . insulin glargine  35 Units Subcutaneous Daily  . lisinopril  10 mg Oral Daily  . pantoprazole (PROTONIX) IV  40 mg Intravenous Q12H  . sucralfate  1 g Oral TID WC & HS  . triamcinolone cream  1 application Topical BID   Continuous Infusions: . sodium chloride Stopped (10/17/15 0924)  . dextrose 5 % and 0.45% NaCl 125 mL/hr at 10/17/15 0931  . insulin (NOVOLIN-R) infusion 16.7 Units/hr (10/17/15 1032)     Time spent: 35 minutes    Nizar Cutler  [facility] Pager [phone] If 7PM-7AM, please contact night-coverage at [email or web address], password [facility] 10/17/2015, 11:14 AM  LOS: 1 day

## 2015-10-17 NOTE — Progress Notes (Signed)
Dr, Gonzella Lexhungel page about CBG checks, and Sliding scale insulin D Susann GivensFranklin RN

## 2015-10-17 NOTE — Progress Notes (Signed)
Patient refused to reinsert NG tube.  Sharrell Ku Trindon Dorton RN

## 2015-10-17 NOTE — Hospital Discharge Follow-Up (Signed)
This Case Manager received communication from Marval RegalKim Gibbs, RN CM that patient needing hospital follow-up appointment.  [facility] Appointment scheduled for 10/25/15 at 1200 with Dr. Venetia NightAmao.  AVS updated. Ricard DillonKim Gibb, RN CM updated.

## 2015-10-17 NOTE — Progress Notes (Signed)
[facility]cp jegede uninsured DKA 2 ED visits 1 admission

## 2015-10-17 NOTE — Consult Note (Signed)
[facility] Consult: 12:23 PM 10/17/2015  LOS: 1 day    Referring Provider: Dr Clementeen Graham Primary Care Physician:  Angelica Chessman, MD Primary Gastroenterologist:  Dr. Penelope Coop saw pt, inpt only, in 11/2013    Reason for Consultation:  Acute pancreatitis.     HPI: Brent Costa is a 25 y.o. male.  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 Microalbuminuria.  Hypertriglyceridemia (966 in 12/2014).  Fatty liver on 11/2013 CT.  Molluscum contagiosum. Pt does not drink ETOH.    11/2013 EGD for epigastric pain and CT with inflammatory appearance in region of duodenum.  Dr Penelope Coop.  Normal study.  LFTs then normal, Lipase not assayed.    Started Tricor for elevated triglycerides in 12/2014 but ran out of this ~ 2 months ago.  Has frequent heartburn and uses many different OTC antacids, H2 blockers and PPIs.  Generally he eats well, no NV. On Wednesday AM 10/16/15 he awoke with non-bloody, bilious N/V and mid/upper abdominal pain radiating across entire belly.  Presented to ED.   Lipase 595,  Renal function and LFTs normal.  Glucose to 420  Anion gap 20, now normal. Triglycerides 3108 CT: Inflammatory changes in the retroperitoneum adjacent to the duodenum and pancreatic head. Distribution similar to 11/2013 CT, however degree of inflammation has increased. Stranding in region of HOP.  Liver is fatty and prominent.   Ultrasound: area of inflammation on CT poorly visd on ultrasound. Bile duct 2.8 mm.  No GB sotnes/sludge/wallthickening.    Current treatment consists of IV Protonix, morphine when necessary, insulin drip, Lovaza.   Received 1 liter bolus of NS and NS drip after.  Still vomiting large amounts, at least 300 cc for me just now: yellow/bilious/non-bloody and this relieves the  pain.  Morphine also helps the pain. Can't take a deep breath as it increases the pain.  Not SOB.  No chest pain.  No oliguria. + Hiccups.      Past Medical History  Diagnosis Date  . Hypertension   . Acid reflux   . Chronic bronchitis ([place])     "get it about q year" (11/20/2013)  . DM type 1 (diabetes mellitus, type 1) ([place]) 11/2013    , went straight to Insulin.   Marland Kitchen Hypertriglyceridemia   . Microalbuminuria   . Molluscum contagiosum 06/2015    upper extremities.     Past Surgical History  Procedure Laterality Date  . Tonsillectomy and adenoidectomy  ~ 1999  . Esophagogastroduodenoscopy N/A 11/21/2013    Procedure: ESOPHAGOGASTRODUODENOSCOPY (EGD);  Surgeon: Wonda Horner, MD;  Location: [facility] ENDOSCOPY;  Service: Endoscopy;  Laterality: N/A;    Prior to Admission medications   Medication Sig Start Date End Date Taking? Authorizing Provider  insulin aspart (NOVOLOG) 100 UNIT/ML injection Blood Glucose 150 - 200, give 3 units Blood Glucose 201 - 250, give 5 units Blood Glucose 251 - 300, give 7 units Blood Glucose 301 - 350, give 10 units Patient taking differently: Inject 3-10 Units into the skin 3 (three) times daily. Blood  Glucose 150 - 200, give 3 units Blood Glucose 201 - 250, give 5 units Blood Glucose 251 - 300, give 7 units Blood Glucose 301 - 350, give 10 units 05/22/15  Yes Arnoldo Morale, MD  Insulin Glargine (LANTUS) 100 UNIT/ML Solostar Pen Inject 45 Units into the skin daily at 10 pm. 06/25/15  Yes Arnoldo Morale, MD  lisinopril (PRINIVIL,ZESTRIL) 10 MG tablet Take 1 tablet (10 mg total) by mouth daily. 06/25/15  Yes Arnoldo Morale, MD  metFORMIN (GLUCOPHAGE) 500 MG tablet Take 2 tablets (1,000 mg total) by mouth 2 (two) times daily with a meal. 06/25/15  Yes Arnoldo Morale, MD  Blood Glucose Monitoring Suppl (ACCU-CHEK AVIVA PLUS) W/DEVICE KIT Check blood sugars 4 times daily (before meals and at bedtime) 12/20/14   Tresa Garter, MD  fenofibrate (TRICOR) 145 MG tablet Take 1  tablet (145 mg total) by mouth daily. Patient not taking: Reported on 10/16/2015 05/22/15   Arnoldo Morale, MD  glucose blood (ACCU-CHEK AVIVA) test strip Use as instructed 12/20/14   Tresa Garter, MD  hydrOXYzine (ATARAX/VISTARIL) 25 MG tablet Take 1 tablet (25 mg total) by mouth every 8 (eight) hours as needed for itching. 05/15/15   Hope Bunnie Pion, NP  Insulin Syringe-Needle U-100 26G X 1/2" 1 ML MISC Dispense as prescribed please 04/09/14   Tresa Garter, MD  Lancet Devices [facility]) lancets Use as instructed 12/20/14   Tresa Garter, MD  podofilox (CONDYLOX) 0.5 % external solution Apply topically 2 (two) times daily. For 3 consecutive days then off 4 days and repeat for 4 consecutive weeks. Patient not taking: Reported on 10/16/2015 06/25/15   Arnoldo Morale, MD  triamcinolone cream (KENALOG) 0.1 % Apply 1 application topically 2 (two) times daily. Patient not taking: Reported on 10/16/2015 05/22/15   Arnoldo Morale, MD    Scheduled Meds: . enoxaparin (LOVENOX) injection  40 mg Subcutaneous Daily  . insulin glargine  35 Units Subcutaneous Daily  . lisinopril  10 mg Oral Daily  . omega-3 acid ethyl esters  1 g Oral BID  . pantoprazole (PROTONIX) IV  40 mg Intravenous Q12H  . sucralfate  1 g Oral TID WC & HS  . triamcinolone cream  1 application Topical BID   Infusions: . sodium chloride Stopped (10/17/15 0924)  . dextrose 5 % and 0.45% NaCl 125 mL/hr at 10/17/15 0931  . insulin (NOVOLIN-R) infusion 16.7 Units/hr (10/17/15 1032)   PRN Meds: hydrOXYzine, morphine injection, ondansetron   Allergies as of 10/16/2015  . (No Known Allergies)    Family History  Problem Relation Age of Onset  . Diabetes Mellitus II Father   . Heart failure Father   . Asthma Mother     Social History   Social History  . Marital Status: Single    Spouse Name: N/A  . Number of Children: N/A  . Years of Education: N/A   Occupational History  . Not on file.   Social History Main  Topics  . Smoking status: Former Smoker -- 0.50 packs/day for 3 years    Types: Cigarettes    Quit date: 12/11/2013  . Smokeless tobacco: Not on file  . Alcohol Use: No     Comment: 11/20/2013 "only drink at birthdays, parties, holidays"  . Drug Use: No     Comment: 11/20/2013 "used to use marijuana; used it last < 1 month ago"  . Sexual Activity: Yes   Other Topics Concern  . Not on file   Social History  Narrative    REVIEW OF SYSTEMS: Constitutional:  And measured weight is 8 pounds lower than in 05/2015. Generally he doesn't have fatigue. ENT:  No nose bleeds.  No congestion, excessive sneezing or nasal discharge. Pulm:  Per HPI CV:  No palpitations, no LE edema.  GU:  No hematuria, no frequency GI:  Per HPI.  No dysphagia.  Heme:  No unusual bleeding or excessive bruising.   Transfusions:  No previous transfusions. Neuro:  No headaches, no peripheral tingling or numbness Derm:  He has molluscum all over his body. Endocrine:  No sweats or chills.  No polyuria or dysuria Immunization:  Did not inquire as to his recent immunizations. Reviewed Epic records and none listed. Travel:  None beyond local counties in last few months.    PHYSICAL EXAM: Vital signs in last 24 hours: Filed Vitals:   10/17/15 0652 10/17/15 0937  BP: 128/107 136/72  Pulse: 117 129  Temp:  98 F (36.7 C)  Resp: 33 20   Wt Readings from Last 3 Encounters:  10/16/15 122.471 kg (270 lb)  06/25/15 123.378 kg (272 lb)  05/22/15 126.1 kg (278 lb)    General: Obese, uncomfortable, unwell-appearing AAM. Head:  No facial swelling or asymmetry. No signs of head trauma.  Eyes:  No scleral icterus. No conjunctival pallor. EOMI. Ears:  Not hard of hearing  Nose:  No congestion or nasal discharge. Mouth:  Clear, moist mucous membranes. Good dentition. Neck:  No JVD, no masses. Neck is large. No TMG. Lungs:  Clear to auscultation and percussion bilaterally. No dyspnea or cough. Heart: Tachycardia in the  120s. Regular. S1-S2 audible. Abdomen:  Large/obese. Soft. No succession splash. No organomegaly. No bruits, no hernias. Moderate tenderness throughout upper and mid abdomen which is nonfocal..   Rectal: Deferred   Musc/Skeltl: no joint erythema or swelling. Extremities:  No CCE.  Neurologic:  Alert, though distracted by his pain. Oriented 3. Skin:  Molluscum lesions on legs arms. Tattoos:  Many large tattoo seen on the arms. Nodes:  No cervical adenopathy.   Psych:  Affect depressed consistent with acute pain and illness. Appropriate. Somewhat anxious/agitated with the pain.  Intake/Output from previous day:   Intake/Output this shift:    LAB RESULTS:  Recent Labs  10/16/15 1216 10/16/15 1508  WBC 8.0 10.6*  HGB 14.9 18.2*  HCT 41.0 46.7  PLT 242 330   BMET Lab Results  Component Value Date   NA 141 10/17/2015   NA 139 10/17/2015   NA 140 10/17/2015   K 4.1 10/17/2015   K 4.5 10/17/2015   K 4.7 10/17/2015   CL 106 10/17/2015   CL 105 10/17/2015   CL 104 10/17/2015   CO2 21* 10/17/2015   CO2 19* 10/17/2015   CO2 16* 10/17/2015   GLUCOSE 281* 10/17/2015   GLUCOSE 324* 10/17/2015   GLUCOSE 364* 10/17/2015   BUN 7 10/17/2015   BUN 8 10/17/2015   BUN 6 10/17/2015   CREATININE 0.96 10/17/2015   CREATININE 0.92 10/17/2015   CREATININE 1.08 10/17/2015   CALCIUM 8.7* 10/17/2015   CALCIUM 8.8* 10/17/2015   CALCIUM 9.0 10/17/2015   LFT  Recent Labs  10/16/15 1508  PROT 8.1  ALBUMIN 4.9  AST 21  ALT 27  ALKPHOS 68  BILITOT 1.1   PT/INR Lab Results  Component Value Date   INR 1.02 10/17/2015   Hepatitis Panel No results for input(s): HEPBSAG, HCVAB, HEPAIGM, HEPBIGM in the last 72 hours. C-Diff No components found  for: CDIFF Lipase     Component Value Date/Time   LIPASE 595* 10/16/2015 1508    Drugs of Abuse     Component Value Date/Time   LABOPIA NONE DETECTED 10/16/2015 2351   COCAINSCRNUR NONE DETECTED 10/16/2015 2351   LABBENZ NONE  DETECTED 10/16/2015 2351   AMPHETMU NONE DETECTED 10/16/2015 2351   THCU NONE DETECTED 10/16/2015 2351   LABBARB NONE DETECTED 10/16/2015 2351     RADIOLOGY STUDIES: US Abdomen Complete  10/17/2015  CLINICAL DATA:  Initial evaluation for acute pancreatitis. EXAM: ABDOMEN ULTRASOUND COMPLETE COMPARISON:  Prior CT from 10/16/2015. FINDINGS: Gallbladder: No gallstones or wall thickening visualized. No sonographic Murphy sign noted by sonographer. Common bile duct: Diameter: 2.8 mm Liver: No focal lesion identified. Within normal limits in parenchymal echogenicity. IVC: No abnormality visualized. Pancreas: Not well visualized, with known inflammatory changes not well seen. Probable duodenal wall thickening noted, similar to previous CT. Spleen: Size and appearance within normal limits. Right Kidney: Length: 12.8 cm. Echogenicity within normal limits. No mass or hydronephrosis visualized. Left Kidney: Length: 14.0 cm. Echogenicity within normal limits. No mass or hydronephrosis visualized. Abdominal aorta: No aneurysm visualized. Other findings: None. IMPRESSION: 1. Poor visualization of known inflammatory changes within the pancreas, although duodenal wall thickening appears grossly similar relative to recent CT. 2. Otherwise normal abdominal ultrasound. No cholelithiasis, choledocholithiasis, or biliary dilatation. Electronically Signed   By: Jeannine Boga M.D.   On: 10/17/2015 01:00   Ct Abdomen Pelvis W Contrast  10/16/2015  CLINICAL DATA:  Awoke today with severe mid abdominal pain, nausea and vomiting. EXAM: CT ABDOMEN AND PELVIS WITH CONTRAST TECHNIQUE: Multidetector CT imaging of the abdomen and pelvis was performed using the standard protocol following bolus administration of intravenous contrast. CONTRAST:  23m OMNIPAQUE IOHEXOL 300 MG/ML SOLN, 1087mOMNIPAQUE IOHEXOL 300 MG/ML SOLN COMPARISON:  CT 11/18/2013 FINDINGS: Lower chest:  Mild dependent atelectasis in the right lower lobe. Liver:  Prominent size. Diffusely decreased hepatic density consistent with steatosis. No focal lesion. Hepatobiliary: Gallbladder physiologically distended, no calcified stone. No biliary dilatation. Pancreas: Marked soft tissue stranding about the head of the pancreas and in the retroperitoneum. No ductal dilatation. Spleen: Normal in size. Adrenal glands: No nodule. Kidneys: Symmetric renal enhancement. No hydronephrosis. No perinephric stranding. Stomach/Bowel: The stomach is decompressed. There is marked inflammatory change about the duodenum with associated wall thickening. Diffuse soft tissue stranding is seen. Small amount of fluid is scan tracking anterior to the left perirenal fascia. No extraluminal air. Multiple adjacent lymph nodes in the mesentery. Remaining small bowel loops are normal. Small volume of colonic stool. The appendix is normal. Vascular/Lymphatic: No retroperitoneal adenopathy. Abdominal aorta is normal in caliber. 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 A normal right IVC is demonstrated. Reproductive: Prostate gland normal in size. Bladder: Decompressed.  No pelvic free flow Other: No free air or perforation. Musculoskeletal: There are no acute or suspicious osseous abnormalities. IMPRESSION: 1. Inflammatory changes in the retroperitoneum adjacent to the duodenum and pancreatic head. The overall distribution is similar to that of prior CT, however the degree of inflammation has increased. Differential considerations include duodenitis including peptic ulcer disease versus pancreatitis, possible groove pancreatitis. 2. Hepatic steatosis again seen. Electronically Signed   By: MeJeb Levering.D.   On: 10/16/2015 22:34    ENDOSCOPIC STUDIES: Per HPI  IMPRESSION:   *  Acute pancreatitis. Etiology is hypertriglyceridemia.  Unable to calculate Ranson's score without LDH.  Lots of large volume emesis, so element of GOO.  Lipase improving. Although his BUN/creatinine and GFR are normal. He is certainly dry given the clinical situation and the rise in his hemoglobin. Suspect he had pancreatitis in 11/2013, but with normal LFTs, the Lipase was not checked.   *  Type 2, IDDM.  Uncontrolled  *  Hypertriglyceridemia. Patient ran out of TriCor at least 2 months ago. Suspect it was probably longer than that since he's been taking it.  *  Hepatic steatosis. Normal LFTs.    PLAN:     *  Place NGT to LIS.  Increase the IV normal saline from 120 to 250 mL/h.  Ice chips ok, otherwise keep NPO. I change the IV Protonix to once daily and discontinued the sucralfate.   Symptomatic mgt with antiemetics and anaelgesics.  Lipase in AM.     Azucena Freed  10/17/2015, 12:23 PM Pager: [phone]

## 2015-10-18 ENCOUNTER — Inpatient Hospital Stay (HOSPITAL_COMMUNITY): Payer: Self-pay

## 2015-10-18 DIAGNOSIS — E875 Hyperkalemia: Secondary | ICD-10-CM | POA: Diagnosis present

## 2015-10-18 DIAGNOSIS — R1013 Epigastric pain: Secondary | ICD-10-CM

## 2015-10-18 LAB — GLUCOSE, CAPILLARY
GLUCOSE-CAPILLARY: 216 mg/dL — AB (ref 65–99)
GLUCOSE-CAPILLARY: 206 mg/dL — AB (ref 65–99)
GLUCOSE-CAPILLARY: 164 mg/dL — AB (ref 65–99)
Glucose-Capillary: 219 mg/dL — ABNORMAL HIGH (ref 65–99)
Glucose-Capillary: 194 mg/dL — ABNORMAL HIGH (ref 65–99)
Glucose-Capillary: 229 mg/dL — ABNORMAL HIGH (ref 65–99)
Glucose-Capillary: 237 mg/dL — ABNORMAL HIGH (ref 65–99)
Glucose-Capillary: 205 mg/dL — ABNORMAL HIGH (ref 65–99)
Glucose-Capillary: 210 mg/dL — ABNORMAL HIGH (ref 65–99)
Glucose-Capillary: 163 mg/dL — ABNORMAL HIGH (ref 65–99)

## 2015-10-18 LAB — HEMOGLOBIN A1C
Hgb A1c MFr Bld: 12.4 % — ABNORMAL HIGH (ref 4.8–5.6)
MEAN PLASMA GLUCOSE: 309 mg/dL

## 2015-10-18 LAB — BASIC METABOLIC PANEL
ANION GAP: 14 (ref 5–15)
Anion gap: 11 (ref 5–15)
BUN: 7 mg/dL (ref 6–20)
BUN: 10 mg/dL (ref 6–20)
CALCIUM: 7.7 mg/dL — AB (ref 8.9–10.3)
CHLORIDE: 105 mmol/L (ref 101–111)
CHLORIDE: 101 mmol/L (ref 101–111)
CO2: 15 mmol/L — AB (ref 22–32)
CO2: 22 mmol/L (ref 22–32)
CREATININE: 0.76 mg/dL (ref 0.61–1.24)
Calcium: 8.2 mg/dL — ABNORMAL LOW (ref 8.9–10.3)
Creatinine, Ser: 1 mg/dL (ref 0.61–1.24)
GFR calc non Af Amer: >60 mL/min (ref >60)
Glucose, Bld: 226 mg/dL — ABNORMAL HIGH (ref 65–99)
Glucose, Bld: 185 mg/dL — ABNORMAL HIGH (ref 65–99)
POTASSIUM: 6.1 mmol/L — AB (ref 3.5–5.1)
POTASSIUM: 4 mmol/L (ref 3.5–5.1)
SODIUM: 134 mmol/L — AB (ref 135–145)
Sodium: 134 mmol/L — ABNORMAL LOW (ref 135–145)

## 2015-10-18 LAB — LACTIC ACID, PLASMA: Lactic Acid, Venous: 1.2 mmol/L (ref 0.5–2.0)

## 2015-10-18 LAB — LIPASE, BLOOD: Lipase: 48 U/L (ref 11–51)

## 2015-10-18 LAB — LIPID PANEL
CHOLESTEROL: 322 mg/dL — AB (ref 0–200)
LDL Cholesterol: UNDETERMINED mg/dL (ref 0–99)
Triglycerides: 1509 mg/dL — ABNORMAL HIGH (ref <150)
VLDL: UNDETERMINED mg/dL (ref 0–40)

## 2015-10-18 MED ORDER — DEXTROSE-NACL 5-0.45 % IV SOLN
INTRAVENOUS | Status: DC
  Administered 2015-10-18 – 2015-10-19 (×2): via INTRAVENOUS

## 2015-10-18 MED ORDER — SODIUM CHLORIDE 0.9 % IV SOLN: INTRAVENOUS | Status: DC

## 2015-10-18 MED ORDER — ACETAMINOPHEN 650 MG RE SUPP: 650.0000 mg | Freq: Four times a day (QID) | RECTAL | Status: DC | PRN

## 2015-10-18 MED ORDER — ACETAMINOPHEN 325 MG PO TABS
650.0000 mg | ORAL_TABLET | Freq: Four times a day (QID) | ORAL | Status: DC | PRN
Start: 2015-10-18 — End: 2015-10-26
  Administered 2015-10-18 – 2015-10-25 (×8): 650 mg via ORAL
  Filled 2015-10-18 (×8): qty 2

## 2015-10-18 MED ORDER — SODIUM CHLORIDE 0.9 % IV SOLN
INTRAVENOUS | Status: DC
  Administered 2015-10-18: 1.7 [IU]/h via INTRAVENOUS
  Filled 2015-10-18 (×2): qty 2.5

## 2015-10-18 NOTE — Progress Notes (Signed)
Inpatient Diabetes Program Recommendations  AACE/ADA: New Consensus Statement on Inpatient Glycemic Control (2015)  Target Ranges:  Prepandial:   less than 140 mg/dL      Peak postprandial:   less than 180 mg/dL (1-2 hours)      Critically ill patients:  140 - 180 mg/dL   Review of Glycemic Control  Diabetes history: DM2 Outpatient Diabetes medications: Lantus 45 units QHS, Novolog 3-10 units tidwc, metformin 1000 mg bid Current orders for Inpatient glycemic control: Restarted GlucoStabilizer this am  Spoke with pt regarding his diabetes, and asked why he wasn't taking his insulin. "I thought taking insulin was a joke." Pt states he was not taking his insulin on a regular basis, even though he has no problems getting it. [facility] supplies his insulin. When asked if he was depressed, he answered no. States he knows what to do, just lays around and doesn't do it. GF states everyone in the family has been encouraging him to take care of himself and "he won't listen." Has missed appts in the past at [facility]. Does not have one at this point.  Inpatient Diabetes Program Recommendations:    When criteria met for transitioning to SQ, give Lantus 40 units Q24H. Novolog resistant tidwc and hs Novolog 6 units tidwc for meal coverage insulin.  Will follow. Thank you. Lorenda Peck, RD, LDN, CDE Inpatient Diabetes Coordinator [phone]

## 2015-10-18 NOTE — Progress Notes (Signed)
[facility] Progress Note  Subjective:  Pulled out NGT and refused re-insertion, but did vomit again this AM.  Says that pain is now about a 7 and was over 10 when he came in.  No flatus.  Objective:  Vital signs in last 24 hours: Temp:  [99 F (37.2 C)-100.2 F (37.9 C)] 99.3 F (37.4 C) (01/13 0510) Pulse Rate:  [115-129] 115 (01/13 0510) Resp:  [18-20] 18 (01/13 0510) BP: (145-157)/(82-92) 157/82 mmHg (01/13 0510) SpO2:  [98 %-99 %] 98 % (01/13 0510) Weight:  [270 lb (122.471 kg)] 270 lb (122.471 kg) (01/12 1130) Last BM Date: 10/16/15 General:  Alert, Well-developed, appears somewhat uncomfortable. Heart:  Tachy.  No murmurs. Pulm:  CTAB.  No W/R/R. Abdomen:  Soft, non-distended.  BS present but very quiet and hypoactive.  Diffuse TTP but > in the epigastrium. Extremities:  Without edema. Neurologic:  Alert and oriented x 4;  grossly normal neurologically. Psych:  Alert and cooperative. Normal mood and affect.  Intake/Output from previous day: 01/12 0701 - 01/13 0700 In: 4203.5 [I.V.:4203.5] Out: 1400 [Urine:300; Emesis/NG output:1100] Intake/Output this shift:    Lab Results:  Recent Labs  10/16/15 1216 10/16/15 1508  WBC 8.0 10.6*  HGB 14.9 18.2*  HCT 41.0 46.7  PLT 242 330   BMET  Recent Labs  10/17/15 0005 10/17/15 0337 10/17/15 0647  NA 140 139 141  K 4.7 4.5 4.1  CL 104 105 106  CO2 16* 19* 21*  GLUCOSE 364* 324* 281*  BUN 6 8 7   CREATININE 1.08 0.92 0.96  CALCIUM 9.0 8.8* 8.7*   LFT  Recent Labs  10/16/15 1508  PROT 8.1  ALBUMIN 4.9  AST 21  ALT 27  ALKPHOS 68  BILITOT 1.1   PT/INR  Recent Labs  10/17/15 0005  LABPROT 13.6  INR 1.02   Hepatitis Panel No results for input(s): HEPBSAG, HCVAB, HEPAIGM, HEPBIGM in the last 72 hours.  US Abdomen Complete  10/17/2015  CLINICAL DATA:  Initial evaluation for acute pancreatitis. EXAM: ABDOMEN ULTRASOUND COMPLETE COMPARISON:  Prior CT from 10/16/2015. FINDINGS:  Gallbladder: No gallstones or wall thickening visualized. No sonographic Murphy sign noted by sonographer. Common bile duct: Diameter: 2.8 mm Liver: No focal lesion identified. Within normal limits in parenchymal echogenicity. IVC: No abnormality visualized. Pancreas: Not well visualized, with known inflammatory changes not well seen. Probable duodenal wall thickening noted, similar to previous CT. Spleen: Size and appearance within normal limits. Right Kidney: Length: 12.8 cm. Echogenicity within normal limits. No mass or hydronephrosis visualized. Left Kidney: Length: 14.0 cm. Echogenicity within normal limits. No mass or hydronephrosis visualized. Abdominal aorta: No aneurysm visualized. Other findings: None. IMPRESSION: 1. Poor visualization of known inflammatory changes within the pancreas, although duodenal wall thickening appears grossly similar relative to recent CT. 2. Otherwise normal abdominal ultrasound. No cholelithiasis, choledocholithiasis, or biliary dilatation. Electronically Signed   By: Rise Mu M.D.   On: 10/17/2015 01:00   Ct Abdomen Pelvis W Contrast  10/16/2015  CLINICAL DATA:  Awoke today with severe mid abdominal pain, nausea and vomiting. EXAM: CT ABDOMEN AND PELVIS WITH CONTRAST TECHNIQUE: Multidetector CT imaging of the abdomen and pelvis was performed using the standard protocol following bolus administration of intravenous contrast. CONTRAST:  25mL OMNIPAQUE IOHEXOL 300 MG/ML SOLN, OMNIPAQUE IOHEXOL 300 MG/ML SOLN COMPARISON:  CT 11/18/2013 FINDINGS: Lower chest:  Mild dependent atelectasis in the right lower lobe. Liver: Prominent size. Diffusely decreased hepatic density consistent with steatosis. No focal  lesion. Hepatobiliary: Gallbladder physiologically distended, no calcified stone. No biliary dilatation. Pancreas: Marked soft tissue stranding about the head of the pancreas and in the retroperitoneum. No ductal dilatation. Spleen: Normal in size. Adrenal  glands: No nodule. Kidneys: Symmetric renal enhancement. No hydronephrosis. No perinephric stranding. Stomach/Bowel: The stomach is decompressed. There is marked inflammatory change about the duodenum with associated wall thickening. Diffuse soft tissue stranding is seen. Small amount of fluid is scan tracking anterior to the left perirenal fascia. No extraluminal air. Multiple adjacent lymph nodes in the mesentery. Remaining small bowel loops are normal. Small volume of colonic stool. The appendix is normal. Vascular/Lymphatic: No retroperitoneal adenopathy. Abdominal aorta is normal in caliber. 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 A normal right IVC is demonstrated. Reproductive: Prostate gland normal in size. Bladder: Decompressed.  No pelvic free flow Other: No free air or perforation. Musculoskeletal: There are no acute or suspicious osseous abnormalities. IMPRESSION: 1. Inflammatory changes in the retroperitoneum adjacent to the duodenum and pancreatic head. The overall distribution is similar to that of prior CT, however the degree of inflammation has increased. Differential considerations include duodenitis including peptic ulcer disease versus pancreatitis, possible groove pancreatitis. 2. Hepatic steatosis again seen. Electronically Signed   By: Rubye OaksMelanie  Ehinger M.D.   On: 10/16/2015 22:34    Assessment / Plan: *Acute pancreatitis:  Etiology is hypertriglyceridemia.  Lots of large volume emesis, so element of GOO due to duodenal inflammation from pancreatitis/ileus. Lipase has returned to normal.  Triglycerides are trending down, but still 1500.  Continue aggressive IVF's, pain control, antiemetics, ice chips only.  If continues vomiting then will need NGT again.  Discussed with Dr. Rhea BeltonPyrtle and Dr. Gonzella Lexhungel, will restart insulin gtt with monitoring of glucose frequently and rechecking triglycerides every 12-24 hours until they reach  500.  *Type 2, IDDM, uncontrolled.  *Hypertriglyceridemia:  Patient ran out of Tricor at least 2 months ago. Suspect it was probably longer than that since he's been taking it.  *Hepatic steatosis:  Normal LFTs.    LOS: 2 days   ZEHR, JESSICA D.  10/18/2015, 10:46 AM  Pager number [phone]

## 2015-10-18 NOTE — Progress Notes (Addendum)
[facility] PROGRESS NOTE  Brent Costa ZOX:[id number] DOB: 05-09-1991 DOA: 10/16/2015 PCP: [name], [name], MD  Brief narrative 25 year old obese male with uncontrolled diabetes mellitus on insulin ( last A1c >10, non-adherent to diet and non-compliant with his insulin), hypertriglyceridemia (noncompliant with medication), hypertension, GERD, molluscum contagiosum, ongoing tobacco and alcohol use who presented to the ED with nausea, vomiting and abdominal pain since the day of admission. Patient reports vomiting more than 10 times mainly including food particle and without any blood. Denied any fevers, chills, shortness of breath, dysuria or diarrhea. He had epigastric pain 10/10 in severity nonradiating without any aggravating or relieving factors. Reports drinking 2 beers daily for the past 3 days. In the ED his lipase was elevated to 595, had significantly high triglycerides >1000, blood glucose in 400s with anion gap. Patient admitted for DKA and started on insulin drip. Also being managed for acute pancreatitis. A CT of his abdomen and pelvis showed inflammatory changes in the retroperitoneum adjacent to the duodenum and pancreatic head along with fatty liver.   Assessment/Plan: SIRS secondary to acute pancreatitis and DKA -Acute pancreatitis likely contributed by severe hypertriglyceridemia and ongoing alcohol use. Insulin drip discontinued after and then gap closed following admission. CT and ultrasound abdomen shows no biliary abnormality. LFTs are normal. -Received aggressive IV fluids. Pain control with when necessary morphine and antiemetics. Continue nothing by mouth. -Markedly elevated triglycerides noted. ( 3100 )  following admission and slowly trending down . Patient not taking fibrate. (Levels were >900 when checked recently as outpatient) . -NG tube placed as per GI recommendation for decompression but   patient pulled it out. Discussed with GI. Given persistent  abdominal pain and still significantly elevated triglycerides recommend to resume insulin drip and monitor his triglycerides daily. Lipase has normalized. GI recommends patient does not need apheresis given clinical improvement. -Transferred to telemetry while on insulin drip. Monitor fsg per protocol while on insulin drip.  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 Resumed fibrate. Slowly trending down. Continue insulin drip until levels less than 500. Appreciate GI consult.  Mild DKA Given 2 L IV normal saline bolus on admission and glucose stabilizer protocol initiated on admission. Now resolved. Resumed IV insulin given pancreatitis with high triglyceride level.  Patient noncompliant with his diet and medications.  Essential hypertension Resume home medications   Tobacco use Counseled on cessation.   Molluscum contagiosum Was treated with fortified in by PCP but still has itching. Added hydroxyzine and triamcinolone cream  Hyperkalemia with metabolic acidosis Monitor on telemetry. Will repeat BMET afternoon since patient is on insulin drip. D/c ACEi  DVT prophylaxis: Subcutaneous Lovenox Diet: Nothing by mouth  Code Status: full code Family Communication: Friend at bedside Disposition Plan: Currently inpatient   Consultants:  lebeaur  GI  Procedures:  CT abdomen  Ultrasound abdomen  Antibiotics:  None  HPI/Subjective: Patient seen and examined. Still has epigastric pain and nausea. Denies vomiting.  Objective: Filed Vitals:   10/18/15 1030 10/18/15 1351  BP: 128/75 139/69  Pulse: 120 117  Temp: 98.5 F (36.9 C) 98.9 F (37.2 C)  Resp: 18 16    Intake/Output Summary (Last 24 hours) at 10/18/15 1545 Last data filed at 10/18/15 1351  Gross per 24 hour  Intake 3832.67 ml  Output    825 ml  Net 3007.67 ml   Filed Weights   10/17/15 1130  Weight: 122.471 kg (270 lb)    Exam:   General:  Young obese male in some distress with pain  HEENT: dry  mucosa, no icterus  Chest: Clear bilaterally, no added sounds  CVS: S1 and S2 tachycardic,, no murmurs or gallop  GI: Soft, nondistended, epigastric tenderness to pressure, bowel sounds present  Musculoskeletal: Warm, diffuse molluscum contagiosum rashes.  Data Reviewed: Basic Metabolic Panel:  Recent Labs Lab 10/16/15 1508 10/17/15 0005 10/17/15 0337 10/17/15 0647 10/18/15 0820  NA 136 140 139 141 134*  K 4.7 4.7 4.5 4.1 6.1*  CL 96* 104 105 106 105  CO2 22 16* 19* 21* 15*  GLUCOSE 394* 364* 324* 281* 226*  BUN 12 6 8 7 7   CREATININE 0.96 1.08 0.92 0.96 1.00  CALCIUM 10.2 9.0 8.8* 8.7* 7.7*   Liver Function Tests:  Recent Labs Lab 10/16/15 1508  AST 21  ALT 27  ALKPHOS 68  BILITOT 1.1  PROT 8.1  ALBUMIN 4.9    Recent Labs Lab 10/16/15 1508 10/18/15 0439  LIPASE 595* 48   No results for input(s): AMMONIA in the last 168 hours. CBC:  Recent Labs Lab 10/16/15 1216 10/16/15 1508  WBC 8.0 10.6*  NEUTROABS  --  7.4  HGB 14.9 18.2*  HCT 41.0 46.7  MCV 79.2 81.9  PLT 242 330   Cardiac Enzymes:  Recent Labs Lab 10/17/15 0005 10/17/15 0647 10/17/15 1400  TROPONINI <0.03 <0.03 <0.03   BNP (last 3 results) No results for input(s): BNP in the last 8760 hours.  ProBNP (last 3 results) No results for input(s): PROBNP in the last 8760 hours.  CBG:  Recent Labs Lab 10/17/15 2039 10/17/15 2353 10/18/15 0528 10/18/15 0812 10/18/15 1213  GLUCAP 247* 242* 219* 216* 194*    Recent Results (from the past 240 hour(s))  Culture, blood (x 2)     Status: None (Preliminary result)   Collection Time: 10/17/15 12:10 AM  Result Value Ref Range Status   Specimen Description BLOOD RIGHT ARM  Final   Special Requests IN PEDIATRIC BOTTLE  Final   Culture   Final    NO GROWTH 1 DAY Performed at [facility]    Report Status PENDING  Incomplete  Culture, blood (x 2)     Status: None (Preliminary result)   Collection Time: 10/17/15  2:05 AM   Result Value Ref Range Status   Specimen Description BLOOD BLOOD LEFT HAND  Final   Special Requests BOTTLES DRAWN AEROBIC ONLY  Final   Culture   Final    NO GROWTH 1 DAY Performed at [facility]    Report Status PENDING  Incomplete     Studies: US Abdomen Complete  10/17/2015  CLINICAL DATA:  Initial evaluation for acute pancreatitis. EXAM: ABDOMEN ULTRASOUND COMPLETE COMPARISON:  Prior CT from 10/16/2015. FINDINGS: Gallbladder: No gallstones or wall thickening visualized. No sonographic Murphy sign noted by sonographer. Common bile duct: Diameter: 2.8 mm Liver: No focal lesion identified. Within normal limits in parenchymal echogenicity. IVC: No abnormality visualized. Pancreas: Not well visualized, with known inflammatory changes not well seen. Probable duodenal wall thickening noted, similar to previous CT. Spleen: Size and appearance within normal limits. Right Kidney: Length: 12.8 cm. Echogenicity within normal limits. No mass or hydronephrosis visualized. Left Kidney: Length: 14.0 cm. Echogenicity within normal limits. No mass or hydronephrosis visualized. Abdominal aorta: No aneurysm visualized. Other findings: None. IMPRESSION: 1. Poor visualization of known inflammatory changes within the pancreas, although duodenal wall thickening appears grossly similar relative to recent CT. 2. Otherwise normal abdominal ultrasound. No cholelithiasis, choledocholithiasis, or biliary dilatation. Electronically Signed  By: Rise Mu M.D.   On: 10/17/2015 01:00   Ct Abdomen Pelvis W Contrast  10/16/2015  CLINICAL DATA:  Awoke today with severe mid abdominal pain, nausea and vomiting. EXAM: CT ABDOMEN AND PELVIS WITH CONTRAST TECHNIQUE: Multidetector CT imaging of the abdomen and pelvis was performed using the standard protocol following bolus administration of intravenous contrast. CONTRAST:  25mL OMNIPAQUE IOHEXOL 300 MG/ML SOLN, OMNIPAQUE IOHEXOL 300 MG/ML SOLN COMPARISON:   CT 11/18/2013 FINDINGS: Lower chest:  Mild dependent atelectasis in the right lower lobe. Liver: Prominent size. Diffusely decreased hepatic density consistent with steatosis. No focal lesion. Hepatobiliary: Gallbladder physiologically distended, no calcified stone. No biliary dilatation. Pancreas: Marked soft tissue stranding about the head of the pancreas and in the retroperitoneum. No ductal dilatation. Spleen: Normal in size. Adrenal glands: No nodule. Kidneys: Symmetric renal enhancement. No hydronephrosis. No perinephric stranding. Stomach/Bowel: The stomach is decompressed. There is marked inflammatory change about the duodenum with associated wall thickening. Diffuse soft tissue stranding is seen. Small amount of fluid is scan tracking anterior to the left perirenal fascia. No extraluminal air. Multiple adjacent lymph nodes in the mesentery. Remaining small bowel loops are normal. Small volume of colonic stool. The appendix is normal. Vascular/Lymphatic: No retroperitoneal adenopathy. Abdominal aorta is normal in caliber. 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 A normal right IVC is demonstrated. Reproductive: Prostate gland normal in size. Bladder: Decompressed.  No pelvic free flow Other: No free air or perforation. Musculoskeletal: There are no acute or suspicious osseous abnormalities. IMPRESSION: 1. Inflammatory changes in the retroperitoneum adjacent to the duodenum and pancreatic head. The overall distribution is similar to that of prior CT, however the degree of inflammation has increased. Differential considerations include duodenitis including peptic ulcer disease versus pancreatitis, possible groove pancreatitis. 2. Hepatic steatosis again seen. Electronically Signed   By: Rubye Oaks M.D.   On: 10/16/2015 22:34    Scheduled Meds: . enoxaparin (LOVENOX) injection  40 mg Subcutaneous Daily  . lisinopril  10 mg Oral Daily  .  omega-3 acid ethyl esters  1 g Oral BID  . pantoprazole (PROTONIX) IV  40 mg Intravenous Q24H  . triamcinolone cream  1 application Topical BID   Continuous Infusions: . sodium chloride 250 mL/hr at 10/18/15 0742  . sodium chloride    . dextrose 5 % and 0.45% NaCl 75 mL/hr at 10/18/15 1238  . insulin (NOVOLIN-R) infusion       Time spent: 25 minutes    Carlise Stofer  [facility] Pager [phone] If 7PM-7AM, please contact night-coverage at [email or web address], password [facility] 10/18/2015, 3:45 PM  LOS: 2 days

## 2015-10-19 DIAGNOSIS — R6510 Systemic inflammatory response syndrome (SIRS) of non-infectious origin without acute organ dysfunction: Secondary | ICD-10-CM

## 2015-10-19 DIAGNOSIS — E119 Type 2 diabetes mellitus without complications: Secondary | ICD-10-CM

## 2015-10-19 LAB — BASIC METABOLIC PANEL
Anion gap: 12 (ref 5–15)
Anion gap: 13 (ref 5–15)
Anion gap: 14 (ref 5–15)
BUN: 11 mg/dL (ref 6–20)
BUN: 10 mg/dL (ref 6–20)
BUN: 12 mg/dL (ref 6–20)
CALCIUM: 8.2 mg/dL — AB (ref 8.9–10.3)
CALCIUM: 8.3 mg/dL — AB (ref 8.9–10.3)
CALCIUM: 8.7 mg/dL — AB (ref 8.9–10.3)
CO2: 21 mmol/L — ABNORMAL LOW (ref 22–32)
CO2: 21 mmol/L — ABNORMAL LOW (ref 22–32)
CO2: 20 mmol/L — ABNORMAL LOW (ref 22–32)
CREATININE: 0.71 mg/dL (ref 0.61–1.24)
CREATININE: 0.77 mg/dL (ref 0.61–1.24)
CREATININE: 0.82 mg/dL (ref 0.61–1.24)
Chloride: 103 mmol/L (ref 101–111)
Chloride: 101 mmol/L (ref 101–111)
Chloride: 100 mmol/L — ABNORMAL LOW (ref 101–111)
GFR calc Af Amer: >60 mL/min (ref >60)
GFR calc Af Amer: >60 mL/min (ref >60)
GFR calc Af Amer: >60 mL/min (ref >60)
GLUCOSE: 167 mg/dL — AB (ref 65–99)
GLUCOSE: 183 mg/dL — AB (ref 65–99)
GLUCOSE: 200 mg/dL — AB (ref 65–99)
POTASSIUM: 3.7 mmol/L (ref 3.5–5.1)
POTASSIUM: 3.7 mmol/L (ref 3.5–5.1)
Potassium: 3.9 mmol/L (ref 3.5–5.1)
SODIUM: 135 mmol/L (ref 135–145)
SODIUM: 134 mmol/L — AB (ref 135–145)
SODIUM: 136 mmol/L (ref 135–145)

## 2015-10-19 LAB — URINE MICROSCOPIC-ADD ON
SQUAMOUS EPITHELIAL / LPF: NONE SEEN
WBC, UA: NONE SEEN WBC/hpf (ref 0–5)

## 2015-10-19 LAB — URINALYSIS, ROUTINE W REFLEX MICROSCOPIC
Glucose, UA: >1000 mg/dL — AB
KETONES UR: 40 mg/dL — AB
LEUKOCYTES UA: NEGATIVE
NITRITE: NEGATIVE
PH: 6 (ref 5.0–8.0)
PROTEIN: 100 mg/dL — AB
Specific Gravity, Urine: >1.046 — ABNORMAL HIGH (ref 1.005–1.030)

## 2015-10-19 LAB — GLUCOSE, CAPILLARY
GLUCOSE-CAPILLARY: 169 mg/dL — AB (ref 65–99)
GLUCOSE-CAPILLARY: 154 mg/dL — AB (ref 65–99)
GLUCOSE-CAPILLARY: 188 mg/dL — AB (ref 65–99)
GLUCOSE-CAPILLARY: 179 mg/dL — AB (ref 65–99)
GLUCOSE-CAPILLARY: 179 mg/dL — AB (ref 65–99)
GLUCOSE-CAPILLARY: 161 mg/dL — AB (ref 65–99)
GLUCOSE-CAPILLARY: 158 mg/dL — AB (ref 65–99)
GLUCOSE-CAPILLARY: 146 mg/dL — AB (ref 65–99)
GLUCOSE-CAPILLARY: 147 mg/dL — AB (ref 65–99)
Glucose-Capillary: 156 mg/dL — ABNORMAL HIGH (ref 65–99)
Glucose-Capillary: 169 mg/dL — ABNORMAL HIGH (ref 65–99)
Glucose-Capillary: 167 mg/dL — ABNORMAL HIGH (ref 65–99)
Glucose-Capillary: 168 mg/dL — ABNORMAL HIGH (ref 65–99)
Glucose-Capillary: 173 mg/dL — ABNORMAL HIGH (ref 65–99)
Glucose-Capillary: 196 mg/dL — ABNORMAL HIGH (ref 65–99)
Glucose-Capillary: 196 mg/dL — ABNORMAL HIGH (ref 65–99)

## 2015-10-19 LAB — LACTIC ACID, PLASMA: LACTIC ACID, VENOUS: 0.9 mmol/L (ref 0.5–2.0)

## 2015-10-19 LAB — LIPID PANEL
Cholesterol: 258 mg/dL — ABNORMAL HIGH (ref 0–200)
HDL: 15 mg/dL — AB (ref >40)
LDL CALC: UNDETERMINED mg/dL (ref 0–99)
Total CHOL/HDL Ratio: 17.2 RATIO
Triglycerides: 721 mg/dL — ABNORMAL HIGH (ref <150)
VLDL: UNDETERMINED mg/dL (ref 0–40)

## 2015-10-19 MED ORDER — SODIUM CHLORIDE 0.9 % IV SOLN
INTRAVENOUS | Status: AC
  Administered 2015-10-19: 11:00:00 via INTRAVENOUS

## 2015-10-19 MED ORDER — SODIUM CHLORIDE 0.9 % IV SOLN: INTRAVENOUS | Status: DC

## 2015-10-19 MED ORDER — DEXTROSE-NACL 5-0.45 % IV SOLN
INTRAVENOUS | Status: DC
  Administered 2015-10-19 – 2015-10-21 (×5): via INTRAVENOUS

## 2015-10-19 MED ORDER — INSULIN GLARGINE 100 UNIT/ML ~~LOC~~ SOLN
10.0000 [IU] | Freq: Every day | SUBCUTANEOUS | Status: DC
  Administered 2015-10-19: 10 [IU] via SUBCUTANEOUS
  Filled 2015-10-19: qty 0.1

## 2015-10-19 MED ORDER — SODIUM CHLORIDE 0.9 % IV SOLN
INTRAVENOUS | Status: DC
  Administered 2015-10-19: 03:00:00 via INTRAVENOUS

## 2015-10-19 MED ORDER — SODIUM CHLORIDE 0.9 % IV SOLN
INTRAVENOUS | Status: DC
  Administered 2015-10-19: 13:00:00 via INTRAVENOUS
  Administered 2015-10-19: 1.2 [IU]/h via INTRAVENOUS
  Filled 2015-10-19 (×3): qty 2.5

## 2015-10-19 MED ORDER — INSULIN ASPART 100 UNIT/ML ~~LOC~~ SOLN
0.0000 [IU] | SUBCUTANEOUS | Status: DC
  Administered 2015-10-19 (×3): 3 [IU] via SUBCUTANEOUS

## 2015-10-19 NOTE — Progress Notes (Signed)
[facility] PROGRESS NOTE  Brent Costa [id number] DOB: August 26, 1991 DOA: 10/16/2015 PCP: [name], [name], MD  Brief narrative 25 year old obese male with uncontrolled diabetes mellitus on insulin ( last A1c >10, non-adherent to diet and non-compliant with his insulin), hypertriglyceridemia (noncompliant with medication), hypertension, GERD, molluscum contagiosum, ongoing tobacco and alcohol use who presented to the ED with nausea, vomiting and abdominal pain since the day of admission. Patient reports vomiting more than 10 times mainly including food particle and without any blood. Denied any fevers, chills, shortness of breath, dysuria or diarrhea. He had epigastric pain 10/10 in severity nonradiating without any aggravating or relieving factors. Reports drinking 2 beers daily for the past 3 days. In the ED his lipase was elevated to 595, had significantly high triglycerides >1000, blood glucose in 400s with anion gap. Patient admitted for DKA and started on insulin drip. Also being managed for acute pancreatitis. A CT of his abdomen and pelvis showed inflammatory changes in the retroperitoneum adjacent to the duodenum and pancreatic head along with fatty liver.   Assessment/Plan: SIRS secondary to acute pancreatitis and DKA -Acute pancreatitis likely contributed by severe hypertriglyceridemia and ongoing alcohol use.   CT and ultrasound abdomen shows no biliary abnormality. LFTs are normal. -Received aggressive IV fluids. Pain control with when necessary morphine and antiemetics. Continue nothing by mouth. -Markedly elevated triglycerides on admission. ( 3100 ) now slowly trending down (750 today). Patient not taking fibrate. (Levels were >900 when checked recently as outpatient) . -Given significantly elevated triglycerides, GI recommend to resume insulin drip and monitor his triglycerides daily. Lipase has normalized.  -Insulin drip was discontinued last night for reason  unclear. I have restarted it.   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 Resumed fibrate. Slowly trending down. Continue insulin drip until levels less than 500. Appreciate GI consult.  Mild DKA Resolved. Patient noncompliant to medications non-adherent to diet.. A1c of 12.4  Fever with tachycardia on 1/13 Patient febrile to 101.30F and tachycardic to 120s. Blood cultures, UA sent. Chest x-ray unremarkable. Will monitor without antibiotics.   Essential hypertension Resume home medications   Tobacco use Counseled on cessation.   Molluscum contagiosum Was treated with fortified in by PCP but still has itching. Added hydroxyzine and triamcinolone cream  Hyperkalemia with metabolic acidosis Resolved with hydration. Holding ACE inhibitor.  DVT prophylaxis: Subcutaneous Lovenox Diet: Nothing by mouth  Code Status: full code Family Communication: Friend at bedside Disposition Plan: Currently inpatient   Consultants:  lebeaur  GI  Procedures:  CT abdomen  Ultrasound abdomen  Antibiotics:  None  HPI/Subjective: Patient seen and examined. Was febrile overnight and tachycardic. Cultures sent. Still has epigastric discomfort but improving.  Objective: Filed Vitals:   10/19/15 0413 10/19/15 1035  BP: 157/70 140/64  Pulse: 106 92  Temp: 98.2 F (36.8 C) 99 F (37.2 C)  Resp: 18 20    Intake/Output Summary (Last 24 hours) at 10/19/15 1227 Last data filed at 10/19/15 1038  Gross per 24 hour  Intake 1196.21 ml  Output    875 ml  Net 321.21 ml   Filed Weights   10/17/15 1130 10/18/15 1625  Weight: 122.471 kg (270 lb) 121.11 kg (267 lb)    Exam:   General:  Young obese male , not in distress  HEENT: dry mucosa,   Chest: Clear bilaterally, no added sounds  CVS: S1 and S2 tachycardic,, no murmurs or gallop  GI: Soft, nondistended, epigastric tenderness to pressure, bowel sounds present  Musculoskeletal: Warm, diffuse molluscum contagiosum  rashes.  Data Reviewed: Basic Metabolic Panel:  Recent Labs Lab 10/18/15 0820 10/18/15 2213 10/19/15 0422 10/19/15 0733 10/19/15 2355  NA 134* 134* 135 134* 136  K 6.1* 4.0 3.7 3.9 3.7  CL 105 101 101 100* 103  CO2 15* 22 21* 20* 21*  GLUCOSE 226* 185* 167* 200* 183*  BUN 7 10 11 12 10   CREATININE 1.00 0.76 0.71 0.77 0.82  CALCIUM 7.7* 8.2* 8.2* 8.3* 8.7*   Liver Function Tests:  Recent Labs Lab 10/16/15 1508  AST 21  ALT 27  ALKPHOS 68  BILITOT 1.1  PROT 8.1  ALBUMIN 4.9    Recent Labs Lab 10/16/15 1508 10/18/15 0439  LIPASE 595* 48   No results for input(s): AMMONIA in the last 168 hours. CBC:  Recent Labs Lab 10/16/15 1216 10/16/15 1508  WBC 8.0 10.6*  NEUTROABS  --  7.4  HGB 14.9 18.2*  HCT 41.0 46.7  MCV 79.2 81.9  PLT 242 330   Cardiac Enzymes:  Recent Labs Lab 10/17/15 0005 10/17/15 0647 10/17/15 1400  TROPONINI <0.03 <0.03 <0.03   BNP (last 3 results) No results for input(s): BNP in the last 8760 hours.  ProBNP (last 3 results) No results for input(s): PROBNP in the last 8760 hours.  CBG:  Recent Labs Lab 10/19/15 0025 10/19/15 0133 10/19/15 0252 10/19/15 0410 10/19/15 0717  GLUCAP 156* 169* 169* 154* 188*    Recent Results (from the past 240 hour(s))  Culture, blood (x 2)     Status: None (Preliminary result)   Collection Time: 10/17/15 12:10 AM  Result Value Ref Range Status   Specimen Description BLOOD RIGHT ARM  Final   Special Requests IN PEDIATRIC BOTTLE  Final   Culture   Final    NO GROWTH 1 DAY Performed at [facility]    Report Status PENDING  Incomplete  Culture, blood (x 2)     Status: None (Preliminary result)   Collection Time: 10/17/15  2:05 AM  Result Value Ref Range Status   Specimen Description BLOOD BLOOD LEFT HAND  Final   Special Requests BOTTLES DRAWN AEROBIC ONLY  Final   Culture   Final    NO GROWTH 2 DAYS Performed at [facility]    Report Status PENDING   Incomplete     Studies: Dg Chest Port 1 View  10/18/2015  CLINICAL DATA:  25 year old male with fever and weakness EXAM: PORTABLE CHEST 1 VIEW COMPARISON:  Radiograph dated 12/22/2013 FINDINGS: Single portable view of the chest does not demonstrate a focal consolidation. There is no pleural effusion or pneumothorax. The cardiac silhouette is within normal limits. The osseous structures appear unremarkable. IMPRESSION: No active disease. Electronically Signed   By: Elgie Collard M.D.   On: 10/18/2015 21:39    Scheduled Meds: . enoxaparin (LOVENOX) injection  40 mg Subcutaneous Daily  . omega-3 acid ethyl esters  1 g Oral BID  . pantoprazole (PROTONIX) IV  40 mg Intravenous Q24H  . triamcinolone cream  1 application Topical BID   Continuous Infusions: . sodium chloride 999 mL/hr at 10/19/15 1116  . sodium chloride    . dextrose 5 % and 0.45% NaCl    . insulin (NOVOLIN-R) infusion 1.2 Units/hr (10/19/15 1140)     Time spent: 25 minutes    Anastashia Westerfeld  [facility] Pager [phone] If 7PM-7AM, please contact night-coverage at [email or web address], password [facility] 10/19/2015, 12:27 PM  LOS: 3 days

## 2015-10-19 NOTE — Progress Notes (Signed)
Subjective: Still with upper ABM pain.  Mildly improved.  Objective: Vital signs in last 24 hours: Temp:  [98.1 F (36.7 C)-101.6 F (38.7 C)] 98.2 F (36.8 C) (01/14 0413) Pulse Rate:  [106-128] 106 (01/14 0413) Resp:  [16-24] 18 (01/14 0413) BP: (128-157)/(65-75) 157/70 mmHg (01/14 0413) SpO2:  [97 %-98 %] 97 % (01/14 0413) Weight:  [121.11 kg (267 lb)] 121.11 kg (267 lb) (01/13 1625) Last BM Date: 10/16/15  Intake/Output from previous day: 01/13 0701 - 01/14 0700 In: 1196.2 [P.O.:60; I.V.:1136.2] Out: 525 [Urine:525] Intake/Output this shift:    General appearance: sleepy GI: tender in the epigastric region  Lab Results:  Recent Labs  10/16/15 1216 10/16/15 1508  WBC 8.0 10.6*  HGB 14.9 18.2*  HCT 41.0 46.7  PLT 242 330   BMET  Recent Labs  10/18/15 2213 10/19/15 0422 10/19/15 2355  NA 134* 135 136  K 4.0 3.7 3.7  CL 101 101 103  CO2 22 21* 21*  GLUCOSE 185* 167* 183*  BUN 10 11 10   CREATININE 0.76 0.71 0.82  CALCIUM 8.2* 8.2* 8.7*   LFT  Recent Labs  10/16/15 1508  PROT 8.1  ALBUMIN 4.9  AST 21  ALT 27  ALKPHOS 68  BILITOT 1.1   PT/INR  Recent Labs  10/17/15 0005  LABPROT 13.6  INR 1.02   Hepatitis Panel No results for input(s): HEPBSAG, HCVAB, HEPAIGM, HEPBIGM in the last 72 hours. C-Diff No results for input(s): CDIFFTOX in the last 72 hours. Fecal Lactopherrin No results for input(s): FECLLACTOFRN in the last 72 hours.  Studies/Results: Dg Chest Port 1 View  10/18/2015  CLINICAL DATA:  25 year old male with fever and weakness EXAM: PORTABLE CHEST 1 VIEW COMPARISON:  Radiograph dated 12/22/2013 FINDINGS: Single portable view of the chest does not demonstrate a focal consolidation. There is no pleural effusion or pneumothorax. The cardiac silhouette is within normal limits. The osseous structures appear unremarkable. IMPRESSION: No active disease. Electronically Signed   By: Elgie CollardArash  Radparvar M.D.   On: 10/18/2015 21:39     Medications:  Scheduled: . enoxaparin (LOVENOX) injection  40 mg Subcutaneous Daily  . insulin aspart  0-15 Units Subcutaneous 6 times per day  . insulin glargine  10 Units Subcutaneous QHS  . omega-3 acid ethyl esters  1 g Oral BID  . pantoprazole (PROTONIX) IV  40 mg Intravenous Q24H  . triamcinolone cream  1 application Topical BID   Continuous: . sodium chloride 75 mL/hr at 10/19/15 0253    Assessment/Plan: 1) Acute pancreatitis. 2) DKA. 3) Hypertriglyceridemia.   His blood sugar is much better.  Dr. Lauro FranklinPyrtle's note states that he will be back on an insulin drip, but I do not see that in his orders.  He is clinically stable, but still with a good amount of pain from his pancreatitis.  Plan: 1) Pain control. 2) Diabetes management per Hospitalist. 3) Continue with IV hydration.   LOS: 3 days   Emmersyn Kratzke D 10/19/2015, 8:15 AM

## 2015-10-20 DIAGNOSIS — K8500 Idiopathic acute pancreatitis without necrosis or infection: Secondary | ICD-10-CM

## 2015-10-20 LAB — GLUCOSE, CAPILLARY
GLUCOSE-CAPILLARY: 143 mg/dL — AB (ref 65–99)
GLUCOSE-CAPILLARY: 154 mg/dL — AB (ref 65–99)
GLUCOSE-CAPILLARY: 155 mg/dL — AB (ref 65–99)
GLUCOSE-CAPILLARY: 166 mg/dL — AB (ref 65–99)
GLUCOSE-CAPILLARY: 154 mg/dL — AB (ref 65–99)
GLUCOSE-CAPILLARY: 161 mg/dL — AB (ref 65–99)
GLUCOSE-CAPILLARY: 135 mg/dL — AB (ref 65–99)
GLUCOSE-CAPILLARY: 182 mg/dL — AB (ref 65–99)
Glucose-Capillary: 140 mg/dL — ABNORMAL HIGH (ref 65–99)
Glucose-Capillary: 145 mg/dL — ABNORMAL HIGH (ref 65–99)
Glucose-Capillary: 152 mg/dL — ABNORMAL HIGH (ref 65–99)
Glucose-Capillary: 153 mg/dL — ABNORMAL HIGH (ref 65–99)
Glucose-Capillary: 155 mg/dL — ABNORMAL HIGH (ref 65–99)
Glucose-Capillary: 156 mg/dL — ABNORMAL HIGH (ref 65–99)
Glucose-Capillary: 157 mg/dL — ABNORMAL HIGH (ref 65–99)
Glucose-Capillary: 158 mg/dL — ABNORMAL HIGH (ref 65–99)
Glucose-Capillary: 160 mg/dL — ABNORMAL HIGH (ref 65–99)
Glucose-Capillary: 162 mg/dL — ABNORMAL HIGH (ref 65–99)
Glucose-Capillary: 163 mg/dL — ABNORMAL HIGH (ref 65–99)
Glucose-Capillary: 164 mg/dL — ABNORMAL HIGH (ref 65–99)
Glucose-Capillary: 166 mg/dL — ABNORMAL HIGH (ref 65–99)
Glucose-Capillary: 193 mg/dL — ABNORMAL HIGH (ref 65–99)

## 2015-10-20 LAB — URINE CULTURE

## 2015-10-20 LAB — LIPID PANEL
Cholesterol: 255 mg/dL — ABNORMAL HIGH (ref 0–200)
HDL: 17 mg/dL — AB (ref >40)
LDL Cholesterol: UNDETERMINED mg/dL (ref 0–99)
Total CHOL/HDL Ratio: 15 RATIO
Triglycerides: 515 mg/dL — ABNORMAL HIGH (ref <150)
VLDL: UNDETERMINED mg/dL (ref 0–40)

## 2015-10-20 NOTE — Progress Notes (Signed)
[facility] PROGRESS NOTE  Brent Costa [id number] DOB: 11-09-1990 DOA: 10/16/2015 PCP: [name], [name], MD  Brief narrative 25 year old obese male with uncontrolled diabetes mellitus on insulin ( last A1c >10, non-adherent to diet and non-compliant with his insulin), hypertriglyceridemia (noncompliant with medication), hypertension, GERD, molluscum contagiosum, ongoing tobacco and alcohol use who presented to the ED with nausea, vomiting and abdominal pain since the day of admission. Patient reports vomiting more than 10 times mainly including food particle and without any blood. Denied any fevers, chills, shortness of breath, dysuria or diarrhea. He had epigastric pain 10/10 in severity nonradiating without any aggravating or relieving factors. Reports drinking 2 beers daily for the past 3 days. In the ED his lipase was elevated to 595, had significantly high triglycerides >1000, blood glucose in 400s with anion gap. Patient admitted for DKA and started on insulin drip. Also being managed for acute pancreatitis. A CT of his abdomen and pelvis showed inflammatory changes in the retroperitoneum adjacent to the duodenum and pancreatic head along with fatty liver.   Assessment/Plan: SIRS secondary to acute pancreatitis and DKA -Acute pancreatitis likely contributed by severe hypertriglyceridemia and ongoing alcohol use.   CT and ultrasound abdomen shows no biliary abnormality. LFTs are normal. -Received aggressive IV fluids. Pain control with when necessary morphine and antiemetics. Continue nothing by mouth. -Markedly elevated triglycerides on admission. ( 3100 ) now slowly trending down (515 today). Patient not taking fibrate. (Levels were >900 when checked recently as outpatient) . -Given significantly elevated triglycerides, GI recommend to resume insulin drip and monitor his triglycerides daily. Lipase has normalized.  -Continue insulin drip until triglycerides <500.  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 Resumed fibrate. Slowly trending down. Continue insulin drip until levels less than 500. Appreciate GI consult.  Mild DKA Resolved. Patient noncompliant to medications non-adherent to diet.. A1c of 12.4  Fever with tachycardia on 1/13 Patient febrile to 101.44F and tachycardic to 120s. Blood cultures and UA unremarkable. Chest x-ray ED for any infiltrate. Remains afebrile past 24 hours  Essential hypertension Resume home medications   Tobacco use Counseled on cessation.   Molluscum contagiosum Was treated with podophyllin  by PCP but still has itching. Added hydroxyzine and triamcinolone cream  Hyperkalemia with metabolic acidosis Resolved with hydration. Holding ACE inhibitor.  DVT prophylaxis: Subcutaneous Lovenox Diet: Nothing by mouth  Code Status: full code Family Communication: Friend at bedside Disposition Plan: Currently inpatient   Consultants:  lebeaur  GI  Procedures:  CT abdomen  Ultrasound abdomen  Antibiotics:  None  HPI/Subjective: Patient seen and examined. Was febrile overnight and tachycardic. Cultures sent. Still has epigastric discomfort but improving.  Objective: Filed Vitals:   10/20/15 0225 10/20/15 0550  BP: 144/71 152/64  Pulse: 107 100  Temp: 99.7 F (37.6 C) 98.4 F (36.9 C)  Resp: 24 28    Intake/Output Summary (Last 24 hours) at 10/20/15 1244 Last data filed at 10/20/15 0700  Gross per 24 hour  Intake 1899.69 ml  Output      0 ml  Net 1899.69 ml   Filed Weights   10/17/15 1130 10/18/15 1625  Weight: 122.471 kg (270 lb) 121.11 kg (267 lb)    Exam:   General:  Young obese male , not in distress  HEENT: dry mucosa,   Chest: Clear bilaterally, no added sounds  CVS: S1 and S2 normal, no murmurs or gallop  GI: Soft, nondistended, epigastric tenderness to pressure (some improvement), bowel sounds present  Musculoskeletal: Warm, diffuse molluscum contagiosum rashes.  Data  Reviewed: Basic Metabolic Panel:  Recent Labs Lab 10/18/15 0820 10/18/15 2213 10/19/15 0422 10/19/15 0733 10/19/15 2355  NA 134* 134* 135 134* 136  K 6.1* 4.0 3.7 3.9 3.7  CL 105 101 101 100* 103  CO2 15* 22 21* 20* 21*  GLUCOSE 226* 185* 167* 200* 183*  BUN 7 10 11 12 10   CREATININE 1.00 0.76 0.71 0.77 0.82  CALCIUM 7.7* 8.2* 8.2* 8.3* 8.7*   Liver Function Tests:  Recent Labs Lab 10/16/15 1508  AST 21  ALT 27  ALKPHOS 68  BILITOT 1.1  PROT 8.1  ALBUMIN 4.9    Recent Labs Lab 10/16/15 1508 10/18/15 0439  LIPASE 595* 48   No results for input(s): AMMONIA in the last 168 hours. CBC:  Recent Labs Lab 10/16/15 1216 10/16/15 1508  WBC 8.0 10.6*  NEUTROABS  --  7.4  HGB 14.9 18.2*  HCT 41.0 46.7  MCV 79.2 81.9  PLT 242 330   Cardiac Enzymes:  Recent Labs Lab 10/17/15 0005 10/17/15 0647 10/17/15 1400  TROPONINI <0.03 <0.03 <0.03   BNP (last 3 results) No results for input(s): BNP in the last 8760 hours.  ProBNP (last 3 results) No results for input(s): PROBNP in the last 8760 hours.  CBG:  Recent Labs Lab 10/20/15 0826 10/20/15 0929 10/20/15 1031 10/20/15 1137 10/20/15 1238  GLUCAP 140* 155* 155* 166* 152*    Recent Results (from the past 240 hour(s))  Culture, blood (x 2)     Status: None (Preliminary result)   Collection Time: 10/17/15 12:10 AM  Result Value Ref Range Status   Specimen Description BLOOD RIGHT ARM  Final   Special Requests IN PEDIATRIC BOTTLE  Final   Culture   Final    NO GROWTH 2 DAYS Performed at [facility]    Report Status PENDING  Incomplete  Culture, blood (x 2)     Status: None (Preliminary result)   Collection Time: 10/17/15  2:05 AM  Result Value Ref Range Status   Specimen Description BLOOD BLOOD LEFT HAND  Final   Special Requests BOTTLES DRAWN AEROBIC ONLY  Final   Culture   Final    NO GROWTH 3 DAYS Performed at [facility]    Report Status PENDING  Incomplete   Culture, Urine     Status: None   Collection Time: 10/18/15 11:42 PM  Result Value Ref Range Status   Specimen Description URINE, RANDOM  Final   Special Requests NONE  Final   Culture   Final    MULTIPLE SPECIES PRESENT, SUGGEST RECOLLECTION Performed at [facility]    Report Status 10/20/2015 FINAL  Final     Studies: Dg Chest Port 1 View  10/18/2015  CLINICAL DATA:  25 year old male with fever and weakness EXAM: PORTABLE CHEST 1 VIEW COMPARISON:  Radiograph dated 12/22/2013 FINDINGS: Single portable view of the chest does not demonstrate a focal consolidation. There is no pleural effusion or pneumothorax. The cardiac silhouette is within normal limits. The osseous structures appear unremarkable. IMPRESSION: No active disease. Electronically Signed   By: Elgie Collard M.D.   On: 10/18/2015 21:39    Scheduled Meds: . enoxaparin (LOVENOX) injection  40 mg Subcutaneous Daily  . omega-3 acid ethyl esters  1 g Oral BID  . pantoprazole (PROTONIX) IV  40 mg Intravenous Q24H  . triamcinolone cream  1 application Topical BID   Continuous Infusions: . sodium chloride    . dextrose 5 %  and 0.45% NaCl 100 mL/hr at 10/20/15 0816  . insulin (NOVOLIN-R) infusion 2.8 Units/hr (10/20/15 1240)     Time spent: 25 minutes    Halie Gass  [facility] Pager [phone] If 7PM-7AM, please contact night-coverage at [email or web address], password [facility] 10/20/2015, 12:44 PM  LOS: 4 days

## 2015-10-21 ENCOUNTER — Inpatient Hospital Stay (HOSPITAL_COMMUNITY): Payer: Self-pay

## 2015-10-21 DIAGNOSIS — R509 Fever, unspecified: Secondary | ICD-10-CM

## 2015-10-21 DIAGNOSIS — K858 Other acute pancreatitis without necrosis or infection: Secondary | ICD-10-CM | POA: Diagnosis present

## 2015-10-21 DIAGNOSIS — R1013 Epigastric pain: Secondary | ICD-10-CM | POA: Diagnosis present

## 2015-10-21 LAB — CBC
HEMATOCRIT: 35.8 % — AB (ref 39.0–52.0)
Hemoglobin: 11.9 g/dL — ABNORMAL LOW (ref 13.0–17.0)
MCH: 27.7 pg (ref 26.0–34.0)
MCHC: 33.2 g/dL (ref 30.0–36.0)
MCV: 83.4 fL (ref 78.0–100.0)
PLATELETS: 237 10*3/uL (ref 150–400)
RBC: 4.29 MIL/uL (ref 4.22–5.81)
RDW: 14.1 % (ref 11.5–15.5)
WBC: 8.7 10*3/uL (ref 4.0–10.5)

## 2015-10-21 LAB — HEPATIC FUNCTION PANEL
ALBUMIN: 2.9 g/dL — AB (ref 3.5–5.0)
ALK PHOS: 53 U/L (ref 38–126)
ALT: 22 U/L (ref 17–63)
AST: 21 U/L (ref 15–41)
BILIRUBIN TOTAL: 1 mg/dL (ref 0.3–1.2)
Bilirubin, Direct: 0.2 mg/dL (ref 0.1–0.5)
Indirect Bilirubin: 0.8 mg/dL (ref 0.3–0.9)
TOTAL PROTEIN: 6.9 g/dL (ref 6.5–8.1)

## 2015-10-21 LAB — LIPASE, BLOOD: Lipase: 37 U/L (ref 11–51)

## 2015-10-21 LAB — GLUCOSE, CAPILLARY
GLUCOSE-CAPILLARY: 163 mg/dL — AB (ref 65–99)
GLUCOSE-CAPILLARY: 168 mg/dL — AB (ref 65–99)
GLUCOSE-CAPILLARY: 179 mg/dL — AB (ref 65–99)
GLUCOSE-CAPILLARY: 171 mg/dL — AB (ref 65–99)
GLUCOSE-CAPILLARY: 174 mg/dL — AB (ref 65–99)
GLUCOSE-CAPILLARY: 177 mg/dL — AB (ref 65–99)
GLUCOSE-CAPILLARY: 186 mg/dL — AB (ref 65–99)
GLUCOSE-CAPILLARY: 173 mg/dL — AB (ref 65–99)
GLUCOSE-CAPILLARY: 169 mg/dL — AB (ref 65–99)
GLUCOSE-CAPILLARY: 191 mg/dL — AB (ref 65–99)
Glucose-Capillary: 138 mg/dL — ABNORMAL HIGH (ref 65–99)
Glucose-Capillary: 153 mg/dL — ABNORMAL HIGH (ref 65–99)
Glucose-Capillary: 167 mg/dL — ABNORMAL HIGH (ref 65–99)
Glucose-Capillary: 167 mg/dL — ABNORMAL HIGH (ref 65–99)
Glucose-Capillary: 169 mg/dL — ABNORMAL HIGH (ref 65–99)
Glucose-Capillary: 178 mg/dL — ABNORMAL HIGH (ref 65–99)

## 2015-10-21 LAB — LIPID PANEL
CHOLESTEROL: 249 mg/dL — AB (ref 0–200)
HDL: 18 mg/dL — ABNORMAL LOW (ref >40)
LDL Cholesterol: UNDETERMINED mg/dL (ref 0–99)
Total CHOL/HDL Ratio: 13.8 RATIO
Triglycerides: 452 mg/dL — ABNORMAL HIGH (ref <150)
VLDL: UNDETERMINED mg/dL (ref 0–40)

## 2015-10-21 MED ORDER — SODIUM CHLORIDE 0.9 % IV SOLN
INTRAVENOUS | Status: DC
  Administered 2015-10-21 – 2015-10-22 (×3): via INTRAVENOUS
  Administered 2015-10-22: 150 mL via INTRAVENOUS
  Administered 2015-10-23 – 2015-10-24 (×4): via INTRAVENOUS

## 2015-10-21 MED ORDER — IOHEXOL 300 MG/ML  SOLN
25.0000 mL | INTRAMUSCULAR | Status: AC
  Administered 2015-10-21 (×2): 25 mL via ORAL

## 2015-10-21 MED ORDER — INSULIN ASPART 100 UNIT/ML ~~LOC~~ SOLN
0.0000 [IU] | SUBCUTANEOUS | Status: DC
  Administered 2015-10-21 – 2015-10-22 (×4): 3 [IU] via SUBCUTANEOUS
  Administered 2015-10-22: 5 [IU] via SUBCUTANEOUS
  Administered 2015-10-22: 2 [IU] via SUBCUTANEOUS
  Administered 2015-10-22 (×2): 3 [IU] via SUBCUTANEOUS
  Administered 2015-10-22: 2 [IU] via SUBCUTANEOUS
  Administered 2015-10-23 (×2): 3 [IU] via SUBCUTANEOUS
  Administered 2015-10-23: 5 [IU] via SUBCUTANEOUS
  Administered 2015-10-23: 3 [IU] via SUBCUTANEOUS
  Administered 2015-10-23: 5 [IU] via SUBCUTANEOUS
  Administered 2015-10-23 – 2015-10-24 (×2): 3 [IU] via SUBCUTANEOUS
  Administered 2015-10-24 (×3): 2 [IU] via SUBCUTANEOUS
  Administered 2015-10-24: 3 [IU] via SUBCUTANEOUS
  Administered 2015-10-25: 5 [IU] via SUBCUTANEOUS
  Administered 2015-10-25: 3 [IU] via SUBCUTANEOUS
  Administered 2015-10-25 (×2): 2 [IU] via SUBCUTANEOUS
  Administered 2015-10-25 – 2015-10-26 (×5): 3 [IU] via SUBCUTANEOUS
  Administered 2015-10-26 (×2): 5 [IU] via SUBCUTANEOUS

## 2015-10-21 MED ORDER — INSULIN GLARGINE 100 UNIT/ML ~~LOC~~ SOLN
25.0000 [IU] | SUBCUTANEOUS | Status: DC
  Administered 2015-10-21 – 2015-10-25 (×5): 25 [IU] via SUBCUTANEOUS
  Filled 2015-10-21 (×6): qty 0.25

## 2015-10-21 MED ORDER — INSULIN GLARGINE 100 UNIT/ML ~~LOC~~ SOLN: 25.0000 [IU] | Freq: Every day | SUBCUTANEOUS | Status: DC

## 2015-10-21 MED ORDER — IOHEXOL 300 MG/ML  SOLN
100.0000 mL | Freq: Once | INTRAMUSCULAR | Status: AC | PRN
  Administered 2015-10-21: 100 mL via INTRAVENOUS

## 2015-10-21 NOTE — Progress Notes (Signed)
Inpatient Diabetes Program Recommendations  AACE/ADA: New Consensus Statement on Inpatient Glycemic Control (2015)  Target Ranges:  Prepandial:   less than 140 mg/dL      Peak postprandial:   less than 180 mg/dL (1-2 hours)      Critically ill patients:  140 - 180 mg/dL   Results for Brent Costa, Brent Costa (MRN [id number]) as of 10/21/2015 10:04  Ref. Range 10/17/2015 06:47  Hemoglobin A1C Latest Ref Range: 4.8-5.6 % 12.4 (H)    Admit with: Pancreatitis  History: DM, HTN, Hypertriglyceridemia, ETOH  Home DM Meds: Lantus 45 units QHS       Novolog 3-10 units tidwc        Metformin 1000 mg bid  Current Insulin Orders: Lantus 25 units daily      Novolog Moderate SSI (0-15 units) Q4 hours     -Note patient transitioned off IV Insulin drip this morning to Lantus and Novolog.  Lantus 25 units given at 10am.  -A1c= 12.4%.  Poorly controlled CBGs at home.  [facility]  -Per DM Coordinator notes from 10/18/15, patient has little interest in his health and stated "I thought taking insulin was Costa joke."    --Will follow patient during hospitalization--  Ambrose FinlandJeannine Johnston Charletta Voight RN, MSN, CDE Diabetes Coordinator Inpatient Glycemic Control Team Team Pager: [phone] (8a-5p)

## 2015-10-21 NOTE — Progress Notes (Addendum)
     [facility] Progress Note  Subjective:  T 99.7 this morning.Lipase 37.LFTs nl. WBC 8.7.Triglycerides 452, cholesterol 249.Continue to c/o/ pain and nausea, no vomiting.   Objective:  Vital signs in last 24 hours: Temp:  [98.9 F (37.2 C)-102.3 F (39.1 C)] 99.7 F (37.6 C) (01/16 0420) Pulse Rate:  [98-107] 101 (01/16 0420) Resp:  [20-24] 20 (01/16 0420) BP: (147-154)/(73-92) 154/75 mmHg (01/16 0420) SpO2:  [97 %-99 %] 99 % (01/16 0420) Last BM Date: 10/16/15 General:   Alert,  Well-developed,    in NAD Heart:  Regular rate and rhythm; no murmurs Pulm;lungs clear Abdomen:  Soft, tender to palpation in epigastric area,  nondistended. Normal bowel sounds, without guarding, and without rebound.   Extremities:  Without edema. Neurologic:Alert and  oriented x4;  grossly normal neurologically. Psych:  Alert and cooperative. Normal mood and affect.    BMET  Recent Labs  10/19/15 0422 10/19/15 0733 10/19/15 2355  NA 135 134* 136  K 3.7 3.9 3.7  CL 101 100* 103  CO2 21* 20* 21*  GLUCOSE 167* 200* 183*  BUN 11 12 10   CREATININE 0.71 0.77 0.82  CALCIUM 8.2* 8.3* 8.7*     ASSESSMENT/PLAN:  1) Acute pancreatitis. 2) DKA. 3) Hypertriglyceridemia.  Insulin drip was d/c'd this morning. Pt for CT abd to eval staus of pancreatitis. Continue management per hospitalist with analgesia, IVF and diabetes mgt. Encouraged pt to get OOB and ambulate.   LOS: 5 days   Hvozdovic, Tollie PizzaLori P PA-C 10/21/2015, Pager [phone] Mon-Fri 8a-5p [phone] after 5p, weekends, holidays    [facility] Attending   I have taken an interval history, reviewed the chart and examined the patient. I agree with the Advanced Practitioner's note, impression and recommendations.   He is still having abd pain and Ct showed worsening inflammation and fluid collection.  If he continues to have problems consider nasoenteric tube and feeding that way - will need to see if he can tolerate  clears  Pain likely w/ pancreatitis but difficult to determine his ability to tolerate Would consider trying some oral pain  meds  ? Change from morphine to hydromorphone  Iva Booparl E. Oliver Neuwirth, MD, [facility] [phone] (pager) [phone] after 5 PM, weekends and holidays  10/21/2015 7:26 PM

## 2015-10-21 NOTE — Progress Notes (Signed)
[facility] PROGRESS NOTE  Gustavo LahShrone A Croft [id number] DOB: [birth year] DOA: 10/16/2015 PCP: [name], [name], MD  Brief narrative 25 year old obese male with uncontrolled diabetes mellitus on insulin ( last A1c >10, non-adherent to diet and non-compliant with his insulin), hypertriglyceridemia (noncompliant with medication), hypertension, GERD, molluscum contagiosum, ongoing tobacco and alcohol use who presented to the ED with nausea, vomiting and abdominal pain since the day of admission. Patient reports vomiting more than 10 times mainly including food particle and without any blood. Denied any fevers, chills, shortness of breath, dysuria or diarrhea. He had epigastric pain 10/10 in severity nonradiating without any aggravating or relieving factors. Reports drinking 2 beers daily for the past 3 days. In the ED his lipase was elevated to 595, had significantly high triglycerides >1000, blood glucose in 400s with anion gap. Patient admitted for DKA and started on insulin drip. Also being managed for acute pancreatitis. A CT of his abdomen and pelvis showed inflammatory changes in the retroperitoneum adjacent to the duodenum and pancreatic head along with fatty liver.   Assessment/Plan: SIRS secondary to acute pancreatitis and DKA -Acute pancreatitis likely contributed by severe hypertriglyceridemia and ongoing alcohol use.   CT and ultrasound abdomen shows no biliary abnormality. LFTs are normal. Shows increased inflammation adjacent to the duodenum and pancreatic head. -Received aggressive IV fluids. Pain control with when necessary morphine and antiemetics. Continue nothing by mouth. -Markedly elevated triglycerides on admission. ( 3100 ) now  trending down (450 today). Patient not taking fibrate. (Levels were >900 when checked recently as outpatient) . -Given significantly elevated triglycerides placed on IV insulin drip. Lipase normalized. Discontinued insulin drip as triglyceride  improved.    Fever Having temperature spikes of 102 Fwith ongoing abdominal pain and tenderness. Will check repeat CT of the abdomen and pelvis.  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 Resumed fibrate. Slowly trending down. Continue insulin drip until levels less than 500. Appreciate GI consult.  Mild DKA Resolved. Patient noncompliant to medications non-adherent to diet.. A1c of 12.4  Fever with tachycardia on 1/13 Patient febrile to 101.32F and tachycardic to 120s. Blood cultures and UA unremarkable. Chest x-ray ED for any infiltrate. Remains afebrile past 24 hours  Essential hypertension Resume home medications   Tobacco use Counseled on cessation.   Molluscum contagiosum Was treated with podophyllin  by PCP but still has itching. Added hydroxyzine and triamcinolone cream  Hyperkalemia with metabolic acidosis Resolved with hydration. Holding ACE inhibitor.  DVT prophylaxis: Subcutaneous Lovenox Diet: Nothing by mouth  Code Status: full code Family Communication: Friend at bedside Disposition Plan: Currently inpatient   Consultants:  lebeaur  GI  Procedures:  CT abdomen  Ultrasound abdomen  Antibiotics:  None  HPI/Subjective: Patient seen and examined. Was febrile overnight and tachycardic. Cultures sent. Still has epigastric discomfort but improving.  Objective: Filed Vitals:   10/20/15 2313 10/21/15 0420  BP:  154/75  Pulse:  101  Temp: 99.6 F (37.6 C) 99.7 F (37.6 C)  Resp:  20    Intake/Output Summary (Last 24 hours) at 10/21/15 1207 Last data filed at 10/21/15 0700  Gross per 24 hour  Intake 2533.08 ml  Output    400 ml  Net 2133.08 ml   Filed Weights   10/17/15 1130 10/18/15 1625  Weight: 122.471 kg (270 lb) 121.11 kg (267 lb)    Exam:   General:  Young obese male in some distress with abdominal pain  HEENT: moist mucosa  Chest: Clear bilaterally, no added sounds  CVS: S1 and S2  normal, no murmurs or gallop  GI: Soft,  nondistended, epigastric tenderness to pressure ,, bowel sounds present  Musculoskeletal: Warm, diffuse molluscum contagiosum rashes.  Data Reviewed: Basic Metabolic Panel:  Recent Labs Lab 10/18/15 0820 10/18/15 2213 10/19/15 0422 10/19/15 0733 10/19/15 2355  NA 134* 134* 135 134* 136  K 6.1* 4.0 3.7 3.9 3.7  CL 105 101 101 100* 103  CO2 15* 22 21* 20* 21*  GLUCOSE 226* 185* 167* 200* 183*  BUN 7 10 11 12 10   CREATININE 1.00 0.76 0.71 0.77 0.82  CALCIUM 7.7* 8.2* 8.2* 8.3* 8.7*   Liver Function Tests:  Recent Labs Lab 10/16/15 1508 10/21/15 0930  AST 21 21  ALT 27 22  ALKPHOS 68 53  BILITOT 1.1 1.0  PROT 8.1 6.9  ALBUMIN 4.9 2.9*    Recent Labs Lab 10/16/15 1508 10/18/15 0439 10/21/15 0930  LIPASE 595* 48 37   No results for input(s): AMMONIA in the last 168 hours. CBC:  Recent Labs Lab 10/16/15 1216 10/16/15 1508 10/21/15 0930  WBC 8.0 10.6* 8.7  NEUTROABS  --  7.4  --   HGB 14.9 18.2* 11.9*  HCT 41.0 46.7 35.8*  MCV 79.2 81.9 83.4  PLT 242 330 237   Cardiac Enzymes:  Recent Labs Lab 10/17/15 0005 10/17/15 0647 10/17/15 1400  TROPONINI <0.03 <0.03 <0.03   BNP (last 3 results) No results for input(s): BNP in the last 8760 hours.  ProBNP (last 3 results) No results for input(s): PROBNP in the last 8760 hours.  CBG:  Recent Labs Lab 10/21/15 0731 10/21/15 0834 10/21/15 0949 10/21/15 1054 10/21/15 1158  GLUCAP 174* 177* 186* 173* 169*    Recent Results (from the past 240 hour(s))  Culture, blood (x 2)     Status: None (Preliminary result)   Collection Time: 10/17/15 12:10 AM  Result Value Ref Range Status   Specimen Description BLOOD RIGHT ARM  Final   Special Requests IN PEDIATRIC BOTTLE  Final   Culture   Final    NO GROWTH 4 DAYS Performed at [facility]    Report Status PENDING  Incomplete  Culture, blood (x 2)     Status: None (Preliminary result)   Collection Time: 10/17/15  2:05 AM  Result Value Ref  Range Status   Specimen Description BLOOD BLOOD LEFT HAND  Final   Special Requests BOTTLES DRAWN AEROBIC ONLY  Final   Culture   Final    NO GROWTH 4 DAYS Performed at [facility]    Report Status PENDING  Incomplete  Culture, blood (routine x 2)     Status: None (Preliminary result)   Collection Time: 10/18/15 10:03 PM  Result Value Ref Range Status   Specimen Description BLOOD LEFT ARM  Final   Special Requests BOTTLES DRAWN AEROBIC ONLY 6CC  Final   Culture   Final    NO GROWTH 2 DAYS Performed at [facility]    Report Status PENDING  Incomplete  Culture, blood (routine x 2)     Status: None (Preliminary result)   Collection Time: 10/18/15 10:13 PM  Result Value Ref Range Status   Specimen Description BLOOD LEFT HAND  Final   Special Requests BOTTLES DRAWN AEROBIC AND ANAEROBIC 10CC  Final   Culture   Final    NO GROWTH 2 DAYS Performed at [facility]    Report Status PENDING  Incomplete  Culture, Urine     Status: None   Collection  Time: 10/18/15 11:42 PM  Result Value Ref Range Status   Specimen Description URINE, RANDOM  Final   Special Requests NONE  Final   Culture   Final    MULTIPLE SPECIES PRESENT, SUGGEST RECOLLECTION Performed at [facility]    Report Status 10/20/2015 FINAL  Final     Studies: No results found.  Scheduled Meds: . enoxaparin (LOVENOX) injection  40 mg Subcutaneous Daily  . insulin aspart  0-15 Units Subcutaneous 6 times per day  . insulin glargine  25 Units Subcutaneous Q24H  . omega-3 acid ethyl esters  1 g Oral BID  . pantoprazole (PROTONIX) IV  40 mg Intravenous Q24H  . triamcinolone cream  1 application Topical BID   Continuous Infusions: . sodium chloride    . dextrose 5 % and 0.45% NaCl 100 mL/hr at 10/21/15 1205     Time spent: 25 minutes    Eddie North  [facility] Pager [phone] If 7PM-7AM, please contact night-coverage at [email or web address], password [facility] 10/21/2015,  12:07 PM  LOS: 5 days

## 2015-10-21 NOTE — Care Management Note (Signed)
Case Management Note  Patient Details  Name: Brent Costa MRN: [id number] Date of Birth: 1990-10-21  Subjective/Objective:  25 y/o m admitted w/DKA, Pancreatitis. From home.                  Action/Plan:d/c home.   Expected Discharge Date:   (unknown)               Expected Discharge Plan:  Home/Self Care  In-House Referral:     Discharge planning Services  CM Consult, Indigent Health Clinic  Post Acute Care Choice:    Choice offered to:     DME Arranged:    DME Agency:     HH Arranged:    HH Agency:     Status of Service:  In process, will continue to follow  Medicare Important Message Given:    Date Medicare IM Given:    Medicare IM give by:    Date Additional Medicare IM Given:    Additional Medicare Important Message give by:     If discussed at Long Length of Stay Meetings, dates discussed:    Additional Comments:  Brent Costa, Brent Leas, RN 10/21/2015, 4:00 PM

## 2015-10-22 DIAGNOSIS — E781 Pure hyperglyceridemia: Secondary | ICD-10-CM | POA: Diagnosis present

## 2015-10-22 LAB — LIPID PANEL
CHOL/HDL RATIO: 12.9 ratio
Cholesterol: 232 mg/dL — ABNORMAL HIGH (ref 0–200)
HDL: 18 mg/dL — AB (ref >40)
LDL CALC: 142 mg/dL — AB (ref 0–99)
TRIGLYCERIDES: 361 mg/dL — AB (ref <150)
VLDL: 72 mg/dL — AB (ref 0–40)

## 2015-10-22 LAB — GLUCOSE, CAPILLARY
GLUCOSE-CAPILLARY: 191 mg/dL — AB (ref 65–99)
GLUCOSE-CAPILLARY: 242 mg/dL — AB (ref 65–99)
Glucose-Capillary: 174 mg/dL — ABNORMAL HIGH (ref 65–99)
Glucose-Capillary: 143 mg/dL — ABNORMAL HIGH (ref 65–99)
Glucose-Capillary: 167 mg/dL — ABNORMAL HIGH (ref 65–99)

## 2015-10-22 LAB — CULTURE, BLOOD (ROUTINE X 2)
CULTURE: NO GROWTH
CULTURE: NO GROWTH

## 2015-10-22 MED ORDER — ZOLPIDEM TARTRATE 5 MG PO TABS
5.0000 mg | ORAL_TABLET | Freq: Once | ORAL | Status: AC
  Administered 2015-10-22: 5 mg via ORAL
  Filled 2015-10-22: qty 1

## 2015-10-22 MED ORDER — HYDROMORPHONE HCL 2 MG/ML IJ SOLN
2.0000 mg | INTRAMUSCULAR | Status: DC | PRN
  Administered 2015-10-22 – 2015-10-26 (×23): 2 mg via INTRAVENOUS
  Filled 2015-10-22 (×23): qty 1

## 2015-10-22 NOTE — Progress Notes (Signed)
Initial Nutrition Assessment  DOCUMENTATION CODES:   Obesity unspecified  INTERVENTION:  -Diet advancement as medically feasible -RD will continue to monitor for needs prior to d/c  NUTRITION DIAGNOSIS:   Limited adherence to nutrition-related recommendations related to chronic illness as evidenced by other (see comment) (HgA1c of 12.4% and recent DKA diagnosis).  GOAL:   Patient will meet greater than or equal to 90% of their needs  MONITOR:   Diet advancement, Weight trends, Labs, PO intake  REASON FOR ASSESSMENT:   NPO/Clear Liquid Diet    ASSESSMENT:   Pt is 25 year old obese male with uncontrollable diabetes mellitus on insulin (last A1c>10, non-adherent to diet and non-compliant with his insulin), hypertriglyceridemia (noncompliant with medication), hypertension, GERD, molluscum contagiosum, ongoing tobacco and alcohol use who presented to the ED with nausea, vomiting and abdominal pain since the day of admission. Pt reports drinking 2 beers daily for the past 3 days.    Pt reports not eating for the past 7 days due to N/V. Pt was advanced to a clear liquid diet (10/22/15 at 10 am).He was able to consume his clear liquid tray this morning and reports no N/V with intakes. Pt reports at home he was not following a diabetes friendly diet. Pt stated that he "learned his lesson" and wants to make lifestyle changes. Pt reports that he sometimes checked his blood glucose at home and his blood glucose was usually in the 200 range.  Pt reported that he has dropped 10 lb since being admitted. Per chart review last weight entered was 267 lb on 10/18/15. Per weight history in chart pt weight was 272 lb on 06/25/15. Pt has 2% weight loss (5 lb) within 4 months which is not significant. Pt obesity/borderline morbid obesity with BMI of 39.5.     Unable to conduct a nutrition focused physical exam.  Medications reviewed. Labs: elevated lipid profile, elevated capillary glucose (143-191 mg/dL),  [id number] 60.4%   Diet Order:  Diet clear liquid Room service appropriate?: Yes; Fluid consistency:: Thin  Skin:  Reviewed, no issues  Last BM:  10/22/15  Height:   Ht Readings from Last 1 Encounters:  10/18/15  (1.753 m)    Weight:   Wt Readings from Last 1 Encounters:  10/18/15 267 lb (121.11 kg)    Ideal Body Weight:  72.7 kg (kg)  BMI:  Body mass index is 39.41 kg/(m^2).  Estimated Nutritional Needs:   Kcal:  1800-2000  Protein:  90-100  Fluid:  1.8-2 L  EDUCATION NEEDS:   Education needs no appropriate at this time   [place], Dietetic Intern Pager: [phone]

## 2015-10-22 NOTE — Progress Notes (Signed)
[facility] PROGRESS NOTE  Brent Costa ZOX:[id number] DOB: [birth year] DOA: 10/16/2015 PCP: Jeanann Lewandowsky, MD  Brief narrative 25 year old obese male with uncontrolled diabetes mellitus on insulin ( last A1c >10, non-adherent to diet and non-compliant with his insulin), hypertriglyceridemia (noncompliant with medication), hypertension, GERD, molluscum contagiosum, ongoing tobacco and alcohol use who presented to the ED with nausea, vomiting and abdominal pain since the day of admission. Patient reports vomiting more than 10 times mainly including food particle and without any blood. Denied any fevers, chills, shortness of breath, dysuria or diarrhea. He had epigastric pain 10/10 in severity nonradiating without any aggravating or relieving factors. Reports drinking 2 beers daily for the past 3 days. In the ED his lipase was elevated to 595, had significantly high triglycerides >1000, blood glucose in 400s with anion gap. Patient admitted for DKA and started on insulin drip. Also being managed for acute pancreatitis. A CT of his abdomen and pelvis showed inflammatory changes in the retroperitoneum adjacent to the duodenum and pancreatic head along with fatty liver.   Assessment/Plan: SIRS secondary to acute pancreatitis  -Acute pancreatitis likely contributed by severe hypertriglyceridemia and ongoing alcohol use.   CT abdomen on admission showed increased inflammation adjacent to the duodenum and pancreatic head. -Received aggressive IV fluids. Switched morphine 2 IV Dilaudid given ongoing pain (symptoms much better on Dilaudid). Start on clears. -Repeat CT abdomen and pelvis done on 1/16 for ongoing pain and fevers showed worsened pancreatic inflammation. -Patient was placed on insulin drip for significantly high triglycerides (3100 on admission). Now improved to 361. Continue 500. -Appreciated GI follow-up. Started on clears. Patient also reports having small bowel  movement.    Fever Patient was having temperature spikes of 102F last few days. CT scan of the abdomen and pelvis repeated showed worsened pancreatic inflammation with peripancreatic fluid and inflammatory changes. GI recommends this can be expected with fluid leak. Will monitor off antibiotics.  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 Resumed fibrate. Improving daily.. Insulin drip discontinued on 1/16.  DKA on admission. Resolved. Patient noncompliant to medications non-adherent to diet.. A1c of 12.4. Placed on Lantus with sliding scale coverage.   Essential hypertension Continue home medications   Tobacco use Counseled on cessation.   Molluscum contagiosum Was treated with podophyllin  by PCP but did not resolve. Added hydroxyzine and triamcinolone cream. Symptoms better today.  Hyperkalemia with metabolic acidosis Resolved with hydration. Holding ACE inhibitor.  DVT prophylaxis: Subcutaneous Lovenox Diet: Clear Liquid  Code Status: full code Family Communication: None at bedside Disposition Plan: Home once tolerating advanced diet and pain much improved.   Consultants:  [facility]  GI  Procedures:  CT abdomen 1/11 and 1/16  Ultrasound abdomen  Antibiotics:  None  HPI/Subjective: Patient seen and examined. Brent pain better after switching to IV Dilaudid. Reports having a small bowel movement. Denies nausea or vomiting. Had a fever of 100.6F last 24 hours Objective: Filed Vitals:   10/22/15 0414 10/22/15 0530  BP: 168/91   Pulse: 107   Temp: 100.9 F (38.3 C) 99.7 F (37.6 C)  Resp: 20     Intake/Output Summary (Last 24 hours) at 10/22/15 1330 Last data filed at 10/22/15 0725  Gross per 24 hour  Intake   2000 ml  Output    525 ml  Net   1475 ml   Filed Weights   10/17/15 1130 10/18/15 1625  Weight: 122.471 kg (270 lb) 121.11 kg (267 lb)    Exam:   General:  Young obese male  not in distress  HEENT: moist mucosa  Chest: Clear  bilaterally, no added sounds  CVS: S1 and S2 normal, no murmurs or gallop  GI: Soft, nondistended, epigastric tenderness to pressure ,, bowel sounds present  Musculoskeletal: Warm, diffuse molluscum contagiosum rashes in arms and legs. (Improved)  Data Reviewed: Basic Metabolic Panel:  Recent Labs Lab 10/18/15 0820 10/18/15 2213 10/19/15 0422 10/19/15 0733 10/19/15 2355  NA 134* 134* 135 134* 136  K 6.1* 4.0 3.7 3.9 3.7  CL 105 101 101 100* 103  CO2 15* 22 21* 20* 21*  GLUCOSE 226* 185* 167* 200* 183*  BUN CREATININE 1.00 0.76 0.71 0.77 0.82  CALCIUM 7.7* 8.2* 8.2* 8.3* 8.7*   Liver Function Tests:  Recent Labs Lab 10/16/15 1508 10/21/15 0930  AST 21 21  ALT 27 22  ALKPHOS 68 53  BILITOT 1.1 1.0  PROT 8.1 6.9  ALBUMIN 4.9 2.9*    Recent Labs Lab 10/16/15 1508 10/18/15 0439 10/21/15 0930  LIPASE 595* 48 37   No results for input(s): AMMONIA in the last 168 hours. CBC:  Recent Labs Lab 10/16/15 1216 10/16/15 1508 10/21/15 0930  WBC 8.0 10.6* 8.7  NEUTROABS  --  7.4  --   HGB 14.9 18.2* 11.9*  HCT 41.0 46.7 35.8*  MCV 79.2 81.9 83.4  PLT 242 330 237   Cardiac Enzymes:  Recent Labs Lab 10/17/15 0005 10/17/15 0647 10/17/15 1400  TROPONINI <0.03 <0.03 <0.03   BNP (last 3 results) No results for input(s): BNP in the last 8760 hours.  ProBNP (last 3 results) No results for input(s): PROBNP in the last 8760 hours.  CBG:  Recent Labs Lab 10/21/15 1958 10/21/15 2353 10/22/15 0413 10/22/15 0725 10/22/15 1200  GLUCAP 191* 153* 174* 143* 167*    Recent Results (from the past 240 hour(s))  Culture, blood (x 2)     Status: None   Collection Time: 10/17/15 12:10 AM  Result Value Ref Range Status   Specimen Description BLOOD RIGHT ARM  Final   Special Requests IN PEDIATRIC BOTTLE  Final   Culture   Final    NO GROWTH 5 DAYS Performed at [facility]    Report Status 10/22/2015 FINAL  Final  Culture, blood (x  2)     Status: None   Collection Time: 10/17/15  2:05 AM  Result Value Ref Range Status   Specimen Description BLOOD BLOOD LEFT HAND  Final   Special Requests BOTTLES DRAWN AEROBIC ONLY  Final   Culture   Final    NO GROWTH 5 DAYS Performed at [facility]    Report Status 10/22/2015 FINAL  Final  Culture, blood (routine x 2)     Status: None (Preliminary result)   Collection Time: 10/18/15 10:03 PM  Result Value Ref Range Status   Specimen Description BLOOD LEFT ARM  Final   Special Requests BOTTLES DRAWN AEROBIC ONLY 6CC  Final   Culture   Final    NO GROWTH 3 DAYS Performed at [facility]    Report Status PENDING  Incomplete  Culture, blood (routine x 2)     Status: None (Preliminary result)   Collection Time: 10/18/15 10:13 PM  Result Value Ref Range Status   Specimen Description BLOOD LEFT HAND  Final   Special Requests BOTTLES DRAWN AEROBIC AND ANAEROBIC 10CC  Final   Culture   Final    NO GROWTH 3 DAYS Performed at [facility]    Report Status PENDING  Incomplete  Culture, Urine     Status: None   Collection Time: 10/18/15 11:42 PM  Result Value Ref Range Status   Specimen Description URINE, RANDOM  Final   Special Requests NONE  Final   Culture   Final    MULTIPLE SPECIES PRESENT, SUGGEST RECOLLECTION Performed at [facility]    Report Status 10/20/2015 FINAL  Final     Studies: Ct Abdomen Pelvis W Contrast  10/21/2015  CLINICAL DATA:  Followup exam for pancreatitis. Patient with continued abdominal pain and nausea. EXAM: CT ABDOMEN AND PELVIS WITH CONTRAST TECHNIQUE: Multidetector CT imaging of the abdomen and pelvis was performed using the standard protocol following bolus administration of intravenous contrast. CONTRAST:  OMNIPAQUE IOHEXOL 300 MG/ML  SOLN COMPARISON:  Abdomen and pelvis CTs, 10/16/2015 and 11/18/2013. FINDINGS: Lung bases: Increased patchy and linear opacity at the right lung base when compared the prior  study consistent with increased atelectasis. Heart normal in size. Pancreas: Extensive peripancreatic inflammatory change and fluid extends along the small bowel mesentery and involves the omentum, courses along the gastrohepatic ligament and along the anterior para renal fascia on the right. Inflammatory changes have worsened when compared to the prior CT. Small amount of ascites collects in the pelvis. Pancreas shows diffuse enhancement. The superior mesenteric vein, portal vein and splenic vein remain patent. Liver:  Mildly enlarged.  No liver mass or focal lesion. Spleen:  Unremarkable. Gallbladder and biliary tree: There is some increased attenuation in the gallbladder without a discrete shadowing stone. No bile duct dilation. Adrenal glands:  Unremarkable. Kidneys, ureters, bladder:  Unremarkable. Lymph nodes:  No adenopathy. Gastrointestinal: Inflammatory changes surround the distal stomach and duodenum. Stomach and duodenum otherwise unremarkable. Normal small bowel. Inflammatory changes also contacts the right colon and transverse colon. Colon otherwise unremarkable. Normal appendix visualized. Musculoskeletal:  Unremarkable. IMPRESSION: 1. Worsened pancreatic inflammation when compared to the prior study. There is greater peripancreatic fluid and inflammatory change. A small amount of ascites has developed since the prior study. 2. No evidence of pancreatic necrosis. No evidence of focal collection to suggest an abscess or pseudocyst. No evidence of venous thrombosis. 3. Increased right lung base atelectasis. Persistent, mild hepatomegaly. Electronically Signed   By: Amie Portland M.D.   On: 10/21/2015 13:37    Scheduled Meds: . enoxaparin (LOVENOX) injection  40 mg Subcutaneous Daily  . insulin aspart  0-15 Units Subcutaneous 6 times per day  . insulin glargine  25 Units Subcutaneous Q24H  . omega-3 acid ethyl esters  1 g Oral BID  . pantoprazole (PROTONIX) IV  40 mg Intravenous Q24H  .  triamcinolone cream  1 application Topical BID   Continuous Infusions: . sodium chloride 150 mL/hr at 10/22/15 0400     Time spent: 25 minutes    Dejaun Costa  [facility] Pager [phone] If 7PM-7AM, please contact night-coverage at [email or web address], password [facility] 10/22/2015, 1:30 PM  LOS: 6 days

## 2015-10-22 NOTE — Progress Notes (Signed)
[facility] Progress Note  Subjective:      Ct showed worsening inflammation and fluid collection. Pt has less pain this morning, less nausea, had a small BM.   Objective:  Vital signs in last 24 hours: Temp:  [98.6 F (37 C)-100.9 F (38.3 C)] 99.7 F (37.6 C) (01/17 0530) Pulse Rate:  [97-107] 107 (01/17 0414) Resp:  [20] 20 (01/17 0414) BP: (149-168)/(72-91) 168/91 mmHg (01/17 0414) SpO2:  [100 %] 100 % (01/17 0414) Last BM Date: 10/17/15 General: Alert, Well-developed, in NAD Heart: Regular rate and rhythm; no murmurs Pulm;lungs clear Abdomen: Soft, tender to palpation in epigastric area, nondistended. Normal bowel sounds, without guarding, and without rebound.  Extremities: Without edema. Neurologic:Alert and oriented x4; grossly normal neurologically. Psych: Alert and cooperative. Normal mood and affect.     Ct Abdomen Pelvis W Contrast  10/21/2015  CLINICAL DATA:  Followup exam for pancreatitis. Patient with continued abdominal pain and nausea. EXAM: CT ABDOMEN AND PELVIS WITH CONTRAST TECHNIQUE: Multidetector CT imaging of the abdomen and pelvis was performed using the standard protocol following bolus administration of intravenous contrast. CONTRAST:  OMNIPAQUE IOHEXOL 300 MG/ML  SOLN COMPARISON:  Abdomen and pelvis CTs, 10/16/2015 and 11/18/2013. FINDINGS: Lung bases: Increased patchy and linear opacity at the right lung base when compared the prior study consistent with increased atelectasis. Heart normal in size. Pancreas: Extensive peripancreatic inflammatory change and fluid extends along the small bowel mesentery and involves the omentum, courses along the gastrohepatic ligament and along the anterior para renal fascia on the right. Inflammatory changes have worsened when compared to the prior CT. Small amount of ascites collects in the pelvis. Pancreas shows diffuse enhancement. The superior mesenteric vein, portal vein and splenic  vein remain patent. Liver:  Mildly enlarged.  No liver mass or focal lesion. Spleen:  Unremarkable. Gallbladder and biliary tree: There is some increased attenuation in the gallbladder without a discrete shadowing stone. No bile duct dilation. Adrenal glands:  Unremarkable. Kidneys, ureters, bladder:  Unremarkable. Lymph nodes:  No adenopathy. Gastrointestinal: Inflammatory changes surround the distal stomach and duodenum. Stomach and duodenum otherwise unremarkable. Normal small bowel. Inflammatory changes also contacts the right colon and transverse colon. Colon otherwise unremarkable. Normal appendix visualized. Musculoskeletal:  Unremarkable. IMPRESSION: 1. Worsened pancreatic inflammation when compared to the prior study. There is greater peripancreatic fluid and inflammatory change. A small amount of ascites has developed since the prior study. 2. No evidence of pancreatic necrosis. No evidence of focal collection to suggest an abscess or pseudocyst. No evidence of venous thrombosis. 3. Increased right lung base atelectasis. Persistent, mild hepatomegaly. Electronically Signed   By: Amie Portland M.D.   On: 10/21/2015 13:37    ASSESSMENT/PLAN:  25 yo male with hyper triglyceridemia and DKA, admitted with abd pain and nausea, CT findings/labs C/W pancreatitis. Repeat CT looks worse, but pt is feeling a little better today with less pain and less nausea.  Continue IVF, antiemetics, analgesia. Trial of clear liquids today.    LOS: 6 days   Hvozdovic, Tollie Pizza PA-C 10/22/2015, Pager [phone] Mon-Fri 8a-5p [phone] after 5p, weekends, holidays    [facility] Attending   I have taken an interval history, reviewed the chart and examined the patient. I agree with the Advanced Practitioner's note, impression and recommendations.    Seems improved - CT scan changes not a surprise - likely from fluid leak. Would advance diet over time - dc when oral meds control pain and diet tolerated  GI f/u can be  made w/ Dr. Rhea Belton or one of our APPS vs seeing primary care and then [place] if needed.  Management will be treat TG, DM, no alcohol to avoid additive toxicity. Imaging f/u only needed if clinical ?'s like pain, etc  Signing off  Iva Boop, MD, [facility] [phone] (pager) [phone] after 5 PM, weekends and holidays  10/22/2015 1:14 PM

## 2015-10-23 LAB — LIPID PANEL
CHOL/HDL RATIO: 12.8 ratio
CHOLESTEROL: 179 mg/dL (ref 0–200)
HDL: 14 mg/dL — ABNORMAL LOW (ref >40)
LDL Cholesterol: 101 mg/dL — ABNORMAL HIGH (ref 0–99)
Triglycerides: 318 mg/dL — ABNORMAL HIGH (ref <150)
VLDL: 64 mg/dL — ABNORMAL HIGH (ref 0–40)

## 2015-10-23 LAB — HEPATIC FUNCTION PANEL
ALBUMIN: 2.4 g/dL — AB (ref 3.5–5.0)
ALK PHOS: 54 U/L (ref 38–126)
ALT: 15 U/L — AB (ref 17–63)
AST: 17 U/L (ref 15–41)
BILIRUBIN TOTAL: 0.8 mg/dL (ref 0.3–1.2)
Bilirubin, Direct: 0.1 mg/dL (ref 0.1–0.5)
Indirect Bilirubin: 0.7 mg/dL (ref 0.3–0.9)
Total Protein: 6 g/dL — ABNORMAL LOW (ref 6.5–8.1)

## 2015-10-23 LAB — GLUCOSE, CAPILLARY
GLUCOSE-CAPILLARY: 149 mg/dL — AB (ref 65–99)
GLUCOSE-CAPILLARY: 190 mg/dL — AB (ref 65–99)
Glucose-Capillary: 162 mg/dL — ABNORMAL HIGH (ref 65–99)
Glucose-Capillary: 167 mg/dL — ABNORMAL HIGH (ref 65–99)
Glucose-Capillary: 181 mg/dL — ABNORMAL HIGH (ref 65–99)
Glucose-Capillary: 208 mg/dL — ABNORMAL HIGH (ref 65–99)
Glucose-Capillary: 237 mg/dL — ABNORMAL HIGH (ref 65–99)

## 2015-10-23 LAB — CBC
HEMATOCRIT: 33.9 % — AB (ref 39.0–52.0)
Hemoglobin: 11 g/dL — ABNORMAL LOW (ref 13.0–17.0)
MCH: 27.4 pg (ref 26.0–34.0)
MCHC: 32.4 g/dL (ref 30.0–36.0)
MCV: 84.3 fL (ref 78.0–100.0)
Platelets: 272 10*3/uL (ref 150–400)
RBC: 4.02 MIL/uL — ABNORMAL LOW (ref 4.22–5.81)
RDW: 14 % (ref 11.5–15.5)
WBC: 9.4 10*3/uL (ref 4.0–10.5)

## 2015-10-23 LAB — LIPASE, BLOOD: LIPASE: 31 U/L (ref 11–51)

## 2015-10-23 MED ORDER — ZOLPIDEM TARTRATE 5 MG PO TABS
5.0000 mg | ORAL_TABLET | Freq: Once | ORAL | Status: AC
  Administered 2015-10-23: 5 mg via ORAL
  Filled 2015-10-23: qty 1

## 2015-10-23 MED ORDER — POTASSIUM CHLORIDE CRYS ER 20 MEQ PO TBCR
40.0000 meq | EXTENDED_RELEASE_TABLET | ORAL | Status: AC
  Administered 2015-10-23 – 2015-10-24 (×4): 40 meq via ORAL
  Filled 2015-10-23 (×4): qty 2

## 2015-10-23 MED ORDER — CIPROFLOXACIN IN D5W 400 MG/200ML IV SOLN
400.0000 mg | Freq: Two times a day (BID) | INTRAVENOUS | Status: DC
  Administered 2015-10-24 – 2015-10-26 (×6): 400 mg via INTRAVENOUS
  Filled 2015-10-23 (×7): qty 200

## 2015-10-23 MED ORDER — METRONIDAZOLE IN NACL 5-0.79 MG/ML-% IV SOLN
500.0000 mg | Freq: Three times a day (TID) | INTRAVENOUS | Status: DC
  Administered 2015-10-23 – 2015-10-26 (×9): 500 mg via INTRAVENOUS
  Filled 2015-10-23 (×10): qty 100

## 2015-10-23 NOTE — Progress Notes (Signed)
[facility] PROGRESS NOTE  JAMALL STROHMEIER GNF:[id number] DOB: Jan 29, 1991 DOA: 10/16/2015 PCP: Jeanann Lewandowsky, MD  Summary 10/23/15: I have seen and examined [name] at bedside in the presence of family members including his grandfather and brother. Appreciate GI. Shrey Day is a pleasant 25 year old obese male with uncontrolled diabetes mellitus on insulin ( last A1c >10, non-adherent to diet and non-compliant with his insulin), hypertriglyceridemia (noncompliant with medication), hypertension, GERD, molluscum contagiosum, ongoing tobacco and alcohol use who presented to the ED with nausea, vomiting and abdominal pain since the day of admission and he was found to have acute pancreatitis felt to be related to hypertriglyceridemia/binge alcohol drinking. He has SIRS with persistent fever and question is whether this is all related to pancreatitis. He still has abdominal pain. Will continue supportive management and add empiric antibiotics as possibility of superinfection. Plan Acute pancreatitis/Hyperglycemia/Nausea & vomiting/SIRS (systemic inflammatory response syndrome) (HCC)/Molluscum contagiosum/Chest pain/Fever, unspecified/Other acute pancreatitis/Abdominal pain, epigastric  Ciprofloxacin/Flagyl  Continue IV fluids/analgesics  Follow GI recommendations Diabetes (HCC)/Hypertension/DKA (diabetic ketoacidoses) (HCC)/Diabetes mellitus without complication (HCC)/Hyperkalemia/High triglycerides  Patient out of DKA  Continue current management    Code Status: Full Code Family Communication: Grandfather and brother at bedside Disposition Plan: Home   Consultants:  GI  Procedures:    Antibiotics:  Ciprofloxacin 10/23/15>  Flagyl 10/23/15>  HPI/Subjective: Still has abdominal and chest pain.  Objective: Filed Vitals:   10/23/15 1325 10/23/15 2016  BP: 134/74 142/83  Pulse: 98 94  Temp: 99.5 F (37.5 C) 100.2 F (37.9 C)  Resp: 20 20    Intake/Output  Summary (Last 24 hours) at 10/23/15 2126 Last data filed at 10/23/15 2026  Gross per 24 hour  Intake   2200 ml  Output   1400 ml  Net    800 ml   Filed Weights   10/17/15 1130 10/18/15 1625  Weight: 122.471 kg (270 lb) 121.11 kg (267 lb)    Exam:   General:  Comfortable at rest.  Cardiovascular: S1-S2 normal. No murmurs. Pulse regular.  Respiratory: Good air entry bilaterally. No rhonchi or rales.  Abdomen: Soft and nontender. Normal bowel sounds. No organomegaly.  Musculoskeletal: No pedal edema   Neurological: Intact  Data Reviewed: Basic Metabolic Panel:  Recent Labs Lab 10/18/15 0820 10/18/15 2213 10/19/15 0422 10/19/15 0733 10/19/15 2355  NA 134* 134* 135 134* 136  K 6.1* 4.0 3.7 3.9 3.7  CL 105 101 101 100* 103  CO2 15* 22 21* 20* 21*  GLUCOSE 226* 185* 167* 200* 183*  BUN CREATININE 1.00 0.76 0.71 0.77 0.82  CALCIUM 7.7* 8.2* 8.2* 8.3* 8.7*   Liver Function Tests:  Recent Labs Lab 10/21/15 0930 10/23/15 0550  AST 21 17  ALT 22 15*  ALKPHOS 53 54  BILITOT 1.0 0.8  PROT 6.9 6.0*  ALBUMIN 2.9* 2.4*    Recent Labs Lab 10/18/15 0439 10/21/15 0930 10/23/15 0550  LIPASE 48 37 31   No results for input(s): AMMONIA in the last 168 hours. CBC:  Recent Labs Lab 10/21/15 0930 10/23/15 0550  WBC 8.7 9.4  HGB 11.9* 11.0*  HCT 35.8* 33.9*  MCV 83.4 84.3  PLT 237 272   Cardiac Enzymes:  Recent Labs Lab 10/17/15 0005 10/17/15 0647 10/17/15 1400  TROPONINI <0.03 <0.03 <0.03   BNP (last 3 results) No results for input(s): BNP in the last 8760 hours.  ProBNP (last 3 results) No results for input(s): PROBNP in the last 8760 hours.  CBG:  Recent Labs Lab 10/23/15 0035 10/23/15 0404 10/23/15 0729 10/23/15 1156 10/23/15 1600  GLUCAP 237* 181* 167* 190* 208*    Recent Results (from the past 240 hour(s))  Culture, blood (x 2)     Status: None   Collection Time: 10/17/15 12:10 AM  Result Value Ref Range Status    Specimen Description BLOOD RIGHT ARM  Final   Special Requests IN PEDIATRIC BOTTLE  Final   Culture   Final    NO GROWTH 5 DAYS Performed at [facility]    Report Status 10/22/2015 FINAL  Final  Culture, blood (x 2)     Status: None   Collection Time: 10/17/15  2:05 AM  Result Value Ref Range Status   Specimen Description BLOOD BLOOD LEFT HAND  Final   Special Requests BOTTLES DRAWN AEROBIC ONLY  Final   Culture   Final    NO GROWTH 5 DAYS Performed at [facility]    Report Status 10/22/2015 FINAL  Final  Culture, blood (routine x 2)     Status: None (Preliminary result)   Collection Time: 10/18/15 10:03 PM  Result Value Ref Range Status   Specimen Description BLOOD LEFT ARM  Final   Special Requests BOTTLES DRAWN AEROBIC ONLY 6CC  Final   Culture   Final    NO GROWTH 4 DAYS Performed at [facility]    Report Status PENDING  Incomplete  Culture, blood (routine x 2)     Status: None (Preliminary result)   Collection Time: 10/18/15 10:13 PM  Result Value Ref Range Status   Specimen Description BLOOD LEFT HAND  Final   Special Requests BOTTLES DRAWN AEROBIC AND ANAEROBIC 10CC  Final   Culture   Final    NO GROWTH 4 DAYS Performed at [facility]    Report Status PENDING  Incomplete  Culture, Urine     Status: None   Collection Time: 10/18/15 11:42 PM  Result Value Ref Range Status   Specimen Description URINE, RANDOM  Final   Special Requests NONE  Final   Culture   Final    MULTIPLE SPECIES PRESENT, SUGGEST RECOLLECTION Performed at [facility]    Report Status 10/20/2015 FINAL  Final     Studies: No results found.  Scheduled Meds: . ciprofloxacin  400 mg Intravenous Q12H  . enoxaparin (LOVENOX) injection  40 mg Subcutaneous Daily  . insulin aspart  0-15 Units Subcutaneous 6 times per day  . insulin glargine  25 Units Subcutaneous Q24H  . metronidazole  500 mg Intravenous Q8H  . omega-3 acid ethyl esters  1 g Oral  BID  . pantoprazole (PROTONIX) IV  40 mg Intravenous Q24H  . potassium chloride  40 mEq Oral Q4H  . triamcinolone cream  1 application Topical BID   Continuous Infusions: . sodium chloride 150 mL/hr at 10/23/15 2027     Time spent: 25 minutes    [facility]  [facility] Pager [phone]. If 7PM-7AM, please contact night-coverage at [email or web address], password [facility] 10/23/2015, 9:26 PM  LOS: 7 days

## 2015-10-24 LAB — COMPREHENSIVE METABOLIC PANEL
ALBUMIN: 2.6 g/dL — AB (ref 3.5–5.0)
ALT: 16 U/L — ABNORMAL LOW (ref 17–63)
AST: 18 U/L (ref 15–41)
Alkaline Phosphatase: 55 U/L (ref 38–126)
Anion gap: 11 (ref 5–15)
BUN: 6 mg/dL (ref 6–20)
CHLORIDE: 104 mmol/L (ref 101–111)
CO2: 24 mmol/L (ref 22–32)
Calcium: 8.9 mg/dL (ref 8.9–10.3)
Creatinine, Ser: 0.63 mg/dL (ref 0.61–1.24)
GFR calc Af Amer: >60 mL/min (ref >60)
GFR calc non Af Amer: >60 mL/min (ref >60)
GLUCOSE: 134 mg/dL — AB (ref 65–99)
POTASSIUM: 4 mmol/L (ref 3.5–5.1)
Sodium: 139 mmol/L (ref 135–145)
Total Bilirubin: 0.6 mg/dL (ref 0.3–1.2)
Total Protein: 6.3 g/dL — ABNORMAL LOW (ref 6.5–8.1)

## 2015-10-24 LAB — GLUCOSE, CAPILLARY
GLUCOSE-CAPILLARY: 115 mg/dL — AB (ref 65–99)
GLUCOSE-CAPILLARY: 124 mg/dL — AB (ref 65–99)
GLUCOSE-CAPILLARY: 180 mg/dL — AB (ref 65–99)
Glucose-Capillary: 135 mg/dL — ABNORMAL HIGH (ref 65–99)
Glucose-Capillary: 169 mg/dL — ABNORMAL HIGH (ref 65–99)

## 2015-10-24 LAB — CBC WITH DIFFERENTIAL/PLATELET
Basophils Absolute: 0 10*3/uL (ref 0.0–0.1)
Basophils Relative: 0 %
EOS ABS: 0.1 10*3/uL (ref 0.0–0.7)
EOS PCT: 1 %
HCT: 31.9 % — ABNORMAL LOW (ref 39.0–52.0)
Hemoglobin: 10.8 g/dL — ABNORMAL LOW (ref 13.0–17.0)
LYMPHS ABS: 1.5 10*3/uL (ref 0.7–4.0)
Lymphocytes Relative: 14 %
MCH: 28 pg (ref 26.0–34.0)
MCHC: 33.9 g/dL (ref 30.0–36.0)
MCV: 82.6 fL (ref 78.0–100.0)
Monocytes Absolute: 1 10*3/uL (ref 0.1–1.0)
Monocytes Relative: 9 %
NEUTROS ABS: 8.1 10*3/uL — AB (ref 1.7–7.7)
NEUTROS PCT: 76 %
Other: 0 %
PLATELETS: 308 10*3/uL (ref 150–400)
RBC: 3.86 MIL/uL — AB (ref 4.22–5.81)
RDW: 13.5 % (ref 11.5–15.5)
WBC: 10.7 10*3/uL — AB (ref 4.0–10.5)

## 2015-10-24 LAB — MAGNESIUM: Magnesium: 1.7 mg/dL (ref 1.7–2.4)

## 2015-10-24 LAB — CULTURE, BLOOD (ROUTINE X 2)
CULTURE: NO GROWTH
CULTURE: NO GROWTH

## 2015-10-24 LAB — AMYLASE: Amylase: 50 U/L (ref 28–100)

## 2015-10-24 LAB — LIPASE, BLOOD: LIPASE: 33 U/L (ref 11–51)

## 2015-10-24 MED ORDER — ZOLPIDEM TARTRATE 5 MG PO TABS
5.0000 mg | ORAL_TABLET | Freq: Once | ORAL | Status: AC
  Administered 2015-10-24: 5 mg via ORAL
  Filled 2015-10-24: qty 1

## 2015-10-24 NOTE — Progress Notes (Signed)
Brent Costa:[id number] DOB: November 18, 1990 DOA: 10/16/2015 PCP: Jeanann Lewandowsky, MD  Summary&Daily Progress Notes 10/23/15: I have seen and examined Brent Costa at bedside in the presence of family members including his grandfather and brother. Appreciate GI. Brent Costa is a pleasant 25 year old obese male with uncontrolled diabetes mellitus on insulin ( last A1c >10, non-adherent to diet and non-compliant with his insulin), hypertriglyceridemia (noncompliant with medication), hypertension, GERD, molluscum contagiosum, ongoing tobacco and alcohol use who presented to the ED with nausea, vomiting and abdominal pain since the day of admission and he was found to have acute pancreatitis felt to be related to hypertriglyceridemia/binge alcohol drinking. He has SIRS with persistent fever and question is whether this is all related to pancreatitis. He still has abdominal pain. Will continue supportive management and add empiric antibiotics as possibility of superinfection. 10/24/15: Tolerating feeds but still has some abdominal pain. Pancreatic enzymes normal. Defervescing. Will advance diet to full liquids, discontinue IV fluids and continue antibiotics. Problem List Plan  Principal Problem:   Acute pancreatitis Active Problems:   Hyperglycemia   Nausea & vomiting   Diabetes (HCC)   Hypertension   DKA (diabetic ketoacidoses) (HCC)   Abdominal pain   Diabetes mellitus without complication (HCC)   SIRS (systemic inflammatory response syndrome) (HCC)   Molluscum contagiosum   Chest pain   Hyperkalemia   Fever, unspecified   Other acute pancreatitis   Abdominal pain, epigastric   High triglycerides   Discontinue normal saline  Advance diet to full liquids  Day 2 Ciprofloxacin  Day 2 Flagyl   Code Status: Full Code Family Communication: Grandfather and brother at bedside Disposition Plan: Home   Consultants:  GI  Procedures:    Antibiotics:  Ciprofloxacin  10/23/15>  Flagyl 10/23/15>  HPI/Subjective: Some abdominal pain but hungry.  Objective: Filed Vitals:   10/24/15 0412 10/24/15 1340  BP: 148/77 145/67  Pulse: 101 93  Temp: 99.8 F (37.7 C) 99.1 F (37.3 C)  Resp: 20 18    Intake/Output Summary (Last 24 hours) at 10/24/15 2009 Last data filed at 10/24/15 1300  Gross per 24 hour  Intake   4900 ml  Output   2100 ml  Net   2800 ml   Filed Weights   10/17/15 1130 10/18/15 1625  Weight: 122.471 kg (270 lb) 121.11 kg (267 lb)    Exam:   General:  Comfortable at rest.  Cardiovascular: S1-S2 normal. No murmurs. Pulse regular.  Respiratory: Good air entry bilaterally. No rhonchi or rales.  Abdomen: Soft and nontender. Normal bowel sounds. No organomegaly.  Musculoskeletal: No pedal edema   Neurological: Intact  Data Reviewed: Basic Metabolic Panel:  Recent Labs Lab 10/18/15 2213 10/19/15 0422 10/19/15 0733 10/19/15 2355 10/24/15 0530  NA 134* 135 134* 136 139  K 4.0 3.7 3.9 3.7 4.0  CL 101 101 100* 103 104  CO2 22 21* 20* 21* 24  GLUCOSE 185* 167* 200* 183* 134*  BUN CREATININE 0.76 0.71 0.77 0.82 0.63  CALCIUM 8.2* 8.2* 8.3* 8.7* 8.9  MG  --   --   --   --  1.7   Liver Function Tests:  Recent Labs Lab 10/21/15 0930 10/23/15 0550 10/24/15 0530  AST ALT 22 15* 16*  ALKPHOS 53 54 55  BILITOT 1.0 0.8 0.6  PROT 6.9 6.0* 6.3*  ALBUMIN 2.9* 2.4* 2.6*    Recent Labs Lab 10/18/15 0439 10/21/15 0930 10/23/15 0550 10/24/15 0530  LIPASE 48 37 31 33  AMYLASE  --   --   --  50   No results for input(s): AMMONIA in the last 168 hours. CBC:  Recent Labs Lab 10/21/15 0930 10/23/15 0550 10/24/15 0530  WBC 8.7 9.4 10.7*  NEUTROABS  --   --  8.1*  HGB 11.9* 11.0* 10.8*  HCT 35.8* 33.9* 31.9*  MCV 83.4 84.3 82.6  PLT 237 272 308   Cardiac Enzymes: No results for input(s): CKTOTAL, CKMB, CKMBINDEX, TROPONINI in the last 168 hours. BNP (last 3 results) No results for  input(s): BNP in the last 8760 hours.  ProBNP (last 3 results) No results for input(s): PROBNP in the last 8760 hours.  CBG:  Recent Labs Lab 10/23/15 2343 10/24/15 0410 10/24/15 0802 10/24/15 1214 10/24/15 1656  GLUCAP 149* 135* 115* 169* 124*    Recent Results (from the past 240 hour(s))  Culture, blood (x 2)     Status: None   Collection Time: 10/17/15 12:10 AM  Result Value Ref Range Status   Specimen Description BLOOD RIGHT ARM  Final   Special Requests IN PEDIATRIC BOTTLE  Final   Culture   Final    NO GROWTH 5 DAYS Performed at [facility]    Report Status 10/22/2015 FINAL  Final  Culture, blood (x 2)     Status: None   Collection Time: 10/17/15  2:05 AM  Result Value Ref Range Status   Specimen Description BLOOD BLOOD LEFT HAND  Final   Special Requests BOTTLES DRAWN AEROBIC ONLY  Final   Culture   Final    NO GROWTH 5 DAYS Performed at [facility]    Report Status 10/22/2015 FINAL  Final  Culture, blood (routine x 2)     Status: None   Collection Time: 10/18/15 10:03 PM  Result Value Ref Range Status   Specimen Description BLOOD LEFT ARM  Final   Special Requests BOTTLES DRAWN AEROBIC ONLY 6CC  Final   Culture   Final    NO GROWTH 5 DAYS Performed at [facility]    Report Status 10/24/2015 FINAL  Final  Culture, blood (routine x 2)     Status: None   Collection Time: 10/18/15 10:13 PM  Result Value Ref Range Status   Specimen Description BLOOD LEFT HAND  Final   Special Requests BOTTLES DRAWN AEROBIC AND ANAEROBIC 10CC  Final   Culture   Final    NO GROWTH 5 DAYS Performed at [facility]    Report Status 10/24/2015 FINAL  Final  Culture, Urine     Status: None   Collection Time: 10/18/15 11:42 PM  Result Value Ref Range Status   Specimen Description URINE, RANDOM  Final   Special Requests NONE  Final   Culture   Final    MULTIPLE SPECIES PRESENT, SUGGEST RECOLLECTION Performed at [facility]     Report Status 10/20/2015 FINAL  Final     Studies: No results found.  Scheduled Meds: . ciprofloxacin  400 mg Intravenous Q12H  . enoxaparin (LOVENOX) injection  40 mg Subcutaneous Daily  . insulin aspart  0-15 Units Subcutaneous 6 times per day  . insulin glargine  25 Units Subcutaneous Q24H  . metronidazole  500 mg Intravenous Q8H  . omega-3 acid ethyl esters  1 g Oral BID  . pantoprazole (PROTONIX) IV  40 mg Intravenous Q24H  . triamcinolone cream  1 application Topical BID   Continuous Infusions:  Time spent: 15 minutes    Adenike Shidler  [facility] Pager [phone]. If 7PM-7AM, please contact night-coverage at [email or web address], password [facility] 10/24/2015, 8:09 PM  LOS: 8 days

## 2015-10-24 NOTE — Care Management Note (Signed)
Case Management Note  Patient Details  Name: Brent Costa MRN: [id number] Date of Birth: Mar 21, 1991  Subjective/Objective:  Has appt w/[facility] on 1/20-have contacted [facility] nurse to have appt changed to a later date since no d/c today.                 Action/Plan:d/c plan home.   Expected Discharge Date:   (unknown)               Expected Discharge Plan:  Home/Self Care  In-House Referral:     Discharge planning Services  CM Consult, [facility]  Post Acute Care Choice:    Choice offered to:     DME Arranged:    DME Agency:     HH Arranged:    HH Agency:     Status of Service:  In process, will continue to follow  Medicare Important Message Given:    Date Medicare IM Given:    Medicare IM give by:    Date Additional Medicare IM Given:    Additional Medicare Important Message give by:     If discussed at Long Length of Stay Meetings, dates discussed:    Additional Comments:  Lanier Clam, RN 10/24/2015, 12:29 PM

## 2015-10-24 NOTE — Hospital Discharge Follow-Up (Signed)
This Case Manager received communication from Lanier Clam, RN CM that patient's appointment on 10/25/15 will need to be changed to a later date because patient not ready for discharge. Appointment changed to 10/31/15 at 1100 with Dr. Hyman Hopes. AVS updated.  Lanier Clam, RN CM also updated.

## 2015-10-25 ENCOUNTER — Inpatient Hospital Stay: Payer: Self-pay | Admitting: Family Medicine

## 2015-10-25 LAB — CBC
HEMATOCRIT: 35.4 % — AB (ref 39.0–52.0)
HEMOGLOBIN: 11.5 g/dL — AB (ref 13.0–17.0)
MCH: 27.4 pg (ref 26.0–34.0)
MCHC: 32.5 g/dL (ref 30.0–36.0)
MCV: 84.5 fL (ref 78.0–100.0)
Platelets: 345 10*3/uL (ref 150–400)
RBC: 4.19 MIL/uL — ABNORMAL LOW (ref 4.22–5.81)
RDW: 13.4 % (ref 11.5–15.5)
WBC: 10.6 10*3/uL — AB (ref 4.0–10.5)

## 2015-10-25 LAB — COMPREHENSIVE METABOLIC PANEL
ALBUMIN: 2.7 g/dL — AB (ref 3.5–5.0)
ALT: 16 U/L — ABNORMAL LOW (ref 17–63)
ANION GAP: 11 (ref 5–15)
AST: 18 U/L (ref 15–41)
Alkaline Phosphatase: 59 U/L (ref 38–126)
BUN: 5 mg/dL — AB (ref 6–20)
CO2: 28 mmol/L (ref 22–32)
Calcium: 8.9 mg/dL (ref 8.9–10.3)
Chloride: 98 mmol/L — ABNORMAL LOW (ref 101–111)
Creatinine, Ser: 0.62 mg/dL (ref 0.61–1.24)
GFR calc Af Amer: >60 mL/min (ref >60)
GFR calc non Af Amer: >60 mL/min (ref >60)
GLUCOSE: 154 mg/dL — AB (ref 65–99)
POTASSIUM: 4.1 mmol/L (ref 3.5–5.1)
SODIUM: 137 mmol/L (ref 135–145)
Total Bilirubin: 0.8 mg/dL (ref 0.3–1.2)
Total Protein: 6.3 g/dL — ABNORMAL LOW (ref 6.5–8.1)

## 2015-10-25 LAB — GLUCOSE, CAPILLARY
GLUCOSE-CAPILLARY: 138 mg/dL — AB (ref 65–99)
GLUCOSE-CAPILLARY: 159 mg/dL — AB (ref 65–99)
GLUCOSE-CAPILLARY: 137 mg/dL — AB (ref 65–99)
Glucose-Capillary: 186 mg/dL — ABNORMAL HIGH (ref 65–99)
Glucose-Capillary: 195 mg/dL — ABNORMAL HIGH (ref 65–99)
Glucose-Capillary: 219 mg/dL — ABNORMAL HIGH (ref 65–99)

## 2015-10-25 LAB — LIPASE, BLOOD: Lipase: 27 U/L (ref 11–51)

## 2015-10-25 MED ORDER — INSULIN GLARGINE 100 UNIT/ML ~~LOC~~ SOLN
28.0000 [IU] | SUBCUTANEOUS | Status: DC
  Administered 2015-10-26: 28 [IU] via SUBCUTANEOUS
  Filled 2015-10-25: qty 0.28

## 2015-10-25 MED ORDER — ZOLPIDEM TARTRATE 5 MG PO TABS
5.0000 mg | ORAL_TABLET | Freq: Once | ORAL | Status: AC
  Administered 2015-10-25: 5 mg via ORAL
  Filled 2015-10-25: qty 1

## 2015-10-25 NOTE — Progress Notes (Addendum)
Brent Costa:[id number] DOB: 03-31-1991 DOA: 10/16/2015 PCP: Jeanann Lewandowsky, MD  Summary&Daily Progress Notes 10/23/15: I have seen and examined [name] at bedside in the presence of family members including his grandfather and brother. Appreciate GI. Brent Costa is a pleasant 25 year old obese male with uncontrolled diabetes mellitus on insulin ( last A1c >10, non-adherent to diet and non-compliant with his insulin), hypertriglyceridemia (noncompliant with medication), hypertension, GERD, molluscum contagiosum, ongoing tobacco and alcohol use who presented to the ED with nausea, vomiting and abdominal pain since the day of admission and he was found to have acute pancreatitis felt to be related to hypertriglyceridemia/binge alcohol drinking. He has SIRS with persistent fever and question is whether this is all related to pancreatitis. He still has abdominal pain. Will continue supportive management and add empiric antibiotics as possibility of superinfection. 10/24/15: Tolerating feeds but still has some abdominal pain. Pancreatic enzymes normal. Defervescing. Will advance diet to full liquids, discontinue IV fluids and continue antibiotics. 10/25/15: Lipase remains normal. White count slightly elevated. Hungry, less abdominal pain. Will advance diet and plan d/c in am if continues to do well. Problem List Plan  Principal Problem:   Acute pancreatitis Active Problems:   Hyperglycemia   Nausea & vomiting   Diabetes (HCC)   Hypertension   DKA (diabetic ketoacidoses) (HCC)   Abdominal pain   Diabetes mellitus without complication (HCC)   SIRS (systemic inflammatory response syndrome) (HCC)   Molluscum contagiosum   Chest pain   Hyperkalemia   Fever, unspecified   Other acute pancreatitis   Abdominal pain, epigastric   High triglycerides   Day 3 Ciprofloxacin  Day 3 Flagyl  Change food consistency to regular(carb consistent)  Code Status: Full Code Family Communication:  Grandfather and brother at bedside Disposition Plan: Home tomorrow?  Consultants:  GI  Procedures:    Antibiotics:  Ciprofloxacin 10/23/15>  Flagyl 10/23/15>  HPI/Subjective: Less abdominal pain. Asks for solid food.  Objective: Filed Vitals:   10/25/15 1300 10/25/15 2050  BP: 140/72 147/74  Pulse: 101 99  Temp: 98.9 F (37.2 C) 98.6 F (37 C)  Resp: 16 16    Intake/Output Summary (Last 24 hours) at 10/25/15 2314 Last data filed at 10/25/15 1230  Gross per 24 hour  Intake    420 ml  Output      0 ml  Net    420 ml   Filed Weights   10/17/15 1130 10/18/15 1625  Weight: 122.471 kg (270 lb) 121.11 kg (267 lb)    Exam:   General:  Comfortable at rest.  Cardiovascular: S1-S2 normal. No murmurs. Pulse regular.  Respiratory: Good air entry bilaterally. No rhonchi or rales.  Abdomen: Soft and nontender. Normal bowel sounds. No organomegaly.  Musculoskeletal: No pedal edema   Neurological: Intact  Data Reviewed: Basic Metabolic Panel:  Recent Labs Lab 10/19/15 0422 10/19/15 0733 10/19/15 2355 10/24/15 0530 10/25/15 0445  NA 135 134* 136 139 137  K 3.7 3.9 3.7 4.0 4.1  CL 101 100* 103 104 98*  CO2 21* 20* 21* 24 28  GLUCOSE 167* 200* 183* 134* 154*  BUN 5*  CREATININE 0.71 0.77 0.82 0.63 0.62  CALCIUM 8.2* 8.3* 8.7* 8.9 8.9  MG  --   --   --  1.7  --    Liver Function Tests:  Recent Labs Lab 10/21/15 0930 10/23/15 0550 10/24/15 0530 10/25/15 0445  AST ALT 22 15* 16* 16*  ALKPHOS 53  54 55 59  BILITOT 1.0 0.8 0.6 0.8  PROT 6.9 6.0* 6.3* 6.3*  ALBUMIN 2.9* 2.4* 2.6* 2.7*    Recent Labs Lab 10/21/15 0930 10/23/15 0550 10/24/15 0530 10/25/15 0445  LIPASE 37 31 33 27  AMYLASE  --   --  50  --    No results for input(s): AMMONIA in the last 168 hours. CBC:  Recent Labs Lab 10/21/15 0930 10/23/15 0550 10/24/15 0530 10/25/15 0445  WBC 8.7 9.4 10.7* 10.6*  NEUTROABS  --   --  8.1*  --   HGB 11.9* 11.0*  10.8* 11.5*  HCT 35.8* 33.9* 31.9* 35.4*  MCV 83.4 84.3 82.6 84.5  PLT 237 272 308 345   Cardiac Enzymes: No results for input(s): CKTOTAL, CKMB, CKMBINDEX, TROPONINI in the last 168 hours. BNP (last 3 results) No results for input(s): BNP in the last 8760 hours.  ProBNP (last 3 results) No results for input(s): PROBNP in the last 8760 hours.  CBG:  Recent Labs Lab 10/25/15 0430 10/25/15 0709 10/25/15 1146 10/25/15 1610 10/25/15 2049  GLUCAP 138* 159* 137* 195* 219*    Recent Results (from the past 240 hour(s))  Culture, blood (x 2)     Status: None   Collection Time: 10/17/15 12:10 AM  Result Value Ref Range Status   Specimen Description BLOOD RIGHT ARM  Final   Special Requests IN PEDIATRIC BOTTLE  Final   Culture   Final    NO GROWTH 5 DAYS Performed at [facility]    Report Status 10/22/2015 FINAL  Final  Culture, blood (x 2)     Status: None   Collection Time: 10/17/15  2:05 AM  Result Value Ref Range Status   Specimen Description BLOOD BLOOD LEFT HAND  Final   Special Requests BOTTLES DRAWN AEROBIC ONLY  Final   Culture   Final    NO GROWTH 5 DAYS Performed at [facility]    Report Status 10/22/2015 FINAL  Final  Culture, blood (routine x 2)     Status: None   Collection Time: 10/18/15 10:03 PM  Result Value Ref Range Status   Specimen Description BLOOD LEFT ARM  Final   Special Requests BOTTLES DRAWN AEROBIC ONLY 6CC  Final   Culture   Final    NO GROWTH 5 DAYS Performed at [facility]    Report Status 10/24/2015 FINAL  Final  Culture, blood (routine x 2)     Status: None   Collection Time: 10/18/15 10:13 PM  Result Value Ref Range Status   Specimen Description BLOOD LEFT HAND  Final   Special Requests BOTTLES DRAWN AEROBIC AND ANAEROBIC 10CC  Final   Culture   Final    NO GROWTH 5 DAYS Performed at [facility]    Report Status 10/24/2015 FINAL  Final  Culture, Urine     Status: None   Collection Time:  10/18/15 11:42 PM  Result Value Ref Range Status   Specimen Description URINE, RANDOM  Final   Special Requests NONE  Final   Culture   Final    MULTIPLE SPECIES PRESENT, SUGGEST RECOLLECTION Performed at [facility]    Report Status 10/20/2015 FINAL  Final     Studies: No results found.  Scheduled Meds: . ciprofloxacin  400 mg Intravenous Q12H  . enoxaparin (LOVENOX) injection  40 mg Subcutaneous Daily  . insulin aspart  0-15 Units Subcutaneous 6 times per day  . insulin glargine  25  Units Subcutaneous Q24H  . metronidazole  500 mg Intravenous Q8H  . omega-3 acid ethyl esters  1 g Oral BID  . pantoprazole (PROTONIX) IV  40 mg Intravenous Q24H  . triamcinolone cream  1 application Topical BID   Continuous Infusions:    Time spent: 25 minutes    Kayliah Tindol  [facility] Pager [phone]. If 7PM-7AM, please contact night-coverage at [email or web address], password [facility] 10/25/2015, 11:14 PM  LOS: 9 days

## 2015-10-25 NOTE — Progress Notes (Signed)
Assumed care of patient at this time. Patient is stable with no complaints at this time. Agree with previously documented assessment. Will continue to monitor patient.   Arta Bruce [facility] 10/25/2015

## 2015-10-26 DIAGNOSIS — K8522 Alcohol induced acute pancreatitis with infected necrosis: Secondary | ICD-10-CM

## 2015-10-26 LAB — COMPREHENSIVE METABOLIC PANEL
ALK PHOS: 62 U/L (ref 38–126)
ALT: 15 U/L — AB (ref 17–63)
ANION GAP: 11 (ref 5–15)
AST: 18 U/L (ref 15–41)
Albumin: 2.7 g/dL — ABNORMAL LOW (ref 3.5–5.0)
BILIRUBIN TOTAL: 0.5 mg/dL (ref 0.3–1.2)
BUN: 6 mg/dL (ref 6–20)
CALCIUM: 9.1 mg/dL (ref 8.9–10.3)
CO2: 29 mmol/L (ref 22–32)
CREATININE: 0.84 mg/dL (ref 0.61–1.24)
Chloride: 99 mmol/L — ABNORMAL LOW (ref 101–111)
GFR calc non Af Amer: >60 mL/min (ref >60)
Glucose, Bld: 155 mg/dL — ABNORMAL HIGH (ref 65–99)
Potassium: 4.2 mmol/L (ref 3.5–5.1)
Sodium: 139 mmol/L (ref 135–145)
TOTAL PROTEIN: 6.5 g/dL (ref 6.5–8.1)

## 2015-10-26 LAB — CBC
HEMATOCRIT: 34.3 % — AB (ref 39.0–52.0)
HEMOGLOBIN: 11.3 g/dL — AB (ref 13.0–17.0)
MCH: 27.1 pg (ref 26.0–34.0)
MCHC: 32.9 g/dL (ref 30.0–36.0)
MCV: 82.3 fL (ref 78.0–100.0)
Platelets: 354 10*3/uL (ref 150–400)
RBC: 4.17 MIL/uL — AB (ref 4.22–5.81)
RDW: 13.2 % (ref 11.5–15.5)
WBC: 10.7 10*3/uL — ABNORMAL HIGH (ref 4.0–10.5)

## 2015-10-26 LAB — LIPASE, BLOOD: LIPASE: 42 U/L (ref 11–51)

## 2015-10-26 LAB — GLUCOSE, CAPILLARY
GLUCOSE-CAPILLARY: 160 mg/dL — AB (ref 65–99)
GLUCOSE-CAPILLARY: 180 mg/dL — AB (ref 65–99)
GLUCOSE-CAPILLARY: 244 mg/dL — AB (ref 65–99)
Glucose-Capillary: 159 mg/dL — ABNORMAL HIGH (ref 65–99)
Glucose-Capillary: 219 mg/dL — ABNORMAL HIGH (ref 65–99)

## 2015-10-26 LAB — MAGNESIUM: Magnesium: 1.7 mg/dL (ref 1.7–2.4)

## 2015-10-26 MED ORDER — INSULIN ASPART 100 UNIT/ML ~~LOC~~ SOLN: 3.0000 [IU] | Freq: Three times a day (TID) | SUBCUTANEOUS | Status: DC

## 2015-10-26 MED ORDER — LEVOFLOXACIN 750 MG PO TABS: 750.0000 mg | ORAL_TABLET | Freq: Every day | ORAL | Status: DC

## 2015-10-26 MED ORDER — TRAMADOL HCL 50 MG PO TABS: 100.0000 mg | ORAL_TABLET | Freq: Four times a day (QID) | ORAL | Status: DC | PRN

## 2015-10-26 MED ORDER — INSULIN GLARGINE 100 UNIT/ML SOLOSTAR PEN: 45.0000 [IU] | PEN_INJECTOR | Freq: Every day | SUBCUTANEOUS | Status: DC

## 2015-10-26 NOTE — Discharge Summary (Signed)
Brent Costa, is a 25 y.o. male  DOB Aug 16, 1991  MRN [id number].  Admission date:  10/16/2015  Admitting Physician  Ivor Costa, MD  Discharge Date:  10/26/2015   Primary MD  Angelica Chessman, MD  Recommendations for primary care physician for things to follow:  Optimize diabetes/hypertriglyceridemia control   Admission Diagnosis   Pancreatitis [K85.9] Abdominal pain [R10.9] Acute pancreatitis, unspecified pancreatitis type [K85.9] Non-intractable vomiting with nausea, vomiting of unspecified type [R11.2]   Discharge Diagnosis  Pancreatitis [K85.9] Abdominal pain [R10.9] Acute pancreatitis, unspecified pancreatitis type [K85.9] Non-intractable vomiting with nausea, vomiting of unspecified type [R11.2]   Active Problems:   Diabetes ([place])   Hypertension   Diabetes mellitus without complication ([place])   Molluscum contagiosum   Fever, unspecified   High triglycerides      Hospital Course  Brent Costa is a pleasant 25 year old male with morbid obesity and uncontrolled diabetes mellitus type 1 with hemoglobin A1c of 10/hypertriglyceridemia/essential hypertension/GERD/molluscum contagiosum/tobacco and alcohol abuse, who came in with nausea vomiting and abdominal pain and was found to have acute pancreatitis felt to be related to hypertriglyceridemia/binge alcohol drinking. He had evidence of SIRS. Patient was placed on IV fluids/antiemetics/analgesics/bowel rest/Levaquin and he has improved. GI help with his management. He is close to his baseline therefore will discharge to follow with his PCP in the next week. Summary&Daily Progress Notes 10/23/15: I have seen and examined Mr Haze at bedside in the presence of family members including his grandfather and brother. Appreciate GI. Devarion Eveland is a pleasant  25 year old obese male with uncontrolled diabetes mellitus on insulin ( last A1c >10, non-adherent to diet and non-compliant with his insulin), hypertriglyceridemia (noncompliant with medication), hypertension, GERD, molluscum contagiosum, ongoing tobacco and alcohol use who presented to the ED with nausea, vomiting and abdominal pain since the day of admission and he was found to have acute pancreatitis felt to be related to hypertriglyceridemia/binge alcohol drinking. He has SIRS with persistent fever and question is whether this is all related to pancreatitis. He still has abdominal pain. Will continue supportive management and add empiric antibiotics as possibility of superinfection. 10/24/15: Tolerating feeds but still has some abdominal pain. Pancreatic enzymes normal. Defervescing. Will advance diet to full liquids, discontinue IV fluids and continue antibiotics. 10/25/15: Lipase remains normal. White count slightly elevated. Hungry, less abdominal pain. Will advance diet and plan d/c in am if continues to do well. 10/26/15: Feels better. Tolerating feeds. We'll therefore discharge to follow with his PCP in the next week. He should complete 7 days of Levaquin.  Discharge Condition Stable.  Consults obtained  GI  Follow UP Follow-up Information    Follow up with [facility] On 10/31/2015.   Why:  Appointment on 10/31/15 at 12:00 pm with Dr. Doreene Burke.   Contact information:   [address] [place] [address] [phone]        Discharge Instructions  and  Discharge Medications  Discharge Instructions    Diet - low sodium heart healthy  Complete by:  As directed      Diet Carb Modified    Complete by:  As directed      Increase activity slowly    Complete by:  As directed             Medication List    TAKE these medications        ACCU-CHEK AVIVA PLUS w/Device Kit  Check blood sugars 4 times daily (before meals and at  bedtime)     accu-chek softclix lancets  Use as instructed     fenofibrate 145 MG tablet  Commonly known as:  TRICOR  Take 1 tablet (145 mg total) by mouth daily.     glucose blood test strip  Commonly known as:  ACCU-CHEK AVIVA  Use as instructed     hydrOXYzine 25 MG tablet  Commonly known as:  ATARAX/VISTARIL  Take 1 tablet (25 mg total) by mouth every 8 (eight) hours as needed for itching.     insulin aspart 100 UNIT/ML injection  Commonly known as:  novoLOG  Inject 3-10 Units into the skin 3 (three) times daily. Blood Glucose 150 - 200, give 3 units Blood Glucose 201 - 250, give 5 units Blood Glucose 251 - 300, give 7 units Blood Glucose 301 - 350, give 10 units     Insulin Glargine 100 UNIT/ML Solostar Pen  Commonly known as:  LANTUS  Inject 45 Units into the skin daily at 10 pm.     Insulin Syringe-Needle U-100 26G X 1/2" 1 ML Misc  Dispense as prescribed please     levofloxacin 750 MG tablet  Commonly known as:  LEVAQUIN  Take 1 tablet (750 mg total) by mouth daily.     lisinopril 10 MG tablet  Commonly known as:  PRINIVIL,ZESTRIL  Take 1 tablet (10 mg total) by mouth daily.     metFORMIN 500 MG tablet  Commonly known as:  GLUCOPHAGE  Take 2 tablets (1,000 mg total) by mouth 2 (two) times daily with a meal.     podofilox 0.5 % external solution  Commonly known as:  CONDYLOX  Apply topically 2 (two) times daily. For 3 consecutive days then off 4 days and repeat for 4 consecutive weeks.     traMADol 50 MG tablet  Commonly known as:  ULTRAM  Take 2 tablets (100 mg total) by mouth every 6 (six) hours as needed.     triamcinolone cream 0.1 %  Commonly known as:  KENALOG  Apply 1 application topically 2 (two) times daily.        Diet and Activity recommendation: See Discharge Instructions above  Major procedures and Radiology Reports - PLEASE review detailed and final reports for all details, in brief -    US Abdomen Complete  10/17/2015  CLINICAL DATA:   Initial evaluation for acute pancreatitis. EXAM: ABDOMEN ULTRASOUND COMPLETE COMPARISON:  Prior CT from 10/16/2015. FINDINGS: Gallbladder: No gallstones or wall thickening visualized. No sonographic Murphy sign noted by sonographer. Common bile duct: Diameter: 2.8 mm Liver: No focal lesion identified. Within normal limits in parenchymal echogenicity. IVC: No abnormality visualized. Pancreas: Not well visualized, with known inflammatory changes not well seen. Probable duodenal wall thickening noted, similar to previous CT. Spleen: Size and appearance within normal limits. Right Kidney: Length: 12.8 cm. Echogenicity within normal limits. No mass or hydronephrosis visualized. Left Kidney: Length: 14.0 cm. Echogenicity within normal limits. No mass or hydronephrosis visualized. Abdominal aorta: No aneurysm visualized. Other findings: None. IMPRESSION: 1. Poor  visualization of known inflammatory changes within the pancreas, although duodenal wall thickening appears grossly similar relative to recent CT. 2. Otherwise normal abdominal ultrasound. No cholelithiasis, choledocholithiasis, or biliary dilatation. Electronically Signed   By: Jeannine Boga M.D.   On: 10/17/2015 01:00   Ct Abdomen Pelvis W Contrast  10/21/2015  CLINICAL DATA:  Followup exam for pancreatitis. Patient with continued abdominal pain and nausea. EXAM: CT ABDOMEN AND PELVIS WITH CONTRAST TECHNIQUE: Multidetector CT imaging of the abdomen and pelvis was performed using the standard protocol following bolus administration of intravenous contrast. CONTRAST:  197m OMNIPAQUE IOHEXOL 300 MG/ML  SOLN COMPARISON:  Abdomen and pelvis CTs, 10/16/2015 and 11/18/2013. FINDINGS: Lung bases: Increased patchy and linear opacity at the right lung base when compared the prior study consistent with increased atelectasis. Heart normal in size. Pancreas: Extensive peripancreatic inflammatory change and fluid extends along the small bowel mesentery and involves  the omentum, courses along the gastrohepatic ligament and along the anterior para renal fascia on the right. Inflammatory changes have worsened when compared to the prior CT. Small amount of ascites collects in the pelvis. Pancreas shows diffuse enhancement. The superior mesenteric vein, portal vein and splenic vein remain patent. Liver:  Mildly enlarged.  No liver mass or focal lesion. Spleen:  Unremarkable. Gallbladder and biliary tree: There is some increased attenuation in the gallbladder without a discrete shadowing stone. No bile duct dilation. Adrenal glands:  Unremarkable. Kidneys, ureters, bladder:  Unremarkable. Lymph nodes:  No adenopathy. Gastrointestinal: Inflammatory changes surround the distal stomach and duodenum. Stomach and duodenum otherwise unremarkable. Normal small bowel. Inflammatory changes also contacts the right colon and transverse colon. Colon otherwise unremarkable. Normal appendix visualized. Musculoskeletal:  Unremarkable. IMPRESSION: 1. Worsened pancreatic inflammation when compared to the prior study. There is greater peripancreatic fluid and inflammatory change. A small amount of ascites has developed since the prior study. 2. No evidence of pancreatic necrosis. No evidence of focal collection to suggest an abscess or pseudocyst. No evidence of venous thrombosis. 3. Increased right lung base atelectasis. Persistent, mild hepatomegaly. Electronically Signed   By: DLajean ManesM.D.   On: 10/21/2015 13:37   Ct Abdomen Pelvis W Contrast  10/16/2015  CLINICAL DATA:  Awoke today with severe mid abdominal pain, nausea and vomiting. EXAM: CT ABDOMEN AND PELVIS WITH CONTRAST TECHNIQUE: Multidetector CT imaging of the abdomen and pelvis was performed using the standard protocol following bolus administration of intravenous contrast. CONTRAST:  25mOMNIPAQUE IOHEXOL 300 MG/ML SOLN, 10069mMNIPAQUE IOHEXOL 300 MG/ML SOLN COMPARISON:  CT 11/18/2013 FINDINGS: Lower chest:  Mild dependent  atelectasis in the right lower lobe. Liver: Prominent size. Diffusely decreased hepatic density consistent with steatosis. No focal lesion. Hepatobiliary: Gallbladder physiologically distended, no calcified stone. No biliary dilatation. Pancreas: Marked soft tissue stranding about the head of the pancreas and in the retroperitoneum. No ductal dilatation. Spleen: Normal in size. Adrenal glands: No nodule. Kidneys: Symmetric renal enhancement. No hydronephrosis. No perinephric stranding. Stomach/Bowel: The stomach is decompressed. There is marked inflammatory change about the duodenum with associated wall thickening. Diffuse soft tissue stranding is seen. Small amount of fluid is scan tracking anterior to the left perirenal fascia. No extraluminal air. Multiple adjacent lymph nodes in the mesentery. Remaining small bowel loops are normal. Small volume of colonic stool. The appendix is normal. Vascular/Lymphatic: No retroperitoneal adenopathy. Abdominal aorta is normal in caliber. 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 A normal right IVC is  demonstrated. Reproductive: Prostate gland normal in size. Bladder: Decompressed.  No pelvic free flow Other: No free air or perforation. Musculoskeletal: There are no acute or suspicious osseous abnormalities. IMPRESSION: 1. Inflammatory changes in the retroperitoneum adjacent to the duodenum and pancreatic head. The overall distribution is similar to that of prior CT, however the degree of inflammation has increased. Differential considerations include duodenitis including peptic ulcer disease versus pancreatitis, possible groove pancreatitis. 2. Hepatic steatosis again seen. Electronically Signed   By: Jeb Levering M.D.   On: 10/16/2015 22:34   Dg Chest Port 1 View  10/18/2015  CLINICAL DATA:  25 year old male with fever and weakness EXAM: PORTABLE CHEST 1 VIEW COMPARISON:  Radiograph dated 12/22/2013  FINDINGS: Single portable view of the chest does not demonstrate a focal consolidation. There is no pleural effusion or pneumothorax. The cardiac silhouette is within normal limits. The osseous structures appear unremarkable. IMPRESSION: No active disease. Electronically Signed   By: Anner Crete M.D.   On: 10/18/2015 21:39    Micro Results   Recent Results (from the past 240 hour(s))  Culture, blood (x 2)     Status: None   Collection Time: 10/17/15 12:10 AM  Result Value Ref Range Status   Specimen Description BLOOD RIGHT ARM  Final   Special Requests IN PEDIATRIC BOTTLE 3ML  Final   Culture   Final    NO GROWTH 5 DAYS Performed at [facility]    Report Status 10/22/2015 FINAL  Final  Culture, blood (x 2)     Status: None   Collection Time: 10/17/15  2:05 AM  Result Value Ref Range Status   Specimen Description BLOOD BLOOD LEFT HAND  Final   Special Requests BOTTLES DRAWN AEROBIC ONLY 5ML  Final   Culture   Final    NO GROWTH 5 DAYS Performed at [facility]    Report Status 10/22/2015 FINAL  Final  Culture, blood (routine x 2)     Status: None   Collection Time: 10/18/15 10:03 PM  Result Value Ref Range Status   Specimen Description BLOOD LEFT ARM  Final   Special Requests BOTTLES DRAWN AEROBIC ONLY [place]  Final   Culture   Final    NO GROWTH 5 DAYS Performed at [facility]    Report Status 10/24/2015 FINAL  Final  Culture, blood (routine x 2)     Status: None   Collection Time: 10/18/15 10:13 PM  Result Value Ref Range Status   Specimen Description BLOOD LEFT HAND  Final   Special Requests BOTTLES DRAWN AEROBIC AND ANAEROBIC 10CC  Final   Culture   Final    NO GROWTH 5 DAYS Performed at [facility]    Report Status 10/24/2015 FINAL  Final  Culture, Urine     Status: None   Collection Time: 10/18/15 11:42 PM  Result Value Ref Range Status   Specimen Description URINE, RANDOM  Final   Special Requests NONE  Final   Culture   Final      MULTIPLE SPECIES PRESENT, SUGGEST RECOLLECTION Performed at [facility]    Report Status 10/20/2015 FINAL  Final       Today   Subjective:   Hortense Ramal today has no headache,no chest abdominal pain,no new weakness tingling or numbness, feels much better wants to go home today.   Objective:   Blood pressure 135/64, pulse 86, temperature 99 F (37.2 C), temperature source Oral, resp. rate 16, height _0  (1.753  m), weight 121.11 kg (267 lb), SpO2 99 %.   Intake/Output Summary (Last 24 hours) at 10/26/15 1552 Last data filed at 10/26/15 0700  Gross per 24 hour  Intake    700 ml  Output      0 ml  Net    700 ml    Exam Awake Alert, Oriented x 3, No new F.N deficits, Normal affect [place].AT,PERRAL Supple Neck,No JVD, No cervical lymphadenopathy appriciated.  Symmetrical Chest wall movement, Good air movement bilaterally, CTAB RRR,No Gallops,Rubs or new Murmurs, No Parasternal Heave +ve B.Sounds, Abd Soft, Non tender, No organomegaly appriciated, No rebound -guarding or rigidity. No Cyanosis, Clubbing or edema, No new Rash or bruise  Data Review   CBC w Diff: Lab Results  Component Value Date   WBC 10.7* 10/26/2015   HGB 11.3* 10/26/2015   HCT 34.3* 10/26/2015   PLT 354 10/26/2015   LYMPHOPCT 14 10/24/2015   MONOPCT 9 10/24/2015   EOSPCT 1 10/24/2015   BASOPCT 0 10/24/2015    CMP: Lab Results  Component Value Date   NA 139 10/26/2015   K 4.2 10/26/2015   CL 99* 10/26/2015   CO2 29 10/26/2015   BUN 6 10/26/2015   CREATININE 0.84 10/26/2015   CREATININE 0.95 06/25/2015   PROT 6.5 10/26/2015   ALBUMIN 2.7* 10/26/2015   BILITOT 0.5 10/26/2015   ALKPHOS 62 10/26/2015   AST 18 10/26/2015   ALT 15* 10/26/2015  .   Total Time in preparing paper work, data evaluation and todays exam - 25 minutes  Estell Dillinger M.D on 10/26/2015 at [place]  [phone]

## 2015-10-28 ENCOUNTER — Encounter (HOSPITAL_COMMUNITY): Payer: Self-pay | Admitting: Emergency Medicine

## 2015-10-28 ENCOUNTER — Emergency Department (HOSPITAL_COMMUNITY)
Admission: EM | Admit: 2015-10-28 | Discharge: 2015-10-28 | Discharge status: Against Medical Advice | Payer: Self-pay | Attending: Emergency Medicine | Admitting: Emergency Medicine

## 2015-10-28 DIAGNOSIS — E109 Type 1 diabetes mellitus without complications: Secondary | ICD-10-CM | POA: Insufficient documentation

## 2015-10-28 DIAGNOSIS — I10 Essential (primary) hypertension: Secondary | ICD-10-CM | POA: Insufficient documentation

## 2015-10-28 DIAGNOSIS — R1011 Right upper quadrant pain: Secondary | ICD-10-CM | POA: Insufficient documentation

## 2015-10-28 DIAGNOSIS — R112 Nausea with vomiting, unspecified: Secondary | ICD-10-CM | POA: Insufficient documentation

## 2015-10-28 LAB — CBC
HEMATOCRIT: 40 % (ref 39.0–52.0)
Hemoglobin: 13.5 g/dL (ref 13.0–17.0)
MCH: 27.6 pg (ref 26.0–34.0)
MCHC: 33.8 g/dL (ref 30.0–36.0)
MCV: 81.6 fL (ref 78.0–100.0)
Platelets: 508 10*3/uL — ABNORMAL HIGH (ref 150–400)
RBC: 4.9 MIL/uL (ref 4.22–5.81)
RDW: 12.9 % (ref 11.5–15.5)
WBC: 7.8 10*3/uL (ref 4.0–10.5)

## 2015-10-28 LAB — URINE MICROSCOPIC-ADD ON: BACTERIA UA: NONE SEEN

## 2015-10-28 LAB — URINALYSIS, ROUTINE W REFLEX MICROSCOPIC
GLUCOSE, UA: NEGATIVE mg/dL
Ketones, ur: 15 mg/dL — AB
LEUKOCYTES UA: NEGATIVE
Nitrite: NEGATIVE
PH: 5.5 (ref 5.0–8.0)
Protein, ur: 30 mg/dL — AB
Specific Gravity, Urine: 1.028 (ref 1.005–1.030)

## 2015-10-28 LAB — COMPREHENSIVE METABOLIC PANEL
ALT: 13 U/L — ABNORMAL LOW (ref 17–63)
AST: 26 U/L (ref 15–41)
Albumin: 3.3 g/dL — ABNORMAL LOW (ref 3.5–5.0)
Alkaline Phosphatase: 65 U/L (ref 38–126)
Anion gap: 11 (ref 5–15)
BUN: 6 mg/dL (ref 6–20)
CHLORIDE: 97 mmol/L — AB (ref 101–111)
CO2: 27 mmol/L (ref 22–32)
Calcium: 9.7 mg/dL (ref 8.9–10.3)
Creatinine, Ser: 0.96 mg/dL (ref 0.61–1.24)
Glucose, Bld: 282 mg/dL — ABNORMAL HIGH (ref 65–99)
POTASSIUM: 5.1 mmol/L (ref 3.5–5.1)
Sodium: 135 mmol/L (ref 135–145)
Total Bilirubin: 1.2 mg/dL (ref 0.3–1.2)
Total Protein: 7.2 g/dL (ref 6.5–8.1)

## 2015-10-28 LAB — LIPASE, BLOOD: LIPASE: 52 U/L — AB (ref 11–51)

## 2015-10-28 NOTE — ED Notes (Signed)
Pt reports right upper abdominal pain onset last week with nausea and emesis , denies diarrhea , no fever or chills.

## 2015-10-28 NOTE — ED Notes (Signed)
Patient left without being seen.

## 2015-10-31 ENCOUNTER — Encounter: Payer: Self-pay | Admitting: Internal Medicine

## 2015-10-31 ENCOUNTER — Ambulatory Visit: Payer: Self-pay | Attending: Internal Medicine | Admitting: Internal Medicine

## 2015-10-31 VITALS — BP 127/75 | HR 96 | Temp 98.2°F | Resp 18 | Ht 69.0 in | Wt 246.0 lb

## 2015-10-31 DIAGNOSIS — Z0000 Encounter for general adult medical examination without abnormal findings: Secondary | ICD-10-CM

## 2015-10-31 DIAGNOSIS — R21 Rash and other nonspecific skin eruption: Secondary | ICD-10-CM

## 2015-10-31 DIAGNOSIS — K219 Gastro-esophageal reflux disease without esophagitis: Secondary | ICD-10-CM

## 2015-10-31 DIAGNOSIS — I10 Essential (primary) hypertension: Secondary | ICD-10-CM

## 2015-10-31 DIAGNOSIS — E119 Type 2 diabetes mellitus without complications: Secondary | ICD-10-CM

## 2015-10-31 LAB — GLUCOSE, POCT (MANUAL RESULT ENTRY): POC GLUCOSE: 276 mg/dL — AB (ref 70–99)

## 2015-10-31 MED ORDER — OMEPRAZOLE 40 MG PO CPDR: 40.0000 mg | DELAYED_RELEASE_CAPSULE | Freq: Every day | ORAL | Status: DC

## 2015-10-31 MED ORDER — INSULIN ASPART 100 UNIT/ML ~~LOC~~ SOLN
3.0000 [IU] | Freq: Three times a day (TID) | SUBCUTANEOUS | Status: DC
Start: 2015-10-31 — End: 2015-12-02

## 2015-10-31 MED ORDER — INSULIN GLARGINE 100 UNIT/ML SOLOSTAR PEN: 45.0000 [IU] | PEN_INJECTOR | Freq: Every day | SUBCUTANEOUS | Status: DC

## 2015-10-31 MED ORDER — GI COCKTAIL ~~LOC~~
30.0000 mL | Freq: Once | ORAL | Status: AC
  Administered 2015-10-31: 30 mL via ORAL

## 2015-10-31 MED FILL — OMEPRAZOLE DR 40 MG CAPSULE: 40 | 30 days supply | Qty: 30 | Fill #0

## 2015-10-31 NOTE — Progress Notes (Signed)
Patient here for hospital followup. Patient hospitalized for 10 days due to diabetic complications and pancreatitis. Patient reports still having pain from pancreatitis. Pain in right abdomen,patient reports is swollen, currently at level 3, described as constant, dull.   Current blood glucose is 277. Patient has not had insulin today and reports eating or drinking nothing after 8pm last night.  Yesterday sugar was 176.  Patient does not have any novolog for 1 week. Patient only has 1 lantus pen left.   Patient reports having heartburn currently, "I have indigestion real bad".

## 2015-10-31 NOTE — Patient Instructions (Signed)
Diabetes and Exercise Exercising regularly is important. It is not just about losing weight. It has many health benefits, such as:  Improving your overall fitness, flexibility, and endurance.  Increasing your bone density.  Helping with weight control.  Decreasing your body fat.  Increasing your muscle strength.  Reducing stress and tension.  Improving your overall health. People with diabetes who exercise gain additional benefits because exercise:  Reduces appetite.  Improves the body's use of blood sugar (glucose).  Helps lower or control blood glucose.  Decreases blood pressure.  Helps control blood lipids (such as cholesterol and triglycerides).  Improves the body's use of the hormone insulin by:  Increasing the body's insulin sensitivity.  Reducing the body's insulin needs.  Decreases the risk for heart disease because exercising:  Lowers cholesterol and triglycerides levels.  Increases the levels of good cholesterol (such as high-density lipoproteins [HDL]) in the body.  Lowers blood glucose levels. YOUR ACTIVITY PLAN  Choose an activity that you enjoy, and set realistic goals. To exercise safely, you should begin practicing any new physical activity slowly, and gradually increase the intensity of the exercise over time. Your health care provider or diabetes educator can help create an activity plan that works for you. General recommendations include:  Encouraging children to engage in at least 60 minutes of physical activity each day.  Stretching and performing strength training exercises, such as yoga or weight lifting, at least 2 times per week.  Performing a total of at least 150 minutes of moderate-intensity exercise each week, such as brisk walking or water aerobics.  Exercising at least 3 days per week, making sure you allow no more than 2 consecutive days to pass without exercising.  Avoiding long periods of inactivity (90 minutes or more). When you  have to spend an extended period of time sitting down, take frequent breaks to walk or stretch. RECOMMENDATIONS FOR EXERCISING WITH TYPE 1 OR TYPE 2 DIABETES   Check your blood glucose before exercising. If blood glucose levels are greater than 240 mg/dL, check for urine ketones. Do not exercise if ketones are present.  Avoid injecting insulin into areas of the body that are going to be exercised. For example, avoid injecting insulin into:  The arms when playing tennis.  The legs when jogging.  Keep a record of:  Food intake before and after you exercise.  Expected peak times of insulin action.  Blood glucose levels before and after you exercise.  The type and amount of exercise you have done.  Review your records with your health care provider. Your health care provider will help you to develop guidelines for adjusting food intake and insulin amounts before and after exercising.  If you take insulin or oral hypoglycemic agents, watch for signs and symptoms of hypoglycemia. They include:  Dizziness.  Shaking.  Sweating.  Chills.  Confusion.  Drink plenty of water while you exercise to prevent dehydration or heat stroke. Body water is lost during exercise and must be replaced.  Talk to your health care provider before starting an exercise program to make sure it is safe for you. Remember, almost any type of activity is better than none.   This information is not intended to replace advice given to you by your health care provider. Make sure you discuss any questions you have with your health care provider.   Document Released: 12/12/2003 Document Revised: 02/05/2015 Document Reviewed: 02/28/2013 Elsevier Interactive Patient Education 2016 [employer] Basic Carbohydrate Counting for Diabetes Mellitus Carbohydrate counting   is a method for keeping track of the amount of carbohydrates you eat. Eating carbohydrates naturally increases the level of sugar (glucose) in your  blood, so it is important for you to know the amount that is okay for you to have in every meal. Carbohydrate counting helps keep the level of glucose in your blood within normal limits. The amount of carbohydrates allowed is different for every person. A dietitian can help you calculate the amount that is right for you. Once you know the amount of carbohydrates you can have, you can count the carbohydrates in the foods you want to eat. Carbohydrates are found in the following foods:  Grains, such as breads and cereals.  Dried beans and soy products.  Starchy vegetables, such as potatoes, peas, and corn.  Fruit and fruit juices.  Milk and yogurt.  Sweets and snack foods, such as cake, cookies, candy, chips, soft drinks, and fruit drinks. CARBOHYDRATE COUNTING There are two ways to count the carbohydrates in your food. You can use either of the methods or a combination of both. Reading the "Nutrition Facts" on Packaged Food The "Nutrition Facts" is an area that is included on the labels of almost all packaged food and beverages in the [place]. It includes the serving size of that food or beverage and information about the nutrients in each serving of the food, including the grams (g) of carbohydrate per serving.  Decide the number of servings of this food or beverage that you will be able to eat or drink. Multiply that number of servings by the number of grams of carbohydrate that is listed on the label for that serving. The total will be the amount of carbohydrates you will be having when you eat or drink this food or beverage. Learning Standard Serving Sizes of Food When you eat food that is not packaged or does not include "Nutrition Facts" on the label, you need to measure the servings in order to count the amount of carbohydrates.A serving of most carbohydrate-rich foods contains about 15 g of carbohydrates. The following list includes serving sizes of carbohydrate-rich foods that  provide 15 g ofcarbohydrate per serving:   1 slice of bread (1 oz) or 1 six-inch tortilla.    of a hamburger bun or English muffin.  4-6 crackers.   cup unsweetened dry cereal.    cup hot cereal.   cup rice or pasta.    cup mashed potatoes or  of a large baked potato.  1 cup fresh fruit or one small piece of fruit.    cup canned or frozen fruit or fruit juice.  1 cup milk.   cup plain fat-free yogurt or yogurt sweetened with artificial sweeteners.   cup cooked dried beans or starchy vegetable, such as peas, corn, or potatoes.  Decide the number of standard-size servings that you will eat. Multiply that number of servings by 15 (the grams of carbohydrates in that serving). For example, if you eat 2 cups of strawberries, you will have eaten 2 servings and 30 g of carbohydrates (2 servings x 15 g = 30 g). For foods such as soups and casseroles, in which more than one food is mixed in, you will need to count the carbohydrates in each food that is included. EXAMPLE OF CARBOHYDRATE COUNTING Sample Dinner  3 oz chicken breast.   cup of brown rice.   cup of corn.  1 cup milk.   1 cup strawberries with sugar-free whipped topping.  Carbohydrate Calculation Step   1: Identify the foods that contain carbohydrates:   Rice.   Corn.   Milk.   Strawberries. Step 2:Calculate the number of servings eaten of each:   2 servings of rice.   1 serving of corn.   1 serving of milk.   1 serving of strawberries. Step 3: Multiply each of those number of servings by 15 g:   2 servings of rice x 15 g = 30 g.   1 serving of corn x 15 g = 15 g.   1 serving of milk x 15 g = 15 g.   1 serving of strawberries x 15 g = 15 g. Step 4: Add together all of the amounts to find the total grams of carbohydrates eaten: 30 g + 15 g + 15 g + 15 g = 75 g.   This information is not intended to replace advice given to you by your health care provider. Make sure you  discuss any questions you have with your health care provider.   Document Released: 09/21/2005 Document Revised: 10/12/2014 Document Reviewed: 08/18/2013 Elsevier Interactive Patient Education 2016 Elsevier Inc.  

## 2015-10-31 NOTE — Progress Notes (Signed)
Patient ID: Brent Costa, male   DOB: 1991-03-08, 26 y.o.   MRN: [id number]   Brent Costa, is a 25 y.o. male  [id number]  [id number]  DOB - [birth year]  Chief Complaint  Patient presents with  . Hospitalization Follow-up        Subjective:   Brent Costa is a 25 y.o. male with history of type 1 diabetes mellitus insulin-dependent, hypertension, hypertriglyceridemia, GERD and skin rashes here today for hospital follow up visit. Patient was recently admitted to the hospital for DKA, was appropriately managed and discharged on insulin to be followed up in our clinic today. Patient has been doing well since discharge from the hospital, compliant with insulin regimen, now on Lantus insulin 45 units daily at bedtime and NovoLog to scale. Patient has no new complaints today. Patient noticed skin eruption about 3 months ago which has remained the same despite using a prescribed cream, rash is generalized, no fever. Patient has no contact with anyone who has similar rashes. Patient has no urinary symptoms today. He has gastritis and requesting something for epigastric pain today. Patient has No headache, No chest pain, No Nausea, No new weakness tingling or numbness, No Cough - SOB. He is an ex-smoker. He denies the use of illicit drugs. He does not drink alcohol.  Problem  Essential Hypertension  Gastroesophageal Reflux Disease Without Esophagitis  Rash and Nonspecific Skin Eruption    ALLERGIES: No Known Allergies  PAST MEDICAL HISTORY: Past Medical History  Diagnosis Date  . Hypertension   . Chronic bronchitis ([place])     "get it about q year" (11/20/2013)  . DM type 1 (diabetes mellitus, type 1) ([place]) 11/2013    , went straight to Insulin.   Marland Kitchen Hypertriglyceridemia   . Microalbuminuria   . Molluscum contagiosum 06/2015    upper extremities.   . Hepatic steatosis 11/2013    seen on CT, LFTs normal. Rare if any ETOH  . Pancreatitis     MEDICATIONS AT HOME: Prior to  Admission medications   Medication Sig Start Date End Date Taking? Authorizing Provider  Blood Glucose Monitoring Suppl (ACCU-CHEK AVIVA PLUS) W/DEVICE KIT Check blood sugars 4 times daily (before meals and at bedtime) 12/20/14  Yes Tresa Garter, MD  fenofibrate (TRICOR) 145 MG tablet Take 1 tablet (145 mg total) by mouth daily. 05/22/15  Yes Arnoldo Morale, MD  glucose blood (ACCU-CHEK AVIVA) test strip Use as instructed 12/20/14  Yes Tresa Garter, MD  Insulin Glargine (LANTUS) 100 UNIT/ML Solostar Pen Inject 45 Units into the skin daily at 10 pm. 10/31/15  Yes Tresa Garter, MD  Insulin Syringe-Needle U-100 26G X 1/2" 1 ML MISC Dispense as prescribed please 04/09/14  Yes Tresa Garter, MD  Lancet Devices [facility]) lancets Use as instructed 12/20/14  Yes Tresa Garter, MD  lisinopril (PRINIVIL,ZESTRIL) 10 MG tablet Take 1 tablet (10 mg total) by mouth daily. 06/25/15  Yes Arnoldo Morale, MD  metFORMIN (GLUCOPHAGE) 500 MG tablet Take 2 tablets (1,000 mg total) by mouth 2 (two) times daily with a meal. 06/25/15  Yes Arnoldo Morale, MD  triamcinolone cream (KENALOG) 0.1 % Apply 1 application topically 2 (two) times daily. 05/22/15  Yes Arnoldo Morale, MD  hydrOXYzine (ATARAX/VISTARIL) 25 MG tablet Take 1 tablet (25 mg total) by mouth every 8 (eight) hours as needed for itching. Patient not taking: Reported on 10/31/2015 05/15/15   Ashley Murrain, NP  insulin aspart (NOVOLOG) 100 UNIT/ML injection Inject 3-10 Units into  the skin 3 (three) times daily. Blood Glucose 150 - 200, give 3 units Blood Glucose 201 - 250, give 5 units Blood Glucose 251 - 300, give 7 units Blood Glucose 301 - 350, give 10 units 10/31/15   Tresa Garter, MD  levofloxacin (LEVAQUIN) 750 MG tablet Take 1 tablet (750 mg total) by mouth daily. Patient not taking: Reported on 10/31/2015 10/26/15   Nat Math, MD  omeprazole (PRILOSEC) 40 MG capsule Take 1 capsule (40 mg total) by mouth daily. 10/31/15    Tresa Garter, MD  podofilox (CONDYLOX) 0.5 % external solution Apply topically 2 (two) times daily. For 3 consecutive days then off 4 days and repeat for 4 consecutive weeks. Patient not taking: Reported on 10/16/2015 06/25/15   Arnoldo Morale, MD  traMADol (ULTRAM) 50 MG tablet Take 2 tablets (100 mg total) by mouth every 6 (six) hours as needed. Patient not taking: Reported on 10/31/2015 10/26/15   Nat Math, MD     Objective:   Filed Vitals:   10/31/15 1128  BP: 127/75  Pulse: 96  Temp: 98.2 F (36.8 C)  TempSrc: Oral  Resp: 18  Height: '5\' 9"'  (1.753 m)  Weight: 246 lb (111.585 kg)  SpO2: 97%    Exam General appearance : Awake, alert, not in any distress. Speech Clear. Not toxic looking HEENT: Atraumatic and Normocephalic, pupils equally reactive to light and accomodation Neck: supple, no JVD. No cervical lymphadenopathy.  Chest:Good air entry bilaterally, no added sounds  CVS: S1 S2 regular, no murmurs.  Abdomen: Bowel sounds present, Non tender and not distended with no gaurding, rigidity or rebound. Extremities: B/L Lower Ext shows no edema, both legs are warm to touch Neurology: Awake alert, and oriented X 3, CN II-XII intact, Non focal Skin: Generalized papular rashes, different sizes, slightly erythematous, some with umbilication, looks like molluscum Contagiosum, or some VIRAL ERUPTION  Data Review Lab Results  Component Value Date   HGBA1C 12.4* 10/17/2015   HGBA1C 11.80 05/22/2015   HGBA1C 10.30 12/06/2014     Assessment & Plan   1. Diabetes mellitus without complication (HCC)  - Glucose (CBG) - Insulin Glargine (LANTUS) 100 UNIT/ML Solostar Pen; Inject 45 Units into the skin daily at 10 pm.  Dispense: 15 pen; Refill: 3 - insulin aspart (NOVOLOG) 100 UNIT/ML injection; Inject 3-10 Units into the skin 3 (three) times daily. Blood Glucose 150 - 200, give 3 units Blood Glucose 201 - 250, give 5 units Blood Glucose 251 - 300, give 7 units Blood Glucose  301 - 350, give 10 units  Dispense: 4 vial; Refill: 3  Aim for 30 minutes of exercise most days. Rethink what you drink. Water is great! Aim for 2-3 Carb Choices per meal (30-45 grams) +/- 1 either way  Aim for 0-15 Carbs per snack if hungry  Include protein in moderation with your meals and snacks  Consider reading food labels for Total Carbohydrate and Fat Grams of foods  Consider checking BG at alternate times per day  Continue taking medication as directed Be mindful about how much sugar you are adding to beverages and other foods. Fruit Punch - find one with no sugar  Measure and decrease portions of carbohydrate foods  Make your plate and don't go back for seconds   2. Essential hypertension  We have discussed target BP range and blood pressure goal. I have advised patient to check BP regularly and to call us back or report to clinic if the numbers are consistently  higher than 140/90. We discussed the importance of compliance with medical therapy and DASH diet recommended, consequences of uncontrolled hypertension discussed.   - continue current BP medications   3. Gastroesophageal reflux disease without esophagitis  - omeprazole (PRILOSEC) 40 MG capsule; Take 1 capsule (40 mg total) by mouth daily.  Dispense: 30 capsule; Refill: 3  4. Rash and nonspecific skin eruption: Likely Viral Infection  - Ambulatory referral to Dermatology  Patient have been counseled extensively about nutrition and exercise  Return in about 3 months (around 01/29/2016) for Hemoglobin A1C and Follow up, DM, skin rash.  The patient was given clear instructions to go to ER or return to medical center if symptoms don't improve, worsen or new problems develop. The patient verbalized understanding. The patient was told to call to get lab results if they haven't heard anything in the next week.   This note has been created with Surveyor, quantity. Any  transcriptional errors are unintentional.    Angelica Chessman, MD, [place], [place], [place], [place] and [place], [place]   10/31/2015, 12:10 PM

## 2015-11-01 MED FILL — $LANTUS SOLOSTAR 100 UNITS/: 100 | 30 days supply | Qty: 15 | Fill #0

## 2015-11-06 ENCOUNTER — Emergency Department (HOSPITAL_COMMUNITY)
Admission: EM | Admit: 2015-11-06 | Discharge: 2015-11-06 | Disposition: A | Discharge status: Home / Self Care | Payer: Self-pay | Attending: Emergency Medicine | Admitting: Emergency Medicine

## 2015-11-06 ENCOUNTER — Emergency Department (HOSPITAL_COMMUNITY): Payer: Self-pay

## 2015-11-06 ENCOUNTER — Encounter (HOSPITAL_COMMUNITY): Payer: Self-pay | Admitting: Family Medicine

## 2015-11-06 DIAGNOSIS — K863 Pseudocyst of pancreas: Secondary | ICD-10-CM | POA: Insufficient documentation

## 2015-11-06 DIAGNOSIS — E781 Pure hyperglyceridemia: Secondary | ICD-10-CM | POA: Insufficient documentation

## 2015-11-06 DIAGNOSIS — Z794 Long term (current) use of insulin: Secondary | ICD-10-CM | POA: Insufficient documentation

## 2015-11-06 DIAGNOSIS — Z7984 Long term (current) use of oral hypoglycemic drugs: Secondary | ICD-10-CM | POA: Insufficient documentation

## 2015-11-06 DIAGNOSIS — E109 Type 1 diabetes mellitus without complications: Secondary | ICD-10-CM | POA: Insufficient documentation

## 2015-11-06 DIAGNOSIS — I10 Essential (primary) hypertension: Secondary | ICD-10-CM | POA: Insufficient documentation

## 2015-11-06 DIAGNOSIS — Z79899 Other long term (current) drug therapy: Secondary | ICD-10-CM | POA: Insufficient documentation

## 2015-11-06 DIAGNOSIS — Z8709 Personal history of other diseases of the respiratory system: Secondary | ICD-10-CM | POA: Insufficient documentation

## 2015-11-06 DIAGNOSIS — Z87891 Personal history of nicotine dependence: Secondary | ICD-10-CM | POA: Insufficient documentation

## 2015-11-06 DIAGNOSIS — R1013 Epigastric pain: Secondary | ICD-10-CM

## 2015-11-06 DIAGNOSIS — Z8619 Personal history of other infectious and parasitic diseases: Secondary | ICD-10-CM | POA: Insufficient documentation

## 2015-11-06 LAB — CBC
HCT: 42.3 % (ref 39.0–52.0)
Hemoglobin: 14.9 g/dL (ref 13.0–17.0)
MCH: 28.2 pg (ref 26.0–34.0)
MCHC: 35.2 g/dL (ref 30.0–36.0)
MCV: 80 fL (ref 78.0–100.0)
PLATELETS: 432 10*3/uL — AB (ref 150–400)
RBC: 5.29 MIL/uL (ref 4.22–5.81)
RDW: 13.2 % (ref 11.5–15.5)
WBC: 16.4 10*3/uL — AB (ref 4.0–10.5)

## 2015-11-06 LAB — COMPREHENSIVE METABOLIC PANEL
ALBUMIN: 4.2 g/dL (ref 3.5–5.0)
ALK PHOS: 85 U/L (ref 38–126)
ALT: 15 U/L — AB (ref 17–63)
AST: 14 U/L — AB (ref 15–41)
Anion gap: 15 (ref 5–15)
BILIRUBIN TOTAL: 1.1 mg/dL (ref 0.3–1.2)
BUN: 9 mg/dL (ref 6–20)
CO2: 25 mmol/L (ref 22–32)
CREATININE: 1.15 mg/dL (ref 0.61–1.24)
Calcium: 10.2 mg/dL (ref 8.9–10.3)
Chloride: 98 mmol/L — ABNORMAL LOW (ref 101–111)
GFR calc Af Amer: >60 mL/min (ref >60)
GLUCOSE: 355 mg/dL — AB (ref 65–99)
POTASSIUM: 4.5 mmol/L (ref 3.5–5.1)
Sodium: 138 mmol/L (ref 135–145)
TOTAL PROTEIN: 7.2 g/dL (ref 6.5–8.1)

## 2015-11-06 LAB — URINALYSIS, ROUTINE W REFLEX MICROSCOPIC
Hgb urine dipstick: NEGATIVE
KETONES UR: 15 mg/dL — AB
NITRITE: NEGATIVE
PROTEIN: 100 mg/dL — AB
Specific Gravity, Urine: 1.015 (ref 1.005–1.030)
pH: 7 (ref 5.0–8.0)

## 2015-11-06 LAB — LIPASE, BLOOD: Lipase: 47 U/L (ref 11–51)

## 2015-11-06 LAB — URINE MICROSCOPIC-ADD ON

## 2015-11-06 MED ORDER — PROMETHAZINE HCL 25 MG PO TABS: 25.0000 mg | ORAL_TABLET | Freq: Three times a day (TID) | ORAL | Status: DC | PRN

## 2015-11-06 MED ORDER — PROMETHAZINE HCL 25 MG/ML IJ SOLN
25.0000 mg | Freq: Once | INTRAMUSCULAR | Status: AC
  Administered 2015-11-06: 25 mg via INTRAVENOUS
  Filled 2015-11-06: qty 1

## 2015-11-06 MED ORDER — FENTANYL CITRATE (PF) 100 MCG/2ML IJ SOLN
100.0000 ug | Freq: Once | INTRAMUSCULAR | Status: AC
  Administered 2015-11-06: 100 ug via INTRAVENOUS
  Filled 2015-11-06: qty 2

## 2015-11-06 MED ORDER — SODIUM CHLORIDE 0.9 % IV BOLUS (SEPSIS)
1000.0000 mL | Freq: Once | INTRAVENOUS | Status: AC
  Administered 2015-11-06: 1000 mL via INTRAVENOUS

## 2015-11-06 MED ORDER — HYDROMORPHONE HCL 1 MG/ML IJ SOLN
1.0000 mg | Freq: Once | INTRAMUSCULAR | Status: AC
Start: 2015-11-06 — End: 2015-11-06
  Administered 2015-11-06: 1 mg via INTRAVENOUS
  Filled 2015-11-06: qty 1

## 2015-11-06 MED ORDER — OXYCODONE-ACETAMINOPHEN 5-325 MG PO TABS: 1.0000 | ORAL_TABLET | Freq: Four times a day (QID) | ORAL | Status: DC | PRN

## 2015-11-06 MED ORDER — IOHEXOL 300 MG/ML  SOLN
100.0000 mL | Freq: Once | INTRAMUSCULAR | Status: AC | PRN
  Administered 2015-11-06: 100 mL via INTRAVENOUS

## 2015-11-06 NOTE — ED Provider Notes (Signed)
CSN: [id number]     Arrival date & time 11/06/15  1133 History   First MD Initiated Contact with Patient 11/06/15 1302     Chief Complaint  Patient presents with  . Abdominal Pain     (Consider location/radiation/quality/duration/timing/severity/associated sxs/prior Treatment) HPI Patient presents to the emergency department with abdominal pain that started over the last few days.  Patient states he was recently hospitalized with pancreatitis.  Patient states that over the last few days.  Pain has gotten worse with nausea and vomiting.  The patient states that seen by his primary doctor a couple of days ago.  Patient states nothing seems make her condition better or worse.  Patient states that not having chest pain, shortness breath, weakness, dizziness, headache, blurred vision, back pain, dysuria, incontinence, hematemesis, bloody stool, fever, cough, diarrhea, or syncope.  The patient states that he did not take any medications prior to arrival.  Patient states palpation and movement make the pain worse Past Medical History  Diagnosis Date  . Hypertension   . Chronic bronchitis ([place])     "get it about q year" (11/20/2013)  . DM type 1 (diabetes mellitus, type 1) ([place]) 11/2013    , went straight to Insulin.   Marland Kitchen Hypertriglyceridemia   . Microalbuminuria   . Molluscum contagiosum 06/2015    upper extremities.   . Hepatic steatosis 11/2013    seen on CT, LFTs normal. Rare if any ETOH  . Pancreatitis    Past Surgical History  Procedure Laterality Date  . Tonsillectomy and adenoidectomy  ~ 1999  . Esophagogastroduodenoscopy N/A 11/21/2013    Procedure: ESOPHAGOGASTRODUODENOSCOPY (EGD);  Surgeon: Wonda Horner, MD;  Location: [facility] ENDOSCOPY;  Service: Endoscopy;  Laterality: N/A;   Family History  Problem Relation Age of Onset  . Diabetes Mellitus II Father   . Heart failure Father   . Asthma Mother    Social History  Substance Use Topics  . Smoking status: Former Smoker -- 0.00  packs/day for 3 years    Types: Cigarettes    Quit date: 12/11/2013  . Smokeless tobacco: None  . Alcohol Use: No     Comment: 11/20/2013 "only drink at birthdays, parties, holidays"    Review of Systems  All other systems negative except as documented in the HPI. All pertinent positives and negatives as reviewed in the HPI.  Allergies  Review of patient's allergies indicates no known allergies.  Home Medications   Prior to Admission medications   Medication Sig Start Date End Date Taking? Authorizing Provider  Blood Glucose Monitoring Suppl (ACCU-CHEK AVIVA PLUS) W/DEVICE KIT Check blood sugars 4 times daily (before meals and at bedtime) 12/20/14  Yes Tresa Garter, MD  glucose blood (ACCU-CHEK AVIVA) test strip Use as instructed 12/20/14  Yes Tresa Garter, MD  hydrOXYzine (ATARAX/VISTARIL) 25 MG tablet Take 1 tablet (25 mg total) by mouth every 8 (eight) hours as needed for itching. 05/15/15  Yes Hope Bunnie Pion, NP  insulin aspart (NOVOLOG) 100 UNIT/ML injection Inject 3-10 Units into the skin 3 (three) times daily. Blood Glucose 150 - 200, give 3 units Blood Glucose 201 - 250, give 5 units Blood Glucose 251 - 300, give 7 units Blood Glucose 301 - 350, give 10 units 10/31/15  Yes [name] E Doreene Burke, MD  Insulin Glargine (LANTUS) 100 UNIT/ML Solostar Pen Inject 45 Units into the skin daily at 10 pm. 10/31/15  Yes Tresa Garter, MD  Lancet Devices [facility]) lancets Use as instructed  12/20/14  Yes Tresa Garter, MD  lisinopril (PRINIVIL,ZESTRIL) 10 MG tablet Take 1 tablet (10 mg total) by mouth daily. 06/25/15  Yes Arnoldo Morale, MD  metFORMIN (GLUCOPHAGE) 500 MG tablet Take 2 tablets (1,000 mg total) by mouth 2 (two) times daily with a meal. 06/25/15  Yes Arnoldo Morale, MD  omeprazole (PRILOSEC) 40 MG capsule Take 1 capsule (40 mg total) by mouth daily. 10/31/15  Yes [name] E Doreene Burke, MD  podofilox (CONDYLOX) 0.5 % external solution Apply topically 2 (two)  times daily. For 3 consecutive days then off 4 days and repeat for 4 consecutive weeks. 06/25/15  Yes Arnoldo Morale, MD  traMADol (ULTRAM) 50 MG tablet Take 2 tablets (100 mg total) by mouth every 6 (six) hours as needed. 10/26/15  Yes Simbiso Ranga, MD  triamcinolone cream (KENALOG) 0.1 % Apply 1 application topically 2 (two) times daily. 05/22/15  Yes Arnoldo Morale, MD  fenofibrate (TRICOR) 145 MG tablet Take 1 tablet (145 mg total) by mouth daily. 05/22/15   Arnoldo Morale, MD  levofloxacin (LEVAQUIN) 750 MG tablet Take 1 tablet (750 mg total) by mouth daily. Patient not taking: Reported on 10/31/2015 10/26/15   Simbiso Ranga, MD   BP 129/51 mmHg  Pulse 98  Temp(Src)   Resp 21  SpO2 100% Physical Exam  Constitutional: He is oriented to person, place, and time. He appears well-developed and well-nourished. No distress.  HENT:  Head: Normocephalic and atraumatic.  Mouth/Throat: Oropharynx is clear and moist.  Eyes: Pupils are equal, round, and reactive to light.  Neck: Normal range of motion. Neck supple.  Cardiovascular: Normal rate, regular rhythm and normal heart sounds.  Exam reveals no gallop and no friction rub.   No murmur heard. Pulmonary/Chest: Effort normal and breath sounds normal. No respiratory distress. He has no wheezes.  Abdominal: Soft. Bowel sounds are normal. He exhibits no distension. There is tenderness. There is no rebound and no guarding.  Musculoskeletal: He exhibits no edema.  Neurological: He is alert and oriented to person, place, and time. He exhibits normal muscle tone. Coordination normal.  Skin: Skin is warm and dry. No rash noted. No erythema.  Psychiatric: He has a normal mood and affect. His behavior is normal.  Nursing note and vitals reviewed.   ED Course  Procedures (including critical care time) Labs Review Labs Reviewed  COMPREHENSIVE METABOLIC PANEL - Abnormal; Notable for the following:    Chloride 98 (*)    Glucose, Bld 355 (*)    AST 14 (*)     ALT 15 (*)    All other components within normal limits  CBC - Abnormal; Notable for the following:    WBC 16.4 (*)    Platelets 432 (*)    All other components within normal limits  URINALYSIS, ROUTINE W REFLEX MICROSCOPIC (NOT AT [facility]) - Abnormal; Notable for the following:    Color, Urine AMBER (*)    APPearance CLOUDY (*)    Glucose, UA >1000 (*)    Bilirubin Urine SMALL (*)    Ketones, ur 15 (*)    Protein, ur 100 (*)    Leukocytes, UA TRACE (*)    All other components within normal limits  URINE MICROSCOPIC-ADD ON - Abnormal; Notable for the following:    Squamous Epithelial / LPF 0-5 (*)    Bacteria, UA RARE (*)    Casts HYALINE CASTS (*)    All other components within normal limits  LIPASE, BLOOD    Imaging Review No results  found. I have personally reviewed and evaluated these images and lab results as part of my medical decision-making.  Patient is waiting CT scan results.  His laboratory testing does not show any significant abnormality other than  Dehydration.  The patient is  given IV fluids here in the emergency department, also given pain control.    Dalia Heading, PA-C 11/06/15 [place], MD 11/08/15 2231

## 2015-11-06 NOTE — ED Notes (Signed)
Pt placed on 2L O2 nasal cannula with medication administration.

## 2015-11-06 NOTE — ED Provider Notes (Signed)
Care assumed from previous provider PA Lawyer, case discussed, plan agreed upon. Will re-evaluate patient after pain control, PO challenge, and ensure patient safe to dc to home. CT with evolving pseudocyst, no necrosis. Labs reviewed.  Gen: afebrile, NAD HEENT: EOMI, MMM Resp: no resp distress CV: RRR, no MRG Abd: soft, ND - diffuse TTP MsK: moving all extremities well Neuro: A&O x4  Pt. States mild improvement with pain medication. Patient was able to tolerate crackers and ginger ale with no episodes of emesis while in my care in ED. Will dc to home with pain and nausea control. Patient informed he should call the GI clinic listed in the morning to schedule a follow up appointment.   [facility], PA-C 11/06/15 2034  Dione Booze, MD 11/07/15 [place]

## 2015-11-06 NOTE — ED Notes (Signed)
Pt tolerating PO crackers and ginger ale, pt resting comfortably, when pt woke he stated that his pain is worse and for a 4 to a 7.

## 2015-11-06 NOTE — ED Notes (Signed)
Pt here for RUQ pain sts "it my pancreatitis". sts he was recently hospitalized for the same. sts N,V.

## 2015-11-06 NOTE — Discharge Instructions (Signed)
Return here as needed.  Follow-up with the GI Dr. provided.  Follow a clear liquid diet over the next 48 hours

## 2015-11-09 ENCOUNTER — Encounter (HOSPITAL_COMMUNITY): Payer: Self-pay

## 2015-11-09 ENCOUNTER — Inpatient Hospital Stay (HOSPITAL_COMMUNITY)
Admission: EM | Admit: 2015-11-09 | Discharge: 2015-11-12 | DRG: 073 | Disposition: A | Discharge status: Home / Self Care | Payer: Self-pay | Attending: Family Medicine | Admitting: Family Medicine

## 2015-11-09 DIAGNOSIS — K219 Gastro-esophageal reflux disease without esophagitis: Secondary | ICD-10-CM | POA: Diagnosis present

## 2015-11-09 DIAGNOSIS — R109 Unspecified abdominal pain: Secondary | ICD-10-CM | POA: Diagnosis present

## 2015-11-09 DIAGNOSIS — K859 Acute pancreatitis without necrosis or infection, unspecified: Secondary | ICD-10-CM | POA: Diagnosis present

## 2015-11-09 DIAGNOSIS — K863 Pseudocyst of pancreas: Secondary | ICD-10-CM | POA: Diagnosis present

## 2015-11-09 DIAGNOSIS — Z825 Family history of asthma and other chronic lower respiratory diseases: Secondary | ICD-10-CM

## 2015-11-09 DIAGNOSIS — Z8249 Family history of ischemic heart disease and other diseases of the circulatory system: Secondary | ICD-10-CM

## 2015-11-09 DIAGNOSIS — E1010 Type 1 diabetes mellitus with ketoacidosis without coma: Secondary | ICD-10-CM | POA: Diagnosis present

## 2015-11-09 DIAGNOSIS — Z720 Tobacco use: Secondary | ICD-10-CM | POA: Diagnosis present

## 2015-11-09 DIAGNOSIS — K858 Other acute pancreatitis without necrosis or infection: Secondary | ICD-10-CM

## 2015-11-09 DIAGNOSIS — I10 Essential (primary) hypertension: Secondary | ICD-10-CM | POA: Diagnosis present

## 2015-11-09 DIAGNOSIS — E1140 Type 2 diabetes mellitus with diabetic neuropathy, unspecified: Secondary | ICD-10-CM

## 2015-11-09 DIAGNOSIS — R1110 Vomiting, unspecified: Secondary | ICD-10-CM

## 2015-11-09 DIAGNOSIS — R509 Fever, unspecified: Secondary | ICD-10-CM

## 2015-11-09 DIAGNOSIS — N390 Urinary tract infection, site not specified: Secondary | ICD-10-CM | POA: Diagnosis present

## 2015-11-09 DIAGNOSIS — F17200 Nicotine dependence, unspecified, uncomplicated: Secondary | ICD-10-CM | POA: Diagnosis present

## 2015-11-09 DIAGNOSIS — N179 Acute kidney failure, unspecified: Secondary | ICD-10-CM | POA: Diagnosis present

## 2015-11-09 DIAGNOSIS — E1021 Type 1 diabetes mellitus with diabetic nephropathy: Secondary | ICD-10-CM | POA: Diagnosis present

## 2015-11-09 DIAGNOSIS — Z833 Family history of diabetes mellitus: Secondary | ICD-10-CM

## 2015-11-09 DIAGNOSIS — K3184 Gastroparesis: Secondary | ICD-10-CM | POA: Diagnosis present

## 2015-11-09 DIAGNOSIS — B081 Molluscum contagiosum: Secondary | ICD-10-CM | POA: Diagnosis present

## 2015-11-09 DIAGNOSIS — R112 Nausea with vomiting, unspecified: Secondary | ICD-10-CM | POA: Diagnosis present

## 2015-11-09 DIAGNOSIS — E781 Pure hyperglyceridemia: Secondary | ICD-10-CM | POA: Diagnosis present

## 2015-11-09 DIAGNOSIS — E1043 Type 1 diabetes mellitus with diabetic autonomic (poly)neuropathy: Principal | ICD-10-CM | POA: Diagnosis present

## 2015-11-09 DIAGNOSIS — E860 Dehydration: Secondary | ICD-10-CM | POA: Diagnosis present

## 2015-11-09 DIAGNOSIS — Z9119 Patient's noncompliance with other medical treatment and regimen: Secondary | ICD-10-CM

## 2015-11-09 DIAGNOSIS — Z794 Long term (current) use of insulin: Secondary | ICD-10-CM

## 2015-11-09 LAB — COMPREHENSIVE METABOLIC PANEL
ALBUMIN: 3.5 g/dL (ref 3.5–5.0)
ALT: 11 U/L — ABNORMAL LOW (ref 17–63)
AST: 11 U/L — AB (ref 15–41)
Alkaline Phosphatase: 90 U/L (ref 38–126)
Anion gap: 21 — ABNORMAL HIGH (ref 5–15)
BUN: 16 mg/dL (ref 6–20)
CHLORIDE: 93 mmol/L — AB (ref 101–111)
CO2: 21 mmol/L — AB (ref 22–32)
CREATININE: 1.33 mg/dL — AB (ref 0.61–1.24)
Calcium: 9.9 mg/dL (ref 8.9–10.3)
GFR calc Af Amer: >60 mL/min (ref >60)
Glucose, Bld: 297 mg/dL — ABNORMAL HIGH (ref 65–99)
POTASSIUM: 4.2 mmol/L (ref 3.5–5.1)
Sodium: 135 mmol/L (ref 135–145)
TOTAL PROTEIN: 7.6 g/dL (ref 6.5–8.1)
Total Bilirubin: 1.7 mg/dL — ABNORMAL HIGH (ref 0.3–1.2)

## 2015-11-09 LAB — BASIC METABOLIC PANEL
ANION GAP: 14 (ref 5–15)
ANION GAP: 12 (ref 5–15)
BUN: 13 mg/dL (ref 6–20)
BUN: 11 mg/dL (ref 6–20)
CALCIUM: 8.8 mg/dL — AB (ref 8.9–10.3)
CALCIUM: 8.5 mg/dL — AB (ref 8.9–10.3)
CO2: 28 mmol/L (ref 22–32)
CO2: 26 mmol/L (ref 22–32)
Chloride: 98 mmol/L — ABNORMAL LOW (ref 101–111)
Chloride: 97 mmol/L — ABNORMAL LOW (ref 101–111)
Creatinine, Ser: 1.03 mg/dL (ref 0.61–1.24)
Creatinine, Ser: 1.04 mg/dL (ref 0.61–1.24)
GLUCOSE: 251 mg/dL — AB (ref 65–99)
Glucose, Bld: 119 mg/dL — ABNORMAL HIGH (ref 65–99)
POTASSIUM: 3.4 mmol/L — AB (ref 3.5–5.1)
Potassium: 3.9 mmol/L (ref 3.5–5.1)
SODIUM: 140 mmol/L (ref 135–145)
SODIUM: 135 mmol/L (ref 135–145)

## 2015-11-09 LAB — URINALYSIS, ROUTINE W REFLEX MICROSCOPIC
Glucose, UA: >1000 mg/dL — AB
Leukocytes, UA: NEGATIVE
NITRITE: NEGATIVE
PROTEIN: 100 mg/dL — AB
pH: 5 (ref 5.0–8.0)

## 2015-11-09 LAB — CBC WITH DIFFERENTIAL/PLATELET
BASOS ABS: 0 10*3/uL (ref 0.0–0.1)
BASOS PCT: 0 %
EOS ABS: 0.1 10*3/uL (ref 0.0–0.7)
Eosinophils Relative: 1 %
HEMATOCRIT: 39.9 % (ref 39.0–52.0)
Hemoglobin: 13.6 g/dL (ref 13.0–17.0)
Lymphocytes Relative: 20 %
Lymphs Abs: 1.4 10*3/uL (ref 0.7–4.0)
MCH: 27.8 pg (ref 26.0–34.0)
MCHC: 34.1 g/dL (ref 30.0–36.0)
MCV: 81.6 fL (ref 78.0–100.0)
MONO ABS: 1.1 10*3/uL — AB (ref 0.1–1.0)
Monocytes Relative: 16 %
NEUTROS ABS: 4.4 10*3/uL (ref 1.7–7.7)
NEUTROS PCT: 63 %
Platelets: 288 10*3/uL (ref 150–400)
RBC: 4.89 MIL/uL (ref 4.22–5.81)
RDW: 13.1 % (ref 11.5–15.5)
WBC: 7 10*3/uL (ref 4.0–10.5)

## 2015-11-09 LAB — URINE MICROSCOPIC-ADD ON: Bacteria, UA: NONE SEEN

## 2015-11-09 LAB — LIPID PANEL
CHOL/HDL RATIO: 8.9 ratio
Cholesterol: 142 mg/dL (ref 0–200)
HDL: 16 mg/dL — AB (ref >40)
LDL Cholesterol: 78 mg/dL (ref 0–99)
Triglycerides: 241 mg/dL — ABNORMAL HIGH (ref <150)
VLDL: 48 mg/dL — AB (ref 0–40)

## 2015-11-09 LAB — GLUCOSE, CAPILLARY
GLUCOSE-CAPILLARY: 245 mg/dL — AB (ref 65–99)
GLUCOSE-CAPILLARY: 124 mg/dL — AB (ref 65–99)
Glucose-Capillary: 203 mg/dL — ABNORMAL HIGH (ref 65–99)

## 2015-11-09 LAB — LIPASE, BLOOD: LIPASE: 30 U/L (ref 11–51)

## 2015-11-09 MED ORDER — INSULIN ASPART 100 UNIT/ML ~~LOC~~ SOLN
0.0000 [IU] | Freq: Three times a day (TID) | SUBCUTANEOUS | Status: DC
  Administered 2015-11-10 – 2015-11-11 (×4): 3 [IU] via SUBCUTANEOUS
  Administered 2015-11-11 – 2015-11-12 (×3): 2 [IU] via SUBCUTANEOUS
  Administered 2015-11-12: 3 [IU] via SUBCUTANEOUS

## 2015-11-09 MED ORDER — INSULIN ASPART 100 UNIT/ML ~~LOC~~ SOLN
5.0000 [IU] | Freq: Once | SUBCUTANEOUS | Status: AC
  Administered 2015-11-09: 5 [IU] via SUBCUTANEOUS
  Filled 2015-11-09: qty 1

## 2015-11-09 MED ORDER — ACETAMINOPHEN 325 MG PO TABS
650.0000 mg | ORAL_TABLET | Freq: Four times a day (QID) | ORAL | Status: DC | PRN
  Administered 2015-11-10 (×2): 650 mg via ORAL
  Filled 2015-11-09 (×2): qty 2

## 2015-11-09 MED ORDER — INSULIN GLARGINE 100 UNIT/ML ~~LOC~~ SOLN
30.0000 [IU] | Freq: Every day | SUBCUTANEOUS | Status: DC
  Administered 2015-11-09 – 2015-11-11 (×3): 30 [IU] via SUBCUTANEOUS
  Filled 2015-11-09 (×5): qty 0.3

## 2015-11-09 MED ORDER — ENOXAPARIN SODIUM 30 MG/0.3ML ~~LOC~~ SOLN
30.0000 mg | SUBCUTANEOUS | Status: DC
  Administered 2015-11-09 – 2015-11-11 (×3): 30 mg via SUBCUTANEOUS
  Filled 2015-11-09 (×3): qty 0.3

## 2015-11-09 MED ORDER — HYDRALAZINE HCL 20 MG/ML IJ SOLN: 5.0000 mg | INTRAMUSCULAR | Status: DC | PRN

## 2015-11-09 MED ORDER — INSULIN ASPART 100 UNIT/ML ~~LOC~~ SOLN
0.0000 [IU] | Freq: Three times a day (TID) | SUBCUTANEOUS | Status: DC
  Administered 2015-11-09: 7 [IU] via SUBCUTANEOUS
  Administered 2015-11-09: 3 [IU] via SUBCUTANEOUS

## 2015-11-09 MED ORDER — ACETAMINOPHEN 650 MG RE SUPP: 650.0000 mg | Freq: Four times a day (QID) | RECTAL | Status: DC | PRN

## 2015-11-09 MED ORDER — METOCLOPRAMIDE HCL 5 MG/ML IJ SOLN
10.0000 mg | Freq: Four times a day (QID) | INTRAMUSCULAR | Status: DC
  Administered 2015-11-09 – 2015-11-12 (×13): 10 mg via INTRAVENOUS
  Filled 2015-11-09 (×12): qty 2

## 2015-11-09 MED ORDER — PANTOPRAZOLE SODIUM 40 MG IV SOLR
40.0000 mg | INTRAVENOUS | Status: DC
  Administered 2015-11-09 – 2015-11-10 (×2): 40 mg via INTRAVENOUS
  Filled 2015-11-09 (×2): qty 40

## 2015-11-09 MED ORDER — SODIUM CHLORIDE 0.9 % IV BOLUS (SEPSIS)
2000.0000 mL | Freq: Once | INTRAVENOUS | Status: AC
  Administered 2015-11-09: 2000 mL via INTRAVENOUS

## 2015-11-09 MED ORDER — INSULIN ASPART 100 UNIT/ML ~~LOC~~ SOLN
0.0000 [IU] | Freq: Every day | SUBCUTANEOUS | Status: DC
  Administered 2015-11-09: 2 [IU] via SUBCUTANEOUS

## 2015-11-09 MED ORDER — POTASSIUM CHLORIDE CRYS ER 20 MEQ PO TBCR
40.0000 meq | EXTENDED_RELEASE_TABLET | Freq: Once | ORAL | Status: AC
  Administered 2015-11-09: 40 meq via ORAL
  Filled 2015-11-09: qty 2

## 2015-11-09 MED ORDER — HYDROMORPHONE HCL 1 MG/ML IJ SOLN
1.0000 mg | Freq: Once | INTRAMUSCULAR | Status: AC
  Administered 2015-11-09: 1 mg via INTRAVENOUS
  Filled 2015-11-09: qty 1

## 2015-11-09 MED ORDER — HYDROMORPHONE HCL 1 MG/ML IJ SOLN
1.0000 mg | INTRAMUSCULAR | Status: DC | PRN
  Administered 2015-11-09 – 2015-11-12 (×19): 1 mg via INTRAVENOUS
  Filled 2015-11-09 (×19): qty 1

## 2015-11-09 MED ORDER — POTASSIUM CHLORIDE 10 MEQ/100ML IV SOLN
10.0000 meq | INTRAVENOUS | Status: DC
  Administered 2015-11-09: 10 meq via INTRAVENOUS
  Filled 2015-11-09: qty 100

## 2015-11-09 MED ORDER — SODIUM CHLORIDE 0.9 % IV SOLN
INTRAVENOUS | Status: AC
  Administered 2015-11-09: 12:00:00 via INTRAVENOUS

## 2015-11-09 MED ORDER — ONDANSETRON HCL 4 MG/2ML IJ SOLN
4.0000 mg | Freq: Once | INTRAMUSCULAR | Status: AC
  Administered 2015-11-09: 4 mg via INTRAVENOUS
  Filled 2015-11-09: qty 2

## 2015-11-09 MED ORDER — ONDANSETRON HCL 4 MG/2ML IJ SOLN
4.0000 mg | INTRAMUSCULAR | Status: DC
  Administered 2015-11-09 – 2015-11-12 (×18): 4 mg via INTRAVENOUS
  Filled 2015-11-09 (×18): qty 2

## 2015-11-09 NOTE — Progress Notes (Signed)
Pt complained of dizziness and burning of his arm during potassium chloride infusion. Infusion stopped. VS taken. VS stable. MD made aware. Given orders to discontinue potassium infusions and given of Potassium PO once. Will continue to assess and monitor pt.   Lujean Amel

## 2015-11-09 NOTE — ED Notes (Signed)
Pt c/o n/v and abd pain x 2 days. Unable to tolerate PO food/fluids. Reports hx pancreatitis. Pt seen recently for same symptoms and given nausea meds w/ no relief.

## 2015-11-09 NOTE — ED Provider Notes (Signed)
CSN: [id number]     Arrival date & time 11/09/15  4098 History   First MD Initiated Contact with Patient 11/09/15 [phone]     Chief Complaint  Patient presents with  . Abdominal Pain  . Emesis     (Consider location/radiation/quality/duration/timing/severity/associated sxs/prior Treatment) HPI Complains of abdominal pain epigastric in location radiating to back onset 2 weeks ago. Patient states she's been vomiting and unable to hold anything down without vomiting for the past 3 days. He denies any fever. He's been treating himself with promethazine and with Percocet, without relief. Last bowel movement 2 days ago, no other associated symptoms. Last drank alcohol 2 weeks ago. States he drinks 1 drink per day. No other associated symptoms. Past Medical History  Diagnosis Date  . Hypertension   . Chronic bronchitis ([place])     "get it about q year" (11/20/2013)  . DM type 1 (diabetes mellitus, type 1) ([place]) 11/2013    , went straight to Insulin.   Marland Kitchen Hypertriglyceridemia   . Microalbuminuria   . Molluscum contagiosum 06/2015    upper extremities.   . Hepatic steatosis 11/2013    seen on CT, LFTs normal. Rare if any ETOH  . Pancreatitis    Past Surgical History  Procedure Laterality Date  . Tonsillectomy and adenoidectomy  ~ 1999  . Esophagogastroduodenoscopy N/A 11/21/2013    Procedure: ESOPHAGOGASTRODUODENOSCOPY (EGD);  Surgeon: Wonda Horner, MD;  Location: [facility] ENDOSCOPY;  Service: Endoscopy;  Laterality: N/A;   Family History  Problem Relation Age of Onset  . Diabetes Mellitus II Father   . Heart failure Father   . Asthma Mother    Social History  Substance Use Topics  . Smoking status: Former Smoker -- 0.00 packs/day for 3 years    Types: Cigarettes    Quit date: 12/11/2013  . Smokeless tobacco: None  . Alcohol Use: No     Comment: 11/20/2013 "only drink at birthdays, parties, holidays"    Review of Systems  Constitutional: Negative.   HENT: Negative.   Respiratory: Negative.    Cardiovascular: Negative.   Gastrointestinal: Positive for nausea, vomiting and abdominal pain. Negative for blood in stool.  Musculoskeletal: Negative.   Skin: Negative.   Allergic/Immunologic: Positive for immunocompromised state.       Diabetic  Neurological: Negative.   Psychiatric/Behavioral: Negative.   All other systems reviewed and are negative.     Allergies  Review of patient's allergies indicates no known allergies.  Home Medications   Prior to Admission medications   Medication Sig Start Date End Date Taking? Authorizing Provider  Blood Glucose Monitoring Suppl (ACCU-CHEK AVIVA PLUS) W/DEVICE KIT Check blood sugars 4 times daily (before meals and at bedtime) 12/20/14  Yes Tresa Garter, MD  fenofibrate (TRICOR) 145 MG tablet Take 1 tablet (145 mg total) by mouth daily. 05/22/15  Yes Arnoldo Morale, MD  glucose blood (ACCU-CHEK AVIVA) test strip Use as instructed 12/20/14  Yes [name] E Jegede, MD  insulin aspart (NOVOLOG) 100 UNIT/ML injection Inject 3-10 Units into the skin 3 (three) times daily. Blood Glucose 150 - 200, give 3 units Blood Glucose 201 - 250, give 5 units Blood Glucose 251 - 300, give 7 units Blood Glucose 301 - 350, give 10 units 10/31/15  Yes Tresa Garter, MD  Insulin Glargine (LANTUS) 100 UNIT/ML Solostar Pen Inject 45 Units into the skin daily at 10 pm. 10/31/15  Yes Tresa Garter, MD  Lancet Devices [facility]) lancets Use as instructed 12/20/14  Yes Tresa Garter, MD  lisinopril (PRINIVIL,ZESTRIL) 10 MG tablet Take 1 tablet (10 mg total) by mouth daily. 06/25/15  Yes Arnoldo Morale, MD  metFORMIN (GLUCOPHAGE) 500 MG tablet Take 2 tablets (1,000 mg total) by mouth 2 (two) times daily with a meal. 06/25/15  Yes Arnoldo Morale, MD  omeprazole (PRILOSEC) 40 MG capsule Take 1 capsule (40 mg total) by mouth daily. 10/31/15  Yes Tresa Garter, MD  oxyCODONE-acetaminophen (PERCOCET/ROXICET) 5-325 MG tablet Take 1 tablet by mouth  every 6 (six) hours as needed for severe pain. 11/06/15  Yes Christopher Lawyer, PA-C  promethazine (PHENERGAN) 25 MG tablet Take 1 tablet (25 mg total) by mouth every 8 (eight) hours as needed for nausea or vomiting. 11/06/15  Yes Christopher Lawyer, PA-C   BP 162/97 mmHg  Temp(Src) 98.5 F (36.9 C) (Oral)  Resp 20  SpO2 95% Physical Exam  Constitutional: He appears well-developed and well-nourished. He appears distressed.  Appears uncomfortable  HENT:  Head: Normocephalic and atraumatic.  Mucous membranes dry  Eyes: Conjunctivae are normal. Pupils are equal, round, and reactive to light.  Neck: Neck supple. No tracheal deviation present. No thyromegaly present.  Cardiovascular: Normal rate and regular rhythm.   No murmur heard. Pulmonary/Chest: Effort normal and breath sounds normal.  Abdominal: Soft. Bowel sounds are normal. He exhibits no distension. There is tenderness.  Tender at epigastrium  Musculoskeletal: Normal range of motion. He exhibits no edema or tenderness.  Neurological: He is alert. Coordination normal.  Skin: Skin is warm and dry. No rash noted.  Psychiatric: He has a normal mood and affect.  Nursing note and vitals reviewed.   ED Course  Procedures (including critical care time) Labs Review Labs Reviewed  COMPREHENSIVE METABOLIC PANEL  CBC WITH DIFFERENTIAL/PLATELET  LIPASE, BLOOD    Imaging Review No results found. I have personally reviewed and evaluated these images and lab results as part of my medical decision-making.   EKG Interpretation None     9:35 AM pain and nausea improved after treatment with intravenous fluids, intravenous opioids and antiemetic. Results for orders placed or performed during the hospital encounter of 11/09/15  Comprehensive metabolic panel  Result Value Ref Range   Sodium 135 135 - 145 mmol/L   Potassium 4.2 3.5 - 5.1 mmol/L   Chloride 93 (L) 101 - 111 mmol/L   CO2 21 (L) 22 - 32 mmol/L   Glucose, Bld 297 (H) 65 - 99  mg/dL   BUN 16 6 - 20 mg/dL   Creatinine, Ser 1.33 (H) 0.61 - 1.24 mg/dL   Calcium 9.9 8.9 - 10.3 mg/dL   Total Protein 7.6 6.5 - 8.1 g/dL   Albumin 3.5 3.5 - 5.0 g/dL   AST 11 (L) 15 - 41 U/L   ALT 11 (L) 17 - 63 U/L   Alkaline Phosphatase 90 38 - 126 U/L   Total Bilirubin 1.7 (H) 0.3 - 1.2 mg/dL   GFR calc non Af Amer >60 >60 mL/min   GFR calc Af Amer >60 >60 mL/min   Anion gap 21 (H) 5 - 15  CBC with Differential/Platelet  Result Value Ref Range   WBC 7.0 4.0 - 10.5 K/uL   RBC 4.89 4.22 - 5.81 MIL/uL   Hemoglobin 13.6 13.0 - 17.0 g/dL   HCT 39.9 39.0 - 52.0 %   MCV 81.6 78.0 - 100.0 fL   MCH 27.8 26.0 - 34.0 pg   MCHC 34.1 30.0 - 36.0 g/dL   RDW 13.1 11.5 -  15.5 %   Platelets 288 150 - 400 K/uL   Neutrophils Relative % 63 %   Neutro Abs 4.4 1.7 - 7.7 K/uL   Lymphocytes Relative 20 %   Lymphs Abs 1.4 0.7 - 4.0 K/uL   Monocytes Relative 16 %   Monocytes Absolute 1.1 (H) 0.1 - 1.0 K/uL   Eosinophils Relative 1 %   Eosinophils Absolute 0.1 0.0 - 0.7 K/uL   Basophils Relative 0 %   Basophils Absolute 0.0 0.0 - 0.1 K/uL  Lipase, blood  Result Value Ref Range   Lipase 30 11 - 51 U/L   US Abdomen Complete  10/17/2015  CLINICAL DATA:  Initial evaluation for acute pancreatitis. EXAM: ABDOMEN ULTRASOUND COMPLETE COMPARISON:  Prior CT from 10/16/2015. FINDINGS: Gallbladder: No gallstones or wall thickening visualized. No sonographic Murphy sign noted by sonographer. Common bile duct: Diameter: 2.8 mm Liver: No focal lesion identified. Within normal limits in parenchymal echogenicity. IVC: No abnormality visualized. Pancreas: Not well visualized, with known inflammatory changes not well seen. Probable duodenal wall thickening noted, similar to previous CT. Spleen: Size and appearance within normal limits. Right Kidney: Length: 12.8 cm. Echogenicity within normal limits. No mass or hydronephrosis visualized. Left Kidney: Length: 14.0 cm. Echogenicity within normal limits. No mass or  hydronephrosis visualized. Abdominal aorta: No aneurysm visualized. Other findings: None. IMPRESSION: 1. Poor visualization of known inflammatory changes within the pancreas, although duodenal wall thickening appears grossly similar relative to recent CT. 2. Otherwise normal abdominal ultrasound. No cholelithiasis, choledocholithiasis, or biliary dilatation. Electronically Signed   By: Jeannine Boga M.D.   On: 10/17/2015 01:00   Ct Abdomen Pelvis W Contrast  11/06/2015  CLINICAL DATA:  Left upper quadrant pain with epigastric pain and nausea for about a week now. Previous history of pancreatitis. EXAM: CT ABDOMEN AND PELVIS WITH CONTRAST TECHNIQUE: Multidetector CT imaging of the abdomen and pelvis was performed using the standard protocol following bolus administration of intravenous contrast. CONTRAST:  145m OMNIPAQUE IOHEXOL 300 MG/ML  SOLN COMPARISON:  10/21/2015 FINDINGS: Lower chest: Trace atelectasis right lung base. Gynecomastia noted bilaterally. Hepatobiliary: No focal abnormality within the liver parenchyma. There is no evidence for gallstones, gallbladder wall thickening, or pericholecystic fluid. No intrahepatic or extrahepatic biliary dilation. Pancreas: As on prior studies, there is peripancreatic edema and inflammation. Features have progressed in the interval and the phlegmonous change seen on the previous study has become more organized with an evolving 5.8 x 7.8 x 11.1 cm complex fluid collection anterior to the pancreatic head showing areas of rim enhancement. 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 There is some hypoattenuation in the uncinate process of the pancreas likely related to edema/ inflammation. No dilatation of the main pancreatic duct. Pancreatic parenchyma continues to enhance diffusely. Edema/ inflammation tracks in the anterior para renal space of the retroperitoneal abdomen. Spleen: No focal mass lesion. No dilatation of the main duct. No  intraparenchymal cyst. No splenomegaly. No focal mass lesion. Adrenals/Urinary Tract: No adrenal nodule or mass. Kidneys are unremarkable. No evidence for hydroureter. The urinary bladder appears normal for the degree of distention. Stomach/Bowel: Mild thickening in the wall of the gastric antrum is assisted with duodenal wall thickening, changes likely secondary to the retroperitoneal edema/ inflammation. No evidence for gastric outlet obstruction at this time. No small bowel wall thickening. No small bowel dilatation. The terminal ileum is normal. High density material in the appendiceal lumen is new since 10/16/2015 and compatible with oral contrast material from previous CT scan. There  is some thickening in the wall of the hepatic flexure, adjacent to the peripancreatic inflammatory process. Left colon is normal in appearance. Vascular/Lymphatic: No abdominal aortic aneurysm. No abdominal atherosclerotic calcification. Portal vein and superior mesenteric vein are patent. Splenic vein remains patent. No evidence for celiac axis or SMA pseudoaneurysm. Persistent left-sided IVC noted. Borderline enlarged lymph nodes are seen in the root of the small bowel mesentery and in the peripancreatic tissues, compatible with the inflammatory process described above. No pelvic sidewall lymphadenopathy. Reproductive: The prostate gland and seminal vesicles have normal imaging features. Other: Trace intraperitoneal free fluid noted. Musculoskeletal: Bone windows reveal no worrisome lytic or sclerotic osseous lesions. IMPRESSION: Interval evolution of the peripancreatic edema/ inflammation with evolving pseudocyst now evident anterior to the head of the pancreas. No evidence for pancreatic necrosis at this time although there is some hypo attenuation in the uncinate process of the pancreas, likely related to edema/inflammation. No evidence for vascular complication in the upper abdomen. Electronically Signed   By: Misty Stanley  M.D.   On: 11/06/2015 15:37   Ct Abdomen Pelvis W Contrast  10/21/2015  CLINICAL DATA:  Followup exam for pancreatitis. Patient with continued abdominal pain and nausea. EXAM: CT ABDOMEN AND PELVIS WITH CONTRAST TECHNIQUE: Multidetector CT imaging of the abdomen and pelvis was performed using the standard protocol following bolus administration of intravenous contrast. CONTRAST:  155m OMNIPAQUE IOHEXOL 300 MG/ML  SOLN COMPARISON:  Abdomen and pelvis CTs, 10/16/2015 and 11/18/2013. FINDINGS: Lung bases: Increased patchy and linear opacity at the right lung base when compared the prior study consistent with increased atelectasis. Heart normal in size. Pancreas: Extensive peripancreatic inflammatory change and fluid extends along the small bowel mesentery and involves the omentum, courses along the gastrohepatic ligament and along the anterior para renal fascia on the right. Inflammatory changes have worsened when compared to the prior CT. Small amount of ascites collects in the pelvis. Pancreas shows diffuse enhancement. The superior mesenteric vein, portal vein and splenic vein remain patent. Liver:  Mildly enlarged.  No liver mass or focal lesion. Spleen:  Unremarkable. Gallbladder and biliary tree: There is some increased attenuation in the gallbladder without a discrete shadowing stone. No bile duct dilation. Adrenal glands:  Unremarkable. Kidneys, ureters, bladder:  Unremarkable. Lymph nodes:  No adenopathy. Gastrointestinal: Inflammatory changes surround the distal stomach and duodenum. Stomach and duodenum otherwise unremarkable. Normal small bowel. Inflammatory changes also contacts the right colon and transverse colon. Colon otherwise unremarkable. Normal appendix visualized. Musculoskeletal:  Unremarkable. IMPRESSION: 1. Worsened pancreatic inflammation when compared to the prior study. There is greater peripancreatic fluid and inflammatory change. A small amount of ascites has developed since the prior  study. 2. No evidence of pancreatic necrosis. No evidence of focal collection to suggest an abscess or pseudocyst. No evidence of venous thrombosis. 3. Increased right lung base atelectasis. Persistent, mild hepatomegaly. Electronically Signed   By: DLajean ManesM.D.   On: 10/21/2015 13:37   Ct Abdomen Pelvis W Contrast  10/16/2015  CLINICAL DATA:  Awoke today with severe mid abdominal pain, nausea and vomiting. EXAM: CT ABDOMEN AND PELVIS WITH CONTRAST TECHNIQUE: Multidetector CT imaging of the abdomen and pelvis was performed using the standard protocol following bolus administration of intravenous contrast. CONTRAST:  259mOMNIPAQUE IOHEXOL 300 MG/ML SOLN, 10034mMNIPAQUE IOHEXOL 300 MG/ML SOLN COMPARISON:  CT 11/18/2013 FINDINGS: Lower chest:  Mild dependent atelectasis in the right lower lobe. Liver: Prominent size. Diffusely decreased hepatic density consistent with steatosis. No focal lesion. Hepatobiliary: Gallbladder  physiologically distended, no calcified stone. No biliary dilatation. Pancreas: Marked soft tissue stranding about the head of the pancreas and in the retroperitoneum. No ductal dilatation. Spleen: Normal in size. Adrenal glands: No nodule. Kidneys: Symmetric renal enhancement. No hydronephrosis. No perinephric stranding. Stomach/Bowel: The stomach is decompressed. There is marked inflammatory change about the duodenum with associated wall thickening. Diffuse soft tissue stranding is seen. Small amount of fluid is scan tracking anterior to the left perirenal fascia. No extraluminal air. Multiple adjacent lymph nodes in the mesentery. Remaining small bowel loops are normal. Small volume of colonic stool. The appendix is normal. Vascular/Lymphatic: No retroperitoneal adenopathy. Abdominal aorta is normal in caliber. 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 A normal right IVC is demonstrated. Reproductive: Prostate gland normal in  size. Bladder: Decompressed.  No pelvic free flow Other: No free air or perforation. Musculoskeletal: There are no acute or suspicious osseous abnormalities. IMPRESSION: 1. Inflammatory changes in the retroperitoneum adjacent to the duodenum and pancreatic head. The overall distribution is similar to that of prior CT, however the degree of inflammation has increased. Differential considerations include duodenitis including peptic ulcer disease versus pancreatitis, possible groove pancreatitis. 2. Hepatic steatosis again seen. Electronically Signed   By: Jeb Levering M.D.   On: 10/16/2015 22:34   Dg Chest Port 1 View  10/18/2015  CLINICAL DATA:  25 year old male with fever and weakness EXAM: PORTABLE CHEST 1 VIEW COMPARISON:  Radiograph dated 12/22/2013 FINDINGS: Single portable view of the chest does not demonstrate a focal consolidation. There is no pleural effusion or pneumothorax. The cardiac silhouette is within normal limits. The osseous structures appear unremarkable. IMPRESSION: No active disease. Electronically Signed   By: Anner Crete M.D.   On: 10/18/2015 21:39    MDM  CT scan reviewed from 11/06/2015. Patient with chronic pancreatitis and pancreatic pseudocyst I consulted hospitalist service. Spoke with Dyanne Carrel, NP Final diagnoses:  None   plan: Admitmedical surgical floor, nothing by mouth, IV fluids Analgesics. Pt may have mildly elevated of DKA given slightly low bicarb, elevated anion gap , and hyperglycemia. Will need glycemic control Dx acute on chronic pancreatitis #3hyperglycemia #3renal insufficiency #2 hyperglycemia  Orlie Dakin, MD 11/09/15 [phone]

## 2015-11-09 NOTE — H&P (Signed)
[facility] History and Physical  Brent Costa ZLD:[id number] DOB: 08/13/1991 DOA: 11/09/2015  Referring physician: Carlos AmericanAngelica Chessman, MD   Chief Complaint: abdominal pain/emesis  HPI: Brent Costa is a 25 y.o. male with a past medical history of type 1 diabetes, hypertension, hypertriglyceridemia, GERD, molluscum contagiosum presents to the emergency department with the chief complaint of persistent worsening abdominal pain as well as persistent nausea and intermittent vomiting. Initial evaluation reveals mild DKA concern for pancreatitis, hypertension and tachycardia.  Patient reports persistent worsening epigastric abdominal pain. He reports he was in the hospital last month with same. Verbalizes triglycerides were greater than 1000 at that time. He reports being discharged and felt better for a few days and then the pain came back. He states he drinks approximately 1 alcoholic beverage a day. He denies fever chills headache cough diarrhea dysuria. He also confesses to noncompliance with his diabetes management.  In the emergency department he is afebrile hemodynamically stable with tachycardia 113 is not hypoxic. He is provided with Dilaudid and Zofran and IV fluids.  Review of Systems:  10 point review of systems complete and all systems are negative except as indicated in history of present illness  Past Medical History  Diagnosis Date  . Hypertension   . Chronic bronchitis ([place])     "get it about q year" (11/20/2013)  . DM type 1 (diabetes mellitus, type 1) ([place]) 11/2013    , went straight to Insulin.   Marland Kitchen Hypertriglyceridemia   . Microalbuminuria   . Molluscum contagiosum 06/2015    upper extremities.   . Hepatic steatosis 11/2013    seen on CT, LFTs normal. Rare if any ETOH  . Pancreatitis    Past Surgical History  Procedure Laterality Date  . Tonsillectomy and adenoidectomy  ~ 1999  . Esophagogastroduodenoscopy N/A 11/21/2013    Procedure:  ESOPHAGOGASTRODUODENOSCOPY (EGD);  Surgeon: Wonda Horner, MD;  Location: [facility] ENDOSCOPY;  Service: Endoscopy;  Laterality: N/A;   Social History:  reports that he quit smoking about 22 months ago. His smoking use included Cigarettes. He smoked 0.00 packs per day for 3 years. He does not have any smokeless tobacco history on file. He reports that he does not drink alcohol or use illicit drugs. Lives alone he works full-time at a [employer] continues to smoke independent with ADLs No Known Allergies  Family History  Problem Relation Age of Onset  . Diabetes Mellitus II Father   . Heart failure Father   . Asthma Mother      Prior to Admission medications   Medication Sig Start Date End Date Taking? Authorizing Provider  Blood Glucose Monitoring Suppl (ACCU-CHEK AVIVA PLUS) W/DEVICE KIT Check blood sugars 4 times daily (before meals and at bedtime) 12/20/14  Yes Tresa Garter, MD  fenofibrate (TRICOR) 145 MG tablet Take 1 tablet (145 mg total) by mouth daily. 05/22/15  Yes Arnoldo Morale, MD  glucose blood (ACCU-CHEK AVIVA) test strip Use as instructed 12/20/14  Yes [name] E Jegede, MD  insulin aspart (NOVOLOG) 100 UNIT/ML injection Inject 3-10 Units into the skin 3 (three) times daily. Blood Glucose 150 - 200, give 3 units Blood Glucose 201 - 250, give 5 units Blood Glucose 251 - 300, give 7 units Blood Glucose 301 - 350, give 10 units 10/31/15  Yes Tresa Garter, MD  Insulin Glargine (LANTUS) 100 UNIT/ML Solostar Pen Inject 45 Units into the skin daily at 10 pm. 10/31/15  Yes [name] E Jegede,  MD  Lancet Devices [facility]) lancets Use as instructed 12/20/14  Yes Tresa Garter, MD  lisinopril (PRINIVIL,ZESTRIL) 10 MG tablet Take 1 tablet (10 mg total) by mouth daily. 06/25/15  Yes Arnoldo Morale, MD  metFORMIN (GLUCOPHAGE) 500 MG tablet Take 2 tablets (1,000 mg total) by mouth 2 (two) times daily with a meal. 06/25/15  Yes Arnoldo Morale, MD  omeprazole (PRILOSEC)  40 MG capsule Take 1 capsule (40 mg total) by mouth daily. 10/31/15  Yes Tresa Garter, MD  oxyCODONE-acetaminophen (PERCOCET/ROXICET) 5-325 MG tablet Take 1 tablet by mouth every 6 (six) hours as needed for severe pain. 11/06/15  Yes Christopher Lawyer, PA-C  promethazine (PHENERGAN) 25 MG tablet Take 1 tablet (25 mg total) by mouth every 8 (eight) hours as needed for nausea or vomiting. 11/06/15  Yes Dalia Heading, PA-C   Physical Exam: Filed Vitals:   11/09/15 0730 11/09/15 0800 11/09/15 0900 11/09/15 0930  BP: 122/83 129/70 132/70 128/63  Pulse: 114 110 99 108  Temp:      TempSrc:      Resp: _0 SpO2: 99% 92% 93%     Wt Readings from Last 3 Encounters:  10/31/15 111.585 kg (246 lb)  10/18/15 121.11 kg (267 lb)  10/16/15 122.471 kg (270 lb)    General:  Appears calm and comfortable Eyes: PERRL, normal lids, irises & conjunctiva ENT: grossly normal hearing, his membranes of his mouth are pink but very dry Neck: no LAD, masses or thyromegaly Cardiovascular: Tachycardic, no m/r/g. No LE edema.  Respiratory: CTA bilaterally, no w/r/r. Normal respiratory effort. Abdomen: soft, nondistended very very sluggish bowel sounds diffuse mild tenderness throughout Skin: molluscum contagiosum rash UE Musculoskeletal: grossly normal tone BUE/BLE joints without swelling/erythema Psychiatric: grossly normal mood and affect, speech fluent and appropriate Neurologic: grossly non-focal. Speech clear facial symmetry           Labs on Admission:  Basic Metabolic Panel:  Recent Labs Lab 11/06/15 1224 11/09/15 0740  NA 138 135  K 4.5 4.2  CL 98* 93*  CO2 25 21*  GLUCOSE 355* 297*  BUN 9 16  CREATININE 1.15 1.33*  CALCIUM 10.2 9.9   Liver Function Tests:  Recent Labs Lab 11/06/15 1224 11/09/15 0740  AST 14* 11*  ALT 15* 11*  ALKPHOS 85 90  BILITOT 1.1 1.7*  PROT 7.2 7.6  ALBUMIN 4.2 3.5    Recent Labs Lab 11/06/15 1224 11/09/15 0740  LIPASE 47 30   No  results for input(s): AMMONIA in the last 168 hours. CBC:  Recent Labs Lab 11/06/15 1224 11/09/15 0740  WBC 16.4* 7.0  NEUTROABS  --  4.4  HGB 14.9 13.6  HCT 42.3 39.9  MCV 80.0 81.6  PLT 432* 288   Cardiac Enzymes: No results for input(s): CKTOTAL, CKMB, CKMBINDEX, TROPONINI in the last 168 hours.  BNP (last 3 results) No results for input(s): BNP in the last 8760 hours.  ProBNP (last 3 results) No results for input(s): PROBNP in the last 8760 hours.  CBG: No results for input(s): GLUCAP in the last 168 hours.  Radiological Exams on Admission: No results found.  EKG:   Assessment/Plan Principal Problem:   Abdominal pain Active Problems:   Diabetes ([place])   Hypertension   Pancreatitis, acute   Gastroesophageal reflux disease without esophagitis   Nausea and vomiting   Pancreatitis   Acute kidney injury ([place])   Tobacco use  #1. Abdominal pain/nausea and vomiting. Likely related to gastroparesis secondary  to uncontrolled diabetes in the setting of chronic pancreatitis/pseudocyst per recent CT. History hypertriglyceridemia and EtOH abuse. Lipase within the limits of normal. -Admit to MedSurg -Fluid resuscitation -Reglan -scehduled Zofran -dilaudid for pain -diabetes control -lipid panel -npo  #2. Mild DKA. He shouldn't confesses to noncompliance with diabetes management. AG 21. Serum glucose on admission 297. -Provide 5 units subcutaneous insulin in the emergency department -Sliding scale insulin -Continue home Lantus at a lesser dose -Nothing by mouth -Diabetes coordinator consult -bmet at 12 and 4pm -If no improvement will consider insulin gtt  3. Acute kidney injury. Likely secondary to dehydration related to above. -Vigorous IV fluids -hold nephrotoxins -Monitor urine output  4. Hypertension. Fair control. Home medications include lisinopril -Hold lisinopril for now -Provide when necessary hydralazine -Pain control  #5. History of  hypertrigliceridemia. Noncompliance reported per patient - home medications -Obtain lipid panel  Code Status: full DVT Prophylaxis: Family Communication: none present Disposition Plan: home 2-3 days  Time spent: 64 minutes  [facility]    I have examined the patient, reviewed the chart and modified the above note which I agree with. Have discussed strict control of sugars- he has not been taking insulin due to "{feeling sick". I have explained the need to continue Lantus at the minimum even while vomiting to prevent DKA. Conservative management with antiemetics, IVF, resumption of Insulin and use of Reglan for vomiting. May need gastric emptying scan vs a trial of oral Regaln 30 min before meals to be prescribed at discharge.  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  Tiesha Marich,MD Pager # [phone] [email or web address] 11/09/2015, 11:24 AM

## 2015-11-09 NOTE — Progress Notes (Signed)
Admission note:  Arrival Method: Wheelchair from ED Mental Orientation: A&OX4 Telemetry: N/A Assessment: see Flowsheet Skin: Rash Bilateral upper and lower extremities IV: L A/C Pain: 6/10 Ab pain Tubes:  Safety Measures:  Fall Prevention Safety Plan: Reviewed with pt Admission Screening: completed 6700 Orientation: Patient has been oriented to the unit, staff and to the room.  Orders have been reviewed and implemented. Will continue to assess and monitor pt.   Brent Costa

## 2015-11-10 ENCOUNTER — Inpatient Hospital Stay (HOSPITAL_COMMUNITY): Payer: Self-pay

## 2015-11-10 DIAGNOSIS — E109 Type 1 diabetes mellitus without complications: Secondary | ICD-10-CM

## 2015-11-10 DIAGNOSIS — R112 Nausea with vomiting, unspecified: Secondary | ICD-10-CM

## 2015-11-10 LAB — GLUCOSE, CAPILLARY
GLUCOSE-CAPILLARY: 190 mg/dL — AB (ref 65–99)
GLUCOSE-CAPILLARY: 167 mg/dL — AB (ref 65–99)
GLUCOSE-CAPILLARY: 156 mg/dL — AB (ref 65–99)
Glucose-Capillary: 161 mg/dL — ABNORMAL HIGH (ref 65–99)

## 2015-11-10 LAB — BASIC METABOLIC PANEL
Anion gap: 11 (ref 5–15)
BUN: 8 mg/dL (ref 6–20)
CALCIUM: 8.6 mg/dL — AB (ref 8.9–10.3)
CO2: 23 mmol/L (ref 22–32)
CREATININE: 0.9 mg/dL (ref 0.61–1.24)
Chloride: 101 mmol/L (ref 101–111)
GFR calc non Af Amer: >60 mL/min (ref >60)
Glucose, Bld: 204 mg/dL — ABNORMAL HIGH (ref 65–99)
Potassium: 3.9 mmol/L (ref 3.5–5.1)
Sodium: 135 mmol/L (ref 135–145)

## 2015-11-10 LAB — URINALYSIS, ROUTINE W REFLEX MICROSCOPIC
Glucose, UA: NEGATIVE mg/dL
KETONES UR: 15 mg/dL — AB
NITRITE: POSITIVE — AB
PROTEIN: 100 mg/dL — AB
Specific Gravity, Urine: 1.027 (ref 1.005–1.030)
pH: 5.5 (ref 5.0–8.0)

## 2015-11-10 LAB — URINE MICROSCOPIC-ADD ON

## 2015-11-10 LAB — CBC
HCT: 34.9 % — ABNORMAL LOW (ref 39.0–52.0)
Hemoglobin: 11.9 g/dL — ABNORMAL LOW (ref 13.0–17.0)
MCH: 27.5 pg (ref 26.0–34.0)
MCHC: 34.1 g/dL (ref 30.0–36.0)
MCV: 80.6 fL (ref 78.0–100.0)
PLATELETS: 276 10*3/uL (ref 150–400)
RBC: 4.33 MIL/uL (ref 4.22–5.81)
RDW: 13.3 % (ref 11.5–15.5)
WBC: 6.4 10*3/uL (ref 4.0–10.5)

## 2015-11-10 MED ORDER — PANTOPRAZOLE SODIUM 40 MG PO TBEC
40.0000 mg | DELAYED_RELEASE_TABLET | Freq: Every day | ORAL | Status: DC
Start: 2015-11-11 — End: 2015-11-12
  Administered 2015-11-11 – 2015-11-12 (×2): 40 mg via ORAL
  Filled 2015-11-10 (×2): qty 1

## 2015-11-10 MED ORDER — LEVOFLOXACIN 500 MG PO TABS
250.0000 mg | ORAL_TABLET | ORAL | Status: DC
  Administered 2015-11-10: 250 mg via ORAL
  Filled 2015-11-10: qty 1

## 2015-11-10 NOTE — Progress Notes (Signed)
Pharmacy Antibiotic Note Brent Costa is a 25 y.o. male admitted on 11/09/2015 with UTI. Pharmacy has been consulted for levaquin dosing.  Plan: Levaquin 250 mg daily pending results of u/a and urine culture  Weight: 239 lb 3.2 oz (108.5 kg)  Temp (24hrs), Avg:100.6 F (38.1 C), Min:99 F (37.2 C), Max:102.9 F (39.4 C)   Recent Labs Lab 11/06/15 1224 11/09/15 0740 11/09/15 1550 11/09/15 2246 11/10/15 0605  WBC 16.4* 7.0  --   --  6.4  CREATININE 1.15 1.33* 1.03 1.04 0.90     No Known Allergies  Antimicrobials this admission: 2/5 Levaquin  >>   Dose adjustments this admission: n/a  Microbiology results: 2/5 UCx: px    Thank you for allowing pharmacy to be a part of this patient's care.  Pollyann Samples, PharmD, BCPS 11/10/2015, 7:03 PM Pager: [phone]

## 2015-11-10 NOTE — Progress Notes (Signed)
Pt complained of R flank pain, increased urgency and pain with urination. Pt temp was 102.9. Given PRN Tylenol. MD paged. New orders given. Will continue to assess and monitor pt.   Lujean Amel

## 2015-11-10 NOTE — Progress Notes (Addendum)
[facility] Progress Note   Brent Costa  ZOX:[id number]  DOB: 05/26/1991  DOA: 11/09/2015 PCP: Jeanann Lewandowsky, MD  Brief narrative: Brent Costa is a 25 y.o. male with a past medical history of type 1 diabetes, hypertension, hypertriglyceridemia, GERD, molluscum contagiosum presents to the emergency department with the chief complaint of persistent worsening abdominal pain as well as persistent nausea and intermittent vomiting. Initial evaluation reveals mild DKA concern for pancreatitis, hypertension and tachycardia.   Subjective: No longer vomiting but feels a little nausea. Abdomen is quite sore. No diarrhea. Noted to have a fever of 101.5 today. He states that he was diaphoretic. No complaint of runny nose sore throat or cough. No dysuria. No rash, sinus drainage or earache.  Assessment/Plan: Principal Problem:   Abdominal pain and vomiting -Symptoms resolved for now-he is tolerating clear liquids and therefore I will advance his diet -Have asked him to take small meals and back down to clear liquids if solid food is making him nauseated -Continue IV Reglan for today-we will need to stop Reglan tomorrow and continue to monitor him without it to see if his symptoms recur-due to the fact that he has uncontrolled diabetes, he may now be developing diabetic gastroparesis -may need to be discharged home with a prescription for Reglan prior to meals-discussed with him the risk of developing gastroparesis if his sugars remain uncontrolled  Active Problems: Fever of 101 this morning -Based on history, no signs or symptoms of infection unless it is GI related- his GI symptoms have improved for now, we'll hold off on obtaining any imaging CT recently had a CT scan- see below in regards to pancreatic pseudocyst -abdomen is diffusely tender but suspect this is due to days of vomiting that he has had -Follow temperature and white blood cell count which is normal at this  time -Follow up blood cultures which are negative to date - we'll hold off on antibiotics for now and continue to follow for recurrence of fevers Addendum 6:10 PM- now having flank pain, dysuria and recurrent fever- check UA/ U culture and start Levaquin  Recent Pancreatitis, acute -Repeat CT scan on 2/1 was suggestive of a pancreatic pseudocyst measuring 5 x 7 x 11 cm - ? infected pseudocyst - follow UA  Uncontrolled diabetes mellitus type 1 -A1c 12.4 on 10/17/15 -Have resumed his Lantus at a lower dose which he was not taking at home because he was continuing to vomit -Have explained to him that he needs to take his Lantus at a lower dose even if he is not eating to prevent from developing DKA-we'll need to reiterate this prior to discharge    Acute kidney injury -Likely prerenal and improved with IV fluids    Tobacco use -Counseled him to discontinue   Antibiotics: Anti-infectives    None     Code Status:     Code Status Orders        Start     Ordered   11/09/15 0943  Full code   Continuous     11/09/15 0946    Code Status History    Date Active Date Inactive Code Status Order ID Comments User Context   10/19/2015 10:29 AM 10/26/2015  8:36 PM Full Code [id number]  Eddie North, MD Inpatient   10/18/2015 11:43 AM 10/19/2015 10:29 AM Full Code [id number]  Eddie North, MD Inpatient   10/16/2015 11:38 PM 10/18/2015 11:43 AM Full Code [id number]  Lorretta Harp, MD ED   11/20/2013  7:34 AM  11/24/2013  5:31 PM Full Code [id number]  Eduard Clos, MD Inpatient     Family Communication:   Disposition Plan: follow for recurrence of fevers DVT prophylaxis:  Lovenox Consultants:  Procedures:     Objective: Filed Weights   11/10/15 0500  Weight: 108.5 kg (239 lb 3.2 oz)    Intake/Output Summary (Last 24 hours) at 11/10/15 1229 Last data filed at 11/10/15 1102  Gross per 24 hour  Intake 2140.83 ml  Output    125 ml  Net 2015.83 ml     Vitals Filed Vitals:    11/09/15 1831 11/09/15 2100 11/10/15 0500 11/10/15 0820  BP: 113/37 140/43 127/75 134/70  Pulse: 73 117 119 104  Temp:  99 F (37.2 C) 101.5 F (38.6 C) 99.4 F (37.4 C)  TempSrc:  Oral Oral Oral  Resp:  Weight:   108.5 kg (239 lb 3.2 oz)   SpO2:  97% 98% 97%    Exam:  General:  Pt is alert, not in acute distress  HEENT: No icterus, No thrush, oral mucosa moist  Cardiovascular: regular rate and rhythm, S1/S2 No murmur  Respiratory: clear to auscultation bilaterally   Abdomen: Soft, +Bowel sounds, tender diffusely, non distended, no guarding  MSK: No cyanosis or clubbing- no pedal edema   Data Reviewed: Basic Metabolic Panel:  Recent Labs Lab 11/06/15 1224 11/09/15 0740 11/09/15 1550 11/09/15 2246 11/10/15 0605  NA 138 135 140 135 135  K 4.5 4.2 3.4* 3.9 3.9  CL 98* 93* 98* 97* 101  CO2 25 21* GLUCOSE 355* 297* 119* 251* 204*  BUN CREATININE 1.15 1.33* 1.03 1.04 0.90  CALCIUM 10.2 9.9 8.8* 8.5* 8.6*   Liver Function Tests:  Recent Labs Lab 11/06/15 1224 11/09/15 0740  AST 14* 11*  ALT 15* 11*  ALKPHOS 85 90  BILITOT 1.1 1.7*  PROT 7.2 7.6  ALBUMIN 4.2 3.5    Recent Labs Lab 11/06/15 1224 11/09/15 0740  LIPASE 47 30   No results for input(s): AMMONIA in the last 168 hours. CBC:  Recent Labs Lab 11/06/15 1224 11/09/15 0740 11/10/15 0605  WBC 16.4* 7.0 6.4  NEUTROABS  --  4.4  --   HGB 14.9 13.6 11.9*  HCT 42.3 39.9 34.9*  MCV 80.0 81.6 80.6  PLT 432* 288 276   Cardiac Enzymes: No results for input(s): CKTOTAL, CKMB, CKMBINDEX, TROPONINI in the last 168 hours. BNP (last 3 results) No results for input(s): BNP in the last 8760 hours.  ProBNP (last 3 results) No results for input(s): PROBNP in the last 8760 hours.  CBG:  Recent Labs Lab 11/09/15 1125 11/09/15 1710 11/09/15 2028 11/10/15 0818 11/10/15 1159  GLUCAP 245* 124* 203* 190* 167*    No results found for this or any previous visit  (from the past 240 hour(s)).   Studies: Dg Chest Port 1 View  11/10/2015  CLINICAL DATA:  Fever EXAM: PORTABLE CHEST 1 VIEW COMPARISON:  10/18/2015 FINDINGS: The heart size and mediastinal contours are within normal limits. Both lungs are clear. The visualized skeletal structures are unremarkable. IMPRESSION: No active disease. Electronically Signed   By: Marlan Palau M.D.   On: 11/10/2015 10:17    Scheduled Meds:  Scheduled Meds: . enoxaparin (LOVENOX) injection  30 mg Subcutaneous Q24H  . insulin aspart  0-15 Units Subcutaneous TID WC  . insulin aspart  0-5 Units Subcutaneous QHS  . insulin glargine  30  Units Subcutaneous QHS  . metoCLOPramide (REGLAN) injection  10 mg Intravenous 4 times per day  . ondansetron (ZOFRAN) IV  4 mg Intravenous Q4H  . pantoprazole (PROTONIX) IV  40 mg Intravenous Q24H   Continuous Infusions:   Time spent on care of this patient: 35 min   Dona Klemann, MD 11/10/2015, 12:29 PM  LOS: 1 day   [facility] Office  [phone] Pager - Text Page per [email or web address] If 7PM-7AM, please contact night-coverage [email or web address]

## 2015-11-11 LAB — URINE CULTURE: CULTURE: NO GROWTH

## 2015-11-11 LAB — GLUCOSE, CAPILLARY
GLUCOSE-CAPILLARY: 178 mg/dL — AB (ref 65–99)
Glucose-Capillary: 135 mg/dL — ABNORMAL HIGH (ref 65–99)
Glucose-Capillary: 140 mg/dL — ABNORMAL HIGH (ref 65–99)
Glucose-Capillary: 151 mg/dL — ABNORMAL HIGH (ref 65–99)

## 2015-11-11 LAB — CBC
HEMATOCRIT: 32.6 % — AB (ref 39.0–52.0)
Hemoglobin: 11.1 g/dL — ABNORMAL LOW (ref 13.0–17.0)
MCH: 27.4 pg (ref 26.0–34.0)
MCHC: 34 g/dL (ref 30.0–36.0)
MCV: 80.5 fL (ref 78.0–100.0)
Platelets: 256 10*3/uL (ref 150–400)
RBC: 4.05 MIL/uL — ABNORMAL LOW (ref 4.22–5.81)
RDW: 13.3 % (ref 11.5–15.5)
WBC: 6.6 10*3/uL (ref 4.0–10.5)

## 2015-11-11 LAB — BASIC METABOLIC PANEL
Anion gap: 15 (ref 5–15)
CHLORIDE: 97 mmol/L — AB (ref 101–111)
CO2: 23 mmol/L (ref 22–32)
CREATININE: 1 mg/dL (ref 0.61–1.24)
Calcium: 8.7 mg/dL — ABNORMAL LOW (ref 8.9–10.3)
GFR calc Af Amer: >60 mL/min (ref >60)
GLUCOSE: 190 mg/dL — AB (ref 65–99)
POTASSIUM: 3.5 mmol/L (ref 3.5–5.1)
Sodium: 135 mmol/L (ref 135–145)

## 2015-11-11 LAB — LIPASE, BLOOD: Lipase: 30 U/L (ref 11–51)

## 2015-11-11 MED ORDER — ENOXAPARIN SODIUM 60 MG/0.6ML ~~LOC~~ SOLN
0.5000 mg/kg | SUBCUTANEOUS | Status: DC
  Administered 2015-11-12: 55 mg via SUBCUTANEOUS
  Filled 2015-11-11: qty 0.6

## 2015-11-11 MED ORDER — GLUCERNA SHAKE PO LIQD
237.0000 mL | Freq: Two times a day (BID) | ORAL | Status: DC
  Administered 2015-11-12: 237 mL via ORAL

## 2015-11-11 MED ORDER — DEXTROSE 5 % IV SOLN
1.0000 g | INTRAVENOUS | Status: DC
  Administered 2015-11-11: 1 g via INTRAVENOUS
  Filled 2015-11-11 (×2): qty 10

## 2015-11-11 NOTE — Progress Notes (Addendum)
[facility] PROGRESS NOTE  Brent Costa RUE:[id number] DOB: 04/15/1991 DOA: 11/09/2015 PCP: Jeanann Lewandowsky, MD  Assessment/Plan: Principal Problem:   Abdominal pain - Most likely combination of UTI and diabetic gastroparesis. - Continue supportive therapy  Active Problems:  UTI -Follow up with urine culture. Patient currently on Levaquin    Diabetes ([facility]) -Continue Lantus and sliding scale insulin - Continue diabetic diet    Gastroesophageal reflux disease without esophagitis - Continue PPI   Nausea and vomiting   Pancreatitis - Lipase within normal limits   Acute kidney injury ([facility]) - Serum creatinine within normal limits   Tobacco use - Recommend cessation  Code Status: Full Family Communication: No family at bedside Disposition Plan: Pending improvement in condition   Consultants:  None  Procedures:  None  Antibiotics:  Levaquin  HPI/Subjective: Patient has no new complaints.  Objective: Filed Vitals:   11/11/15 0443 11/11/15 0759  BP: 143/83 127/57  Pulse: 115 103  Temp: 99.8 F (37.7 C) 99.1 F (37.3 C)  Resp: 19 19    Intake/Output Summary (Last 24 hours) at 11/11/15 1510 Last data filed at 11/11/15 0900  Gross per 24 hour  Intake    480 ml  Output    125 ml  Net    355 ml   Filed Weights   11/10/15 0500 11/10/15 2103  Weight: 108.5 kg (239 lb 3.2 oz) 108.4 kg (238 lb 15.7 oz)    Exam:   General:  Patient in no acute distress, alert and awake  Cardiovascular: Regular rate and rhythm, no murmurs or rubs  Respiratory: No increased work of breathing, no wheezes  Abdomen: Soft, nondistended, tenderness with deep palpation suprapubic area  Musculoskeletal: No cyanosis or clubbing   Data Reviewed: Basic Metabolic Panel:  Recent Labs Lab 11/09/15 0740 11/09/15 1550 11/09/15 2246 11/10/15 0605 11/11/15 0636  NA 135 140 135 135 135  K 4.2 3.4* 3.9 3.9 3.5  CL 93* 98* 97* 101 97*  CO2 21* GLUCOSE  297* 119* 251* 204* 190*  BUN <5*  CREATININE 1.33* 1.03 1.04 0.90 1.00  CALCIUM 9.9 8.8* 8.5* 8.6* 8.7*   Liver Function Tests:  Recent Labs Lab 11/06/15 1224 11/09/15 0740  AST 14* 11*  ALT 15* 11*  ALKPHOS 85 90  BILITOT 1.1 1.7*  PROT 7.2 7.6  ALBUMIN 4.2 3.5    Recent Labs Lab 11/06/15 1224 11/09/15 0740 11/11/15 0636  LIPASE 47 30 30   No results for input(s): AMMONIA in the last 168 hours. CBC:  Recent Labs Lab 11/06/15 1224 11/09/15 0740 11/10/15 0605 11/11/15 0636  WBC 16.4* 7.0 6.4 6.6  NEUTROABS  --  4.4  --   --   HGB 14.9 13.6 11.9* 11.1*  HCT 42.3 39.9 34.9* 32.6*  MCV 80.0 81.6 80.6 80.5  PLT 432* 288 276 256   Cardiac Enzymes: No results for input(s): CKTOTAL, CKMB, CKMBINDEX, TROPONINI in the last 168 hours. BNP (last 3 results) No results for input(s): BNP in the last 8760 hours.  ProBNP (last 3 results) No results for input(s): PROBNP in the last 8760 hours.  CBG:  Recent Labs Lab 11/10/15 1159 11/10/15 1708 11/10/15 2102 11/11/15 0747 11/11/15 1105  GLUCAP 167* 156* 161* 178* 135*    Recent Results (from the past 240 hour(s))  Culture, blood (Routine X 2) w Reflex to ID Panel     Status: None (Preliminary result)   Collection Time: 11/10/15  6:05  AM  Result Value Ref Range Status   Specimen Description BLOOD RIGHT ANTECUBITAL  Final   Special Requests BOTTLES DRAWN AEROBIC ONLY 5CCS  Final   Culture NO GROWTH 1 DAY  Final   Report Status PENDING  Incomplete  Culture, blood (Routine X 2) w Reflex to ID Panel     Status: None (Preliminary result)   Collection Time: 11/10/15  6:16 AM  Result Value Ref Range Status   Specimen Description BLOOD RIGHT HAND  Final   Special Requests AEROCOCCUS SPECIES 6CCS  Final   Culture NO GROWTH 1 DAY  Final   Report Status PENDING  Incomplete  Urine culture     Status: None   Collection Time: 11/10/15  6:42 PM  Result Value Ref Range Status   Specimen Description URINE, CLEAN  CATCH  Final   Special Requests NONE  Final   Culture NO GROWTH 1 DAY  Final   Report Status 11/11/2015 FINAL  Final     Studies: Dg Chest Port 1 View  11/10/2015  CLINICAL DATA:  Fever EXAM: PORTABLE CHEST 1 VIEW COMPARISON:  10/18/2015 FINDINGS: The heart size and mediastinal contours are within normal limits. Both lungs are clear. The visualized skeletal structures are unremarkable. IMPRESSION: No active disease. Electronically Signed   By: Marlan Palau M.D.   On: 11/10/2015 10:17    Scheduled Meds: . [START ON 11/12/2015] enoxaparin (LOVENOX) injection  0.5 mg/kg Subcutaneous Q24H  . feeding supplement (GLUCERNA SHAKE)  237 mL Oral BID BM  . insulin aspart  0-15 Units Subcutaneous TID WC  . insulin aspart  0-5 Units Subcutaneous QHS  . insulin glargine  30 Units Subcutaneous QHS  . levofloxacin  250 mg Oral Q24H  . metoCLOPramide (REGLAN) injection  10 mg Intravenous 4 times per day  . ondansetron (ZOFRAN) IV  4 mg Intravenous Q4H  . pantoprazole  40 mg Oral Daily   Continuous Infusions:    Time spent: > 35 minutes   Penny Pia  [facility] Pager [phone] If 7PM-7AM, please contact night-coverage at [email or web address], password [facility] 11/11/2015, 3:10 PM  LOS: 2 days    Addendum: Patient still spiking fevers and on oral Levaquin. Will transition from oral Levaquin to IV Rocephin.

## 2015-11-11 NOTE — Care Management Note (Signed)
Case Management Note  Patient Details  Name: WALLICE GRANVILLE MRN: [id number] Date of Birth: [birth year]  Subjective/Objective:            CM following for progression and d/c planning.        Action/Plan: 11/11/15 Noted CM referral for medication assistance, also noted that pt is active the [facility] and [facility] and last appointment was 10/31/2015. Pt therefore has access to [facility] pharmacy. Per records pt was last given [facility] letter to receive meds for $3 for 30 day supply in 2015. This CM , will provide another [facility] letter for this visit to assist pt with meds.  Met with pt who is active with [facility], however has failed to renew his "[employer]" this CM encouraged the pt to renew the card with the assistance of [facility], also arranged a hospital follow up appointment for Nov 28, 2015 @ 3pm. Pt informed and [place] letter placed in front of shadow chart.   Expected Discharge Date:  11/11/15               Expected Discharge Plan:  Home/Self Care  In-House Referral:  NA  Discharge planning Services  CM Consult, [facility], [facility]  Post Acute Care Choice:  NA Choice offered to:  NA  DME Arranged:    DME Agency:     HH Arranged:    HH Agency:     Status of Service:  In process, will continue to follow  Medicare Important Message Given:    Date Medicare IM Given:    Medicare IM give by:    Date Additional Medicare IM Given:    Additional Medicare Important Message give by:     If discussed at [place] of Stay Meetings, dates discussed:    Additional Comments:  Adron Bene, RN 11/11/2015, 11:53 AM

## 2015-11-11 NOTE — Progress Notes (Signed)
Initial Nutrition Assessment  DOCUMENTATION CODES:   Obesity unspecified  INTERVENTION:  Provide Glucerna Shake po BID, each supplement provides 220 kcal and 10 grams of protein.  Encourage adequate PO intake.   NUTRITION DIAGNOSIS:   Inadequate oral intake related to nausea as evidenced by per patient/family report.  GOAL:   Patient will meet greater than or equal to 90% of their needs  MONITOR:   PO intake, Supplement acceptance, Weight trends, Labs, I & O's  REASON FOR ASSESSMENT:   Malnutrition Screening Tool    ASSESSMENT:   25 y.o. male with a past medical history of type 1 diabetes, hypertension, hypertriglyceridemia, GERD, molluscum contagiosum presents to the emergency department with the chief complaint of persistent worsening abdominal pain as well as persistent nausea and intermittent vomiting. Initial evaluation reveals mild DKA concern for pancreatitis, hypertension and tachycardia.  Pt reports having abdominal pains. Meal completion has been 50-75%. Pt reports having poor po intake for 2-3 days PTA as foods/liquids would not stay down, however usually prior to current illness, pt reports eating well with at least 3 meals daily. Usual body weight reported to be ~270 lbs. Per Epic weight records, pt with a 10.8% weight loss in 1 month. Pt is agreeable to Glucerna Shake to aid in caloric and protein needs as well as in prevention of further weight loss. RD to order. Pt with encouraged to eat his food at meals.   Pt with no observed significant fat or muscle mass loss.   Labs and medications reviewed.   Diet Order:  Diet Carb Modified Fluid consistency:: Thin; Room service appropriate?: Yes  Skin:  Reviewed, no issues  Last BM:  2/2  Height:   Ht Readings from Last 1 Encounters:  10/31/15  (1.753 m)    Weight:   Wt Readings from Last 1 Encounters:  11/10/15 238 lb 15.7 oz (108.4 kg)    Ideal Body Weight:  72.72 kg  BMI:  Body mass index is  35.27 kg/(m^2).  Estimated Nutritional Needs:   Kcal:  1900-2100  Protein:  100-110 grams  Fluid:  1.9 - 2.1 L/day  EDUCATION NEEDS:   No education needs identified at this time  Roslyn Smiling, MS, RD, LDN Pager # [phone] After hours/ weekend pager # [phone]

## 2015-11-12 ENCOUNTER — Encounter (HOSPITAL_COMMUNITY): Payer: Self-pay

## 2015-11-12 DIAGNOSIS — R1110 Vomiting, unspecified: Secondary | ICD-10-CM | POA: Insufficient documentation

## 2015-11-12 DIAGNOSIS — E669 Obesity, unspecified: Secondary | ICD-10-CM | POA: Insufficient documentation

## 2015-11-12 DIAGNOSIS — E109 Type 1 diabetes mellitus without complications: Secondary | ICD-10-CM | POA: Insufficient documentation

## 2015-11-12 DIAGNOSIS — R1011 Right upper quadrant pain: Secondary | ICD-10-CM | POA: Insufficient documentation

## 2015-11-12 DIAGNOSIS — I10 Essential (primary) hypertension: Secondary | ICD-10-CM | POA: Insufficient documentation

## 2015-11-12 LAB — COMPREHENSIVE METABOLIC PANEL
ALBUMIN: 3.3 g/dL — AB (ref 3.5–5.0)
ALK PHOS: 115 U/L (ref 38–126)
ALT: 22 U/L (ref 17–63)
AST: 17 U/L (ref 15–41)
Anion gap: 19 — ABNORMAL HIGH (ref 5–15)
BILIRUBIN TOTAL: 1.1 mg/dL (ref 0.3–1.2)
BUN: 9 mg/dL (ref 6–20)
CO2: 22 mmol/L (ref 22–32)
CREATININE: 1.21 mg/dL (ref 0.61–1.24)
Calcium: 9.7 mg/dL (ref 8.9–10.3)
Chloride: 96 mmol/L — ABNORMAL LOW (ref 101–111)
GFR calc Af Amer: >60 mL/min (ref >60)
GLUCOSE: 214 mg/dL — AB (ref 65–99)
POTASSIUM: 3.5 mmol/L (ref 3.5–5.1)
Sodium: 137 mmol/L (ref 135–145)
TOTAL PROTEIN: 8.1 g/dL (ref 6.5–8.1)

## 2015-11-12 LAB — CBC
HEMATOCRIT: 37.2 % — AB (ref 39.0–52.0)
HEMOGLOBIN: 12.6 g/dL — AB (ref 13.0–17.0)
MCH: 26.8 pg (ref 26.0–34.0)
MCHC: 33.9 g/dL (ref 30.0–36.0)
MCV: 79.1 fL (ref 78.0–100.0)
Platelets: 349 10*3/uL (ref 150–400)
RBC: 4.7 MIL/uL (ref 4.22–5.81)
RDW: 13.3 % (ref 11.5–15.5)
WBC: 7.4 10*3/uL (ref 4.0–10.5)

## 2015-11-12 LAB — LIPASE, BLOOD: Lipase: 30 U/L (ref 11–51)

## 2015-11-12 LAB — GLUCOSE, CAPILLARY
GLUCOSE-CAPILLARY: 165 mg/dL — AB (ref 65–99)
Glucose-Capillary: 145 mg/dL — ABNORMAL HIGH (ref 65–99)

## 2015-11-12 MED ORDER — ONDANSETRON 4 MG PO TBDP
ORAL_TABLET | ORAL | Status: AC
  Filled 2015-11-12: qty 1

## 2015-11-12 MED ORDER — CEPHALEXIN 500 MG PO CAPS: 500.0000 mg | ORAL_CAPSULE | Freq: Two times a day (BID) | ORAL | Status: DC

## 2015-11-12 MED ORDER — FENTANYL CITRATE (PF) 100 MCG/2ML IJ SOLN
50.0000 ug | Freq: Once | INTRAMUSCULAR | Status: AC
  Administered 2015-11-12: 50 ug via NASAL

## 2015-11-12 MED ORDER — PROMETHAZINE HCL 25 MG PO TABS: 25.0000 mg | ORAL_TABLET | Freq: Three times a day (TID) | ORAL | Status: DC | PRN

## 2015-11-12 MED ORDER — ONDANSETRON 4 MG PO TBDP
4.0000 mg | ORAL_TABLET | Freq: Once | ORAL | Status: AC
  Administered 2015-11-12: 4 mg via ORAL

## 2015-11-12 MED ORDER — ACETAMINOPHEN 325 MG PO TABS: 650.0000 mg | ORAL_TABLET | Freq: Four times a day (QID) | ORAL | Status: DC | PRN

## 2015-11-12 MED ORDER — FENTANYL CITRATE (PF) 100 MCG/2ML IJ SOLN
INTRAMUSCULAR | Status: AC
  Filled 2015-11-12: qty 2

## 2015-11-12 NOTE — ED Notes (Signed)
PER EMS: pt reporting RUQ abdominal pain onset 2 weeks ago and reports vomiting off and on for these 2 weeks. Hx of Pancreatitis. Was seen here same earlier today and to come back if pain worsened. BP-142/84, HR-108, O2-99% RA.

## 2015-11-12 NOTE — Discharge Summary (Signed)
Physician Discharge Summary  Brent Costa TRV:[id number] DOB: 1991-04-15 DOA: 11/09/2015  PCP: Brent Chessman, MD  Admit date: 11/09/2015 Discharge date: 11/12/2015  Time spent: > 35 minutes  Recommendations for Outpatient Follow-up:  1. Please monitor blood sugars and continue to encourage compliance with diabetic diet and home medication regimen   Discharge Diagnoses:  Principal Problem:   Abdominal pain Active Problems:   Diabetes ([place])   Hypertension   Pancreatitis, acute   Gastroesophageal reflux disease without esophagitis   Nausea and vomiting   Pancreatitis   Acute kidney injury ([place])   Tobacco use   Discharge Condition: Stable  Diet recommendation: Diabetic diet  Filed Weights   11/10/15 0500 11/10/15 2103  Weight: 108.5 kg (239 lb 3.2 oz) 108.4 kg (238 lb 15.7 oz)    History of present illness:  25 year old with history of diabetes who presents to the hospital complaining of abdominal pain. Presumed to be secondary to diabetic gastroparesis and UTI.  Hospital Course:  UTI - Will discharge on 6 more days of cephalosporin to complete a seven-day treatment total - Provide prescription for antiemetic on discharge for continued supportive therapy  For other known comorbidities listed above will continue medication list below  Procedures:  None  Consultations:  None  Discharge Exam: Filed Vitals:   11/11/15 2129 11/12/15 0531  BP: 131/72 154/78  Pulse: 102 96  Temp: 99.5 F (37.5 C) 99.2 F (37.3 C)  Resp: 16 18    General: Patient in no acute distress, alert and awake Cardiovascular: Rate and rhythm, no murmurs rubs Respiratory: No increased work of breathing, no wheezes Abdomen: Nondistended, no guarding, nontender  Discharge Instructions   Discharge Instructions    Call MD for:  redness, tenderness, or signs of infection (pain, swelling, redness, odor or green/yellow discharge around incision site)    Complete by:  As directed       Call MD for:  severe uncontrolled pain    Complete by:  As directed      Call MD for:  temperature >100.4    Complete by:  As directed      Diet - low sodium heart healthy    Complete by:  As directed      Discharge instructions    Complete by:  As directed   Please follow up with your primary care physician in 1-2 weeks or sooner should any acute concerns arise     Increase activity slowly    Complete by:  As directed           Current Discharge Medication List    START taking these medications   Details  acetaminophen (TYLENOL) 325 MG tablet Take 2 tablets (650 mg total) by mouth every 6 (six) hours as needed for mild pain (or Fever >/= 101).    cephALEXin (KEFLEX) 500 MG capsule Take 1 capsule (500 mg total) by mouth 2 (two) times daily. Qty: 12 capsule, Refills: 0      CONTINUE these medications which have CHANGED   Details  promethazine (PHENERGAN) 25 MG tablet Take 1 tablet (25 mg total) by mouth every 8 (eight) hours as needed for nausea or vomiting. Qty: 15 tablet, Refills: 0      CONTINUE these medications which have NOT CHANGED   Details  Blood Glucose Monitoring Suppl (ACCU-CHEK AVIVA PLUS) W/DEVICE KIT Check blood sugars 4 times daily (before meals and at bedtime) Qty: 1 kit, Refills: 0    fenofibrate (TRICOR) 145 MG tablet Take 1 tablet (  145 mg total) by mouth daily. Qty: 60 tablet, Refills: 1    glucose blood (ACCU-CHEK AVIVA) test strip Use as instructed Qty: 100 each, Refills: 12    insulin aspart (NOVOLOG) 100 UNIT/ML injection Inject 3-10 Units into the skin 3 (three) times daily. Blood Glucose 150 - 200, give 3 units Blood Glucose 201 - 250, give 5 units Blood Glucose 251 - 300, give 7 units Blood Glucose 301 - 350, give 10 units Qty: 4 vial, Refills: 3   Associated Diagnoses: Diabetes mellitus without complication (HCC)    Insulin Glargine (LANTUS) 100 UNIT/ML Solostar Pen Inject 45 Units into the skin daily at 10 pm. Qty: 15 pen, Refills: 3    Associated Diagnoses: Diabetes mellitus without complication (HCC)    Lancet Devices (ACCU-CHEK SOFTCLIX) lancets Use as instructed Qty: 1 each, Refills: 0    lisinopril (PRINIVIL,ZESTRIL) 10 MG tablet Take 1 tablet (10 mg total) by mouth daily. Qty: 30 tablet, Refills: 2   Associated Diagnoses: Type 2 diabetes mellitus with diabetic nephropathy (HCC)    metFORMIN (GLUCOPHAGE) 500 MG tablet Take 2 tablets (1,000 mg total) by mouth 2 (two) times daily with a meal. Qty: 120 tablet, Refills: 2    omeprazole (PRILOSEC) 40 MG capsule Take 1 capsule (40 mg total) by mouth daily. Qty: 30 capsule, Refills: 3   Associated Diagnoses: Gastroesophageal reflux disease without esophagitis    oxyCODONE-acetaminophen (PERCOCET/ROXICET) 5-325 MG tablet Take 1 tablet by mouth every 6 (six) hours as needed for severe pain. Qty: 15 tablet, Refills: 0       No Known Allergies Follow-up Information    Follow up with [facility].   Why:  Followup appointment, Thursday, Nov 28, 2015 at [facility] information:   [address]  [name] [address] [phone]       The results of significant diagnostics from this hospitalization (including imaging, microbiology, ancillary and laboratory) are listed below for reference.    Significant Diagnostic Studies: US Abdomen Complete  10/17/2015  CLINICAL DATA:  Initial evaluation for acute pancreatitis. EXAM: ABDOMEN ULTRASOUND COMPLETE COMPARISON:  Prior CT from 10/16/2015. FINDINGS: Gallbladder: No gallstones or wall thickening visualized. No sonographic Murphy sign noted by sonographer. Common bile duct: Diameter: 2.8 mm Liver: No focal lesion identified. Within normal limits in parenchymal echogenicity. IVC: No abnormality visualized. Pancreas: Not well visualized, with known inflammatory changes not well seen. Probable duodenal wall thickening noted, similar to previous CT. Spleen: Size and appearance  within normal limits. Right Kidney: Length: 12.8 cm. Echogenicity within normal limits. No mass or hydronephrosis visualized. Left Kidney: Length: 14.0 cm. Echogenicity within normal limits. No mass or hydronephrosis visualized. Abdominal aorta: No aneurysm visualized. Other findings: None. IMPRESSION: 1. Poor visualization of known inflammatory changes within the pancreas, although duodenal wall thickening appears grossly similar relative to recent CT. 2. Otherwise normal abdominal ultrasound. No cholelithiasis, choledocholithiasis, or biliary dilatation. Electronically Signed   By: Jeannine Boga M.D.   On: 10/17/2015 01:00   Ct Abdomen Pelvis W Contrast  11/06/2015  CLINICAL DATA:  Left upper quadrant pain with epigastric pain and nausea for about a week now. Previous history of pancreatitis. EXAM: CT ABDOMEN AND PELVIS WITH CONTRAST TECHNIQUE: Multidetector CT imaging of the abdomen and pelvis was performed using the standard protocol following bolus administration of intravenous contrast. CONTRAST:  174m OMNIPAQUE IOHEXOL 300 MG/ML  SOLN COMPARISON:  10/21/2015 FINDINGS: Lower chest: Trace atelectasis right lung base. Gynecomastia noted bilaterally. Hepatobiliary: No  focal abnormality within the liver parenchyma. There is no evidence for gallstones, gallbladder wall thickening, or pericholecystic fluid. No intrahepatic or extrahepatic biliary dilation. Pancreas: As on prior studies, there is peripancreatic edema and inflammation. Features have progressed in the interval and the phlegmonous change seen on the previous study has become more organized with an evolving 5.8 x 7.8 x 11.1 cm complex fluid collection anterior to the pancreatic head showing areas of rim enhancement. 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 There is some hypoattenuation in the uncinate process of the pancreas likely related to edema/ inflammation. No dilatation of the main pancreatic duct. Pancreatic  parenchyma continues to enhance diffusely. Edema/ inflammation tracks in the anterior para renal space of the retroperitoneal abdomen. Spleen: No focal mass lesion. No dilatation of the main duct. No intraparenchymal cyst. No splenomegaly. No focal mass lesion. Adrenals/Urinary Tract: No adrenal nodule or mass. Kidneys are unremarkable. No evidence for hydroureter. The urinary bladder appears normal for the degree of distention. Stomach/Bowel: Mild thickening in the wall of the gastric antrum is assisted with duodenal wall thickening, changes likely secondary to the retroperitoneal edema/ inflammation. No evidence for gastric outlet obstruction at this time. No small bowel wall thickening. No small bowel dilatation. The terminal ileum is normal. High density material in the appendiceal lumen is new since 10/16/2015 and compatible with oral contrast material from previous CT scan. There is some thickening in the wall of the hepatic flexure, adjacent to the peripancreatic inflammatory process. Left colon is normal in appearance. Vascular/Lymphatic: No abdominal aortic aneurysm. No abdominal atherosclerotic calcification. Portal vein and superior mesenteric vein are patent. Splenic vein remains patent. No evidence for celiac axis or SMA pseudoaneurysm. Persistent left-sided IVC noted. Borderline enlarged lymph nodes are seen in the root of the small bowel mesentery and in the peripancreatic tissues, compatible with the inflammatory process described above. No pelvic sidewall lymphadenopathy. Reproductive: The prostate gland and seminal vesicles have normal imaging features. Other: Trace intraperitoneal free fluid noted. Musculoskeletal: Bone windows reveal no worrisome lytic or sclerotic osseous lesions. IMPRESSION: Interval evolution of the peripancreatic edema/ inflammation with evolving pseudocyst now evident anterior to the head of the pancreas. No evidence for pancreatic necrosis at this time although there is  some hypo attenuation in the uncinate process of the pancreas, likely related to edema/inflammation. No evidence for vascular complication in the upper abdomen. Electronically Signed   By: Misty Stanley M.D.   On: 11/06/2015 15:37   Ct Abdomen Pelvis W Contrast  10/21/2015  CLINICAL DATA:  Followup exam for pancreatitis. Patient with continued abdominal pain and nausea. EXAM: CT ABDOMEN AND PELVIS WITH CONTRAST TECHNIQUE: Multidetector CT imaging of the abdomen and pelvis was performed using the standard protocol following bolus administration of intravenous contrast. CONTRAST:  178m OMNIPAQUE IOHEXOL 300 MG/ML  SOLN COMPARISON:  Abdomen and pelvis CTs, 10/16/2015 and 11/18/2013. FINDINGS: Lung bases: Increased patchy and linear opacity at the right lung base when compared the prior study consistent with increased atelectasis. Heart normal in size. Pancreas: Extensive peripancreatic inflammatory change and fluid extends along the small bowel mesentery and involves the omentum, courses along the gastrohepatic ligament and along the anterior para renal fascia on the right. Inflammatory changes have worsened when compared to the prior CT. Small amount of ascites collects in the pelvis. Pancreas shows diffuse enhancement. The superior mesenteric vein, portal vein and splenic vein remain patent. Liver:  Mildly enlarged.  No liver mass or focal lesion. Spleen:  Unremarkable. Gallbladder and biliary  tree: There is some increased attenuation in the gallbladder without a discrete shadowing stone. No bile duct dilation. Adrenal glands:  Unremarkable. Kidneys, ureters, bladder:  Unremarkable. Lymph nodes:  No adenopathy. Gastrointestinal: Inflammatory changes surround the distal stomach and duodenum. Stomach and duodenum otherwise unremarkable. Normal small bowel. Inflammatory changes also contacts the right colon and transverse colon. Colon otherwise unremarkable. Normal appendix visualized. Musculoskeletal:   Unremarkable. IMPRESSION: 1. Worsened pancreatic inflammation when compared to the prior study. There is greater peripancreatic fluid and inflammatory change. A small amount of ascites has developed since the prior study. 2. No evidence of pancreatic necrosis. No evidence of focal collection to suggest an abscess or pseudocyst. No evidence of venous thrombosis. 3. Increased right lung base atelectasis. Persistent, mild hepatomegaly. Electronically Signed   By: Lajean Manes M.D.   On: 10/21/2015 13:37   Ct Abdomen Pelvis W Contrast  10/16/2015  CLINICAL DATA:  Awoke today with severe mid abdominal pain, nausea and vomiting. EXAM: CT ABDOMEN AND PELVIS WITH CONTRAST TECHNIQUE: Multidetector CT imaging of the abdomen and pelvis was performed using the standard protocol following bolus administration of intravenous contrast. CONTRAST:  52m OMNIPAQUE IOHEXOL 300 MG/ML SOLN, 1046mOMNIPAQUE IOHEXOL 300 MG/ML SOLN COMPARISON:  CT 11/18/2013 FINDINGS: Lower chest:  Mild dependent atelectasis in the right lower lobe. Liver: Prominent size. Diffusely decreased hepatic density consistent with steatosis. No focal lesion. Hepatobiliary: Gallbladder physiologically distended, no calcified stone. No biliary dilatation. Pancreas: Marked soft tissue stranding about the head of the pancreas and in the retroperitoneum. No ductal dilatation. Spleen: Normal in size. Adrenal glands: No nodule. Kidneys: Symmetric renal enhancement. No hydronephrosis. No perinephric stranding. Stomach/Bowel: The stomach is decompressed. There is marked inflammatory change about the duodenum with associated wall thickening. Diffuse soft tissue stranding is seen. Small amount of fluid is scan tracking anterior to the left perirenal fascia. No extraluminal air. Multiple adjacent lymph nodes in the mesentery. Remaining small bowel loops are normal. Small volume of colonic stool. The appendix is normal. Vascular/Lymphatic: No retroperitoneal adenopathy.  Abdominal aorta is normal in caliber. 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 A normal right IVC is demonstrated. Reproductive: Prostate gland normal in size. Bladder: Decompressed.  No pelvic free flow Other: No free air or perforation. Musculoskeletal: There are no acute or suspicious osseous abnormalities. IMPRESSION: 1. Inflammatory changes in the retroperitoneum adjacent to the duodenum and pancreatic head. The overall distribution is similar to that of prior CT, however the degree of inflammation has increased. Differential considerations include duodenitis including peptic ulcer disease versus pancreatitis, possible groove pancreatitis. 2. Hepatic steatosis again seen. Electronically Signed   By: MeJeb Levering.D.   On: 10/16/2015 22:34   Dg Chest Port 1 View  11/10/2015  CLINICAL DATA:  Fever EXAM: PORTABLE CHEST 1 VIEW COMPARISON:  10/18/2015 FINDINGS: The heart size and mediastinal contours are within normal limits. Both lungs are clear. The visualized skeletal structures are unremarkable. IMPRESSION: No active disease. Electronically Signed   By: ChFranchot Gallo.D.   On: 11/10/2015 10:17   Dg Chest Port 1 View  10/18/2015  CLINICAL DATA:  [age]ear old male with fever and weakness EXAM: PORTABLE CHEST 1 VIEW COMPARISON:  Radiograph dated 12/22/2013 FINDINGS: Single portable view of the chest does not demonstrate a focal consolidation. There is no pleural effusion or pneumothorax. The cardiac silhouette is within normal limits. The osseous structures appear unremarkable. IMPRESSION: No active disease. Electronically Signed  By: Anner Crete M.D.   On: 10/18/2015 21:39    Microbiology: Recent Results (from the past 240 hour(s))  Culture, blood (Routine X 2) w Reflex to ID Panel     Status: None (Preliminary result)   Collection Time: 11/10/15  6:05 AM  Result Value Ref Range Status   Specimen Description BLOOD RIGHT  ANTECUBITAL  Final   Special Requests BOTTLES DRAWN AEROBIC ONLY 5CCS  Final   Culture NO GROWTH 1 DAY  Final   Report Status PENDING  Incomplete  Culture, blood (Routine X 2) w Reflex to ID Panel     Status: None (Preliminary result)   Collection Time: 11/10/15  6:16 AM  Result Value Ref Range Status   Specimen Description BLOOD RIGHT HAND  Final   Special Requests AEROCOCCUS SPECIES 6CCS  Final   Culture NO GROWTH 1 DAY  Final   Report Status PENDING  Incomplete  Urine culture     Status: None   Collection Time: 11/10/15  6:42 PM  Result Value Ref Range Status   Specimen Description URINE, CLEAN CATCH  Final   Special Requests NONE  Final   Culture NO GROWTH 1 DAY  Final   Report Status 11/11/2015 FINAL  Final     Labs: Basic Metabolic Panel:  Recent Labs Lab 11/09/15 0740 11/09/15 1550 11/09/15 2246 11/10/15 0605 11/11/15 0636  NA 135 140 135 135 135  K 4.2 3.4* 3.9 3.9 3.5  CL 93* 98* 97* 101 97*  CO2 21* _0 GLUCOSE 297* 119* 251* 204* 190*  BUN _1 <5*  CREATININE 1.33* 1.03 1.04 0.90 1.00  CALCIUM 9.9 8.8* 8.5* 8.6* 8.7*   Liver Function Tests:  Recent Labs Lab 11/06/15 1224 11/09/15 0740  AST 14* 11*  ALT 15* 11*  ALKPHOS 85 90  BILITOT 1.1 1.7*  PROT 7.2 7.6  ALBUMIN 4.2 3.5    Recent Labs Lab 11/06/15 1224 11/09/15 0740 11/11/15 0636  LIPASE 47 30 30   No results for input(s): AMMONIA in the last 168 hours. CBC:  Recent Labs Lab 11/06/15 1224 11/09/15 0740 11/10/15 0605 11/11/15 0636  WBC 16.4* 7.0 6.4 6.6  NEUTROABS  --  4.4  --   --   HGB 14.9 13.6 11.9* 11.1*  HCT 42.3 39.9 34.9* 32.6*  MCV 80.0 81.6 80.6 80.5  PLT 432* 288 276 256   Cardiac Enzymes: No results for input(s): CKTOTAL, CKMB, CKMBINDEX, TROPONINI in the last 168 hours. BNP: BNP (last 3 results) No results for input(s): BNP in the last 8760 hours.  ProBNP (last 3 results) No results for input(s): PROBNP in the last 8760  hours.  CBG:  Recent Labs Lab 11/11/15 1105 11/11/15 1638 11/11/15 2149 11/12/15 0806 11/12/15 1153  GLUCAP 135* 140* 151* 145* 165*     Signed:  Velvet Bathe MD.  [facility] 11/12/2015, 12:52 PM

## 2015-11-12 NOTE — Progress Notes (Signed)
Delyle A Mare Ferrari to be D/C'd Home per MD order.  Discussed prescriptions and follow up appointments with the patient. Prescriptions given to patient, medication list explained in detail. Pt verbalized understanding.    Medication List    TAKE these medications        ACCU-CHEK AVIVA PLUS w/Device Kit  Check blood sugars 4 times daily (before meals and at bedtime)     accu-chek softclix lancets  Use as instructed     acetaminophen 325 MG tablet  Commonly known as:  TYLENOL  Take 2 tablets (650 mg total) by mouth every 6 (six) hours as needed for mild pain (or Fever >/= 101).     cephALEXin 500 MG capsule  Commonly known as:  KEFLEX  Take 1 capsule (500 mg total) by mouth 2 (two) times daily.     fenofibrate 145 MG tablet  Commonly known as:  TRICOR  Take 1 tablet (145 mg total) by mouth daily.     glucose blood test strip  Commonly known as:  ACCU-CHEK AVIVA  Use as instructed     insulin aspart 100 UNIT/ML injection  Commonly known as:  novoLOG  Inject 3-10 Units into the skin 3 (three) times daily. Blood Glucose 150 - 200, give 3 units Blood Glucose 201 - 250, give 5 units Blood Glucose 251 - 300, give 7 units Blood Glucose 301 - 350, give 10 units     Insulin Glargine 100 UNIT/ML Solostar Pen  Commonly known as:  LANTUS  Inject 45 Units into the skin daily at 10 pm.     lisinopril 10 MG tablet  Commonly known as:  PRINIVIL,ZESTRIL  Take 1 tablet (10 mg total) by mouth daily.     metFORMIN 500 MG tablet  Commonly known as:  GLUCOPHAGE  Take 2 tablets (1,000 mg total) by mouth 2 (two) times daily with a meal.     omeprazole 40 MG capsule  Commonly known as:  PRILOSEC  Take 1 capsule (40 mg total) by mouth daily.     oxyCODONE-acetaminophen 5-325 MG tablet  Commonly known as:  PERCOCET/ROXICET  Take 1 tablet by mouth every 6 (six) hours as needed for severe pain.     promethazine 25 MG tablet  Commonly known as:  PHENERGAN  Take 1 tablet (25 mg total) by mouth  every 8 (eight) hours as needed for nausea or vomiting.        Filed Vitals:   11/11/15 2129 11/12/15 0531  BP: 131/72 154/78  Pulse: 102 96  Temp: 99.5 F (37.5 C) 99.2 F (37.3 C)  Resp: 16 18    Skin clean, dry and intact without evidence of skin break down, no evidence of skin tears noted. IV catheter discontinued intact. Site without signs and symptoms of complications. Dressing and pressure applied. Pt denies pain at this time. No complaints noted.  An After Visit Summary was printed and given to the patient. Patient escorted via [place], and D/C home via private auto.  Carole Civil RN [facility] Phone [phone]

## 2015-11-13 ENCOUNTER — Inpatient Hospital Stay (HOSPITAL_COMMUNITY)
Admission: EM | Admit: 2015-11-13 | Discharge: 2015-11-19 | DRG: 074 | Disposition: A | Discharge status: Home / Self Care | Payer: No Typology Code available for payment source | Attending: Internal Medicine | Admitting: Internal Medicine

## 2015-11-13 ENCOUNTER — Encounter (HOSPITAL_COMMUNITY): Payer: Self-pay | Admitting: Family Medicine

## 2015-11-13 ENCOUNTER — Emergency Department (HOSPITAL_COMMUNITY)
Admission: EM | Admit: 2015-11-13 | Discharge: 2015-11-13 | Disposition: A | Discharge status: Home / Self Care | Payer: Self-pay | Attending: Emergency Medicine | Admitting: Emergency Medicine

## 2015-11-13 DIAGNOSIS — K5903 Drug induced constipation: Secondary | ICD-10-CM | POA: Diagnosis present

## 2015-11-13 DIAGNOSIS — J42 Unspecified chronic bronchitis: Secondary | ICD-10-CM | POA: Diagnosis present

## 2015-11-13 DIAGNOSIS — Z87891 Personal history of nicotine dependence: Secondary | ICD-10-CM

## 2015-11-13 DIAGNOSIS — K219 Gastro-esophageal reflux disease without esophagitis: Secondary | ICD-10-CM | POA: Diagnosis present

## 2015-11-13 DIAGNOSIS — R1084 Generalized abdominal pain: Secondary | ICD-10-CM

## 2015-11-13 DIAGNOSIS — Z794 Long term (current) use of insulin: Secondary | ICD-10-CM

## 2015-11-13 DIAGNOSIS — Z720 Tobacco use: Secondary | ICD-10-CM | POA: Diagnosis present

## 2015-11-13 DIAGNOSIS — Z6833 Body mass index (BMI) 33.0-33.9, adult: Secondary | ICD-10-CM

## 2015-11-13 DIAGNOSIS — E781 Pure hyperglyceridemia: Secondary | ICD-10-CM | POA: Diagnosis present

## 2015-11-13 DIAGNOSIS — IMO0001 Reserved for inherently not codable concepts without codable children: Secondary | ICD-10-CM | POA: Diagnosis present

## 2015-11-13 DIAGNOSIS — R1013 Epigastric pain: Secondary | ICD-10-CM

## 2015-11-13 DIAGNOSIS — Z833 Family history of diabetes mellitus: Secondary | ICD-10-CM

## 2015-11-13 DIAGNOSIS — N390 Urinary tract infection, site not specified: Secondary | ICD-10-CM

## 2015-11-13 DIAGNOSIS — K760 Fatty (change of) liver, not elsewhere classified: Secondary | ICD-10-CM | POA: Diagnosis present

## 2015-11-13 DIAGNOSIS — R109 Unspecified abdominal pain: Secondary | ICD-10-CM | POA: Diagnosis present

## 2015-11-13 DIAGNOSIS — E669 Obesity, unspecified: Secondary | ICD-10-CM | POA: Diagnosis present

## 2015-11-13 DIAGNOSIS — I10 Essential (primary) hypertension: Secondary | ICD-10-CM | POA: Diagnosis present

## 2015-11-13 DIAGNOSIS — T402X5A Adverse effect of other opioids, initial encounter: Secondary | ICD-10-CM | POA: Diagnosis present

## 2015-11-13 DIAGNOSIS — K863 Pseudocyst of pancreas: Secondary | ICD-10-CM | POA: Diagnosis present

## 2015-11-13 DIAGNOSIS — Z79899 Other long term (current) drug therapy: Secondary | ICD-10-CM

## 2015-11-13 DIAGNOSIS — E1143 Type 2 diabetes mellitus with diabetic autonomic (poly)neuropathy: Principal | ICD-10-CM | POA: Diagnosis present

## 2015-11-13 DIAGNOSIS — K3184 Gastroparesis: Secondary | ICD-10-CM | POA: Diagnosis present

## 2015-11-13 DIAGNOSIS — K861 Other chronic pancreatitis: Secondary | ICD-10-CM | POA: Diagnosis present

## 2015-11-13 DIAGNOSIS — E1165 Type 2 diabetes mellitus with hyperglycemia: Secondary | ICD-10-CM

## 2015-11-13 DIAGNOSIS — R112 Nausea with vomiting, unspecified: Secondary | ICD-10-CM

## 2015-11-13 HISTORY — DX: Obesity, unspecified: E66.9

## 2015-11-13 LAB — CBC
HEMATOCRIT: 37.9 % — AB (ref 39.0–52.0)
HEMOGLOBIN: 12.9 g/dL — AB (ref 13.0–17.0)
MCH: 26.9 pg (ref 26.0–34.0)
MCHC: 34 g/dL (ref 30.0–36.0)
MCV: 79 fL (ref 78.0–100.0)
Platelets: 392 10*3/uL (ref 150–400)
RBC: 4.8 MIL/uL (ref 4.22–5.81)
RDW: 13.2 % (ref 11.5–15.5)
WBC: 7.2 10*3/uL (ref 4.0–10.5)

## 2015-11-13 LAB — URINALYSIS, ROUTINE W REFLEX MICROSCOPIC
Glucose, UA: NEGATIVE mg/dL
Ketones, ur: 15 mg/dL — AB
LEUKOCYTES UA: NEGATIVE
NITRITE: NEGATIVE
PH: 5 (ref 5.0–8.0)
Protein, ur: 100 mg/dL — AB
SPECIFIC GRAVITY, URINE: 1.034 — AB (ref 1.005–1.030)

## 2015-11-13 LAB — URINE MICROSCOPIC-ADD ON

## 2015-11-13 LAB — LIPASE, BLOOD: Lipase: 32 U/L (ref 11–51)

## 2015-11-13 LAB — COMPREHENSIVE METABOLIC PANEL
ALBUMIN: 3.4 g/dL — AB (ref 3.5–5.0)
ALK PHOS: 112 U/L (ref 38–126)
ALT: 21 U/L (ref 17–63)
ANION GAP: 18 — AB (ref 5–15)
AST: 18 U/L (ref 15–41)
BILIRUBIN TOTAL: 1 mg/dL (ref 0.3–1.2)
BUN: 12 mg/dL (ref 6–20)
CALCIUM: 10.2 mg/dL (ref 8.9–10.3)
CO2: 25 mmol/L (ref 22–32)
Chloride: 95 mmol/L — ABNORMAL LOW (ref 101–111)
Creatinine, Ser: 1.17 mg/dL (ref 0.61–1.24)
GLUCOSE: 231 mg/dL — AB (ref 65–99)
POTASSIUM: 3.8 mmol/L (ref 3.5–5.1)
Sodium: 138 mmol/L (ref 135–145)
TOTAL PROTEIN: 8.1 g/dL (ref 6.5–8.1)

## 2015-11-13 LAB — CBG MONITORING, ED: GLUCOSE-CAPILLARY: 152 mg/dL — AB (ref 65–99)

## 2015-11-13 LAB — I-STAT CG4 LACTIC ACID, ED: LACTIC ACID, VENOUS: 1.12 mmol/L (ref 0.5–2.0)

## 2015-11-13 MED ORDER — INSULIN ASPART 100 UNIT/ML ~~LOC~~ SOLN
0.0000 [IU] | SUBCUTANEOUS | Status: DC
  Administered 2015-11-13 – 2015-11-15 (×8): 2 [IU] via SUBCUTANEOUS
  Administered 2015-11-15: 1 [IU] via SUBCUTANEOUS
  Administered 2015-11-15 – 2015-11-16 (×5): 2 [IU] via SUBCUTANEOUS
  Administered 2015-11-16 – 2015-11-17 (×3): 1 [IU] via SUBCUTANEOUS

## 2015-11-13 MED ORDER — SODIUM CHLORIDE 0.9 % IV SOLN
INTRAVENOUS | Status: DC
  Administered 2015-11-13 – 2015-11-15 (×5): via INTRAVENOUS
  Administered 2015-11-15: 1 mL via INTRAVENOUS
  Administered 2015-11-15 – 2015-11-16 (×2): via INTRAVENOUS
  Administered 2015-11-16 (×3): 1 mL via INTRAVENOUS
  Administered 2015-11-16: 16:00:00 via INTRAVENOUS
  Administered 2015-11-17 (×3): 1 mL via INTRAVENOUS
  Administered 2015-11-17: 11:00:00 via INTRAVENOUS
  Administered 2015-11-18: 1 mL via INTRAVENOUS
  Administered 2015-11-19: 08:00:00 via INTRAVENOUS

## 2015-11-13 MED ORDER — CEPHALEXIN 500 MG PO CAPS
500.0000 mg | ORAL_CAPSULE | Freq: Two times a day (BID) | ORAL | Status: DC
  Administered 2015-11-13 – 2015-11-14 (×2): 500 mg via ORAL
  Filled 2015-11-13 (×2): qty 1

## 2015-11-13 MED ORDER — INSULIN GLARGINE 100 UNIT/ML ~~LOC~~ SOLN
30.0000 [IU] | Freq: Every day | SUBCUTANEOUS | Status: DC
  Administered 2015-11-13: 30 [IU] via SUBCUTANEOUS
  Filled 2015-11-13 (×3): qty 0.3

## 2015-11-13 MED ORDER — SODIUM CHLORIDE 0.9 % IV BOLUS (SEPSIS)
1000.0000 mL | Freq: Once | INTRAVENOUS | Status: AC
  Administered 2015-11-13: 1000 mL via INTRAVENOUS

## 2015-11-13 MED ORDER — INSULIN GLARGINE 100 UNIT/ML SOLOSTAR PEN: 45.0000 [IU] | PEN_INJECTOR | Freq: Every day | SUBCUTANEOUS | Status: DC

## 2015-11-13 MED ORDER — PROMETHAZINE HCL 25 MG/ML IJ SOLN
12.5000 mg | Freq: Once | INTRAMUSCULAR | Status: AC
  Administered 2015-11-13: 12.5 mg via INTRAVENOUS
  Filled 2015-11-13 (×2): qty 1

## 2015-11-13 MED ORDER — INSULIN ASPART 100 UNIT/ML ~~LOC~~ SOLN: 3.0000 [IU] | Freq: Three times a day (TID) | SUBCUTANEOUS | Status: DC

## 2015-11-13 MED ORDER — MORPHINE SULFATE (PF) 2 MG/ML IV SOLN
2.0000 mg | INTRAVENOUS | Status: DC | PRN
  Administered 2015-11-14 (×2): 4 mg via INTRAVENOUS
  Filled 2015-11-13 (×2): qty 2

## 2015-11-13 MED ORDER — ACETAMINOPHEN 325 MG PO TABS: 650.0000 mg | ORAL_TABLET | Freq: Four times a day (QID) | ORAL | Status: DC | PRN

## 2015-11-13 MED ORDER — MORPHINE SULFATE (PF) 2 MG/ML IV SOLN
2.0000 mg | Freq: Once | INTRAVENOUS | Status: AC
  Administered 2015-11-13: 2 mg via INTRAVENOUS
  Filled 2015-11-13: qty 1

## 2015-11-13 MED ORDER — METFORMIN HCL 500 MG PO TABS: 1000.0000 mg | ORAL_TABLET | Freq: Two times a day (BID) | ORAL | Status: DC

## 2015-11-13 MED ORDER — ENOXAPARIN SODIUM 60 MG/0.6ML ~~LOC~~ SOLN
0.5000 mg/kg | SUBCUTANEOUS | Status: DC
  Administered 2015-11-13 – 2015-11-18 (×6): 50 mg via SUBCUTANEOUS
  Filled 2015-11-13 (×6): qty 0.6

## 2015-11-13 MED ORDER — FENOFIBRATE 160 MG PO TABS
160.0000 mg | ORAL_TABLET | Freq: Every day | ORAL | Status: DC
  Administered 2015-11-13 – 2015-11-15 (×3): 160 mg via ORAL
  Filled 2015-11-13 (×4): qty 1

## 2015-11-13 MED ORDER — LISINOPRIL 10 MG PO TABS
10.0000 mg | ORAL_TABLET | Freq: Every day | ORAL | Status: DC
  Administered 2015-11-13 – 2015-11-15 (×3): 10 mg via ORAL
  Filled 2015-11-13 (×3): qty 1

## 2015-11-13 MED ORDER — ONDANSETRON HCL 4 MG/2ML IJ SOLN
4.0000 mg | Freq: Four times a day (QID) | INTRAMUSCULAR | Status: DC | PRN
  Administered 2015-11-13 – 2015-11-18 (×7): 4 mg via INTRAVENOUS
  Filled 2015-11-13 (×8): qty 2

## 2015-11-13 MED ORDER — PROMETHAZINE HCL 25 MG PO TABS: 25.0000 mg | ORAL_TABLET | Freq: Three times a day (TID) | ORAL | Status: DC | PRN

## 2015-11-13 MED ORDER — MORPHINE SULFATE (PF) 2 MG/ML IV SOLN
2.0000 mg | INTRAVENOUS | Status: DC | PRN
  Administered 2015-11-13 (×2): 2 mg via INTRAVENOUS
  Filled 2015-11-13 (×2): qty 1

## 2015-11-13 MED ORDER — FENTANYL CITRATE (PF) 100 MCG/2ML IJ SOLN
50.0000 ug | Freq: Once | INTRAMUSCULAR | Status: AC
  Administered 2015-11-13: 50 ug via INTRAVENOUS
  Filled 2015-11-13: qty 2

## 2015-11-13 MED ORDER — PANTOPRAZOLE SODIUM 40 MG PO TBEC
80.0000 mg | DELAYED_RELEASE_TABLET | Freq: Every day | ORAL | Status: DC
  Administered 2015-11-13 – 2015-11-19 (×7): 80 mg via ORAL
  Filled 2015-11-13 (×7): qty 2

## 2015-11-13 MED ORDER — SODIUM CHLORIDE 0.9 % IV SOLN: Freq: Once | INTRAVENOUS | Status: DC

## 2015-11-13 NOTE — ED Provider Notes (Signed)
CSN: [id number]     Arrival date & time 11/13/15  1327 History   First MD Initiated Contact with Patient 11/13/15 1711     Chief Complaint  Patient presents with  . Abdominal Pain  . Nausea   History provided by patient.  (Consider location/radiation/quality/duration/timing/severity/associated sxs/prior Treatment) HPI   Patient presents to ED today with persistent abdominal pain, nausea, vomiting, within 24 hours of recent [facility] on [facility] (treated for UTI empirically with keflex has only taken 1 day of this, additionally had pancreatic pseudocyst on CT 2/5), he states he was not feeling much better when discharged, and was advised to come back if not improving. Over past 24 hours since discharge, he reports persistent vomiting about every 1-3 hours (clear, yellow) every time he has tried to eat or drink in past 24 hours he vomits it back up within minutes, additionally with abdominal pain described as "aching pain, like someone holding their foot down on abdomen" with some relief radiating to right lower back, constant without any relief causing difficulty sleeping last night. Now worsening symptoms with a new chest pain started in left shoulder radiating down into left chest described as sharp pains with intermittent episodes lasting about < 1 hour associated with SOB and denies this chest pain or SOB actively in ED. - Admits occasional chills, active nausea in ED (has full bedside emesis bag, mostly clear yellow) - Admits reduced voiding, last urination about 1.5 to 2 days ago in hospital (no void in past 24 hours) - Admits some dysuria (intermittent) - Denies any fevers, diarrhea, constipation, hematemesis, hematuria, frequency  Additional history with sexually active, denies known STD history, denies penile discharge  Past Medical History  Diagnosis Date  . Hypertension   . Chronic bronchitis ([place])     "get it about q year" (11/20/2013)  . DM type 1 (diabetes  mellitus, type 1) ([place]) 11/2013    , went straight to Insulin.   Marland Kitchen Hypertriglyceridemia   . Microalbuminuria   . Molluscum contagiosum 06/2015    upper extremities.   . Hepatic steatosis 11/2013    seen on CT, LFTs normal. Rare if any ETOH  . Pancreatitis    Past Surgical History  Procedure Laterality Date  . Tonsillectomy and adenoidectomy  ~ 1999  . Esophagogastroduodenoscopy N/A 11/21/2013    Procedure: ESOPHAGOGASTRODUODENOSCOPY (EGD);  Surgeon: Wonda Horner, MD;  Location: [facility] ENDOSCOPY;  Service: Endoscopy;  Laterality: N/A;   Family History  Problem Relation Age of Onset  . Diabetes Mellitus II Father   . Heart failure Father   . Asthma Mother   . Diabetes Mellitus I Sister    Social History  Substance Use Topics  . Smoking status: Former Smoker -- 0.00 packs/day for 3 years    Types: Cigarettes    Quit date: 12/11/2013  . Smokeless tobacco: Never Used  . Alcohol Use: Yes     Comment: drinks beer-last drink was New Year's Day    Review of Systems  See above HPI  Allergies  Review of patient's allergies indicates no known allergies.  Home Medications   Prior to Admission medications   Medication Sig Start Date End Date Taking? Authorizing Provider  acetaminophen (TYLENOL) 325 MG tablet Take 2 tablets (650 mg total) by mouth every 6 (six) hours as needed for mild pain (or Fever >/= 101). 11/12/15  Yes Velvet Bathe, MD  cephALEXin (KEFLEX) 500 MG capsule Take 1 capsule (500 mg total) by mouth 2 (two)  times daily. 11/12/15  Yes Velvet Bathe, MD  fenofibrate (TRICOR) 145 MG tablet Take 1 tablet (145 mg total) by mouth daily. 05/22/15  Yes Arnoldo Morale, MD  insulin aspart (NOVOLOG) 100 UNIT/ML injection Inject 3-10 Units into the skin 3 (three) times daily. Blood Glucose 150 - 200, give 3 units Blood Glucose 201 - 250, give 5 units Blood Glucose 251 - 300, give 7 units Blood Glucose 301 - 350, give 10 units 10/31/15  Yes Tresa Garter, MD  Insulin Glargine (LANTUS) 100  UNIT/ML Solostar Pen Inject 45 Units into the skin daily at 10 pm. 10/31/15  Yes Tresa Garter, MD  lisinopril (PRINIVIL,ZESTRIL) 10 MG tablet Take 1 tablet (10 mg total) by mouth daily. 06/25/15  Yes Arnoldo Morale, MD  metFORMIN (GLUCOPHAGE) 500 MG tablet Take 2 tablets (1,000 mg total) by mouth 2 (two) times daily with a meal. 06/25/15  Yes Arnoldo Morale, MD  omeprazole (PRILOSEC) 40 MG capsule Take 1 capsule (40 mg total) by mouth daily. 10/31/15  Yes Tresa Garter, MD  promethazine (PHENERGAN) 25 MG tablet Take 1 tablet (25 mg total) by mouth every 8 (eight) hours as needed for nausea or vomiting. 11/12/15  Yes Velvet Bathe, MD  Blood Glucose Monitoring Suppl (ACCU-CHEK AVIVA PLUS) W/DEVICE KIT Check blood sugars 4 times daily (before meals and at bedtime) 12/20/14   Tresa Garter, MD  glucose blood (ACCU-CHEK AVIVA) test strip Use as instructed 12/20/14   Tresa Garter, MD  Lancet Devices (ACCU-CHEK [facility]) lancets Use as instructed 12/20/14   Tresa Garter, MD  oxyCODONE-acetaminophen (PERCOCET/ROXICET) 5-325 MG tablet Take 1 tablet by mouth every 6 (six) hours as needed for severe pain. Patient not taking: Reported on 11/13/2015 11/06/15   Dalia Heading, PA-C   BP 132/82 mmHg  Pulse 98  Temp(Src) 98.7 F (37.1 C) (Oral)  Resp 20  SpO2 98% Physical Exam  Constitutional: He is oriented to person, place, and time. He appears well-developed and well-nourished. He appears distressed.  Obese, 65 yr AAM, uncomfortable with nausea and abdominal pain  HENT:  Head: Normocephalic and atraumatic.  Mouth/Throat: Oropharynx is clear and moist. No oropharyngeal exudate.  Dry mucus mem and tongue  Eyes: Conjunctivae and EOM are normal. Pupils are equal, round, and reactive to light.  Neck: Normal range of motion. Neck supple. No thyromegaly present.  Cardiovascular: Regular rhythm, normal heart sounds and intact distal pulses.   No murmur heard. Tachycardia  Pulmonary/Chest:  Effort normal and breath sounds normal. No respiratory distress. He has no wheezes. He has no rales. He exhibits no tenderness (not reproducible chest pain).  Abdominal: Soft. Bowel sounds are normal. He exhibits no distension and no mass. There is tenderness (midline below umbilicus and bilateral lower quads). There is guarding. There is no rebound.  Musculoskeletal: Normal range of motion. He exhibits tenderness. He exhibits no edema.  Right lower back pain to palpation  Neurological: He is alert and oriented to person, place, and time.  Skin: Skin is warm and dry. No rash noted. He is not diaphoretic.  Psychiatric: He has a normal mood and affect. His behavior is normal.  Nursing note and vitals reviewed.   ED Course  Procedures (including critical care time) Labs Review Labs Reviewed  COMPREHENSIVE METABOLIC PANEL - Abnormal; Notable for the following:    Chloride 95 (*)    Glucose, Bld 231 (*)    Albumin 3.4 (*)    Anion gap 18 (*)    All  other components within normal limits  CBC - Abnormal; Notable for the following:    Hemoglobin 12.9 (*)    HCT 37.9 (*)    All other components within normal limits  URINE CULTURE  LIPASE, BLOOD  URINALYSIS, ROUTINE W REFLEX MICROSCOPIC (NOT AT [facility])  I-STAT CG4 LACTIC ACID, ED    Imaging Review No results found. I have personally reviewed and evaluated these images and lab results as part of my medical decision-making.   EKG Interpretation None      MDM   Final diagnoses:  Generalized abdominal pain  Nausea and vomiting, vomiting of unspecified type  UTI (lower urinary tract infection)  Type 2 diabetes mellitus with hyperglycemia, with long-term current use of insulin ([place])   24 yr AAM with PMH obesity, DM2 (IDDM), GERD presents with persistent generalized abdominal pain and active nausea / vomiting persistent >24 hours since discharge from recent hospitalization at [facility] 2/4 to 11/12/15 for same complaints, thought to have UTI with  possible pyelo with UTI symptoms and febrile at that time, treated with antibiotics, discharged home on Keflex still has 6 days left), no elevated lipase but treated for pancreatitis in 10/2015 with another hospitalization. Currently in ED tachycardic to 110-120s, BP stable to hypertensive, afebrile today, active vomiting in ED.  Suspect current symptoms may be sequela of potentially worsening pancreatic pseudocyst (last seen on CT abdomen imaging 2/5) following prior pancreatitis in 10/2015, initially suspected some component of DM gastroparesis with persistent n/v over 1-2 months now. Last urine culture with No Growth, some symptoms of pyelo (R-back pain, dysuria) now with resolved fevers, possible still cause of abdomen pain / n/v will re-check UA / Urine culture. Additionally, elevated CBGs 200s, Anion Gap remains elevated to 18 (last checked yesterday prior to discharge at 19), normal mental status and no neuro deficits.  UPDATE 1930 - Re-evaluated patient, still uncomfortable, awaiting pain medicines, receiving IV fluids. Checked lactic acid normal 1.12, anticipate re-check per protocol. Called consult to [facility] team to admit patient, given persistent symptoms s/p discharge yesterday 2/7. Discussed with [facility], they will see patient in ED, and admit to inpatient, med-surg, temp orders placed, they will anticipate contacting GI to discuss pseudocyst drainage.    Olin Hauser, DO 11/13/15 [place], DO 11/13/15 1945  Leonard Schwartz, MD 11/13/15 2015

## 2015-11-13 NOTE — ED Notes (Signed)
Attempted to obtain urine sample. Pt stated he is unable to provide sample at this time. Will try again later

## 2015-11-13 NOTE — ED Notes (Addendum)
Pt states he was just d/c from [facility] yesterday and was told to come back if his sx didn't improve. Pt states he hasn't had any relief.

## 2015-11-13 NOTE — H&P (Signed)
[facility] History and Physical  Brent Costa UJW:[id number] DOB: Jul 19, 1991 DOA: 11/13/2015  Referring physician: EDP PCP: Angelica Chessman, MD   Chief Complaint: Abd pain, emesis   HPI: Brent Costa is a 25 y.o. male with h/o MODY, HTN.  Patient had acute pancreatitis in January with new onset of severe N/V and abd pain at that time.  This essentially persisted and he was re-admitted on 2/4 with same symptoms.  Imaging of his abd in mid Jan showed acute pancreatitis, Imaging of his abd 15 days later on Feb 1st showed interval development of a pancreatic pseudocyst.  Patient was discharged on 2/7 with keflex for UTI.  Since discharge he has had epigastric abdominal pain and intractable N/V.  Review of Systems: Systems reviewed.  As above, otherwise negative  Past Medical History  Diagnosis Date  . Hypertension   . Chronic bronchitis ([place])     "get it about q year" (11/20/2013)  . DM type 1 (diabetes mellitus, type 1) ([place]) 11/2013    , went straight to Insulin.   Marland Kitchen Hypertriglyceridemia   . Microalbuminuria   . Molluscum contagiosum 06/2015    upper extremities.   . Hepatic steatosis 11/2013    seen on CT, LFTs normal. Rare if any ETOH  . Pancreatitis    Past Surgical History  Procedure Laterality Date  . Tonsillectomy and adenoidectomy  ~ 1999  . Esophagogastroduodenoscopy N/A 11/21/2013    Procedure: ESOPHAGOGASTRODUODENOSCOPY (EGD);  Surgeon: Wonda Horner, MD;  Location: [facility] ENDOSCOPY;  Service: Endoscopy;  Laterality: N/A;   Social History:  reports that he quit smoking about 23 months ago. His smoking use included Cigarettes. He smoked 0.00 packs per day for 3 years. He has never used smokeless tobacco. He reports that he drinks alcohol. He reports that he uses illicit drugs (Marijuana) about twice per week.  No Known Allergies  Family History  Problem Relation Age of Onset  . Diabetes Mellitus II Father   . Heart failure Father   . Asthma Mother   .  Diabetes Mellitus I Sister      Prior to Admission medications   Medication Sig Start Date End Date Taking? Authorizing Provider  acetaminophen (TYLENOL) 325 MG tablet Take 2 tablets (650 mg total) by mouth every 6 (six) hours as needed for mild pain (or Fever >/= 101). 11/12/15  Yes Velvet Bathe, MD  cephALEXin (KEFLEX) 500 MG capsule Take 1 capsule (500 mg total) by mouth 2 (two) times daily. 11/12/15  Yes Velvet Bathe, MD  fenofibrate (TRICOR) 145 MG tablet Take 1 tablet (145 mg total) by mouth daily. 05/22/15  Yes Arnoldo Morale, MD  insulin aspart (NOVOLOG) 100 UNIT/ML injection Inject 3-10 Units into the skin 3 (three) times daily. Blood Glucose 150 - 200, give 3 units Blood Glucose 201 - 250, give 5 units Blood Glucose 251 - 300, give 7 units Blood Glucose 301 - 350, give 10 units 10/31/15  Yes Tresa Garter, MD  Insulin Glargine (LANTUS) 100 UNIT/ML Solostar Pen Inject 45 Units into the skin daily at 10 pm. 10/31/15  Yes Tresa Garter, MD  lisinopril (PRINIVIL,ZESTRIL) 10 MG tablet Take 1 tablet (10 mg total) by mouth daily. 06/25/15  Yes Arnoldo Morale, MD  metFORMIN (GLUCOPHAGE) 500 MG tablet Take 2 tablets (1,000 mg total) by mouth 2 (two) times daily with a meal. 06/25/15  Yes Arnoldo Morale, MD  omeprazole (PRILOSEC) 40 MG capsule Take 1 capsule (40 mg total) by mouth daily. 10/31/15  Yes Tresa Garter, MD  promethazine (PHENERGAN) 25 MG tablet Take 1 tablet (25 mg total) by mouth every 8 (eight) hours as needed for nausea or vomiting. 11/12/15  Yes Velvet Bathe, MD  Blood Glucose Monitoring Suppl (ACCU-CHEK AVIVA PLUS) W/DEVICE KIT Check blood sugars 4 times daily (before meals and at bedtime) 12/20/14   Tresa Garter, MD  glucose blood (ACCU-CHEK AVIVA) test strip Use as instructed 12/20/14   Tresa Garter, MD  Lancet Devices (ACCU-CHEK [facility]) lancets Use as instructed 12/20/14   Tresa Garter, MD  oxyCODONE-acetaminophen (PERCOCET/ROXICET) 5-325 MG tablet Take 1  tablet by mouth every 6 (six) hours as needed for severe pain. Patient not taking: Reported on 11/13/2015 11/06/15   Dalia Heading, PA-C   Physical Exam: Filed Vitals:   11/13/15 1858 11/13/15 1921  BP: 137/76 132/82  Pulse: 115 98  Temp:    Resp: 18 20    BP 132/82 mmHg  Pulse 98  Temp(Src) 98.7 F (37.1 C) (Oral)  Resp 20  SpO2 98%  General Appearance:    Alert, oriented, no distress, appears stated age  Head:    Normocephalic, atraumatic  Eyes:    PERRL, EOMI, sclera non-icteric        Nose:   Nares without drainage or epistaxis. Mucosa, turbinates normal  Throat:   Moist mucous membranes. Oropharynx without erythema or exudate.  Neck:   Supple. No carotid bruits.  No thyromegaly.  No lymphadenopathy.   Back:     No CVA tenderness, no spinal tenderness  Lungs:     Clear to auscultation bilaterally, without wheezes, rhonchi or rales  Chest wall:    No tenderness to palpitation  Heart:    Regular rate and rhythm without murmurs, gallops, rubs  Abdomen:     Soft, Epigastric tenderness, nondistended, normal bowel sounds, no organomegaly  Genitalia:    deferred  Rectal:    deferred  Extremities:   No clubbing, cyanosis or edema.  Pulses:   2+ and symmetric all extremities  Skin:   Skin color, texture, turgor normal, no rashes or lesions  Lymph nodes:   Cervical, supraclavicular, and axillary nodes normal  Neurologic:   CNII-XII intact. Normal strength, sensation and reflexes      throughout    Labs on Admission:  Basic Metabolic Panel:  Recent Labs Lab 11/09/15 2246 11/10/15 0605 11/11/15 0636 11/12/15 2158 11/13/15 1400  NA 135 135 135 137 138  K 3.9 3.9 3.5 3.5 3.8  CL 97* 101 97* 96* 95*  CO2 _0 GLUCOSE 251* 204* 190* 214* 231*  BUN 11 8 <5* 9 12  CREATININE 1.04 0.90 1.00 1.21 1.17  CALCIUM 8.5* 8.6* 8.7* 9.7 10.2   Liver Function Tests:  Recent Labs Lab 11/09/15 0740 11/12/15 2158 11/13/15 1400  AST 11* 17 18  ALT 11* 22 21   ALKPHOS 90 115 112  BILITOT 1.7* 1.1 1.0  PROT 7.6 8.1 8.1  ALBUMIN 3.5 3.3* 3.4*    Recent Labs Lab 11/09/15 0740 11/11/15 0636 11/12/15 2158 11/13/15 1400  LIPASE _1 32   No results for input(s): AMMONIA in the last 168 hours. CBC:  Recent Labs Lab 11/09/15 0740 11/10/15 0605 11/11/15 0636 11/12/15 2158 11/13/15 1400  WBC 7.0 6.4 6.6 7.4 7.2  NEUTROABS 4.4  --   --   --   --   HGB 13.6 11.9* 11.1* 12.6* 12.9*  HCT 39.9 34.9* 32.6* 37.2* 37.9*  MCV  81.6 80.6 80.5 79.1 79.0  PLT 288 276 256 349 392   Cardiac Enzymes: No results for input(s): CKTOTAL, CKMB, CKMBINDEX, TROPONINI in the last 168 hours.  BNP (last 3 results) No results for input(s): PROBNP in the last 8760 hours. CBG:  Recent Labs Lab 11/11/15 1105 11/11/15 1638 11/11/15 2149 11/12/15 0806 11/12/15 1153  GLUCAP 135* 140* 151* 145* 165*    Radiological Exams on Admission: No results found.  EKG: Independently reviewed.  Assessment/Plan Principal Problem:   Chronic pancreatitis ([place]) Active Problems:   Hypertension   Diabetes mellitus without complication (HCC)   Abdominal pain   Pancreatic pseudocyst   1. Chronic pancreatitis with pancreatic pseudocyst as most likely cause of intractable nausea / vomiting and abdominal pain - 1. DDX includes gastroparesis 1. while gastroparesis is possible, this does seem less likely as patient was just diagnosed with DM in 2015, has never had any trouble with gastroparesis or GI problems at all he reports prior to January this year (when he had acute pancreatitis with no pseudocyst on imaging 1/16, and development of pseudocyst on imaging 2/1). 2. Given that there remains some question about the above (see notes from last admit), feel that GI consultation and opinion is probably most reasonable next step here 2. IVF 3. If GI agrees that pseudocyst likely to be causing problems, would push for drainage at this point (patient clearly has now  failed expectant management), EGD drainage vs IR drainage TBD by GI recs 4. NPO 5. Pain control with morphine 6. Zofran PRN IV 2. DM - MODY 1. Reduce lantus to 30 units 2. SSI Q4H while NPO 3. Holding metformin 3. HTN - continue ACEi    Code Status: Full Code  Family Communication: Family at bedside Disposition Plan: Admit to inpatient   Time spent: 71 min  Kiele Heavrin M. [facility] Pager [phone]  If 7AM-7PM, please contact the day team taking care of the patient [email or web address] Password [facility] 11/13/2015, 7:49 PM

## 2015-11-13 NOTE — ED Notes (Signed)
Pt here with N,V since yesterday. sts RUQ pain.

## 2015-11-13 NOTE — ED Notes (Signed)
Called for room with no answer at 0149

## 2015-11-13 NOTE — ED Notes (Signed)
Called to recheck vitals no answer 

## 2015-11-14 ENCOUNTER — Encounter (HOSPITAL_COMMUNITY): Payer: Self-pay | Admitting: Physician Assistant

## 2015-11-14 DIAGNOSIS — K859 Acute pancreatitis without necrosis or infection, unspecified: Secondary | ICD-10-CM

## 2015-11-14 DIAGNOSIS — E119 Type 2 diabetes mellitus without complications: Secondary | ICD-10-CM

## 2015-11-14 DIAGNOSIS — K863 Pseudocyst of pancreas: Secondary | ICD-10-CM

## 2015-11-14 DIAGNOSIS — K861 Other chronic pancreatitis: Secondary | ICD-10-CM

## 2015-11-14 DIAGNOSIS — E781 Pure hyperglyceridemia: Secondary | ICD-10-CM

## 2015-11-14 DIAGNOSIS — Z720 Tobacco use: Secondary | ICD-10-CM

## 2015-11-14 LAB — GLUCOSE, CAPILLARY
GLUCOSE-CAPILLARY: 165 mg/dL — AB (ref 65–99)
GLUCOSE-CAPILLARY: 166 mg/dL — AB (ref 65–99)
Glucose-Capillary: 151 mg/dL — ABNORMAL HIGH (ref 65–99)
Glucose-Capillary: 166 mg/dL — ABNORMAL HIGH (ref 65–99)
Glucose-Capillary: 169 mg/dL — ABNORMAL HIGH (ref 65–99)
Glucose-Capillary: 169 mg/dL — ABNORMAL HIGH (ref 65–99)
Glucose-Capillary: 181 mg/dL — ABNORMAL HIGH (ref 65–99)

## 2015-11-14 MED ORDER — MORPHINE SULFATE (PF) 2 MG/ML IV SOLN
2.0000 mg | INTRAVENOUS | Status: DC | PRN
  Administered 2015-11-14: 4 mg via INTRAVENOUS
  Filled 2015-11-14: qty 2

## 2015-11-14 MED ORDER — BOOST / RESOURCE BREEZE PO LIQD
1.0000 | Freq: Three times a day (TID) | ORAL | Status: DC
  Administered 2015-11-14 – 2015-11-19 (×16): 1 via ORAL

## 2015-11-14 MED ORDER — HYDROMORPHONE HCL 1 MG/ML IJ SOLN
0.5000 mg | INTRAMUSCULAR | Status: DC | PRN
  Administered 2015-11-14 – 2015-11-18 (×27): 0.5 mg via INTRAVENOUS
  Filled 2015-11-14 (×27): qty 1

## 2015-11-14 MED ORDER — DIPHENHYDRAMINE HCL 25 MG PO CAPS
25.0000 mg | ORAL_CAPSULE | Freq: Four times a day (QID) | ORAL | Status: DC | PRN
  Administered 2015-11-15 – 2015-11-19 (×6): 25 mg via ORAL
  Filled 2015-11-14 (×6): qty 1

## 2015-11-14 MED ORDER — ENSURE ENLIVE PO LIQD: 237.0000 mL | Freq: Two times a day (BID) | ORAL | Status: DC

## 2015-11-14 MED ORDER — INSULIN GLARGINE 100 UNIT/ML ~~LOC~~ SOLN
34.0000 [IU] | Freq: Every day | SUBCUTANEOUS | Status: DC
  Administered 2015-11-14 – 2015-11-18 (×5): 34 [IU] via SUBCUTANEOUS
  Filled 2015-11-14 (×9): qty 0.34

## 2015-11-14 NOTE — Progress Notes (Signed)
PROGRESS NOTE  Brent Costa:[id number] DOB: 22-Aug-1991 DOA: 11/13/2015 PCP: Jeanann Lewandowsky, MD  HPI/Recap of past 24 hours: 25 yr old male with past medical history of poorly controlled diabetes mellitus who was first hospitalized for pancreatitis in January (was felt to be secondary to hypertriglyceridemia and possibly some alcohol contribution) & subsequent admissions (x2) for recurrent pancreatitis.  During most recent hospitalization a few days ago, CT scan noted new finding of a pancreatic pseudocyst.   Today patient complaining of significant pain. Stated morphine does not help. Also makes him itch. GI consulted and consult pending. [place]   Assessment/Plan: Principal Problem: Acute on chronic pancreatitis with secondary pancreatic pseudocyst: Based on lipid panel, patient has been on TriCor and showing improvement triglycerides, although TriCor has been listed itself cause pancreatitis. Have consulted GI for opinion on whether pseudocyst drain would improve patient's symptoms. Active Problems:   Hypertension   Diabetes mellitus without complication [facility]): Very poorly controlled. A1c last month 12. Increased Lantus, patient currently nothing by mouth    Abdominal pain    Tobacco use: Counseled  Obesity: Patient is criteria with BMI greater than 35  Code Status: Full Code    Family Communication: Mom & fiance at the bedside   Disposition Plan: Likely here for several days    Consultants:  [facility]   Procedures:  None   Antibiotics:  None    Objective: BP 125/67 mmHg  Pulse 100  Temp(Src) 98.8 F (37.1 C) (Oral)  Resp 18  Ht  (1.753 m)  Wt 102 kg (224 lb 13.9 oz)  BMI 33.19 kg/m2  SpO2 98%  Intake/Output Summary (Last 24 hours) at 11/14/15 1536 Last data filed at 11/14/15 1200  Gross per 24 hour  Intake    625 ml  Output    400 ml  Net    225 ml   Filed Weights   11/13/15 2041  Weight: 102 kg (224 lb 13.9 oz)     Exam:   General: allergic minute 3 this    Cardiovascular: RRR S1S2   Respiratory: CTA Bilaterally  Abdomen: Soft, tender in the midepigastric area, hypoactive bowel sounds   Musculoskeletal: no edema    Data Reviewed: Basic Metabolic Panel:  Recent Labs Lab 11/09/15 2246 11/10/15 0605 11/11/15 0636 11/12/15 2158 11/13/15 1400  NA 135 135 135 137 138  K 3.9 3.9 3.5 3.5 3.8  CL 97* 101 97* 96* 95*  CO2 GLUCOSE 251* 204* 190* 214* 231*  BUN 11 8 <5* 9 12  CREATININE 1.04 0.90 1.00 1.21 1.17  CALCIUM 8.5* 8.6* 8.7* 9.7 10.2   Liver Function Tests:  Recent Labs Lab 11/09/15 0740 11/12/15 2158 11/13/15 1400  AST 11* 17 18  ALT 11* 22 21  ALKPHOS 90 115 112  BILITOT 1.7* 1.1 1.0  PROT 7.6 8.1 8.1  ALBUMIN 3.5 3.3* 3.4*    Recent Labs Lab 11/09/15 0740 11/11/15 0636 11/12/15 2158 11/13/15 1400  LIPASE 32   No results for input(s): AMMONIA in the last 168 hours. CBC:  Recent Labs Lab 11/09/15 0740 11/10/15 0605 11/11/15 0636 11/12/15 2158 11/13/15 1400  WBC 7.0 6.4 6.6 7.4 7.2  NEUTROABS 4.4  --   --   --   --   HGB 13.6 11.9* 11.1* 12.6* 12.9*  HCT 39.9 34.9* 32.6* 37.2* 37.9*  MCV 81.6 80.6 80.5 79.1 79.0  PLT 288 276 256 349 392   Cardiac Enzymes:  No results for input(s): CKTOTAL, CKMB, CKMBINDEX, TROPONINI in the last 168 hours. BNP (last 3 results) No results for input(s): BNP in the last 8760 hours.  ProBNP (last 3 results) No results for input(s): PROBNP in the last 8760 hours.  CBG:  Recent Labs Lab 11/13/15 2023 11/14/15 0002 11/14/15 0456 11/14/15 0716 11/14/15 1137  GLUCAP 152* 165* 166* 181* 169*    Recent Results (from the past 240 hour(s))  Culture, blood (Routine X 2) w Reflex to ID Panel     Status: None (Preliminary result)   Collection Time: 11/10/15  6:05 AM  Result Value Ref Range Status   Specimen Description BLOOD RIGHT ANTECUBITAL  Final   Special Requests BOTTLES DRAWN  AEROBIC ONLY 5CCS  Final   Culture NO GROWTH 3 DAYS  Final   Report Status PENDING  Incomplete  Culture, blood (Routine X 2) w Reflex to ID Panel     Status: None (Preliminary result)   Collection Time: 11/10/15  6:16 AM  Result Value Ref Range Status   Specimen Description BLOOD RIGHT HAND  Final   Special Requests AEROCOCCUS SPECIES 6CCS  Final   Culture NO GROWTH 3 DAYS  Final   Report Status PENDING  Incomplete  Urine culture     Status: None   Collection Time: 11/10/15  6:42 PM  Result Value Ref Range Status   Specimen Description URINE, CLEAN CATCH  Final   Special Requests NONE  Final   Culture NO GROWTH 1 DAY  Final   Report Status 11/11/2015 FINAL  Final     Studies: No results found.  Scheduled Meds: . sodium chloride   Intravenous Once  . enoxaparin (LOVENOX) injection  0.5 mg/kg Subcutaneous Q24H  . feeding supplement  1 Container Oral TID BM  . feeding supplement (ENSURE ENLIVE)  237 mL Oral BID BM  . fenofibrate  160 mg Oral Daily  . insulin aspart  0-9 Units Subcutaneous 6 times per day  . insulin glargine  34 Units Subcutaneous Q2200  . lisinopril  10 mg Oral Daily  . pantoprazole  80 mg Oral Daily    Continuous Infusions: . sodium chloride 225 mL/hr at 11/14/15 1502     Time spent: 25 minutes   Hollice Espy  [facility] Pager [phone] . If 7PM-7AM, please contact night-coverage at [email or web address], password [facility] 11/14/2015, 3:36 PM  LOS: 1 day

## 2015-11-14 NOTE — Progress Notes (Signed)
Initial Nutrition Assessment  DOCUMENTATION CODES:   Obesity unspecified  INTERVENTION:   -Boost Breeze po TID, each supplement provides 250 kcal and 9 grams of protein  NUTRITION DIAGNOSIS:   Inadequate oral intake related to altered GI function as evidenced by per patient/family report.  GOAL:   Patient will meet greater than or equal to 90% of their needs  MONITOR:   PO intake, Supplement acceptance, Diet advancement, Labs, Weight trends, Skin, I & O's  REASON FOR ASSESSMENT:   Malnutrition Screening Tool    ASSESSMENT:   Brent Costa is a 25 y.o. male with h/o MODY, HTN. Patient had acute pancreatitis in January with new onset of severe N/V and abd pain at that time. This essentially persisted and he was re-admitted on 2/4 with same symptoms. Imaging of his abd in mid Jan showed acute pancreatitis, Imaging of his abd 15 days later on Feb 1st showed interval development of a pancreatic pseudocyst. Patient was discharged on 2/7 with keflex for UTI. Since discharge he has had epigastric abdominal pain and intractable N/V.  Pt admitted with chronic pancreatitis with pancreatic pseudocyst.   Pt was seen by RD during previous admission on 11/11/15 (noted pt was just d/c on 11/13/15). Unable to interview pt or complete exam at time of visit, however, exam was completed on 11/11/15 and this RD does not suspect any changes to exam at this time. Previous exam revealed no signs of fat or muscle depletion.   Wt hx reviewed. Noted UBW approximately 270#. Noted a 32# (11.9%) wt loss x 1 month, however, suspect discrepancies in weight, as there is a 14# wt change between 11/12/15 and 11/13/15.  Spoke with RN, who reports pt was just advanced to a clear liquid diet and is tolerating diet well thus far. Pt just received a Boost Breeze supplement and RN reports pt is taking supplement well. RD to continue order.   GI consulted for potential pseudocyst drain placement.   Labs reviewed: CBGS:  165-181. Last Hgb A1c was 12.4 on 10/17/15, however, DM is historically uncontrolled raging between 7.4-12.4. Suspect weight changes may be in part related to poor glycemic control.   Diet Order:  Diet clear liquid Room service appropriate?: Yes; Fluid consistency:: Thin  Skin:  Reviewed, no issues  Last BM:  11/11/15  Height:   Ht Readings from Last 1 Encounters:  11/13/15  (1.753 m)    Weight:   Wt Readings from Last 1 Encounters:  11/13/15 224 lb 13.9 oz (102 kg)    Ideal Body Weight:  72.7 kg  BMI:  Body mass index is 33.19 kg/(m^2).  Estimated Nutritional Needs:   Kcal:  1800-2000  Protein:  95-105 grams  Fluid:  1.8-2.0 L  EDUCATION NEEDS:   No education needs identified at this time  Sam Wunschel A. Mayford Knife, RD, LDN, CDE Pager: [phone] After hours Pager: [phone]

## 2015-11-14 NOTE — Consult Note (Addendum)
[facility] Consult: 12:07 PM 11/14/2015  LOS: 1 day    Referring Provider: Dr. Gevena Barre  Primary Care Physician:  Angelica Chessman, MD Primary Gastroenterologist: Althia Forts.  11/2013 Dr. Penelope Coop saw as inpatient patient, 10/2015 seen as inpatient by Dr. Luz Brazen al.    Reason for Consultation:  Abdominal pain   HPI: Brent Costa is a 25 y.o. male. 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 Microalbuminuria.Molluscum contagiosum. Hypertriglyceridemia (966 in 12/2014). Fatty liver on 11/2013 CT. Molluscum contagiosum.   11/21/2013 EGD for epigastric pain and CT with inflammatory appearance in region of duodenum. Dr Penelope Coop. Normal study. LFTs then normal, Lipase not assayed. Triglycerides in March 2016 were 966.  Admission 1/11- 10/26/15 with acute pancreatitis, intractable nausea and vomiting. Lipase maximum of 595 on admission, quickly normalized and 21 on the day of discharge. Patient admitted to binge drinking, this along with hypertriglyceridemia ( apex level 3108) was felt to be the cause of the pancreatitis.  He was treated with NG tube decompression as the pancreatic head inflammation caused duodenal inflammation and poor gastric emptying. Two CT scans performed.  CT scan #1 10/21/15 showed inflammatory changes in the retroperitoneum adjacent to the duodenum and pancreatic head, location of the inflammation inflammation consistent with that seen on CT 11/2013 but overall more severe. Hepatic steatosis persistent compared with previous CT. CT scan #2, 10/21/2015 showing worsening pancreatic inflammation with increased peripancreatic fluid.  No pancreatic necrosis, abscess or pseudocyst. No venous thrombosis.  Patient seen in the emergency room and underwent CT  scan 11/06/2015 showing interval evolution of peripancreatic edema/inflammation and evolving complex pseudocyst at the head of the pancreas (5.8 x 7.8 x 11.1 cm). Still no evidence for pancreatic necrosis, PD  Not dilated. With some IV fluids, IV narcotics and anti-emetics she felt better, was tolerating ginger ale and crackers and was sent home with narcotics and antirheumatic prescriptions and advised to call the GI clinic in the morning.  Admission 2/4 - 11/12/15 with nausea, emesis and ongoing abdominal pain, mild DKA, lipase 47, glucose 355, triglycerides 241. He was diagnosed with a UTI and discharged on Keflex.  However urine culture was negative and review of the 2/1, 2/4, 2/5 and 2/8 U/As are not sugg of a UTI. Other meds at discharge included ongoing Percocet, Prilosec, metformin, insulin, TriCor, when necessary Phenergan.  Patient returned to the emergency room within 24 hours with recurrent abdominal pain, nausea and vomiting. Lipase is 30, LFTs normal. Glucose 231.  Last ETOH about 4 weeks ago.   Pain not controlled by outpt or inpt narcotics.  Located in [place] mostly. Yellow bilious emesis. 50 # weight loss.  Weights:   11/13/15 102 kg (224 lb 13.9 oz)  11/12/15 107.956 kg (238 lb)  11/10/15 108.4 kg (238 lb 15.7 oz)   10/16/15 122.471 kg (270 lb)  06/25/15 123.378 kg (272 lb)  05/22/15 126.1 kg (278 lb)         Past Medical History  Diagnosis Date  . Hypertension   . Chronic bronchitis (  [place])     "get it about q year" (11/20/2013)  . DM type 1 (diabetes mellitus, type 1) ([place]) 11/2013    , went straight to Insulin.   Marland Kitchen Hypertriglyceridemia   . Microalbuminuria   . Molluscum contagiosum 06/2015    upper extremities.   . Hepatic steatosis 11/2013    seen on CT, LFTs normal. Rare if any ETOH  . Pancreatitis     Past Surgical History  Procedure Laterality Date  . Tonsillectomy and adenoidectomy  ~ 1999  . Esophagogastroduodenoscopy N/A 11/21/2013    Procedure:  ESOPHAGOGASTRODUODENOSCOPY (EGD);  Surgeon: Wonda Horner, MD;  Location[facility] ENDOSCOPY;  Service: Endoscopy;  Laterality: N/A;    Prior to Admission medications   Medication Sig Start Date End Date Taking? Authorizing Provider  acetaminophen (TYLENOL) 325 MG tablet Take 2 tablets (650 mg total) by mouth every 6 (six) hours as needed for mild pain (or Fever >/= 101). 11/12/15  Yes Velvet Bathe, MD  cephALEXin (KEFLEX) 500 MG capsule Take 1 capsule (500 mg total) by mouth 2 (two) times daily. 11/12/15  Yes Velvet Bathe, MD  fenofibrate (TRICOR) 145 MG tablet Take 1 tablet (145 mg total) by mouth daily. 05/22/15  Yes Arnoldo Morale, MD  insulin aspart (NOVOLOG) 100 UNIT/ML injection Inject 3-10 Units into the skin 3 (three) times daily. Blood Glucose 150 - 200, give 3 units Blood Glucose 201 - 250, give 5 units Blood Glucose 251 - 300, give 7 units Blood Glucose 301 - 350, give 10 units 10/31/15  Yes Tresa Garter, MD  Insulin Glargine (LANTUS) 100 UNIT/ML Solostar Pen Inject 45 Units into the skin daily at 10 pm. 10/31/15  Yes Tresa Garter, MD  lisinopril (PRINIVIL,ZESTRIL) 10 MG tablet Take 1 tablet (10 mg total) by mouth daily. 06/25/15  Yes Arnoldo Morale, MD  metFORMIN (GLUCOPHAGE) 500 MG tablet Take 2 tablets (1,000 mg total) by mouth 2 (two) times daily with a meal. 06/25/15  Yes Arnoldo Morale, MD  omeprazole (PRILOSEC) 40 MG capsule Take 1 capsule (40 mg total) by mouth daily. 10/31/15  Yes Tresa Garter, MD  promethazine (PHENERGAN) 25 MG tablet Take 1 tablet (25 mg total) by mouth every 8 (eight) hours as needed for nausea or vomiting. 11/12/15  Yes Velvet Bathe, MD  Blood Glucose Monitoring Suppl (ACCU-CHEK AVIVA PLUS) W/DEVICE KIT Check blood sugars 4 times daily (before meals and at bedtime) 12/20/14   Tresa Garter, MD  glucose blood (ACCU-CHEK AVIVA) test strip Use as instructed 12/20/14   Tresa Garter, MD  Lancet Devices (ACCU-CHEK [facility]) lancets Use as instructed  12/20/14   Tresa Garter, MD  oxyCODONE-acetaminophen (PERCOCET/ROXICET) 5-325 MG tablet Take 1 tablet by mouth every 6 (six) hours as needed for severe pain. Patient not taking: Reported on 11/13/2015 11/06/15   Dalia Heading, PA-C    Scheduled Meds: . sodium chloride   Intravenous Once  . cephALEXin  500 mg Oral BID  . enoxaparin (LOVENOX) injection  0.5 mg/kg Subcutaneous Q24H  . fenofibrate  160 mg Oral Daily  . insulin aspart  0-9 Units Subcutaneous 6 times per day  . insulin glargine  30 Units Subcutaneous Q2200  . lisinopril  10 mg Oral Daily  . pantoprazole  80 mg Oral Daily   Infusions: . sodium chloride 125 mL/hr at 11/14/15 1020   PRN Meds: acetaminophen, diphenhydrAMINE, HYDROmorphone (DILAUDID) injection, ondansetron (ZOFRAN) IV   Allergies as of 11/13/2015  . (No Known Allergies)  Family History  Problem Relation Age of Onset  . Diabetes Mellitus II Father   . Heart failure Father   . Asthma Mother   . Diabetes Mellitus I Sister     Social History   Social History  . Marital Status: Single    Spouse Name: N/A  . Number of Children: N/A  . Years of Education: N/A   Occupational History  . Not on file.   Social History Main Topics  . Smoking status: Former Smoker -- 0.00 packs/day for 3 years    Types: Cigarettes    Quit date: 12/11/2013  . Smokeless tobacco: Never Used  . Alcohol Use: Yes     Comment: drinks beer-last drink was New Year's Day  . Drug Use: 2.00 per week    Special: Marijuana     Comment: 11/20/2013 "used to use marijuana; used it last < 1 month ago"  . Sexual Activity: Yes    Birth Control/ Protection: Condom   Other Topics Concern  . Not on file   Social History Narrative    REVIEW OF SYSTEMS: Constitutional:  Feels worn out and tired ENT:  No nose bleeds Pulm:  No SOB or cough.  Difficulty taking deep breath as it increases the abd pain CV:  No palpitations, no LE edema.  GU:  No hematuria, no frequency GI:   Per HPI Heme:  No unusual bleeding or bruising   Transfusions:  None ever Neuro:  No headaches, no peripheral tingling or numbness Derm:  No itching, no rash or sores.  Endocrine:  No sweats or chills.  No polyuria or dysuria Immunization: flu shot 10/31/15 Travel:  None beyond local counties in last few months.    PHYSICAL EXAM: Vital signs in last 24 hours: Filed Vitals:   11/13/15 2041 11/14/15 1020  BP: 135/59 123/60  Pulse: 102 96  Temp: 98.4 F (36.9 C)   Resp: 18    Wt Readings from Last 3 Encounters:  11/13/15 102 kg (224 lb 13.9 oz)  11/12/15 107.956 kg (238 lb)  11/10/15 108.4 kg (238 lb 15.7 oz)   General: obese, unwell appearing AAM.  uncomfortable Head:  No asymmetry or signs of trauma  Eyes:  No icterus or pallor Ears:  Not HOH  Nose:  No congestion or discharge Mouth:  Clear and moist Neck:  No mass, no TMG, no JVD Lungs:  Clear bil.  No cough or dyspnea Heart: RRR, slightly tachy.  No  Mrg.  S1/S2 audible Abdomen:  Obese.  BS present but hypoactive.  No tinkling or tympanitic BS.  Tender diffusely, worse on right abdomen.  No guard or rebound.  Unable to palpate deeply enough to appreciate any masses or HSM.   Rectal: deferred.    Musc/Skeltl: no joint swelling, redness or deformities Extremities:  No CCE  Neurologic:  Oriented x 3.  Fully alert.  No limb weakness or tremor. Skin:  Molluscum on his arms Tattoos:  On UE Nodes:  None in neck     Psych:  Somewhat depressed.  Cooperative, calm.   Intake/Output from previous day: 02/08 0701 - 02/09 0700 In: -  Out: 200 [Urine:200] Intake/Output this shift: Total I/O In: 0  Out: 200 [Urine:200]  LAB RESULTS:  Recent Labs  11/12/15 2158 11/13/15 1400  WBC 7.4 7.2  HGB 12.6* 12.9*  HCT 37.2* 37.9*  PLT 349 392   BMET Lab Results  Component Value Date   NA 138 11/13/2015   NA 137 11/12/2015  NA 135 11/11/2015   K 3.8 11/13/2015   K 3.5 11/12/2015   K 3.5 11/11/2015   CL 95* 11/13/2015    CL 96* 11/12/2015   CL 97* 11/11/2015   CO2 25 11/13/2015   CO2 22 11/12/2015   CO2 23 11/11/2015   GLUCOSE 231* 11/13/2015   GLUCOSE 214* 11/12/2015   GLUCOSE 190* 11/11/2015   BUN 12 11/13/2015   BUN 9 11/12/2015   BUN <5* 11/11/2015   CREATININE 1.17 11/13/2015   CREATININE 1.21 11/12/2015   CREATININE 1.00 11/11/2015   CALCIUM 10.2 11/13/2015   CALCIUM 9.7 11/12/2015   CALCIUM 8.7* 11/11/2015   LFT  Recent Labs  11/12/15 2158 11/13/15 1400  PROT 8.1 8.1  ALBUMIN 3.3* 3.4*  AST 17 18  ALT 22 21  ALKPHOS 115 112  BILITOT 1.1 1.0   PT/INR Lab Results  Component Value Date   INR 1.02 10/17/2015   Hepatitis Panel No results for input(s): HEPBSAG, HCVAB, HEPAIGM, HEPBIGM in the last 72 hours. C-Diff No components found for: CDIFF Lipase     Component Value Date/Time   LIPASE 32 11/13/2015 1400    Drugs of Abuse     Component Value Date/Time   LABOPIA NONE DETECTED 10/16/2015 2351   COCAINSCRNUR NONE DETECTED 10/16/2015 2351   LABBENZ NONE DETECTED 10/16/2015 2351   AMPHETMU NONE DETECTED 10/16/2015 2351   THCU NONE DETECTED 10/16/2015 2351   LABBARB NONE DETECTED 10/16/2015 2351     RADIOLOGY STUDIES: No results found.  ENDOSCOPIC STUDIES: Per HPI  IMPRESSION:   *  Ongoing, evolving acute pancreatitis.  Now with pseudocyts.   Etiology of pancreatitis: ETOH and hypertriglyceridemia.   *  Poorly controlled insulin requiring type 2 DM.    *  Fatty liver  *  ??? UTI.  Reviewed labs of 3 U/As and the urine clx last week, unconvincing for UTI.  Continues on Keflex.    PLAN:     *  Per Dr Silverio Decamp.   *  I increased IV NS from 125 to 225 cc per hour.    *  Not sure I would continue the Keflex, as the urine clx was negative and he never had a proper U/A to start with. I am going to stop this.  Hate for him to get C diff on top of all his other issues.    Azucena Freed  11/14/2015, 12:07 PM Pager: [phone]    Attending physician's note   I  have taken a history, examined the patient and reviewed the chart. I agree with the Advanced Practitioner's note, impression and recommendations. 14 yr M with sub acute pancreatitis in the setting of Hypertriglyceridemia, uncontrolled DM and ?ongoing ETOH. He has a pseudocyst but its evolving and the cyst wall is likely too thin to drain, will not recommend any intervention given no evidence of necrotizing pancreatitis or infection of the pseudocyst. No fever or leucocytosis. Repeat imaging (CT abd with contrast ) in 6-8 weeks to reevaluate the pseudocyst Cont IV fluids Pain control as needed PO liquids sips as tolerated We will sign off, but available for any questions  K Denzil Magnuson, MD [phone] Mon-Fri 8a-5p [phone] after 5p, weekends, holidays

## 2015-11-14 NOTE — Progress Notes (Signed)
Utilization review completed. Aviel Davalos, RN, BSN. 

## 2015-11-15 DIAGNOSIS — E669 Obesity, unspecified: Secondary | ICD-10-CM | POA: Diagnosis present

## 2015-11-15 DIAGNOSIS — E1165 Type 2 diabetes mellitus with hyperglycemia: Secondary | ICD-10-CM

## 2015-11-15 DIAGNOSIS — Z794 Long term (current) use of insulin: Secondary | ICD-10-CM

## 2015-11-15 LAB — CULTURE, BLOOD (ROUTINE X 2)
Culture: NO GROWTH
Culture: NO GROWTH

## 2015-11-15 LAB — URINE CULTURE: CULTURE: NO GROWTH

## 2015-11-15 LAB — GLUCOSE, CAPILLARY
GLUCOSE-CAPILLARY: 178 mg/dL — AB (ref 65–99)
Glucose-Capillary: 169 mg/dL — ABNORMAL HIGH (ref 65–99)
Glucose-Capillary: 150 mg/dL — ABNORMAL HIGH (ref 65–99)
Glucose-Capillary: 158 mg/dL — ABNORMAL HIGH (ref 65–99)
Glucose-Capillary: 181 mg/dL — ABNORMAL HIGH (ref 65–99)

## 2015-11-15 MED ORDER — POLYETHYLENE GLYCOL 3350 17 G PO PACK
17.0000 g | PACK | Freq: Every day | ORAL | Status: DC
  Administered 2015-11-15 – 2015-11-19 (×5): 17 g via ORAL
  Filled 2015-11-15 (×5): qty 1

## 2015-11-15 MED ORDER — CLONIDINE HCL 0.1 MG PO TABS
0.1000 mg | ORAL_TABLET | Freq: Every day | ORAL | Status: DC
  Administered 2015-11-16 – 2015-11-18 (×3): 0.1 mg via ORAL
  Filled 2015-11-15 (×3): qty 1

## 2015-11-15 NOTE — Progress Notes (Signed)
PROGRESS NOTE  Brent Costa UJW:[id number] DOB: 10-May-1991 DOA: 11/13/2015 PCP: Jeanann Lewandowsky, MD  HPI/Recap of past 24 hours: 25 yr old male with past medical history of poorly controlled diabetes mellitus who was first hospitalized for pancreatitis in January (was felt to be secondary to hypertriglyceridemia and possibly some alcohol contribution) & subsequent admissions (x2) for recurrent pancreatitis.  During most recent hospitalization a few days ago, CT scan noted new finding of a pancreatic pseudocyst.   Patient still having fair amount of pain, although better with changing pain medication to dilaudid.  See by GI.  Recommendations for psuedocyst drainage would be at a tertiary care facility ~6 weeks after pancreatitis flare has settled down.  No BM x 3 days.    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 Will stop both lisinopril & tricor as both can potentially cause pancreatitis & treat conservatively.   Active Problems: Constipation: Start  Miralax, secondary to opioids.    Diabetes mellitus uncontrolled w/out complication [facility]): Very poorly controlled. A1c last month 12. Increased Lantus, patient currently on clears.  CBG's now under 200.  Hypertriglyceridemia: Stopped tricor as above.  Will try Niacin once taking more po    Abdominal pain    Tobacco use: Counseled  Obesity: Patient is criteria with BMI greater than 35  Essential hypertension: Stopped lisinopril as above.  Try clonidine.  Code Status: Full Code    Family Communication: Mom & fiance at the bedside   Disposition Plan: Likely here for several days    Consultants:  [facility]   Procedures:  None   Antibiotics:  None    Objective: BP 131/50 mmHg  Pulse 95  Temp(Src) 98.6 F (37  C) (Oral)  Resp 18  Ht  (1.753 m)  Wt 102 kg (224 lb 13.9 oz)  BMI 33.19 kg/m2  SpO2 100%  Intake/Output Summary (Last 24 hours) at 11/15/15 1250 Last data filed at 11/15/15 1200  Gross per 24 hour  Intake 3416.67 ml  Output   1775 ml  Net 1641.67 ml   Filed Weights   11/13/15 2041  Weight: 102 kg (224 lb 13.9 oz)    Exam: Unchanged from previous  General: allergic minute 3 this    Cardiovascular: RRR S1S2   Respiratory: CTA Bilaterally  Abdomen: Soft, tender in the midepigastric area, hypoactive bowel sounds   Musculoskeletal: no edema    Data Reviewed: Basic Metabolic Panel:  Recent Labs Lab 11/09/15 2246 11/10/15 0605 11/11/15 0636 11/12/15 2158 11/13/15 1400  NA 135 135 135 137 138  K 3.9 3.9 3.5 3.5 3.8  CL 97* 101 97* 96* 95*  CO2 GLUCOSE 251* 204* 190* 214* 231*  BUN 11 8 <5* 9 12  CREATININE 1.04 0.90 1.00 1.21 1.17  CALCIUM 8.5* 8.6* 8.7* 9.7 10.2   Liver Function Tests:  Recent Labs Lab 11/09/15 0740 11/12/15 2158 11/13/15 1400  AST 11* 17 18  ALT 11* 22 21  ALKPHOS 90 115 112  BILITOT 1.7* 1.1 1.0  PROT 7.6 8.1 8.1  ALBUMIN 3.5 3.3* 3.4*    Recent Labs Lab 11/09/15 0740 11/11/15 0636 11/12/15 2158 11/13/15 1400  LIPASE 32   No results for input(s): AMMONIA in the last 168 hours. CBC:  Recent Labs Lab 11/09/15 0740 11/10/15 5621 11/11/15 0636  11/12/15 2158 11/13/15 1400  WBC 7.0 6.4 6.6 7.4 7.2  NEUTROABS 4.4  --   --   --   --   HGB 13.6 11.9* 11.1* 12.6* 12.9*  HCT 39.9 34.9* 32.6* 37.2* 37.9*  MCV 81.6 80.6 80.5 79.1 79.0  PLT 288 276 256 349 392   Cardiac Enzymes:   No results for input(s): CKTOTAL, CKMB, CKMBINDEX, TROPONINI in the last 168 hours. BNP (last 3 results) No results for input(s): BNP in the last 8760 hours.  ProBNP (last 3 results) No results for input(s): PROBNP in the last 8760 hours.  CBG:  Recent Labs Lab 11/14/15 2022 11/14/15 2350 11/15/15 0559  11/15/15 0728 11/15/15 1144  GLUCAP 166* 151* 169* 150* 178*    Recent Results (from the past 240 hour(s))  Culture, blood (Routine X 2) w Reflex to ID Panel     Status: None (Preliminary result)   Collection Time: 11/10/15  6:05 AM  Result Value Ref Range Status   Specimen Description BLOOD RIGHT ANTECUBITAL  Final   Special Requests BOTTLES DRAWN AEROBIC ONLY 5CCS  Final   Culture NO GROWTH 4 DAYS  Final   Report Status PENDING  Incomplete  Culture, blood (Routine X 2) w Reflex to ID Panel     Status: None (Preliminary result)   Collection Time: 11/10/15  6:16 AM  Result Value Ref Range Status   Specimen Description BLOOD RIGHT HAND  Final   Special Requests AEROCOCCUS SPECIES 6CCS  Final   Culture NO GROWTH 4 DAYS  Final   Report Status PENDING  Incomplete  Urine culture     Status: None   Collection Time: 11/10/15  6:42 PM  Result Value Ref Range Status   Specimen Description URINE, CLEAN CATCH  Final   Special Requests NONE  Final   Culture NO GROWTH 1 DAY  Final   Report Status 11/11/2015 FINAL  Final     Studies: No results found.  Scheduled Meds: . sodium chloride   Intravenous Once  . [START ON 11/16/2015] cloNIDine  0.1 mg Oral Daily  . enoxaparin (LOVENOX) injection  0.5 mg/kg Subcutaneous Q24H  . feeding supplement  1 Container Oral TID BM  . insulin aspart  0-9 Units Subcutaneous 6 times per day  . insulin glargine  34 Units Subcutaneous Q2200  . pantoprazole  80 mg Oral Daily  . polyethylene glycol  17 g Oral Daily    Continuous Infusions: . sodium chloride 225 mL/hr at 11/15/15 1017     Time spent: 25 minutes   Hollice Espy  [facility] Pager [phone] . If 7PM-7AM, please contact night-coverage at [email or web address], password [facility] 11/15/2015, 12:50 PM  LOS: 2 days

## 2015-11-16 DIAGNOSIS — I10 Essential (primary) hypertension: Secondary | ICD-10-CM

## 2015-11-16 LAB — GLUCOSE, CAPILLARY
GLUCOSE-CAPILLARY: 112 mg/dL — AB (ref 65–99)
GLUCOSE-CAPILLARY: 142 mg/dL — AB (ref 65–99)
GLUCOSE-CAPILLARY: 135 mg/dL — AB (ref 65–99)
Glucose-Capillary: 100 mg/dL — ABNORMAL HIGH (ref 65–99)
Glucose-Capillary: 114 mg/dL — ABNORMAL HIGH (ref 65–99)
Glucose-Capillary: 158 mg/dL — ABNORMAL HIGH (ref 65–99)
Glucose-Capillary: 93 mg/dL (ref 65–99)

## 2015-11-16 MED ORDER — METOCLOPRAMIDE HCL 5 MG/ML IJ SOLN
5.0000 mg | Freq: Four times a day (QID) | INTRAMUSCULAR | Status: DC
  Administered 2015-11-16 – 2015-11-18 (×9): 5 mg via INTRAVENOUS
  Filled 2015-11-16 (×9): qty 2

## 2015-11-16 NOTE — Progress Notes (Signed)
PROGRESS NOTE  DELMORE SEAR ZOX:[id number] DOB: [birth year] DOA: 11/13/2015 PCP: Jeanann Lewandowsky, MD  HPI/Recap of past 24 hours: 25 yr old male with past medical history of poorly controlled diabetes mellitus who was first hospitalized for pancreatitis in January (was felt to be secondary to hypertriglyceridemia and possibly some alcohol contribution) & subsequent admissions (x2) for recurrent pancreatitis.  During most recent hospitalization a few days ago, CT scan noted new finding of a pancreatic pseudocyst.   Patient still having fair amount of pain, although better with changing pain medication to dilaudid. He complains of pain all over his abdomen  See by GI.  Recommendations for psuedocyst drainage would be at a tertiary care facility ~6 weeks after pancreatitis flare has settled down.  Responded to MiraLAX.   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 Stopped both lisinopril & tricor as both can potentially cause pancreatitis & treat conservatively.   Active Problems: Constipation: Start  Miralax, secondary to opioids.    Diabetes mellitus uncontrolled w/out complication [facility]): Very poorly controlled. A1c last month 12. Increased Lantus, patient currently on clears.  CBG's now under 200. Given poor control, question if gastroparesis may be playing a role. Have started Reglan  Hypertriglyceridemia: Stopped tricor as above.  Will try Niacin once taking more po    Abdominal pain    Tobacco use: Counseled  Obesity: Patient is criteria with BMI greater than 35  Essential hypertension: Stopped lisinopril as above.  Blood pressure stable with clonidine  Code Status: Full Code    Family Communication: Fianc's sleep and room  Disposition Plan: Hoping to discharge in next  few days   Consultants:  [facility]   Procedures:  None   Antibiotics:  None    Objective: BP 138/66 mmHg  Pulse 84  Temp(Src) 98.8 F (37.1 C) (Oral)  Resp 18  Ht  (1.753 m)  Wt 102 kg (224 lb 13.9 oz)  BMI 33.19 kg/m2  SpO2 100%  Intake/Output Summary (Last 24 hours) at 11/16/15 1555 Last data filed at 11/16/15 1437  Gross per 24 hour  Intake   6915 ml  Output    700 ml  Net   6215 ml   Filed Weights   11/13/15 2041  Weight: 102 kg (224 lb 13.9 oz)    Exam:   General: Alert and oriented 3, no acute distress   Cardiovascular: RRR S1S2   Respiratory: CTA Bilaterally  Abdomen: Soft, nonspecific generalized tenderness, hypoactive bowel sounds   Musculoskeletal: no edema    Data Reviewed: Basic Metabolic Panel:  Recent Labs Lab 11/09/15 2246 11/10/15 0605 11/11/15 0636 11/12/15 2158 11/13/15 1400  NA 135 135 135 137 138  K 3.9 3.9 3.5 3.5 3.8  CL 97* 101 97* 96* 95*  CO2 GLUCOSE 251* 204* 190* 214* 231*  BUN 11 8 <5* 9 12  CREATININE 1.04 0.90 1.00 1.21 1.17  CALCIUM 8.5* 8.6* 8.7* 9.7 10.2   Liver Function Tests:  Recent Labs Lab 11/12/15 2158 11/13/15 1400  AST 17 18  ALT 22 21  ALKPHOS 115 112  BILITOT 1.1 1.0  PROT 8.1 8.1  ALBUMIN 3.3* 3.4*    Recent Labs Lab 11/11/15 0636 11/12/15 2158 11/13/15 1400  LIPASE 30 30 32   No results for input(s): AMMONIA in the last 168 hours.  CBC:  Recent Labs Lab 11/10/15 0605 11/11/15 0636 11/12/15 2158 11/13/15 1400  WBC 6.4 6.6 7.4 7.2  HGB 11.9* 11.1* 12.6* 12.9*  HCT 34.9* 32.6* 37.2* 37.9*  MCV 80.6 80.5 79.1 79.0  PLT 276 256 349 392   Cardiac Enzymes:   No results for input(s): CKTOTAL, CKMB, CKMBINDEX, TROPONINI in the last 168 hours. BNP (last 3 results) No results for input(s): BNP in the last 8760 hours.  ProBNP (last 3 results) No results for input(s): PROBNP in the last 8760 hours.  CBG:  Recent Labs Lab 11/16/15 0011  11/16/15 0437 11/16/15 0749 11/16/15 1159 11/16/15 1535  GLUCAP 158* 112* 100* 114* 142*    Recent Results (from the past 240 hour(s))  Culture, blood (Routine X 2) w Reflex to ID Panel     Status: None   Collection Time: 11/10/15  6:05 AM  Result Value Ref Range Status   Specimen Description BLOOD RIGHT ANTECUBITAL  Final   Special Requests BOTTLES DRAWN AEROBIC ONLY 5CCS  Final   Culture NO GROWTH 5 DAYS  Final   Report Status 11/15/2015 FINAL  Final  Culture, blood (Routine X 2) w Reflex to ID Panel     Status: None   Collection Time: 11/10/15  6:16 AM  Result Value Ref Range Status   Specimen Description BLOOD RIGHT HAND  Final   Special Requests AEROCOCCUS SPECIES 6CCS  Final   Culture NO GROWTH 5 DAYS  Final   Report Status 11/15/2015 FINAL  Final  Urine culture     Status: None   Collection Time: 11/10/15  6:42 PM  Result Value Ref Range Status   Specimen Description URINE, CLEAN CATCH  Final   Special Requests NONE  Final   Culture NO GROWTH 1 DAY  Final   Report Status 11/11/2015 FINAL  Final  Urine culture     Status: None   Collection Time: 11/13/15 10:24 PM  Result Value Ref Range Status   Specimen Description URINE, CLEAN CATCH  Final   Special Requests PATIENT ON FOLLOWING Keflex  Final   Culture NO GROWTH 2 DAYS  Final   Report Status 11/15/2015 FINAL  Final     Studies: No results found.  Scheduled Meds: . sodium chloride   Intravenous Once  . cloNIDine  0.1 mg Oral Daily  . enoxaparin (LOVENOX) injection  0.5 mg/kg Subcutaneous Q24H  . feeding supplement  1 Container Oral TID BM  . insulin aspart  0-9 Units Subcutaneous 6 times per day  . insulin glargine  34 Units Subcutaneous Q2200  . metoCLOPramide (REGLAN) injection  5 mg Intravenous 4 times per day  . pantoprazole  80 mg Oral Daily  . polyethylene glycol  17 g Oral Daily    Continuous Infusions: . sodium chloride 225 mL/hr at 11/16/15 1055     Time spent: 15 minutes   Hollice Espy  [facility] Pager [phone] . If 7PM-7AM, please contact night-coverage at [email or web address], password [facility] 11/16/2015, 3:55 PM  LOS: 3 days

## 2015-11-17 DIAGNOSIS — E1065 Type 1 diabetes mellitus with hyperglycemia: Secondary | ICD-10-CM

## 2015-11-17 DIAGNOSIS — E1040 Type 1 diabetes mellitus with diabetic neuropathy, unspecified: Secondary | ICD-10-CM

## 2015-11-17 LAB — GLUCOSE, CAPILLARY
GLUCOSE-CAPILLARY: 98 mg/dL (ref 65–99)
GLUCOSE-CAPILLARY: 104 mg/dL — AB (ref 65–99)
GLUCOSE-CAPILLARY: 95 mg/dL (ref 65–99)
Glucose-Capillary: 110 mg/dL — ABNORMAL HIGH (ref 65–99)
Glucose-Capillary: 121 mg/dL — ABNORMAL HIGH (ref 65–99)

## 2015-11-17 NOTE — Progress Notes (Signed)
PROGRESS NOTE  Brent Costa:[id number] DOB: 1991-08-09 DOA: 11/13/2015 PCP: Jeanann Lewandowsky, MD  HPI/Recap of past 24 hours: 25 yr old male with past medical history of poorly controlled diabetes mellitus who was first hospitalized for pancreatitis in January (was felt to be secondary to hypertriglyceridemia and possibly some alcohol contribution) & subsequent admissions (x2) for recurrent pancreatitis.  During most recent hospitalization a few days ago, CT scan noted new finding of a pancreatic pseudocyst.   Patient still having fair amount of pain, although better with changing pain medication to dilaudid. He complains of pain all over his abdomen  See by GI.  Recommendations for psuedocyst drainage would be at a tertiary care facility ~6 weeks after pancreatitis flare has settled down.  Constipation responded well to MiraLAX. Patient started on IV Reglan yesterday and thoughts that this may be gastroparesis from poorly controlled diabetes mellitus. Today, he states he's feeling overall a little bit better. 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 Stopped both lisinopril & tricor as both can potentially cause pancreatitis & treat conservatively.   Active Problems: Constipation: Start  Miralax, secondary to opioids.    Diabetes mellitus uncontrolled w/out complication [facility]): Very poorly controlled. A1c last month 12. Increased Lantus, patient currently on clears.  CBG's now under 200. Given poor control, question if gastroparesis may be playing a role. Have started Reglan  Hypertriglyceridemia: Stopped tricor as above.  Will try Niacin once taking more po    Abdominal pain:? Gastroparesis versus pancreatitis. Have started Reglan which seems to  show some benefits. If not much progress, will check gastric emptying study tomorrow.    Tobacco use: Counseled  Obesity: Patient is criteria with BMI greater than 35  Essential hypertension: Stopped lisinopril as above.  Blood pressure stable with clonidine  Code Status: Full Code    Family Communication: Patient declined for me to update family today  Disposition Plan: Discharge once diet advanced to solid food, possibly as early as tomorrow or Tuesday   Consultants:  [facility]   Procedures:  None   Antibiotics:  None    Objective: BP 154/90 mmHg  Pulse 84  Temp(Src) 98.6 F (37 C) (Oral)  Resp 17  Ht  (1.753 m)  Wt 102 kg (224 lb 13.9 oz)  BMI 33.19 kg/m2  SpO2 100%  Intake/Output Summary (Last 24 hours) at 11/17/15 1603 Last data filed at 11/17/15 1400  Gross per 24 hour  Intake 4651.67 ml  Output   1150 ml  Net 3501.67 ml   Filed Weights   11/13/15 2041  Weight: 102 kg (224 lb 13.9 oz)    Exam: Little change from previous day  General: Alert and oriented 3, no acute distress   Cardiovascular: RRR S1S2   Respiratory: CTA Bilaterally  Abdomen: Few bowel sounds, nonspecific generalized tenderness  Musculoskeletal: no edema    Data Reviewed: Basic Metabolic Panel:  Recent Labs Lab 11/11/15 0636 11/12/15 2158 11/13/15 1400  NA 135 137 138  K 3.5 3.5 3.8  CL 97* 96* 95*  CO2 GLUCOSE 190* 214* 231*  BUN <5* 9 12  CREATININE 1.00 1.21 1.17  CALCIUM 8.7* 9.7 10.2   Liver Function Tests:  Recent Labs Lab 11/12/15 2158 11/13/15 1400  AST 17 18  ALT 22 21  ALKPHOS 115 112  BILITOT 1.1 1.0  PROT 8.1 8.1  ALBUMIN 3.3* 3.4*    Recent Labs Lab 11/11/15 0636 11/12/15 2158 11/13/15 1400  LIPASE 30 30 32   No results for input(s): AMMONIA in the last 168 hours. CBC:  Recent Labs Lab 11/11/15 0636 11/12/15 2158 11/13/15 1400  WBC 6.6 7.4 7.2  HGB 11.1* 12.6* 12.9*  HCT 32.6* 37.2* 37.9*  MCV 80.5 79.1  79.0  PLT 256 349 392   Cardiac Enzymes:   No results for input(s): CKTOTAL, CKMB, CKMBINDEX, TROPONINI in the last 168 hours. BNP (last 3 results) No results for input(s): BNP in the last 8760 hours.  ProBNP (last 3 results) No results for input(s): PROBNP in the last 8760 hours.  CBG:  Recent Labs Lab 11/16/15 1955 11/16/15 2339 11/17/15 0331 11/17/15 0803 11/17/15 1242  GLUCAP 135* 93 110* 98 104*    Recent Results (from the past 240 hour(s))  Culture, blood (Routine X 2) w Reflex to ID Panel     Status: None   Collection Time: 11/10/15  6:05 AM  Result Value Ref Range Status   Specimen Description BLOOD RIGHT ANTECUBITAL  Final   Special Requests BOTTLES DRAWN AEROBIC ONLY 5CCS  Final   Culture NO GROWTH 5 DAYS  Final   Report Status 11/15/2015 FINAL  Final  Culture, blood (Routine X 2) w Reflex to ID Panel     Status: None   Collection Time: 11/10/15  6:16 AM  Result Value Ref Range Status   Specimen Description BLOOD RIGHT HAND  Final   Special Requests AEROCOCCUS SPECIES 6CCS  Final   Culture NO GROWTH 5 DAYS  Final   Report Status 11/15/2015 FINAL  Final  Urine culture     Status: None   Collection Time: 11/10/15  6:42 PM  Result Value Ref Range Status   Specimen Description URINE, CLEAN CATCH  Final   Special Requests NONE  Final   Culture NO GROWTH 1 DAY  Final   Report Status 11/11/2015 FINAL  Final  Urine culture     Status: None   Collection Time: 11/13/15 10:24 PM  Result Value Ref Range Status   Specimen Description URINE, CLEAN CATCH  Final   Special Requests PATIENT ON FOLLOWING Keflex  Final   Culture NO GROWTH 2 DAYS  Final   Report Status 11/15/2015 FINAL  Final     Studies: No results found.  Scheduled Meds: . cloNIDine  0.1 mg Oral Daily  . enoxaparin (LOVENOX) injection  0.5 mg/kg Subcutaneous Q24H  . feeding supplement  1 Container Oral TID BM  . insulin aspart  0-9 Units Subcutaneous 6 times per day  . insulin glargine  34 Units  Subcutaneous Q2200  . metoCLOPramide (REGLAN) injection  5 mg Intravenous 4 times per day  . pantoprazole  80 mg Oral Daily  . polyethylene glycol  17 g Oral Daily    Continuous Infusions: . sodium chloride 125 mL/hr at 11/17/15 1106     Time spent: 15 minutes   Hollice Espy  [facility] Pager [phone] . If 7PM-7AM, please contact night-coverage at [email or web address], password [facility] 11/17/2015, 4:03 PM  LOS: 4 days

## 2015-11-18 LAB — GLUCOSE, CAPILLARY
GLUCOSE-CAPILLARY: 101 mg/dL — AB (ref 65–99)
GLUCOSE-CAPILLARY: 104 mg/dL — AB (ref 65–99)
GLUCOSE-CAPILLARY: 86 mg/dL (ref 65–99)
GLUCOSE-CAPILLARY: 90 mg/dL (ref 65–99)
Glucose-Capillary: 116 mg/dL — ABNORMAL HIGH (ref 65–99)
Glucose-Capillary: 124 mg/dL — ABNORMAL HIGH (ref 65–99)
Glucose-Capillary: 95 mg/dL (ref 65–99)

## 2015-11-18 MED ORDER — CLONIDINE HCL 0.2 MG PO TABS
0.2000 mg | ORAL_TABLET | Freq: Every day | ORAL | Status: DC
  Administered 2015-11-19: 0.2 mg via ORAL
  Filled 2015-11-18: qty 1

## 2015-11-18 MED ORDER — HYDROMORPHONE HCL 1 MG/ML IJ SOLN
0.5000 mg | INTRAMUSCULAR | Status: DC | PRN
  Administered 2015-11-18 – 2015-11-19 (×5): 0.5 mg via INTRAVENOUS
  Filled 2015-11-18 (×5): qty 1

## 2015-11-18 MED ORDER — METOCLOPRAMIDE HCL 5 MG/ML IJ SOLN
10.0000 mg | Freq: Four times a day (QID) | INTRAMUSCULAR | Status: DC
  Administered 2015-11-18 – 2015-11-19 (×4): 10 mg via INTRAVENOUS
  Filled 2015-11-18 (×4): qty 2

## 2015-11-18 NOTE — Progress Notes (Signed)
  iv zofran administered for nausea, pt threw up some pink colored emesis this evening after having dinner

## 2015-11-18 NOTE — Progress Notes (Signed)
PROGRESS NOTE  Brent Costa:[id number] DOB: 1991/09/12 DOA: 11/13/2015 PCP: Jeanann Lewandowsky, MD  HPI/Recap of past 24 hours: 25 yr old male with past medical history of poorly controlled diabetes mellitus who was first hospitalized for pancreatitis in January (was felt to be secondary to hypertriglyceridemia and possibly some alcohol contribution) & subsequent admissions (x2) for recurrent pancreatitis.  During most recent hospitalization a few days ago, CT scan noted new finding of a pancreatic pseudocyst.   Patient still having fair amount of pain, although better with changing pain medication to dilaudid. He complains of pain all over his abdomen  See by GI.  Recommendations for psuedocyst drainage would be at a tertiary care facility ~6 weeks after pancreatitis flare has settled down.  Constipation responded well to MiraLAX. Patient started on IV Reglan yesterday and thoughts that this may be gastroparesis from poorly controlled diabetes mellitus. Diet advance to full liquids and patient has been tolerating with Reglan.  Today, patient seen this morning and feeling overall better. Bowels moving. Attempted to advance to solid food and patient started having abdominal pain in the afternoon.     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 Stopped both lisinopril & tricor as both can potentially cause pancreatitis & treat conservatively.   Active Problems: Constipation: Start  Miralax, secondary to opioids.    Diabetes mellitus uncontrolled w/out complication [facility]): Very poorly controlled. A1c last month 12. Increased Lantus, patient currently on clears.  CBG's now under 200. Given poor control, question if gastroparesis may be playing a role. Have started  Reglan  Hypertriglyceridemia: Stopped tricor as above.  Will try Niacin once taking more po    Abdominal pain:? Gastroparesis versus pancreatitis. Have started Reglan which seems to show some benefits. We'll increase Reglan dose and go back to full liquids. Try solid food again tomorrow and if no results, we'll try gastric emptying study  Obesity: Patient meets criteria with BMI greater than 30  Essential hypertension: Stopped lisinopril as above.  Blood pressure stable with clonidine, increased dose for increased systolic pressure, titrating down IV fluids   Code Status: Full Code    Family Communication: Patient declined for me to update family today  Disposition Plan: Discharge once diet advanced to solid food, potential discharge Tuesday   Consultants:  [place]   Procedures:  None   Antibiotics:  None    Objective: BP 154/86 mmHg  Pulse 92  Temp(Src) 98.9 F (37.2 C) (Oral)  Resp 18  Ht  (1.753 m)  Wt 102 kg (224 lb 13.9 oz)  BMI 33.19 kg/m2  SpO2 100%  Intake/Output Summary (Last 24 hours) at 11/18/15 1559 Last data filed at 11/18/15 1537  Gross per 24 hour  Intake 3207.5 ml  Output   2900 ml  Net  307.5 ml   Filed Weights   11/13/15 2041  Weight: 102 kg (224 lb 13.9 oz)    Exam: MR   General: Alert and oriented 3, no acute distress   Cardiovascular: RRR S1S2   Respiratory: CTA Bilaterally  Abdomen: Few bowel sounds, less tenderness  Musculoskeletal: no edema    Data Reviewed: Basic Metabolic Panel:  Recent Labs Lab 11/12/15 2158 11/13/15 1400  NA 137 138  K 3.5 3.8  CL 96* 95*  CO2 22 25  GLUCOSE 214* 231*  BUN 9 12  CREATININE 1.21 1.17  CALCIUM 9.7 10.2   Liver Function Tests:  Recent Labs Lab 11/12/15 2158 11/13/15 1400  AST 17 18  ALT 22 21  ALKPHOS 115 112  BILITOT 1.1 1.0  PROT 8.1 8.1  ALBUMIN 3.3* 3.4*    Recent Labs Lab 11/12/15 2158 11/13/15 1400  LIPASE 30 32   No results for input(s):  AMMONIA in the last 168 hours. CBC:  Recent Labs Lab 11/12/15 2158 11/13/15 1400  WBC 7.4 7.2  HGB 12.6* 12.9*  HCT 37.2* 37.9*  MCV 79.1 79.0  PLT 349 392   Cardiac Enzymes:   No results for input(s): CKTOTAL, CKMB, CKMBINDEX, TROPONINI in the last 168 hours. BNP (last 3 results) No results for input(s): BNP in the last 8760 hours.  ProBNP (last 3 results) No results for input(s): PROBNP in the last 8760 hours.  CBG:  Recent Labs Lab 11/17/15 1939 11/18/15 0004 11/18/15 0414 11/18/15 0757 11/18/15 1240  GLUCAP 95 124* 101* 86 95    Recent Results (from the past 240 hour(s))  Culture, blood (Routine X 2) w Reflex to ID Panel     Status: None   Collection Time: 11/10/15  6:05 AM  Result Value Ref Range Status   Specimen Description BLOOD RIGHT ANTECUBITAL  Final   Special Requests BOTTLES DRAWN AEROBIC ONLY 5CCS  Final   Culture NO GROWTH 5 DAYS  Final   Report Status 11/15/2015 FINAL  Final  Culture, blood (Routine X 2) w Reflex to ID Panel     Status: None   Collection Time: 11/10/15  6:16 AM  Result Value Ref Range Status   Specimen Description BLOOD RIGHT HAND  Final   Special Requests AEROCOCCUS SPECIES 6CCS  Final   Culture NO GROWTH 5 DAYS  Final   Report Status 11/15/2015 FINAL  Final  Urine culture     Status: None   Collection Time: 11/10/15  6:42 PM  Result Value Ref Range Status   Specimen Description URINE, CLEAN CATCH  Final   Special Requests NONE  Final   Culture NO GROWTH 1 DAY  Final   Report Status 11/11/2015 FINAL  Final  Urine culture     Status: None   Collection Time: 11/13/15 10:24 PM  Result Value Ref Range Status   Specimen Description URINE, CLEAN CATCH  Final   Special Requests PATIENT ON FOLLOWING Keflex  Final   Culture NO GROWTH 2 DAYS  Final   Report Status 11/15/2015 FINAL  Final     Studies: No results found.  Scheduled Meds: . cloNIDine  0.1 mg Oral Daily  . enoxaparin (LOVENOX) injection  0.5 mg/kg Subcutaneous  Q24H  . feeding supplement  1 Container Oral TID BM  . insulin aspart  0-9 Units Subcutaneous 6 times per day  . insulin glargine  34 Units Subcutaneous Q2200  . metoCLOPramide (REGLAN) injection  10 mg Intravenous 4 times per day  . pantoprazole  80 mg Oral Daily  . polyethylene glycol  17 g Oral Daily    Continuous Infusions: . sodium chloride 1 mL (11/18/15 0414)     Time spent: 15 minutes   Hollice Espy  [facility] Pager [phone] . If 7PM-7AM, please contact night-coverage at [email or web address], password [facility] 11/18/2015, 3:59 PM  LOS: 5 days

## 2015-11-19 DIAGNOSIS — E1143 Type 2 diabetes mellitus with diabetic autonomic (poly)neuropathy: Principal | ICD-10-CM

## 2015-11-19 DIAGNOSIS — K3184 Gastroparesis: Secondary | ICD-10-CM

## 2015-11-19 LAB — GLUCOSE, CAPILLARY
GLUCOSE-CAPILLARY: 100 mg/dL — AB (ref 65–99)
Glucose-Capillary: 81 mg/dL (ref 65–99)
Glucose-Capillary: 88 mg/dL (ref 65–99)

## 2015-11-19 MED ORDER — METOCLOPRAMIDE HCL 5 MG PO TABS: 5.0000 mg | ORAL_TABLET | Freq: Three times a day (TID) | ORAL | Status: DC

## 2015-11-19 NOTE — Discharge Instructions (Signed)
Gastroparesis °Gastroparesis, also called delayed gastric emptying, is a condition in which food takes longer than normal to empty from the stomach. The condition is usually long-lasting (chronic). °CAUSES °This condition may be caused by: °· An endocrine disorder, such as hypothyroidism or diabetes. Diabetes is the most common cause of this condition. °· A nervous system disease, such as Parkinson disease or multiple sclerosis. °· Cancer, infection, or surgery of the stomach or vagus nerve. °· A connective tissue disorder, such as scleroderma. °· Certain medicines. °In most cases, the cause is not known. °RISK FACTORS °This condition is more likely to develop in: °· People with certain disorders, including endocrine disorders, eating disorders, amyloidosis, and scleroderma. °· People with certain diseases, including Parkinson disease or multiple sclerosis. °· People with cancer or infection of the stomach or vagus nerve. °· People who have had surgery on the stomach or vagus nerve. °· People who take certain medicines. °· Women. °SYMPTOMS °Symptoms of this condition include: °· An early feeling of fullness when eating. °· Nausea. °· Weight loss. °· Vomiting. °· Heartburn. °· Abdominal bloating. °· Inconsistent blood glucose levels. °· Lack of appetite. °· Acid from the stomach coming up into the esophagus (gastroesophageal reflux). °· Spasms of the stomach. °Symptoms may come and go. °DIAGNOSIS °This condition is diagnosed with tests, such as: °· Tests that check how long it takes food to move through the stomach and intestines. These tests include: °¨ Upper gastrointestinal (GI) series. In this test, X-rays of the intestines are taken after you drink a liquid. The liquid makes the intestines show up better on the X-rays. °¨ Gastric emptying scintigraphy. In this test, scans are taken after you eat food that contains a small amount of radioactive material. °¨ Wireless capsule GI monitoring system. This test  involves swallowing a capsule that records information about movement through the stomach. °· Gastric manometry. This test measures electrical and muscular activity in the stomach. It is done with a thin tube that is passed down the throat and into the stomach. °· Endoscopy. This test checks for abnormalities in the lining of the stomach. It is done with a long, thin tube that is passed down the throat and into the stomach. °· An ultrasound. This test can help rule out gallbladder disease or pancreatitis as a cause of your symptoms. It uses sound waves to take pictures of the inside of your body. °TREATMENT °There is no cure for gastroparesis. This condition may be managed with: °· Treatment of the underlying condition causing the gastroparesis. °· Lifestyle changes, including exercise and dietary changes. Dietary changes can include: °¨ Changes in what and when you eat. °¨ Eating smaller meals more often. °¨ Eating low-fat foods. °¨ Eating low-fiber forms of high-fiber foods, such as cooked vegetables instead of raw vegetables. °¨ Having liquid foods in place of solid foods. Liquid foods are easier to digest. °· Medicines. These may be given to control nausea and vomiting and to stimulate stomach muscles. °· Getting food through a feeding tube. This may be done in severe cases. °· A gastric neurostimulator. This is a device that is inserted into the body with surgery. It helps improve stomach emptying and control nausea and vomiting. °HOME CARE INSTRUCTIONS °· Follow your health care provider's instructions about exercise and diet. °· Take medicines only as directed by your health care provider. °SEEK MEDICAL CARE IF: °· Your symptoms do not improve with treatment. °· You have new symptoms. °SEEK IMMEDIATE MEDICAL CARE IF: °· You have   severe abdominal pain that does not improve with treatment.  You have nausea that does not go away.  You cannot keep fluids down.   This information is not intended to replace  advice given to you by your health care provider. Make sure you discuss any questions you have with your health care provider.   Document Released: 09/21/2005 Document Revised: 02/05/2015 Document Reviewed: 09/17/2014 Elsevier Interactive Patient Education 2016 [employer]  Metoclopramide tablets What is this medicine? METOCLOPRAMIDE (met oh kloe PRA mide) is used to treat the symptoms of gastroesophageal reflux disease (GERD) like heartburn. It is also used to treat people with slow emptying of the stomach and intestinal tract. This medicine may be used for other purposes; ask your health care provider or pharmacist if you have questions. What should I tell my health care provider before I take this medicine? They need to know if you have any of these conditions: -breast cancer -depression -diabetes -heart failure -high blood pressure -kidney disease -liver disease -Parkinson's disease or a movement disorder -pheochromocytoma -seizures -stomach obstruction, bleeding, or perforation -an unusual or allergic reaction to metoclopramide, procainamide, sulfites, other medicines, foods, dyes, or preservatives -pregnant or trying to get pregnant -breast-feeding How should I use this medicine? Take this medicine by mouth with a glass of water. Follow the directions on the prescription label. Take this medicine on an empty stomach, about 30 minutes before eating. Take your doses at regular intervals. Do not take your medicine more often than directed. Do not stop taking except on the advice of your doctor or health care professional. A special MedGuide will be given to you by the pharmacist with each prescription and refill. Be sure to read this information carefully each time. Talk to your pediatrician regarding the use of this medicine in children. Special care may be needed. Overdosage: If you think you have taken too much of this medicine contact a poison control center or emergency room  at once. NOTE: This medicine is only for you. Do not share this medicine with others. What if I miss a dose? If you miss a dose, take it as soon as you can. If it is almost time for your next dose, take only that dose. Do not take double or extra doses. What may interact with this medicine? -acetaminophen -cyclosporine -digoxin -medicines for blood pressure -medicines for diabetes, including insulin -medicines for hay fever and other allergies -medicines for depression, especially an Monoamine Oxidase Inhibitor (MAOI) -medicines for Parkinson's disease, like levodopa -medicines for sleep or for pain -tetracycline This list may not describe all possible interactions. Give your health care provider a list of all the medicines, herbs, non-prescription drugs, or dietary supplements you use. Also tell them if you smoke, drink alcohol, or use illegal drugs. Some items may interact with your medicine. What should I watch for while using this medicine? It may take a few weeks for your stomach condition to start to get better. However, do not take this medicine for longer than 12 weeks. The longer you take this medicine, and the more you take it, the greater your chances are of developing serious side effects. If you are an elderly patient, a male patient, or you have diabetes, you may be at an increased risk for side effects from this medicine. Contact your doctor immediately if you start having movements you cannot control such as lip smacking, rapid movements of the tongue, involuntary or uncontrollable movements of the eyes, head, arms and legs, or muscle twitches  and spasms. Patients and their families should watch out for worsening depression or thoughts of suicide. Also watch out for any sudden or severe changes in feelings such as feeling anxious, agitated, panicky, irritable, hostile, aggressive, impulsive, severely restless, overly excited and hyperactive, or not being able to sleep. If this  happens, especially at the beginning of treatment or after a change in dose, call your doctor. Do not treat yourself for high fever. Ask your doctor or health care professional for advice. You may get drowsy or dizzy. Do not drive, use machinery, or do anything that needs mental alertness until you know how this drug affects you. Do not stand or sit up quickly, especially if you are an older patient. This reduces the risk of dizzy or fainting spells. Alcohol can make you more drowsy and dizzy. Avoid alcoholic drinks. What side effects may I notice from receiving this medicine? Side effects that you should report to your doctor or health care professional as soon as possible: -allergic reactions like skin rash, itching or hives, swelling of the face, lips, or tongue -abnormal production of milk in females -breast enlargement in both males and females -change in the way you walk -difficulty moving, speaking or swallowing -drooling, lip smacking, or rapid movements of the tongue -excessive sweating -fever -involuntary or uncontrollable movements of the eyes, head, arms and legs -irregular heartbeat or palpitations -muscle twitches and spasms -unusually weak or tired Side effects that usually do not require medical attention (report to your doctor or health care professional if they continue or are bothersome): -change in sex drive or performance -depressed mood -diarrhea -difficulty sleeping -headache -menstrual changes -restless or nervous This list may not describe all possible side effects. Call your doctor for medical advice about side effects. You may report side effects to FDA at 1-800-FDA-1088. Where should I keep my medicine? Keep out of the reach of children. Store at room temperature between 20 and 25 degrees C (68 and 77 degrees F). Protect from light. Keep container tightly closed. Throw away any unused medicine after the expiration date. NOTE: This sheet is a summary. It may  not cover all possible information. If you have questions about this medicine, talk to your doctor, pharmacist, or health care provider.    2016, [employer]. (2012-01-19 13:04:38)

## 2015-11-19 NOTE — Care Management Note (Signed)
Case Management Note  Patient Details  Name: Brent Costa MRN: [id number] Date of Birth: 08/05/1991  Subjective/Objective:                    Action/Plan:   Expected Discharge Date:                  Expected Discharge Plan:  Home/Self Care  In-House Referral:     Discharge planning Services     Post Acute Care Choice:    Choice offered to:     DME Arranged:    DME Agency:     HH Arranged:    HH Agency:     Status of Service:  In process, will continue to follow  Medicare Important Message Given:    Date Medicare IM Given:    Medicare IM give by:    Date Additional Medicare IM Given:    Additional Medicare Important Message give by:     If discussed at Long Length of Stay Meetings, dates discussed:  11-19-15   Additional Comments: UR updated  Kingsley Plan, RN 11/19/2015, 1:43 PM

## 2015-11-19 NOTE — Progress Notes (Signed)
Verbally understood DC instructions, no questions asked. 

## 2015-11-19 NOTE — Discharge Summary (Signed)
Discharge Summary  Brent Costa PFX:[id number] DOB: [birth year]  PCP: Angelica Chessman, MD  Admit date: 11/13/2015 Discharge date: 11/19/2015  Time spent: 25 minutes  Recommendations for Outpatient Follow-up:  1. New medication: Reglan 5 mg by mouth 3 times a day with meals 2. Medication change: Patient. He said on Keflex for abdominal pancreatitis, now discontinued 3. Patient will [facility] in the next one month   Discharge Diagnoses:  Active Hospital Problems   Diagnosis Date Noted  . Chronic pancreatitis ([place]) 11/13/2015  . Obesity (BMI 30-39.9) 11/15/2015  . 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 Abdominal pain 11/09/2015  . Tobacco use 11/09/2015  . Uncontrolled diabetes mellitus without complication ([place]) 32/99/2426  . Hypertension 05/23/2015    Resolved Hospital Problems   Diagnosis Date Noted Date Resolved  No resolved problems to display.    Discharge Condition: Improved, being discharged home   Diet recommendation: Carb modified low sodium   Filed Weights   11/13/15 2041  Weight: 102 kg (224 lb 13.9 oz)    History of present illness:  25 yr old male with past medical history of poorly controlled diabetes mellitus who was first hospitalized for pancreatitis in January (was felt to be secondary to hypertriglyceridemia and possibly some alcohol contribution) & subsequent admissions (x2) for recurrent pancreatitis. During most recent hospitalization a few days ago, CT scan noted new finding of a pancreatic pseudocyst.   Hospital Course:  Principal Problem: Gastroparesis: After patient was treated conservatively for pancreatitis with no improvement, given his poorly controlled blood sugars, question of the symptoms were more related to gastroparesis, especially descriptions of abdominal pain throughout abdomen rather than focal midepigastric. He was started on IV Reglan and diet was advanced. By 2/14, patient was able  tolerate solid food after getting Reglan with much improvement. He's given a prescription for Reglan will follow-up with his PCP Active Problems:   Hypertension: Stable. Now the pancreatitis ruled out as cause for this hospitalization, lisinopril can be resumed   Uncontrolled diabetes mellitus without complication [facility]): Discussed with the patient that the better control his blood sugars are, left proximal to have gastroparesis, he understands this and will work harder.    Abdominal pain   Tobacco use: Patient counseled   Pancreatic pseudocyst: Patient has had a number of hospitalizations for pancreatitis in the last few months. Initially, he was diagnosed pancreatitis earlier this year felt to be secondary to market hypertriglyceridemia and started on TriCor. He responded well to the TriCor and lipids this time in the low 200s versus several thousand 2 months ago. I think since then, he has not had pancreatitis but rather gastroparesis. We attempted to treat him as a chronic pancreatitis although this is all new finding with nothing by mouth and IV fluids. Lipase levels remain normal with little improvement. We treated him for gastroparesis with IV Reglan, he responded. We did not do a gastric emptying study. If patient does have another pancreatitis episode in the future, would take a close look at his TriCor and lisinopril, medications that can cause pancreatitis.    Obesity (BMI 30-39.9): Patient meets criteria with BMI greater than 30   Procedures:  None   Consultations:  None   Discharge Exam: BP 141/67 mmHg  Pulse 90  Temp(Src) 98.6 F (37 C) (Oral)  Resp 16  Ht 5' 9" (1.753 m)  Wt 102 kg (224 lb 13.9 oz)  BMI 33.19 kg/m2  SpO2 100%  General: Alert and oriented 3,  no acute distress  Cardiovascular: Regular rate and rhythm, S1-S2  Respiratory: Clear to auscultation bilaterally   Discharge Instructions You were cared for by a hospitalist during your hospital stay. If you have  any questions about your discharge medications or the care you received while you were in the hospital after you are discharged, you can call the unit and asked to speak with the hospitalist on call if the hospitalist that took care of you is not available. Once you are discharged, your primary care physician will handle any further medical issues. Please note that NO REFILLS for any discharge medications will be authorized once you are discharged, as it is imperative that you return to your primary care physician (or establish a relationship with a primary care physician if you do not have one) for your aftercare needs so that they can reassess your need for medications and monitor your lab values.  Discharge Instructions    Diet - low sodium heart healthy    Complete by:  As directed      Increase activity slowly    Complete by:  As directed             Medication List    STOP taking these medications        cephALEXin 500 MG capsule  Commonly known as:  KEFLEX      TAKE these medications        ACCU-CHEK AVIVA PLUS w/Device Kit  Check blood sugars 4 times daily (before meals and at bedtime)     accu-chek softclix lancets  Use as instructed     acetaminophen 325 MG tablet  Commonly known as:  TYLENOL  Take 2 tablets (650 mg total) by mouth every 6 (six) hours as needed for mild pain (or Fever >/= 101).     fenofibrate 145 MG tablet  Commonly known as:  TRICOR  Take 1 tablet (145 mg total) by mouth daily.     glucose blood test strip  Commonly known as:  ACCU-CHEK AVIVA  Use as instructed     insulin aspart 100 UNIT/ML injection  Commonly known as:  novoLOG  Inject 3-10 Units into the skin 3 (three) times daily. Blood Glucose 150 - 200, give 3 units Blood Glucose 201 - 250, give 5 units Blood Glucose 251 - 300, give 7 units Blood Glucose 301 - 350, give 10 units     Insulin Glargine 100 UNIT/ML Solostar Pen  Commonly known as:  LANTUS  Inject 45 Units into the skin daily  at 10 pm.     lisinopril 10 MG tablet  Commonly known as:  PRINIVIL,ZESTRIL  Take 1 tablet (10 mg total) by mouth daily.     metFORMIN 500 MG tablet  Commonly known as:  GLUCOPHAGE  Take 2 tablets (1,000 mg total) by mouth 2 (two) times daily with a meal.     metoCLOPramide 5 MG tablet  Commonly known as:  REGLAN  Take 1 tablet (5 mg total) by mouth 3 (three) times daily before meals.     omeprazole 40 MG capsule  Commonly known as:  PRILOSEC  Take 1 capsule (40 mg total) by mouth daily.     promethazine 25 MG tablet  Commonly known as:  PHENERGAN  Take 1 tablet (25 mg total) by mouth every 8 (eight) hours as needed for nausea or vomiting.       No Known Allergies     Follow-up Information    Follow up with JEGEDE,  [name], MD In 1 month.   Specialty:  Internal Medicine   Contact information:   [place] [address] [phone]        The results of significant diagnostics from this hospitalization (including imaging, microbiology, ancillary and laboratory) are listed below for reference.    Significant Diagnostic Studies: Ct Abdomen Pelvis W Contrast  11/06/2015  CLINICAL DATA:  Left upper quadrant pain with epigastric pain and nausea for about a week now. Previous history of pancreatitis. EXAM: CT ABDOMEN AND PELVIS WITH CONTRAST TECHNIQUE: Multidetector CT imaging of the abdomen and pelvis was performed using the standard protocol following bolus administration of intravenous contrast. CONTRAST:  132m OMNIPAQUE IOHEXOL 300 MG/ML  SOLN COMPARISON:  10/21/2015 FINDINGS: Lower chest: Trace atelectasis right lung base. Gynecomastia noted bilaterally. Hepatobiliary: No focal abnormality within the liver parenchyma. There is no evidence for gallstones, gallbladder wall thickening, or pericholecystic fluid. No intrahepatic or extrahepatic biliary dilation. Pancreas: As on prior studies, there is peripancreatic edema and inflammation. Features have  progressed in the interval and the phlegmonous change seen on the previous study has become more organized with an evolving 5.8 x 7.8 x 11.1 cm complex fluid collection anterior to the pancreatic head showing areas of rim enhancement. 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 There is some hypoattenuation in the uncinate process of the pancreas likely related to edema/ inflammation. No dilatation of the main pancreatic duct. Pancreatic parenchyma continues to enhance diffusely. Edema/ inflammation tracks in the anterior para renal space of the retroperitoneal abdomen. Spleen: No focal mass lesion. No dilatation of the main duct. No intraparenchymal cyst. No splenomegaly. No focal mass lesion. Adrenals/Urinary Tract: No adrenal nodule or mass. Kidneys are unremarkable. No evidence for hydroureter. The urinary bladder appears normal for the degree of distention. Stomach/Bowel: Mild thickening in the wall of the gastric antrum is assisted with duodenal wall thickening, changes likely secondary to the retroperitoneal edema/ inflammation. No evidence for gastric outlet obstruction at this time. No small bowel wall thickening. No small bowel dilatation. The terminal ileum is normal. High density material in the appendiceal lumen is new since 10/16/2015 and compatible with oral contrast material from previous CT scan. There is some thickening in the wall of the hepatic flexure, adjacent to the peripancreatic inflammatory process. Left colon is normal in appearance. Vascular/Lymphatic: No abdominal aortic aneurysm. No abdominal atherosclerotic calcification. Portal vein and superior mesenteric vein are patent. Splenic vein remains patent. No evidence for celiac axis or SMA pseudoaneurysm. Persistent left-sided IVC noted. Borderline enlarged lymph nodes are seen in the root of the small bowel mesentery and in the peripancreatic tissues, compatible with the inflammatory process described above. No  pelvic sidewall lymphadenopathy. Reproductive: The prostate gland and seminal vesicles have normal imaging features. Other: Trace intraperitoneal free fluid noted. Musculoskeletal: Bone windows reveal no worrisome lytic or sclerotic osseous lesions. IMPRESSION: Interval evolution of the peripancreatic edema/ inflammation with evolving pseudocyst now evident anterior to the head of the pancreas. No evidence for pancreatic necrosis at this time although there is some hypo attenuation in the uncinate process of the pancreas, likely related to edema/inflammation. No evidence for vascular complication in the upper abdomen. Electronically Signed   By: EMisty StanleyM.D.   On: 11/06/2015 15:37   Ct Abdomen Pelvis W Contrast  10/21/2015  CLINICAL DATA:  Followup exam for pancreatitis. Patient with continued abdominal pain and nausea. EXAM: CT ABDOMEN AND PELVIS WITH CONTRAST TECHNIQUE: Multidetector CT imaging of the abdomen and  pelvis was performed using the standard protocol following bolus administration of intravenous contrast. CONTRAST:  133m OMNIPAQUE IOHEXOL 300 MG/ML  SOLN COMPARISON:  Abdomen and pelvis CTs, 10/16/2015 and 11/18/2013. FINDINGS: Lung bases: Increased patchy and linear opacity at the right lung base when compared the prior study consistent with increased atelectasis. Heart normal in size. Pancreas: Extensive peripancreatic inflammatory change and fluid extends along the small bowel mesentery and involves the omentum, courses along the gastrohepatic ligament and along the anterior para renal fascia on the right. Inflammatory changes have worsened when compared to the prior CT. Small amount of ascites collects in the pelvis. Pancreas shows diffuse enhancement. The superior mesenteric vein, portal vein and splenic vein remain patent. Liver:  Mildly enlarged.  No liver mass or focal lesion. Spleen:  Unremarkable. Gallbladder and biliary tree: There is some increased attenuation in the gallbladder  without a discrete shadowing stone. No bile duct dilation. Adrenal glands:  Unremarkable. Kidneys, ureters, bladder:  Unremarkable. Lymph nodes:  No adenopathy. Gastrointestinal: Inflammatory changes surround the distal stomach and duodenum. Stomach and duodenum otherwise unremarkable. Normal small bowel. Inflammatory changes also contacts the right colon and transverse colon. Colon otherwise unremarkable. Normal appendix visualized. Musculoskeletal:  Unremarkable. IMPRESSION: 1. Worsened pancreatic inflammation when compared to the prior study. There is greater peripancreatic fluid and inflammatory change. A small amount of ascites has developed since the prior study. 2. No evidence of pancreatic necrosis. No evidence of focal collection to suggest an abscess or pseudocyst. No evidence of venous thrombosis. 3. Increased right lung base atelectasis. Persistent, mild hepatomegaly. Electronically Signed   By: DLajean ManesM.D.   On: 10/21/2015 13:37   Dg Chest Port 1 View  11/10/2015  CLINICAL DATA:  Fever EXAM: PORTABLE CHEST 1 VIEW COMPARISON:  10/18/2015 FINDINGS: The heart size and mediastinal contours are within normal limits. Both lungs are clear. The visualized skeletal structures are unremarkable. IMPRESSION: No active disease. Electronically Signed   By: CFranchot GalloM.D.   On: 11/10/2015 10:17    Microbiology: Recent Results (from the past 240 hour(s))  Culture, blood (Routine X 2) w Reflex to ID Panel     Status: None   Collection Time: 11/10/15  6:05 AM  Result Value Ref Range Status   Specimen Description BLOOD RIGHT ANTECUBITAL  Final   Special Requests BOTTLES DRAWN AEROBIC ONLY 5CCS  Final   Culture NO GROWTH 5 DAYS  Final   Report Status 11/15/2015 FINAL  Final  Culture, blood (Routine X 2) w Reflex to ID Panel     Status: None   Collection Time: 11/10/15  6:16 AM  Result Value Ref Range Status   Specimen Description BLOOD RIGHT HAND  Final   Special Requests AEROCOCCUS SPECIES  6CCS  Final   Culture NO GROWTH 5 DAYS  Final   Report Status 11/15/2015 FINAL  Final  Urine culture     Status: None   Collection Time: 11/10/15  6:42 PM  Result Value Ref Range Status   Specimen Description URINE, CLEAN CATCH  Final   Special Requests NONE  Final   Culture NO GROWTH 1 DAY  Final   Report Status 11/11/2015 FINAL  Final  Urine culture     Status: None   Collection Time: 11/13/15 10:24 PM  Result Value Ref Range Status   Specimen Description URINE, CLEAN CATCH  Final   Special Requests PATIENT ON FOLLOWING Keflex  Final   Culture NO GROWTH 2 DAYS  Final   Report Status  11/15/2015 FINAL  Final     Labs: Basic Metabolic Panel:  Recent Labs Lab 11/12/15 2158 11/13/15 1400  NA 137 138  K 3.5 3.8  CL 96* 95*  CO2 22 25  GLUCOSE 214* 231*  BUN 9 12  CREATININE 1.21 1.17  CALCIUM 9.7 10.2   Liver Function Tests:  Recent Labs Lab 11/12/15 2158 11/13/15 1400  AST 17 18  ALT 22 21  ALKPHOS 115 112  BILITOT 1.1 1.0  PROT 8.1 8.1  ALBUMIN 3.3* 3.4*    Recent Labs Lab 11/12/15 2158 11/13/15 1400  LIPASE 30 32   No results for input(s): AMMONIA in the last 168 hours. CBC:  Recent Labs Lab 11/12/15 2158 11/13/15 1400  WBC 7.4 7.2  HGB 12.6* 12.9*  HCT 37.2* 37.9*  MCV 79.1 79.0  PLT 349 392   Cardiac Enzymes: No results for input(s): CKTOTAL, CKMB, CKMBINDEX, TROPONINI in the last 168 hours. BNP: BNP (last 3 results) No results for input(s): BNP in the last 8760 hours.  ProBNP (last 3 results) No results for input(s): PROBNP in the last 8760 hours.  CBG:  Recent Labs Lab 11/18/15 1952 11/18/15 2344 11/19/15 0342 11/19/15 0756 11/19/15 1200  GLUCAP 116* 104* 100* 81 88       Signed:  KRISHNAN,SENDIL K  [facility] 11/19/2015, 2:38 PM

## 2015-11-20 MED FILL — METOCLOPRAMIDE 10 MG TABLET: 10 | 30 days supply | Qty: 45 | Fill #0

## 2015-11-26 ENCOUNTER — Encounter (HOSPITAL_COMMUNITY): Payer: Self-pay | Admitting: Adult Health

## 2015-11-26 DIAGNOSIS — N179 Acute kidney failure, unspecified: Secondary | ICD-10-CM | POA: Diagnosis present

## 2015-11-26 DIAGNOSIS — E876 Hypokalemia: Secondary | ICD-10-CM | POA: Diagnosis present

## 2015-11-26 DIAGNOSIS — K3184 Gastroparesis: Secondary | ICD-10-CM | POA: Diagnosis present

## 2015-11-26 DIAGNOSIS — D649 Anemia, unspecified: Secondary | ICD-10-CM | POA: Diagnosis present

## 2015-11-26 DIAGNOSIS — E781 Pure hyperglyceridemia: Secondary | ICD-10-CM | POA: Diagnosis present

## 2015-11-26 DIAGNOSIS — Z9119 Patient's noncompliance with other medical treatment and regimen: Secondary | ICD-10-CM

## 2015-11-26 DIAGNOSIS — K861 Other chronic pancreatitis: Secondary | ICD-10-CM | POA: Diagnosis present

## 2015-11-26 DIAGNOSIS — Z79899 Other long term (current) drug therapy: Secondary | ICD-10-CM

## 2015-11-26 DIAGNOSIS — Z6833 Body mass index (BMI) 33.0-33.9, adult: Secondary | ICD-10-CM

## 2015-11-26 DIAGNOSIS — K863 Pseudocyst of pancreas: Secondary | ICD-10-CM | POA: Diagnosis present

## 2015-11-26 DIAGNOSIS — Z833 Family history of diabetes mellitus: Secondary | ICD-10-CM

## 2015-11-26 DIAGNOSIS — Z794 Long term (current) use of insulin: Secondary | ICD-10-CM

## 2015-11-26 DIAGNOSIS — K859 Acute pancreatitis without necrosis or infection, unspecified: Principal | ICD-10-CM | POA: Diagnosis present

## 2015-11-26 DIAGNOSIS — I10 Essential (primary) hypertension: Secondary | ICD-10-CM | POA: Diagnosis present

## 2015-11-26 DIAGNOSIS — E860 Dehydration: Secondary | ICD-10-CM | POA: Diagnosis present

## 2015-11-26 DIAGNOSIS — Z87891 Personal history of nicotine dependence: Secondary | ICD-10-CM

## 2015-11-26 DIAGNOSIS — E1043 Type 1 diabetes mellitus with diabetic autonomic (poly)neuropathy: Secondary | ICD-10-CM | POA: Diagnosis present

## 2015-11-26 DIAGNOSIS — K920 Hematemesis: Secondary | ICD-10-CM | POA: Diagnosis present

## 2015-11-26 DIAGNOSIS — R21 Rash and other nonspecific skin eruption: Secondary | ICD-10-CM | POA: Diagnosis present

## 2015-11-26 DIAGNOSIS — E669 Obesity, unspecified: Secondary | ICD-10-CM | POA: Diagnosis present

## 2015-11-26 DIAGNOSIS — E1010 Type 1 diabetes mellitus with ketoacidosis without coma: Secondary | ICD-10-CM | POA: Diagnosis present

## 2015-11-26 DIAGNOSIS — E871 Hypo-osmolality and hyponatremia: Secondary | ICD-10-CM | POA: Diagnosis present

## 2015-11-26 MED ORDER — ONDANSETRON HCL 4 MG/2ML IJ SOLN
4.0000 mg | Freq: Once | INTRAMUSCULAR | Status: AC | PRN
  Administered 2015-11-27: 4 mg via INTRAVENOUS
  Filled 2015-11-26: qty 2

## 2015-11-26 MED ORDER — ONDANSETRON HCL 4 MG/2ML IJ SOLN
INTRAMUSCULAR | Status: AC
  Administered 2015-11-27: 4 mg
  Filled 2015-11-26: qty 2

## 2015-11-26 MED ORDER — FENTANYL CITRATE (PF) 100 MCG/2ML IJ SOLN
50.0000 ug | Freq: Once | INTRAMUSCULAR | Status: AC
  Administered 2015-11-27: 50 ug via INTRAVENOUS

## 2015-11-26 MED ORDER — FENTANYL CITRATE (PF) 100 MCG/2ML IJ SOLN
INTRAMUSCULAR | Status: AC
  Filled 2015-11-26: qty 2

## 2015-11-26 NOTE — ED Notes (Addendum)
Presents with upper abdominal pain and back pain associated with nausea and vomiting began since he was admitted on 2/8 and was discharged o nthe 14. Pt reports it has not ever gotten better.  Endorses chest pain, vomiitng, chills, inability to hold fluids down.  Tearful. CBG 252-he reports it is an everyday problem and nothing makes it better.  Dry heaving at triage.

## 2015-11-27 ENCOUNTER — Inpatient Hospital Stay (HOSPITAL_COMMUNITY)
Admission: EM | Admit: 2015-11-27 | Discharge: 2015-12-02 | DRG: 438 | Disposition: A | Discharge status: Home / Self Care | Payer: Self-pay | Attending: Internal Medicine | Admitting: Internal Medicine

## 2015-11-27 ENCOUNTER — Inpatient Hospital Stay (HOSPITAL_COMMUNITY): Payer: Self-pay

## 2015-11-27 ENCOUNTER — Encounter (HOSPITAL_COMMUNITY): Payer: Self-pay | Admitting: Radiology

## 2015-11-27 DIAGNOSIS — N179 Acute kidney failure, unspecified: Secondary | ICD-10-CM

## 2015-11-27 DIAGNOSIS — K858 Other acute pancreatitis without necrosis or infection: Secondary | ICD-10-CM | POA: Insufficient documentation

## 2015-11-27 DIAGNOSIS — R935 Abnormal findings on diagnostic imaging of other abdominal regions, including retroperitoneum: Secondary | ICD-10-CM | POA: Insufficient documentation

## 2015-11-27 DIAGNOSIS — R112 Nausea with vomiting, unspecified: Secondary | ICD-10-CM

## 2015-11-27 DIAGNOSIS — E119 Type 2 diabetes mellitus without complications: Secondary | ICD-10-CM

## 2015-11-27 DIAGNOSIS — K3184 Gastroparesis: Secondary | ICD-10-CM | POA: Insufficient documentation

## 2015-11-27 DIAGNOSIS — K863 Pseudocyst of pancreas: Secondary | ICD-10-CM | POA: Insufficient documentation

## 2015-11-27 DIAGNOSIS — IMO0001 Reserved for inherently not codable concepts without codable children: Secondary | ICD-10-CM | POA: Diagnosis present

## 2015-11-27 DIAGNOSIS — E1110 Type 2 diabetes mellitus with ketoacidosis without coma: Secondary | ICD-10-CM

## 2015-11-27 DIAGNOSIS — R21 Rash and other nonspecific skin eruption: Secondary | ICD-10-CM

## 2015-11-27 DIAGNOSIS — E876 Hypokalemia: Secondary | ICD-10-CM

## 2015-11-27 DIAGNOSIS — E860 Dehydration: Secondary | ICD-10-CM

## 2015-11-27 DIAGNOSIS — R9431 Abnormal electrocardiogram [ECG] [EKG]: Secondary | ICD-10-CM

## 2015-11-27 DIAGNOSIS — E1165 Type 2 diabetes mellitus with hyperglycemia: Secondary | ICD-10-CM

## 2015-11-27 DIAGNOSIS — I10 Essential (primary) hypertension: Secondary | ICD-10-CM | POA: Diagnosis present

## 2015-11-27 HISTORY — DX: Type 2 diabetes mellitus without complications: E11.9

## 2015-11-27 HISTORY — DX: Gastro-esophageal reflux disease without esophagitis: K21.9

## 2015-11-27 HISTORY — DX: Other chronic pancreatitis: K86.1

## 2015-11-27 LAB — CBC
HEMATOCRIT: 33.6 % — AB (ref 39.0–52.0)
HEMATOCRIT: 41.1 % (ref 39.0–52.0)
HEMOGLOBIN: 10.9 g/dL — AB (ref 13.0–17.0)
Hemoglobin: 13.6 g/dL (ref 13.0–17.0)
MCH: 26.7 pg (ref 26.0–34.0)
MCH: 26.7 pg (ref 26.0–34.0)
MCHC: 32.4 g/dL (ref 30.0–36.0)
MCHC: 33.1 g/dL (ref 30.0–36.0)
MCV: 80.6 fL (ref 78.0–100.0)
MCV: 82.4 fL (ref 78.0–100.0)
PLATELETS: 503 10*3/uL — AB (ref 150–400)
Platelets: 332 10*3/uL (ref 150–400)
RBC: 4.08 MIL/uL — ABNORMAL LOW (ref 4.22–5.81)
RBC: 5.1 MIL/uL (ref 4.22–5.81)
RDW: 13.7 % (ref 11.5–15.5)
RDW: 14 % (ref 11.5–15.5)
WBC: 6.3 10*3/uL (ref 4.0–10.5)
WBC: 9.9 10*3/uL (ref 4.0–10.5)

## 2015-11-27 LAB — URINALYSIS, ROUTINE W REFLEX MICROSCOPIC
Bilirubin Urine: NEGATIVE
Glucose, UA: NEGATIVE mg/dL
KETONES UR: NEGATIVE mg/dL
LEUKOCYTES UA: NEGATIVE
NITRITE: NEGATIVE
PH: 7 (ref 5.0–8.0)
PROTEIN: NEGATIVE mg/dL
Specific Gravity, Urine: 1.039 — ABNORMAL HIGH (ref 1.005–1.030)

## 2015-11-27 LAB — BASIC METABOLIC PANEL
Anion gap: 13 (ref 5–15)
Anion gap: 14 (ref 5–15)
Anion gap: 16 — ABNORMAL HIGH (ref 5–15)
Anion gap: 13 (ref 5–15)
BUN: 17 mg/dL (ref 6–20)
BUN: 15 mg/dL (ref 6–20)
BUN: 13 mg/dL (ref 6–20)
BUN: 10 mg/dL (ref 6–20)
CALCIUM: 8.3 mg/dL — AB (ref 8.9–10.3)
CHLORIDE: 88 mmol/L — AB (ref 101–111)
CHLORIDE: 91 mmol/L — AB (ref 101–111)
CHLORIDE: 93 mmol/L — AB (ref 101–111)
CO2: 36 mmol/L — ABNORMAL HIGH (ref 22–32)
CO2: 32 mmol/L (ref 22–32)
CO2: 27 mmol/L (ref 22–32)
CO2: 33 mmol/L — AB (ref 22–32)
CREATININE: 1.82 mg/dL — AB (ref 0.61–1.24)
CREATININE: 1.53 mg/dL — AB (ref 0.61–1.24)
CREATININE: 1.38 mg/dL — AB (ref 0.61–1.24)
Calcium: 8.3 mg/dL — ABNORMAL LOW (ref 8.9–10.3)
Calcium: 8.3 mg/dL — ABNORMAL LOW (ref 8.9–10.3)
Calcium: 8.1 mg/dL — ABNORMAL LOW (ref 8.9–10.3)
Chloride: 93 mmol/L — ABNORMAL LOW (ref 101–111)
Creatinine, Ser: 1.32 mg/dL — ABNORMAL HIGH (ref 0.61–1.24)
GFR calc Af Amer: 59 mL/min — ABNORMAL LOW (ref >60)
GFR calc Af Amer: >60 mL/min (ref >60)
GFR calc Af Amer: >60 mL/min (ref >60)
GFR calc Af Amer: >60 mL/min (ref >60)
GFR calc non Af Amer: 51 mL/min — ABNORMAL LOW (ref >60)
GFR calc non Af Amer: >60 mL/min (ref >60)
GFR calc non Af Amer: >60 mL/min (ref >60)
GLUCOSE: 280 mg/dL — AB (ref 65–99)
GLUCOSE: 140 mg/dL — AB (ref 65–99)
GLUCOSE: 149 mg/dL — AB (ref 65–99)
GLUCOSE: 142 mg/dL — AB (ref 65–99)
POTASSIUM: 3.3 mmol/L — AB (ref 3.5–5.1)
POTASSIUM: 4 mmol/L (ref 3.5–5.1)
POTASSIUM: 3.3 mmol/L — AB (ref 3.5–5.1)
Potassium: 2.8 mmol/L — ABNORMAL LOW (ref 3.5–5.1)
SODIUM: 137 mmol/L (ref 135–145)
SODIUM: 137 mmol/L (ref 135–145)
SODIUM: 136 mmol/L (ref 135–145)
Sodium: 139 mmol/L (ref 135–145)

## 2015-11-27 LAB — I-STAT ARTERIAL BLOOD GAS, ED
ACID-BASE EXCESS: 13 mmol/L — AB (ref 0.0–2.0)
Bicarbonate: 38.2 mEq/L — ABNORMAL HIGH (ref 20.0–24.0)
O2 SAT: 93 %
PCO2 ART: 51.6 mmHg — AB (ref 35.0–45.0)
PH ART: 7.477 — AB (ref 7.350–7.450)
PO2 ART: 65 mmHg — AB (ref 80.0–100.0)
Patient temperature: 98.5
TCO2: 40 mmol/L (ref 0–100)

## 2015-11-27 LAB — COMPREHENSIVE METABOLIC PANEL
ALT: 10 U/L — ABNORMAL LOW (ref 17–63)
AST: 13 U/L — AB (ref 15–41)
Albumin: 3.7 g/dL (ref 3.5–5.0)
Alkaline Phosphatase: 105 U/L (ref 38–126)
Anion gap: 23 — ABNORMAL HIGH (ref 5–15)
BILIRUBIN TOTAL: 1 mg/dL (ref 0.3–1.2)
BUN: 19 mg/dL (ref 6–20)
CO2: 33 mmol/L — ABNORMAL HIGH (ref 22–32)
Calcium: 9.9 mg/dL (ref 8.9–10.3)
Chloride: 79 mmol/L — ABNORMAL LOW (ref 101–111)
Creatinine, Ser: 2.25 mg/dL — ABNORMAL HIGH (ref 0.61–1.24)
GFR, EST AFRICAN AMERICAN: 46 mL/min — AB (ref >60)
GFR, EST NON AFRICAN AMERICAN: 39 mL/min — AB (ref >60)
Glucose, Bld: 249 mg/dL — ABNORMAL HIGH (ref 65–99)
POTASSIUM: 2.8 mmol/L — AB (ref 3.5–5.1)
Sodium: 135 mmol/L (ref 135–145)
TOTAL PROTEIN: 8.5 g/dL — AB (ref 6.5–8.1)

## 2015-11-27 LAB — VOLATILES,BLD-ACETONE,ETHANOL,ISOPROP,METHANOL
Acetone, blood: NEGATIVE % (ref 0.000–0.010)
Ethanol, blood: NEGATIVE % (ref 0.000–0.010)
ISOPROPANOL, BLOOD: NEGATIVE % (ref 0.000–0.010)
METHANOL, BLOOD: NEGATIVE % (ref 0.000–0.010)

## 2015-11-27 LAB — URINE MICROSCOPIC-ADD ON

## 2015-11-27 LAB — LIPASE, BLOOD
LIPASE: 30 U/L (ref 11–51)
Lipase: 35 U/L (ref 11–51)

## 2015-11-27 LAB — CBG MONITORING, ED
GLUCOSE-CAPILLARY: 134 mg/dL — AB (ref 65–99)
GLUCOSE-CAPILLARY: 138 mg/dL — AB (ref 65–99)
GLUCOSE-CAPILLARY: 149 mg/dL — AB (ref 65–99)
GLUCOSE-CAPILLARY: 151 mg/dL — AB (ref 65–99)
GLUCOSE-CAPILLARY: 166 mg/dL — AB (ref 65–99)
GLUCOSE-CAPILLARY: 207 mg/dL — AB (ref 65–99)
GLUCOSE-CAPILLARY: 249 mg/dL — AB (ref 65–99)
GLUCOSE-CAPILLARY: 252 mg/dL — AB (ref 65–99)
Glucose-Capillary: 253 mg/dL — ABNORMAL HIGH (ref 65–99)
Glucose-Capillary: 210 mg/dL — ABNORMAL HIGH (ref 65–99)
Glucose-Capillary: 170 mg/dL — ABNORMAL HIGH (ref 65–99)

## 2015-11-27 LAB — LIPID PANEL
CHOL/HDL RATIO: 8.8 ratio
Cholesterol: 132 mg/dL (ref 0–200)
HDL: 15 mg/dL — AB (ref >40)
LDL Cholesterol: 72 mg/dL (ref 0–99)
TRIGLYCERIDES: 223 mg/dL — AB (ref <150)
VLDL: 45 mg/dL — ABNORMAL HIGH (ref 0–40)

## 2015-11-27 LAB — GLUCOSE, CAPILLARY: GLUCOSE-CAPILLARY: 167 mg/dL — AB (ref 65–99)

## 2015-11-27 LAB — PHOSPHORUS: PHOSPHORUS: 4.1 mg/dL (ref 2.5–4.6)

## 2015-11-27 LAB — MAGNESIUM: MAGNESIUM: 1.8 mg/dL (ref 1.7–2.4)

## 2015-11-27 MED ORDER — POTASSIUM CHLORIDE 10 MEQ/100ML IV SOLN
10.0000 meq | INTRAVENOUS | Status: AC
  Administered 2015-11-27 – 2015-11-28 (×4): 10 meq via INTRAVENOUS
  Filled 2015-11-27 (×4): qty 100

## 2015-11-27 MED ORDER — SODIUM CHLORIDE 0.9 % IV SOLN
INTRAVENOUS | Status: AC
  Administered 2015-11-27: 09:00:00 via INTRAVENOUS

## 2015-11-27 MED ORDER — FENOFIBRATE 54 MG PO TABS
54.0000 mg | ORAL_TABLET | Freq: Every day | ORAL | Status: DC
  Administered 2015-11-28 – 2015-12-02 (×5): 54 mg via ORAL
  Filled 2015-11-27 (×5): qty 1

## 2015-11-27 MED ORDER — SODIUM CHLORIDE 0.9 % IV SOLN
1000.0000 mL | INTRAVENOUS | Status: DC
  Administered 2015-11-27 – 2015-11-28 (×3): 1000 mL via INTRAVENOUS

## 2015-11-27 MED ORDER — PANTOPRAZOLE SODIUM 40 MG IV SOLR
40.0000 mg | Freq: Two times a day (BID) | INTRAVENOUS | Status: DC
  Administered 2015-11-27 – 2015-12-01 (×9): 40 mg via INTRAVENOUS
  Filled 2015-11-27 (×10): qty 40

## 2015-11-27 MED ORDER — OXYCODONE HCL 5 MG PO TABS
5.0000 mg | ORAL_TABLET | ORAL | Status: DC | PRN
  Administered 2015-11-27 – 2015-11-28 (×4): 5 mg via ORAL
  Filled 2015-11-27 (×4): qty 1

## 2015-11-27 MED ORDER — MAGNESIUM SULFATE 2 GM/50ML IV SOLN
2.0000 g | Freq: Once | INTRAVENOUS | Status: AC
  Administered 2015-11-27: 2 g via INTRAVENOUS
  Filled 2015-11-27: qty 50

## 2015-11-27 MED ORDER — ENOXAPARIN SODIUM 40 MG/0.4ML ~~LOC~~ SOLN
40.0000 mg | SUBCUTANEOUS | Status: DC
  Administered 2015-11-27 – 2015-11-29 (×3): 40 mg via SUBCUTANEOUS
  Filled 2015-11-27 (×3): qty 0.4

## 2015-11-27 MED ORDER — PROMETHAZINE HCL 25 MG PO TABS: 25.0000 mg | ORAL_TABLET | Freq: Three times a day (TID) | ORAL | Status: DC | PRN

## 2015-11-27 MED ORDER — CIPROFLOXACIN IN D5W 400 MG/200ML IV SOLN
400.0000 mg | Freq: Two times a day (BID) | INTRAVENOUS | Status: DC
  Administered 2015-11-27: 400 mg via INTRAVENOUS
  Filled 2015-11-27: qty 200

## 2015-11-27 MED ORDER — SODIUM CHLORIDE 0.9% FLUSH
3.0000 mL | Freq: Two times a day (BID) | INTRAVENOUS | Status: DC
  Administered 2015-11-27 – 2015-12-02 (×4): 3 mL via INTRAVENOUS

## 2015-11-27 MED ORDER — ACETAMINOPHEN 650 MG RE SUPP: 650.0000 mg | Freq: Four times a day (QID) | RECTAL | Status: DC | PRN

## 2015-11-27 MED ORDER — HYDROCORTISONE 1 % EX OINT
TOPICAL_OINTMENT | Freq: Two times a day (BID) | CUTANEOUS | Status: AC
  Administered 2015-11-27 (×2): via TOPICAL
  Administered 2015-11-28: 1 via TOPICAL
  Administered 2015-11-28 – 2015-11-29 (×2): via TOPICAL
  Administered 2015-11-29: 1 via TOPICAL
  Administered 2015-11-30 – 2015-12-01 (×4): via TOPICAL
  Filled 2015-11-27 (×2): qty 28.35

## 2015-11-27 MED ORDER — HYDRALAZINE HCL 20 MG/ML IJ SOLN
5.0000 mg | INTRAMUSCULAR | Status: DC | PRN
Start: 2015-11-27 — End: 2015-12-02

## 2015-11-27 MED ORDER — SODIUM CHLORIDE 0.9 % IV SOLN
500.0000 mg | Freq: Three times a day (TID) | INTRAVENOUS | Status: DC
  Administered 2015-11-27 – 2015-11-28 (×4): 500 mg via INTRAVENOUS
  Filled 2015-11-27 (×7): qty 500

## 2015-11-27 MED ORDER — IOHEXOL 300 MG/ML  SOLN
80.0000 mL | Freq: Once | INTRAMUSCULAR | Status: AC | PRN
  Administered 2015-11-27: 80 mL via INTRAVENOUS

## 2015-11-27 MED ORDER — SODIUM CHLORIDE 0.9 % IV SOLN
1000.0000 mL | Freq: Once | INTRAVENOUS | Status: AC
  Administered 2015-11-27: 1000 mL via INTRAVENOUS

## 2015-11-27 MED ORDER — ONDANSETRON HCL 4 MG/2ML IJ SOLN
4.0000 mg | Freq: Four times a day (QID) | INTRAMUSCULAR | Status: DC | PRN
Start: 2015-11-27 — End: 2015-11-27

## 2015-11-27 MED ORDER — METOCLOPRAMIDE HCL 5 MG/ML IJ SOLN
10.0000 mg | Freq: Three times a day (TID) | INTRAMUSCULAR | Status: DC
Start: 2015-11-27 — End: 2015-12-02
  Administered 2015-11-27 – 2015-12-02 (×15): 10 mg via INTRAVENOUS
  Filled 2015-11-27 (×15): qty 2

## 2015-11-27 MED ORDER — METOCLOPRAMIDE HCL 5 MG/ML IJ SOLN
10.0000 mg | Freq: Once | INTRAMUSCULAR | Status: AC
  Administered 2015-11-27: 10 mg via INTRAVENOUS
  Filled 2015-11-27: qty 2

## 2015-11-27 MED ORDER — ONDANSETRON HCL 4 MG/2ML IJ SOLN
4.0000 mg | Freq: Four times a day (QID) | INTRAMUSCULAR | Status: DC
  Administered 2015-11-27 – 2015-12-02 (×17): 4 mg via INTRAVENOUS
  Filled 2015-11-27 (×17): qty 2

## 2015-11-27 MED ORDER — POTASSIUM CHLORIDE 10 MEQ/100ML IV SOLN
10.0000 meq | INTRAVENOUS | Status: AC
  Administered 2015-11-27 (×4): 10 meq via INTRAVENOUS
  Filled 2015-11-27 (×4): qty 100

## 2015-11-27 MED ORDER — ONDANSETRON HCL 4 MG PO TABS: 4.0000 mg | ORAL_TABLET | Freq: Four times a day (QID) | ORAL | Status: DC | PRN

## 2015-11-27 MED ORDER — SODIUM CHLORIDE 0.9 % IV SOLN
INTRAVENOUS | Status: DC
  Administered 2015-11-27: 1.9 [IU]/h via INTRAVENOUS
  Filled 2015-11-27: qty 2.5

## 2015-11-27 MED ORDER — METRONIDAZOLE IN NACL 5-0.79 MG/ML-% IV SOLN: 500.0000 mg | Freq: Three times a day (TID) | INTRAVENOUS | Status: DC

## 2015-11-27 MED ORDER — ENSURE ENLIVE PO LIQD: 237.0000 mL | Freq: Two times a day (BID) | ORAL | Status: DC

## 2015-11-27 MED ORDER — POTASSIUM CHLORIDE CRYS ER 20 MEQ PO TBCR
40.0000 meq | EXTENDED_RELEASE_TABLET | Freq: Once | ORAL | Status: AC
  Administered 2015-11-27: 40 meq via ORAL
  Filled 2015-11-27: qty 2

## 2015-11-27 MED ORDER — ACETAMINOPHEN 325 MG PO TABS
650.0000 mg | ORAL_TABLET | Freq: Four times a day (QID) | ORAL | Status: DC | PRN
  Administered 2015-11-27: 650 mg via ORAL
  Filled 2015-11-27: qty 2

## 2015-11-27 MED ORDER — DEXTROSE-NACL 5-0.45 % IV SOLN
INTRAVENOUS | Status: DC
  Administered 2015-11-27 – 2015-11-28 (×3): via INTRAVENOUS

## 2015-11-27 MED ORDER — HYDROMORPHONE HCL 1 MG/ML IJ SOLN
1.0000 mg | Freq: Once | INTRAMUSCULAR | Status: AC
  Administered 2015-11-27: 1 mg via INTRAVENOUS
  Filled 2015-11-27: qty 1

## 2015-11-27 MED ORDER — POTASSIUM CHLORIDE 10 MEQ/100ML IV SOLN
10.0000 meq | INTRAVENOUS | Status: AC
  Administered 2015-11-27 (×2): 10 meq via INTRAVENOUS
  Filled 2015-11-27 (×2): qty 100

## 2015-11-27 MED ORDER — PANTOPRAZOLE SODIUM 40 MG PO TBEC: 80.0000 mg | DELAYED_RELEASE_TABLET | Freq: Every day | ORAL | Status: DC

## 2015-11-27 MED ORDER — PROMETHAZINE HCL 25 MG/ML IJ SOLN
12.5000 mg | Freq: Four times a day (QID) | INTRAMUSCULAR | Status: DC | PRN
  Administered 2015-12-01: 12.5 mg via INTRAVENOUS
  Filled 2015-11-27: qty 1

## 2015-11-27 MED ORDER — SODIUM CHLORIDE 0.9 % IV SOLN
INTRAVENOUS | Status: DC
  Administered 2015-11-27 (×2): via INTRAVENOUS

## 2015-11-27 NOTE — Consult Note (Signed)
Reason for Consult:  Possible microperforation of the stomach or duodenum, pancreatitis, and pancreatic pseudocyst Referring Physician: Dr. Linna Darner PVP: Angelica Chessman, MD  CC: nausea and vomiting, generalized weakness, chest pain with nausea and vomiting   Brent Costa is an 25 y.o. male.  HPI: Pt presents with abdominal and back pain, nausea and vomiting.  Chest pain, with vomiting and chills, he cannot even keep liquids down.  He was here 2/8-2/14/17 with chronic pancreatitis, thought to be secondary to high triglycerides, and some possible  ETOH use.  He has been here 1/11-1/21/17, 2/4-11/12/15, and again 2/7-2/14/17 with the same issues.  In addition to this he also has and is being treated for Gastroparesis with Reglan.  He has not had a gastric emptying study so far.  He says he has had abdominal pain most of the time since onset in January. He denies ETOH use since January and his Tox screen is negative for ETOH.  Work up in the ED today shows:  He is afebrile, VSS., creatinine is 1.82, better with hydration.  Triglycerides  223, glucose 280.  CT scan shows: 1. Multiple small loculations of gas along the second portion duodenum and extending into Morrison's pouch. Findings consistent with MICRO PERFORATION presumably of the stomach or duodenum and less likely hepatic flexure of the colon. Differential would include post pancreatitis perforation, necrotizing infection versus iatrogenic (query recent endoscopy).  Extensive inflammation along the second portion duodenum, head of the pancreas, and Morrison's pouch. Degree of inflammation is similar to comparison exam. The fluid collections are decreased in volume.   Inflammation along the hepatic flexure of the colon likely secondary to the prior pancreatitis.   He is tender all over his abdomen but doesn't have peritonitis.  We are ask to see.  Past Medical History  Diagnosis Date  DM type 1 (diabetes mellitus, type 1) ([place]) 11/2013    oral agents and insulin  Poor control      Pancreatitis/pancreatic pseudocyst 10/2015   likely present 11/2013 but no lipase at that time.  due to elevated triglycerides and ETOH  Obesity      Hepatic steatosis 11/2013   seen on CT, LFTs normal.     Hypertension   Chronic bronchitis ([place])   "get it about q year" (11/20/2013)  Hypertriglyceridemia   Microalbuminuria   Molluscum contagiosum 06/2015  upper extremities.    Tobacco use/ETOH use       Past Surgical History  Procedure Laterality Date  . Tonsillectomy and adenoidectomy  ~ 1999  . Esophagogastroduodenoscopy N/A 11/21/2013    Procedure: ESOPHAGOGASTRODUODENOSCOPY (EGD);  Surgeon: Wonda Horner, MD;  Location: [facility] ENDOSCOPY;  Service: Endoscopy;  Laterality: N/A;    Family History  Problem Relation Age of Onset  . Diabetes Mellitus II Father   . Heart failure Father   . Asthma Mother   . Diabetes Mellitus I Sister     Social History:  reports that he quit smoking about 1 years ago. His smoking use included Cigarettes. He smoked 0.00 packs per day for 3 years. He has never used smokeless tobacco. He reports that he drinks alcohol. He reports that he uses illicit drugs (Marijuana) about twice per week.  Allergies: No Known Allergies  Medications:  Prior to Admission:  (Not in a hospital admission) Scheduled: . enoxaparin (LOVENOX) injection  40 mg Subcutaneous Q24H  . fenofibrate  54 mg Oral Daily  . hydrocortisone   Topical BID  . metoCLOPramide (REGLAN) injection  10 mg  Intravenous 3 times per day  . ondansetron  4 mg Intravenous Q6H  . sodium chloride flush  3 mL Intravenous Q12H   Continuous: . sodium chloride Stopped (11/27/15 0915)  . sodium chloride 125 mL/hr at 11/27/15 0915  . ciprofloxacin    . dextrose 5 % and 0.45% NaCl 125 mL/hr at 11/27/15 1142  . insulin (NOVOLIN-R) infusion 0.9 Units/hr (11/27/15 1423)  . metronidazole     BPZ:WCHENIDPOEUMP **OR** acetaminophen, hydrALAZINE, oxyCODONE,  promethazine, promethazine Anti-infectives    Start     Dose/Rate Route Frequency Ordered Stop   11/27/15 1530  ciprofloxacin (CIPRO) IVPB 400 mg     400 mg 200 mL/hr over 60 Minutes Intravenous Every 12 hours 11/27/15 1519     11/27/15 1530  metroNIDAZOLE (FLAGYL) IVPB 500 mg     500 mg 100 mL/hr over 60 Minutes Intravenous Every 8 hours 11/27/15 1519        Results for orders placed or performed during the hospital encounter of 11/27/15 (from the past 48 hour(s))  CBG monitoring, ED     Status: Abnormal   Collection Time: 11/26/15 11:46 PM  Result Value Ref Range   Glucose-Capillary 252 (H) 65 - 99 mg/dL   Comment 1 Notify RN    Comment 2 Document in Chart   Lipase, blood     Status: None   Collection Time: 11/26/15 11:49 PM  Result Value Ref Range   Lipase 35 11 - 51 U/L  Comprehensive metabolic panel     Status: Abnormal   Collection Time: 11/26/15 11:49 PM  Result Value Ref Range   Sodium 135 135 - 145 mmol/L   Potassium 2.8 (L) 3.5 - 5.1 mmol/L   Chloride 79 (L) 101 - 111 mmol/L   CO2 33 (H) 22 - 32 mmol/L   Glucose, Bld 249 (H) 65 - 99 mg/dL   BUN 19 6 - 20 mg/dL   Creatinine, Ser 2.25 (H) 0.61 - 1.24 mg/dL   Calcium 9.9 8.9 - 10.3 mg/dL   Total Protein 8.5 (H) 6.5 - 8.1 g/dL   Albumin 3.7 3.5 - 5.0 g/dL   AST 13 (L) 15 - 41 U/L   ALT 10 (L) 17 - 63 U/L   Alkaline Phosphatase 105 38 - 126 U/L   Total Bilirubin 1.0 0.3 - 1.2 mg/dL   GFR calc non Af Amer 39 (L) >60 mL/min   GFR calc Af Amer 46 (L) >60 mL/min    Comment: (NOTE) The eGFR has been calculated using the CKD EPI equation. This calculation has not been validated in all clinical situations. eGFR's persistently <60 mL/min signify possible Chronic Kidney Disease.    Anion gap 23 (H) 5 - 15  CBC     Status: Abnormal   Collection Time: 11/26/15 11:49 PM  Result Value Ref Range   WBC 9.9 4.0 - 10.5 K/uL   RBC 5.10 4.22 - 5.81 MIL/uL   Hemoglobin 13.6 13.0 - 17.0 g/dL   HCT 41.1 39.0 - 52.0 %   MCV 80.6  78.0 - 100.0 fL   MCH 26.7 26.0 - 34.0 pg   MCHC 33.1 30.0 - 36.0 g/dL   RDW 13.7 11.5 - 15.5 %   Platelets 503 (H) 150 - 400 K/uL  CBG monitoring, ED     Status: Abnormal   Collection Time: 11/27/15  9:19 AM  Result Value Ref Range   Glucose-Capillary 249 (H) 65 - 99 mg/dL  Basic metabolic panel     Status: Abnormal  Collection Time: 11/27/15  9:37 AM  Result Value Ref Range   Sodium 137 135 - 145 mmol/L   Potassium 3.3 (L) 3.5 - 5.1 mmol/L   Chloride 88 (L) 101 - 111 mmol/L   CO2 36 (H) 22 - 32 mmol/L   Glucose, Bld 280 (H) 65 - 99 mg/dL   BUN 17 6 - 20 mg/dL   Creatinine, Ser 1.82 (H) 0.61 - 1.24 mg/dL   Calcium 8.3 (L) 8.9 - 10.3 mg/dL   GFR calc non Af Amer 51 (L) >60 mL/min   GFR calc Af Amer 59 (L) >60 mL/min    Comment: (NOTE) The eGFR has been calculated using the CKD EPI equation. This calculation has not been validated in all clinical situations. eGFR's persistently <60 mL/min signify possible Chronic Kidney Disease.    Anion gap 13 5 - 15  CBC     Status: Abnormal   Collection Time: 11/27/15  9:37 AM  Result Value Ref Range   WBC 6.3 4.0 - 10.5 K/uL   RBC 4.08 (L) 4.22 - 5.81 MIL/uL   Hemoglobin 10.9 (L) 13.0 - 17.0 g/dL   HCT 33.6 (L) 39.0 - 52.0 %   MCV 82.4 78.0 - 100.0 fL   MCH 26.7 26.0 - 34.0 pg   MCHC 32.4 30.0 - 36.0 g/dL   RDW 14.0 11.5 - 15.5 %   Platelets 332 150 - 400 K/uL  Magnesium     Status: None   Collection Time: 11/27/15  9:37 AM  Result Value Ref Range   Magnesium 1.8 1.7 - 2.4 mg/dL  Phosphorus     Status: None   Collection Time: 11/27/15  9:37 AM  Result Value Ref Range   Phosphorus 4.1 2.5 - 4.6 mg/dL  Lipid panel     Status: Abnormal   Collection Time: 11/27/15  9:37 AM  Result Value Ref Range   Cholesterol 132 0 - 200 mg/dL   Triglycerides 223 (H) <150 mg/dL   HDL 15 (L) >40 mg/dL   Total CHOL/HDL Ratio 8.8 RATIO   VLDL 45 (H) 0 - 40 mg/dL   LDL Cholesterol 72 0 - 99 mg/dL    Comment:        Total Cholesterol/HDL:CHD  Risk Coronary Heart Disease Risk Table                     Men   Women  1/2 Average Risk   3.4   3.3  Average Risk       5.0   4.4  2 X Average Risk   9.6   7.1  3 X Average Risk  23.4   11.0        Use the calculated Patient Ratio above and the CHD Risk Table to determine the patient's CHD Risk.        ATP III CLASSIFICATION (LDL):  <100     mg/dL   Optimal  100-129  mg/dL   Near or Above                    Optimal  130-159  mg/dL   Borderline  160-189  mg/dL   High  >190     mg/dL   Very High   I-Stat arterial blood gas, ED     Status: Abnormal   Collection Time: 11/27/15  9:45 AM  Result Value Ref Range   pH, Arterial 7.477 (H) 7.350 - 7.450   pCO2 arterial 51.6 (H) 35.0 -  45.0 mmHg   pO2, Arterial 65.0 (L) 80.0 - 100.0 mmHg   Bicarbonate 38.2 (H) 20.0 - 24.0 mEq/L   TCO2 40 0 - 100 mmol/L   O2 Saturation 93.0 %   Acid-Base Excess 13.0 (H) 0.0 - 2.0 mmol/L   Patient temperature 98.5 F    Collection site RADIAL, ALLEN'S TEST ACCEPTABLE    Drawn by Operator    Sample type ARTERIAL   CBG monitoring, ED     Status: Abnormal   Collection Time: 11/27/15 10:06 AM  Result Value Ref Range   Glucose-Capillary 253 (H) 65 - 99 mg/dL  CBG monitoring, ED     Status: Abnormal   Collection Time: 11/27/15 11:32 AM  Result Value Ref Range   Glucose-Capillary 210 (H) 65 - 99 mg/dL  CBG monitoring, ED     Status: Abnormal   Collection Time: 11/27/15  1:12 PM  Result Value Ref Range   Glucose-Capillary 134 (H) 65 - 99 mg/dL  Basic metabolic panel     Status: Abnormal   Collection Time: 11/27/15  2:09 PM  Result Value Ref Range   Sodium 137 135 - 145 mmol/L   Potassium 4.0 3.5 - 5.1 mmol/L   Chloride 91 (L) 101 - 111 mmol/L   CO2 32 22 - 32 mmol/L   Glucose, Bld 140 (H) 65 - 99 mg/dL   BUN 15 6 - 20 mg/dL   Creatinine, Ser 1.53 (H) 0.61 - 1.24 mg/dL   Calcium 8.3 (L) 8.9 - 10.3 mg/dL   GFR calc non Af Amer >60 >60 mL/min   GFR calc Af Amer >60 >60 mL/min    Comment:  (NOTE) The eGFR has been calculated using the CKD EPI equation. This calculation has not been validated in all clinical situations. eGFR's persistently <60 mL/min signify possible Chronic Kidney Disease.    Anion gap 14 5 - 15  CBG monitoring, ED     Status: Abnormal   Collection Time: 11/27/15  2:21 PM  Result Value Ref Range   Glucose-Capillary 151 (H) 65 - 99 mg/dL    Ct Abdomen Pelvis W Contrast  11/27/2015  ADDENDUM REPORT: 11/27/2015 15:14 ADDENDUM: Findings conveyed toBrian Miller, and heon 11/27/2015  at14:14. Electronically Signed   By: Suzy Bouchard M.D.   On: 11/27/2015 15:14  11/27/2015  CLINICAL DATA:  Pancreatitis with pseudocyst formation. Persists abdominal pain. EXAM: CT ABDOMEN AND PELVIS WITH CONTRAST TECHNIQUE: Multidetector CT imaging of the abdomen and pelvis was performed using the standard protocol following bolus administration of intravenous contrast. CONTRAST:  26m OMNIPAQUE IOHEXOL 300 MG/ML  SOLN COMPARISON:  CT 11/06/2015, 10/21/2015 FINDINGS: Lower chest: Lung bases are clear. Hepatobiliary: No focal hepatic lesion. No intrahepatic biliary duct dilatation. The common bile duct appears normal. The gallbladder is normal. Pancreas: Body and tail the pancreas are normal. The head of the pancreas is indistinct. There is extensive inflammation surrounding head of the pancreas. Small gas locules adjacent to the head of the pancreas. Pseudocyst inferior to the pancreatic head and third portion the duodenum is decreased in size measuring 4.7 by 1.7 cm decreased from 6.7 by 2.5 cm (image 53, series 2). There is extensive inflammation along the second portion duodenum. There small gas locules in the porta hepatis. Spleen: Normal spleen Adrenals/urinary tract: Adrenal glands and kidneys are normal. The ureters and bladder normal. Stomach/Bowel: Proximal stomach is normal. There is inflammation in the gastric antrum/ pyloric region. Extensive inflammation along the second portion  the duodenum, gallbladder fossa and extending  into the Morrison's pouch along the ascending colon. Several small loculated gas along the second portion duodenum, gallbladder fossa, Morrison's pouch, and ascending colon. No large volume intraperitoneal free air. Vascular/Lymphatic: Abdominal aorta is normal caliber. There is no retroperitoneal or periportal lymphadenopathy. No pelvic lymphadenopathy. Reproductive: Prostate normal. Small amount free fluid in the pelvis. Free fluid is similar prior. Other: Small amount free fluid the pelvis Musculoskeletal: No aggressive osseous lesion. IMPRESSION: 1. Multiple small loculations of gas along the second portion duodenum and extending into Morrison's pouch. Findings consistent with MICRO PERFORATION presumably of the stomach or duodenum and less likely hepatic flexure of the colon. Differential would include post pancreatitis perforation, necrotizing infection versus iatrogenic (query recent endoscopy). 2. Extensive inflammation along the second portion duodenum, head of the pancreas, and Morrison's pouch. Degree of inflammation is similar to comparison exam. The fluid collections are decreased in volume. 3. Inflammation along the hepatic flexure of the colon likely secondary to the prior pancreatitis. Electronically Signed: By: Suzy Bouchard M.D. On: 11/27/2015 13:56    Review of Systems  Constitutional: Positive for weight loss (40 pounds since January). Negative for fever, chills, malaise/fatigue and diaphoresis.  HENT: Negative.   Eyes: Negative.   Respiratory: Negative.   Cardiovascular: Positive for chest pain (yesterday), orthopnea and PND. Negative for palpitations, claudication and leg swelling.  Gastrointestinal: Positive for heartburn, nausea, vomiting (some blood in emesis) and abdominal pain. Negative for diarrhea, constipation, blood in stool and melena.  Genitourinary: Negative.   Musculoskeletal: Negative.   Skin: Negative.   Neurological:  Negative.  Negative for weakness.  Endo/Heme/Allergies: Negative.   Psychiatric/Behavioral: Negative.    Blood pressure 119/64, pulse 89, temperature 98.5 F (36.9 C), temperature source Oral, resp. rate 28, SpO2 99 %. Physical Exam  Constitutional: He is oriented to person, place, and time. He appears well-developed and well-nourished.  No acute distress, but having allot of ongoing abdominal pain.  HENT:  Head: Normocephalic and atraumatic.  Nose: Nose normal.  Eyes: Conjunctivae and EOM are normal. Right eye exhibits no discharge. Left eye exhibits no discharge. No scleral icterus.  Neck: Normal range of motion. Neck supple. No JVD present. No tracheal deviation present. No thyromegaly present.  Cardiovascular: Normal rate, regular rhythm, normal heart sounds and intact distal pulses.   No murmur heard. Respiratory: Effort normal and breath sounds normal. No respiratory distress. He has no wheezes. He has no rales. He exhibits no tenderness.  GI: Soft. He exhibits no distension and no mass. There is tenderness. There is no rebound and no guarding.  No bowel sounds, he is tender all over.    Musculoskeletal: He exhibits no edema.  Lymphadenopathy:    He has no cervical adenopathy.  Neurological: He is alert and oriented to person, place, and time. No cranial nerve deficit.  Skin: Skin is warm and dry. No rash noted. No erythema. No pallor.  Psychiatric: He has a normal mood and affect. His behavior is normal. Judgment and thought content normal.   . sodium chloride Stopped (11/27/15 0915)  . sodium chloride 125 mL/hr at 11/27/15 0915  . dextrose 5 % and 0.45% NaCl 125 mL/hr at 11/27/15 1142  . imipenem-cilastatin    . insulin (NOVOLIN-R) infusion 0.9 Units/hr (11/27/15 1423)    Assessment/Plan: Recurrent pancreatitis  Microperforation of the stomach or duodenum with significant inflammatory changes second portion of the duodenum, head of the pancreas, hepatic flexure of the  colon. Diabetes, not sure if it's type I or II  (HA1C  12.4  - 10/17/15) Hypertension Dyslipidemia with elevated triglycerides  Plan:  Discussed with Dr. Grandville Silos, we will place him on Primaxin.  Keep him NPO, agree with hydration and see him in the AM.  If he is doing well we may get an upper GI. He is on an Insulin drip now to control his glucose and being vigorously rehydrated.   With the degree of inflammation noted on CT we are hesitant to try to take him to the OR unless we have to.    Corra Kaine 11/27/2015, 3:18 PM

## 2015-11-27 NOTE — ED Notes (Signed)
Pt is to be strict NPO with no Reglan or Phenergan for next 6 hrs for NM scan.

## 2015-11-27 NOTE — Progress Notes (Signed)
CT showing micro perforations from stomach vs duodenum.  Consulted general surgery BCX, Cipro/Flagyl.  Shelly Flatten, MD [facility] 11/27/2015, 3:18 PM

## 2015-11-27 NOTE — ED Notes (Signed)
Attempted report- assigned room is closed on unit.  Pt placement to be notified.

## 2015-11-27 NOTE — ED Provider Notes (Signed)
CSN: [id number]     Arrival date & time 11/26/15  2332 History   First MD Initiated Contact with Patient 11/27/15 0515     Chief Complaint  Patient presents with  . Pancreatitis      HPI Hx of gastroparesis, presenting with 3-4 days of nonbloody vomit. Decreased oral intake. Reports generalized weakness. Denies fever. No CP or SOB. No urinary complaints. Hx of Type II diabetes, compliant with medications.  Recent dx of chronic pancreatitis with pseudocyst. Denies ongoing ETOH use. Pain is moderate in severity   Past Medical History  Diagnosis Date  . Hypertension   . Chronic bronchitis ([place])     "get it about q year" (11/20/2013)  . DM type 1 (diabetes mellitus, type 1) ([place]) 11/2013    oral agents and insulin  . Hypertriglyceridemia   . Microalbuminuria   . Molluscum contagiosum 06/2015    upper extremities.   . Hepatic steatosis 11/2013    seen on CT, LFTs normal.  . Pancreatitis 10/2015    likely present 11/2013 but no lipase at that time.  due to elevated triglycerides and ETOH  . Obesity    Past Surgical History  Procedure Laterality Date  . Tonsillectomy and adenoidectomy  ~ 1999  . Esophagogastroduodenoscopy N/A 11/21/2013    Procedure: ESOPHAGOGASTRODUODENOSCOPY (EGD);  Surgeon: Wonda Horner, MD;  Location: [facility] ENDOSCOPY;  Service: Endoscopy;  Laterality: N/A;   Family History  Problem Relation Age of Onset  . Diabetes Mellitus II Father   . Heart failure Father   . Asthma Mother   . Diabetes Mellitus I Sister    Social History  Substance Use Topics  . Smoking status: Former Smoker -- 0.00 packs/day for 3 years    Types: Cigarettes    Quit date: 12/11/2013  . Smokeless tobacco: Never Used  . Alcohol Use: Yes     Comment: drinks beer-last drink was New Year's Day    Review of Systems  All other systems reviewed and are negative.     Allergies  Review of patient's allergies indicates no known allergies.  Home Medications   Prior to Admission medications    Medication Sig Start Date End Date Taking? Authorizing Provider  acetaminophen (TYLENOL) 325 MG tablet Take 2 tablets (650 mg total) by mouth every 6 (six) hours as needed for mild pain (or Fever >/= 101). 11/12/15  Yes Velvet Bathe, MD  fenofibrate (TRICOR) 145 MG tablet Take 1 tablet (145 mg total) by mouth daily. 05/22/15  Yes Arnoldo Morale, MD  insulin aspart (NOVOLOG) 100 UNIT/ML injection Inject 3-10 Units into the skin 3 (three) times daily. Blood Glucose 150 - 200, give 3 units Blood Glucose 201 - 250, give 5 units Blood Glucose 251 - 300, give 7 units Blood Glucose 301 - 350, give 10 units 10/31/15  Yes Tresa Garter, MD  Insulin Glargine (LANTUS) 100 UNIT/ML Solostar Pen Inject 45 Units into the skin daily at 10 pm. 10/31/15  Yes Tresa Garter, MD  lisinopril (PRINIVIL,ZESTRIL) 10 MG tablet Take 1 tablet (10 mg total) by mouth daily. 06/25/15  Yes Arnoldo Morale, MD  metFORMIN (GLUCOPHAGE) 500 MG tablet Take 2 tablets (1,000 mg total) by mouth 2 (two) times daily with a meal. 06/25/15  Yes Arnoldo Morale, MD  metoCLOPramide (REGLAN) 5 MG tablet Take 1 tablet (5 mg total) by mouth 3 (three) times daily before meals. Patient taking differently: Take 5 mg by mouth every 8 (eight) hours as needed for nausea.  11/19/15  Yes Annita Brod, MD  omeprazole (PRILOSEC) 40 MG capsule Take 1 capsule (40 mg total) by mouth daily. 10/31/15  Yes Tresa Garter, MD  promethazine (PHENERGAN) 25 MG tablet Take 1 tablet (25 mg total) by mouth every 8 (eight) hours as needed for nausea or vomiting. 11/12/15  Yes Velvet Bathe, MD  Blood Glucose Monitoring Suppl (ACCU-CHEK AVIVA PLUS) W/DEVICE KIT Check blood sugars 4 times daily (before meals and at bedtime) Patient not taking: Reported on 11/27/2015 12/20/14   Tresa Garter, MD  glucose blood (ACCU-CHEK AVIVA) test strip Use as instructed Patient not taking: Reported on 11/27/2015 12/20/14   Tresa Garter, MD  Lancet Devices [facility]) lancets Use as instructed Patient not taking: Reported on 11/27/2015 12/20/14   Tresa Garter, MD   BP 120/65 mmHg  Pulse 88  Temp(Src) 98.5 F (36.9 C) (Oral)  Resp 23  SpO2 95% Physical Exam  Constitutional: He is oriented to person, place, and time. He appears well-developed and well-nourished.  HENT:  Head: Normocephalic and atraumatic.  Eyes: EOM are normal.  Neck: Normal range of motion.  Cardiovascular: Normal rate, regular rhythm, normal heart sounds and intact distal pulses.   Pulmonary/Chest: Effort normal and breath sounds normal. No respiratory distress.  Abdominal: Soft. He exhibits no distension.  Epigastric tenderness without guarding or rebouund  Musculoskeletal: Normal range of motion.  Neurological: He is alert and oriented to person, place, and time.  Skin: Skin is warm and dry.  Psychiatric: He has a normal mood and affect. Judgment normal.  Nursing note and vitals reviewed.   ED Course  Procedures (including critical care time) Labs Review Labs Reviewed  COMPREHENSIVE METABOLIC PANEL - Abnormal; Notable for the following:    Potassium 2.8 (*)    Chloride 79 (*)    CO2 33 (*)    Glucose, Bld 249 (*)    Creatinine, Ser 2.25 (*)    Total Protein 8.5 (*)    AST 13 (*)    ALT 10 (*)    GFR calc non Af Amer 39 (*)    GFR calc Af Amer 46 (*)    Anion gap 23 (*)    All other components within normal limits  CBC - Abnormal; Notable for the following:    Platelets 503 (*)    All other components within normal limits  CBG MONITORING, ED - Abnormal; Notable for the following:    Glucose-Capillary 252 (*)    All other components within normal limits  LIPASE, BLOOD  URINALYSIS, ROUTINE W REFLEX MICROSCOPIC (NOT AT [facility])   BUN  Date Value Ref Range Status  11/26/2015 19 6 - 20 mg/dL Final  11/13/2015 12 6 - 20 mg/dL Final  11/12/2015 9 6 - 20 mg/dL Final  11/11/2015 <5* 6 - 20 mg/dL Final   CREAT  Date Value Ref Range Status  06/25/2015  0.95 0.60 - 1.35 mg/dL Final  12/06/2014 0.85 0.50 - 1.35 mg/dL Final   CREATININE, SER  Date Value Ref Range Status  11/26/2015 2.25* 0.61 - 1.24 mg/dL Final  11/13/2015 1.17 0.61 - 1.24 mg/dL Final  11/12/2015 1.21 0.61 - 1.24 mg/dL Final  11/11/2015 1.00 0.61 - 1.24 mg/dL Final       Imaging Review No results found. I have personally reviewed and evaluated these images and lab results as part of my medical decision-making.   EKG Interpretation   Date/Time:  Tuesday November 26 2015 23:40:46 EST Ventricular Rate:  126 PR  Interval:  128 QRS Duration: 80 QT Interval:  414 QTC Calculation: 599 R Axis:   78 Text Interpretation:  ** Critical Test Result: Long QTc Sinus tachycardia  ST' \T' \ T wave abnormality, consider inferior ischemia Abnormal ECG No  significant change was found Confirmed by Aalliyah Kilker  MD, Shalamar Crays ([id number]) on  11/27/2015 12:40:30 AM      MDM   Final diagnoses:  Nausea and vomiting, vomiting of unspecified type  Dehydration  AKI (acute kidney injury) ([place])   Presents with likely gastroparesis, dehydration, AKI. Will hydrate. Replacement of K. Admit for dehydration and AKI. Pain improving. No indication for imaging at this time    Jola Schmidt, MD 11/27/15 0630

## 2015-11-27 NOTE — ED Notes (Signed)
CBG 134 

## 2015-11-27 NOTE — ED Notes (Signed)
CBG 210 

## 2015-11-27 NOTE — Consult Note (Signed)
[facility] Consult: 10:31 AM 11/27/2015  LOS: 0 days    Referring Provider: Dr Marily Memos  Primary Care Physician:  Angelica Chessman, MD Primary Gastroenterologist:  Althia Forts, never seen in a GI office.  Dr Penelope Coop in 2015, Dr Hilarie Fredrickson 10/2015.     Reason for Consultation: Abdominal pain and nausea vomiting.  HPI: Brent Costa is a 25 y.o. male. 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 Microalbuminuria.Molluscum contagiosum. Hypertriglyceridemia (966 in 12/2014). Fatty liver on 11/2013 CT. Molluscum contagiosum.  11/21/2013 EGD by Dr.Ganem for epigastric pain and CT with inflammatory appearance in region of duodenum. Normal study. LFTs then normal, Lipase not assayed. Triglycerides in March 2016 were 966.  Admission 1/11- 10/26/15 with acute pancreatitis, intractable nausea and vomiting. Lipase maximum of 595 on admission, quickly normalized and 21 on the day of discharge. Patient admitted to binge drinking, this along with hypertriglyceridemia ( apex level 3108) was felt to be the cause of the pancreatitis. He was treated with NG tube decompression as the pancreatic head inflammation caused duodenal inflammation and poor gastric emptying. Two CT scans performed.  CT scan #1 10/21/15 showed inflammatory changes in the retroperitoneum adjacent to the duodenum and pancreatic head, location of the inflammation inflammation consistent with that seen on CT 11/2013 but overall more severe. Hepatic steatosis persistent compared with previous CT. CT scan #2, 10/21/2015 showing worsening pancreatic inflammation with increased peripancreatic fluid. No pancreatic necrosis, abscess or pseudocyst. No venous thrombosis. He was discharged on TriCor for control of his  hypertriglyceridemia.  ED visit and  CT scan 11/06/2015 showing interval evolution of peripancreatic edema/inflammation and evolving complex pseudocyst at the head of the pancreas (5.8 x 7.8 x 11.1 cm). Still no evidence for pancreatic necrosis, PD Not dilated. With IV fluids, IV narcotics and anti-emetics he felt better, was tolerating ginger ale and crackers and sent home with narcotics and antirheumatic prescriptions .  Was to follow up with GI.    Admission 2/4 - 11/12/15 with nausea, emesis and ongoing abdominal pain, mild DKA, lipase 47, glucose 355, triglycerides 241. He was diagnosed with a UTI and discharged on Keflex. However urine culture was negative and review of the 2/1, 2/4, 2/5 and 2/8 U/As are not sugg of a UTI. Other meds at discharge included ongoing Percocet, Prilosec, metformin, insulin, TriCor, when necessary Phenergan.  Readmit 2/8 -  11/19/15 with ongoing abd pain and n/v.  Lipase 30, normal LFTs, glucose 231.  Dr Silverio Decamp suggested repeat CT imaging in early to mid March (6-8 weeks) and continue supportive care.  As the pseudocyst was too young and there was no suspicion for necrotizing pancreatitis or infected pseudocyst, no intervention was indicated.  Hospitalist started him on oral Reglan 5 mg TID AC at discharge for suspected diabetic gastroparesis.  During the course of the hospitalization IV Reglan was helpful and diet was advanced though pt never pain or nausea free.  Now readmitted, awaiting room in the ED currently, with ongoing abdominal pain, N/V.  Says the sxs never truly subsided during recent  admit but definitely worse last night.  Unable to keep down po meds.  Vomits 7 to 10 x per day and in last 24 hours, has seen small streaks of blood in otherwise non blood/non CG emesis.  Symptoms improve after he vomits but return as time and gastric contents build. Pain focussed in epigastric region and some radiation into his back and lower abdomen.. Decreased stooling: last BM was  at least 3 days ago. Sugars have been running in the 190 range. Lipase so remains normal at 35. LFTs are actually below normal. Glucose is 280. Potassium low at 2.8.  Creatinine has jumped from 1.1-1.2 during recent admission, up to 2.2 at ED arrival last night. BUN is normal. White blood cell count is not elevated. Following IV fluid hydration, his hemoglobin is 10.9 down from 13.6 (baseline 11-12.5). He has not had any repeat GI imaging. No ETOH for several weeks.    Past Medical History  Diagnosis Date  . Hypertension   . Chronic bronchitis ([place])     "get it about q year" (11/20/2013)  . DM type 1 (diabetes mellitus, type 1) ([place]) 11/2013    oral agents and insulin  . Hypertriglyceridemia   . Microalbuminuria   . Molluscum contagiosum 06/2015    upper extremities.   . Hepatic steatosis 11/2013    seen on CT, LFTs normal.  . Pancreatitis 10/2015    likely present 11/2013 but no lipase at that time.  due to elevated triglycerides and ETOH  . Obesity     Past Surgical History  Procedure Laterality Date  . Tonsillectomy and adenoidectomy  ~ 1999  . Esophagogastroduodenoscopy N/A 11/21/2013    Procedure: ESOPHAGOGASTRODUODENOSCOPY (EGD);  Surgeon: Wonda Horner, MD;  Location[facility] ENDOSCOPY;  Service: Endoscopy;  Laterality: N/A;    Prior to Admission medications   Medication Sig Start Date End Date Taking? Authorizing Provider  acetaminophen (TYLENOL) 325 MG tablet Take 2 tablets (650 mg total) by mouth every 6 (six) hours as needed for mild pain (or Fever >/= 101). 11/12/15  Yes Velvet Bathe, MD  fenofibrate (TRICOR) 145 MG tablet Take 1 tablet (145 mg total) by mouth daily. 05/22/15  Yes Arnoldo Morale, MD  insulin aspart (NOVOLOG) 100 UNIT/ML injection Inject 3-10 Units into the skin 3 (three) times daily. Blood Glucose 150 - 200, give 3 units Blood Glucose 201 - 250, give 5 units Blood Glucose 251 - 300, give 7 units Blood Glucose 301 - 350, give 10 units 10/31/15  Yes Tresa Garter,  MD  Insulin Glargine (LANTUS) 100 UNIT/ML Solostar Pen Inject 45 Units into the skin daily at 10 pm. 10/31/15  Yes Tresa Garter, MD  lisinopril (PRINIVIL,ZESTRIL) 10 MG tablet Take 1 tablet (10 mg total) by mouth daily. 06/25/15  Yes Arnoldo Morale, MD  metFORMIN (GLUCOPHAGE) 500 MG tablet Take 2 tablets (1,000 mg total) by mouth 2 (two) times daily with a meal. 06/25/15  Yes Arnoldo Morale, MD  metoCLOPramide (REGLAN) 5 MG tablet Take 1 tablet (5 mg total) by mouth 3 (three) times daily before meals. Patient taking differently: Take 5 mg by mouth every 8 (eight) hours as needed for nausea.  11/19/15  Yes Annita Brod, MD  omeprazole (PRILOSEC) 40 MG capsule Take 1 capsule (40 mg total) by mouth daily. 10/31/15  Yes Tresa Garter, MD  promethazine (PHENERGAN) 25 MG tablet Take 1 tablet (25 mg total) by mouth every 8 (eight) hours as needed for nausea or vomiting. 11/12/15  Yes Velvet Bathe, MD  Blood Glucose Monitoring Suppl (ACCU-CHEK AVIVA PLUS) W/DEVICE KIT Check blood sugars 4 times daily (before meals and at bedtime) Patient not taking: Reported on 11/27/2015 12/20/14   Tresa Garter, MD  glucose blood (ACCU-CHEK AVIVA) test strip Use as instructed Patient not taking: Reported on 11/27/2015 12/20/14   Tresa Garter, MD  Lancet Devices [facility]) lancets Use as instructed Patient not taking: Reported on 11/27/2015 12/20/14   Tresa Garter, MD    Scheduled Meds: . enoxaparin (LOVENOX) injection  40 mg Subcutaneous Q24H  . fenofibrate  54 mg Oral Daily  . hydrocortisone   Topical BID  . metoCLOPramide (REGLAN) injection  10 mg Intravenous 3 times per day  . pantoprazole  80 mg Oral Daily  . sodium chloride flush  3 mL Intravenous Q12H   Infusions: . sodium chloride Stopped (11/27/15 0915)  . sodium chloride 999 mL/hr at 11/27/15 0914  . sodium chloride 125 mL/hr at 11/27/15 0915  . dextrose 5 % and 0.45% NaCl    . insulin (NOVOLIN-R) infusion 1.9 Units/hr  (11/27/15 1020)  . magnesium sulfate 1 - 4 g bolus IVPB    . potassium chloride 10 mEq (11/27/15 0946)   PRN Meds: acetaminophen **OR** acetaminophen, hydrALAZINE, ondansetron **OR** ondansetron (ZOFRAN) IV, oxyCODONE, promethazine   Allergies as of 11/26/2015  . (No Known Allergies)    Family History  Problem Relation Age of Onset  . Diabetes Mellitus II Father   . Heart failure Father   . Asthma Mother   . Diabetes Mellitus I Sister     Social History   Social History  . Marital Status: Single    Spouse Name: N/A  . Number of Children: N/A  . Years of Education: N/A   Occupational History  . Not on file.   Social History Main Topics  . Smoking status: Former Smoker -- 0.00 packs/day for 3 years    Types: Cigarettes    Quit date: 12/11/2013  . Smokeless tobacco: Never Used  . Alcohol Use: Yes     Comment: drinks beer-last drink was New Year's Day  . Drug Use: 2.00 per week    Special: Marijuana     Comment: 11/20/2013 "used to use marijuana; used it last < 1 month ago"  . Sexual Activity: Yes    Birth Control/ Protection: Condom   Other Topics Concern  . Not on file   Social History Narrative    REVIEW OF SYSTEMS: Constitutional:  Do not yet have a recorded weight for this encounter. ENT:  No nose bleeds Pulm:  No cough, no shortness of breath. CV:  No palpitations, no LE edema. No chest pain GU:  No hematuria, no frequency GI:  No dysphagia, no diarrhea.  Heme:  No excessive bleeding or bruising.    Transfusions:  none Neuro:  No headaches, no peripheral tingling or numbness Derm:  No itching, no rash or sores.  Endocrine:  No sweats or chills.  No polyuria or dysuria Immunization:  Flu shot 10/2015.  Travel:  None beyond local counties in last few months.    PHYSICAL EXAM: Vital signs in last 24 hours: Filed Vitals:   11/27/15 0830 11/27/15 0915  BP: 128/54 115/51  Pulse: 94 101  Temp:    Resp: 15 21   Wt Readings from Last 3 Encounters:    11/13/15 102 kg (224 lb 13.9 oz)  11/12/15 107.956 kg (238 lb)  11/10/15 108.4 kg (238 lb 15.7 oz)  General: pleasant, looks moderately ill.  Head:  No signs of trauma, no facial swelling  Eyes:  No icterus or conj pallor Ears:  Hearing well intact  Nose:  No disharge or congestion Mouth:  Somewhat dry, pale mucosa.  No lesions.  Teeth unremarkable Neck:  No mass, no JVD Lungs:  Clear bil.  Though reduced in bases bil  No cough or dyspnea Heart: RRR.  No mrg.  S1/s2 audible Abdomen:  Large, tender on right but no guard/rebound.  BS present, reduced but not tinkling or tympanitic.Marland Kitchen   Rectal: deferred   Musc/Skeltl: no joint erythema or contractures or swelling Extremities:  No CCE  Neurologic:  Oriented x 3.  No tremor.  A bit slow to respond due to recent meds.  Moves all 4 llimbs Skin:  Condylomata on arms Tattoos:  Multiple, large on arms, trunk Nodes:  No cervical adenopathy.   Psych:  Cooperative.  Affect blunted c/w acute illness.   Intake/Output from previous day:   Intake/Output this shift:    LAB RESULTS:  Recent Labs  11/26/15 2349 11/27/15 0937  WBC 9.9 6.3  HGB 13.6 10.9*  HCT 41.1 33.6*  PLT 503* 332   BMET Lab Results  Component Value Date   NA 137 11/27/2015   NA 135 11/26/2015   NA 138 11/13/2015   K 3.3* 11/27/2015   K 2.8* 11/26/2015   K 3.8 11/13/2015   CL 88* 11/27/2015   CL 79* 11/26/2015   CL 95* 11/13/2015   CO2 36* 11/27/2015   CO2 33* 11/26/2015   CO2 25 11/13/2015   GLUCOSE 280* 11/27/2015   GLUCOSE 249* 11/26/2015   GLUCOSE 231* 11/13/2015   BUN 17 11/27/2015   BUN 19 11/26/2015   BUN 12 11/13/2015   CREATININE 1.82* 11/27/2015   CREATININE 2.25* 11/26/2015   CREATININE 1.17 11/13/2015   CALCIUM 8.3* 11/27/2015   CALCIUM 9.9 11/26/2015   CALCIUM 10.2 11/13/2015   LFT  Recent Labs  11/26/15 2349  PROT 8.5*  ALBUMIN 3.7  AST 13*  ALT 10*  ALKPHOS 105  BILITOT 1.0   PT/INR Lab Results  Component Value Date    INR 1.02 10/17/2015   Hepatitis Panel No results for input(s): HEPBSAG, HCVAB, HEPAIGM, HEPBIGM in the last 72 hours. C-Diff No components found for: CDIFF Lipase     Component Value Date/Time   LIPASE 35 11/26/2015 2349    Drugs of Abuse     Component Value Date/Time   LABOPIA NONE DETECTED 10/16/2015 2351   COCAINSCRNUR NONE DETECTED 10/16/2015 2351   LABBENZ NONE DETECTED 10/16/2015 2351   AMPHETMU NONE DETECTED 10/16/2015 2351   THCU NONE DETECTED 10/16/2015 2351   LABBARB NONE DETECTED 10/16/2015 2351     RADIOLOGY STUDIES: No results found.  ENDOSCOPIC STUDIES: Per HPI  IMPRESSION:   *  Abdominal pain and n/v and scant streaks of hematemesis in patient with history of acute pancreatitis due to hypertriglyceridemia and EtOH.  At the latest CT scan of 11/06/15, he had new onset pseudocyst formation. This may be causing element of gastric outlet obstruction. Right now his lipase is normal but may indeed have ongoing active pancreatitis.   *  IDDM.  Currently on insulin drip.    *  Electrolyte abnormality, supplementation in progress.   *  AKI.      PLAN:     *  Cancelled the GES.  Ordered IV contrast CT, though do not think he well tolerate po contrast.   *  Ice chips.    *  Needs IV meds where possible, so switching to IV Protonix q 24.  Scheduled IV reglan and zofran. Added prn phenergan.    Azucena Freed  11/27/2015, 10:31 AM Pager: [phone]

## 2015-11-27 NOTE — H&P (Signed)
[facility] History and Physical  NICLAS MARKELL JHE:[id number] DOB: 1991/07/28 DOA: 11/27/2015  Referring physician: Dr Venora Maples PCP: Angelica Chessman, MD   Chief Complaint: MCED  HPI: Brent Costa is a 25 y.o. male  Nausea vomiting abdominal pain. Present since time of last discharge. Patient was discharged from [facility] on 11/19/2015. Patient states compliance with his Reglan and insulin regimens. States that symptoms improve after vomiting. Currently abdominal pain is diffuse with focalization in the epigastric region with radiation to his back. Pain is constant with waxing and waning nature. Denies any dysuria, frequency, chest pain, shortness of breath, fevers, rash. Since his home sugars are around 190 but only checks his sugars once a day.   Review of Systems:  Constitutional:  No weight loss, night sweats, Fevers, chills, HEENT:  No headaches, Difficulty swallowing,Tooth/dental problems,Sore throat, Cardio-vascular:  No chest pain, Orthopnea, PND, swelling in lower extremities, anasarca, dizziness, palpitations  GI:  Per HPI Resp:   No shortness of breath with exertion or at rest. No excess mucus, no productive cough, No non-productive cough, No coughing up of blood.No change in color of mucus.No wheezing.No chest wall deformity  Skin:  no rash or lesions.  GU:  no dysuria, change in color of urine, no urgency or frequency. No flank pain.  Musculoskeletal:   No joint pain or swelling. No decreased range of motion. No back pain.  Psych:  No change in mood or affect. No depression or anxiety. No memory loss.  Neuro:  No change in sensation, unilateral strength, or cognitive abilities  All other systems were reviewed and are negative.  Past Medical History  Diagnosis Date  . Hypertension   . Chronic bronchitis ([place])     "get it about q year" (11/20/2013)  . DM type 1 (diabetes mellitus, type 1) ([place]) 11/2013    oral agents and insulin  .  Hypertriglyceridemia   . Microalbuminuria   . Molluscum contagiosum 06/2015    upper extremities.   . Hepatic steatosis 11/2013    seen on CT, LFTs normal.  . Pancreatitis 10/2015    likely present 11/2013 but no lipase at that time.  due to elevated triglycerides and ETOH  . Obesity    Past Surgical History  Procedure Laterality Date  . Tonsillectomy and adenoidectomy  ~ 1999  . Esophagogastroduodenoscopy N/A 11/21/2013    Procedure: ESOPHAGOGASTRODUODENOSCOPY (EGD);  Surgeon: Wonda Horner, MD;  Location[facility] ENDOSCOPY;  Service: Endoscopy;  Laterality: N/A;   Social History:  reports that he quit smoking about 1 years ago. His smoking use included Cigarettes. He smoked 0.00 packs per day for 3 years. He has never used smokeless tobacco. He reports that he drinks alcohol. He reports that he uses illicit drugs (Marijuana) about twice per week.  No Known Allergies  Family History  Problem Relation Age of Onset  . Diabetes Mellitus II Father   . Heart failure Father   . Asthma Mother   . Diabetes Mellitus I Sister      Prior to Admission medications   Medication Sig Start Date End Date Taking? Authorizing Provider  acetaminophen (TYLENOL) 325 MG tablet Take 2 tablets (650 mg total) by mouth every 6 (six) hours as needed for mild pain (or Fever >/= 101). 11/12/15  Yes Velvet Bathe, MD  fenofibrate (TRICOR) 145 MG tablet Take 1 tablet (145 mg total) by mouth daily. 05/22/15  Yes Arnoldo Morale, MD  insulin aspart (NOVOLOG) 100 UNIT/ML injection Inject 3-10 Units into the  skin 3 (three) times daily. Blood Glucose 150 - 200, give 3 units Blood Glucose 201 - 250, give 5 units Blood Glucose 251 - 300, give 7 units Blood Glucose 301 - 350, give 10 units 10/31/15  Yes Tresa Garter, MD  Insulin Glargine (LANTUS) 100 UNIT/ML Solostar Pen Inject 45 Units into the skin daily at 10 pm. 10/31/15  Yes Tresa Garter, MD  lisinopril (PRINIVIL,ZESTRIL) 10 MG tablet Take 1 tablet (10 mg total) by  mouth daily. 06/25/15  Yes Arnoldo Morale, MD  metFORMIN (GLUCOPHAGE) 500 MG tablet Take 2 tablets (1,000 mg total) by mouth 2 (two) times daily with a meal. 06/25/15  Yes Arnoldo Morale, MD  metoCLOPramide (REGLAN) 5 MG tablet Take 1 tablet (5 mg total) by mouth 3 (three) times daily before meals. Patient taking differently: Take 5 mg by mouth every 8 (eight) hours as needed for nausea.  11/19/15  Yes Annita Brod, MD  omeprazole (PRILOSEC) 40 MG capsule Take 1 capsule (40 mg total) by mouth daily. 10/31/15  Yes Tresa Garter, MD  promethazine (PHENERGAN) 25 MG tablet Take 1 tablet (25 mg total) by mouth every 8 (eight) hours as needed for nausea or vomiting. 11/12/15  Yes Velvet Bathe, MD  Blood Glucose Monitoring Suppl (ACCU-CHEK AVIVA PLUS) W/DEVICE KIT Check blood sugars 4 times daily (before meals and at bedtime) Patient not taking: Reported on 11/27/2015 12/20/14   Tresa Garter, MD  glucose blood (ACCU-CHEK AVIVA) test strip Use as instructed Patient not taking: Reported on 11/27/2015 12/20/14   Tresa Garter, MD  Lancet Devices [facility]) lancets Use as instructed Patient not taking: Reported on 11/27/2015 12/20/14   Tresa Garter, MD   Physical Exam: Filed Vitals:   11/27/15 0715 11/27/15 0730 11/27/15 0815 11/27/15 0830  BP: 119/55 115/60 115/97 128/54  Pulse: 84 79 92 94  Temp:      TempSrc:      Resp: '20 21 20 15  ' SpO2: 97% 99% 93% 97%    Wt Readings from Last 3 Encounters:  11/13/15 102 kg (224 lb 13.9 oz)  11/12/15 107.956 kg (238 lb)  11/10/15 108.4 kg (238 lb 15.7 oz)    General:  Ill appearing but calm Eyes:  PERRL, EOMI, normal lids, iris ENT:  Dry MM, no lymphadenopathy Neck:  no LAD, masses or thyromegaly Cardiovascular:  RRR, no m/r/g. No LE edema.  Respiratory:  CTA bilaterally, no w/r/r. Normal respiratory effort. Abdomen: Hypoactive BS, non distended Skin: diffuse rash on arms and legs. Papular in nature Musculoskeletal:  grossly  normal tone BUE/BLE Psychiatric:  grossly normal mood and affect, speech fluent and appropriate Neurologic:  CN 2-12 grossly intact, moves all extremities in coordinated fashion.          Labs on Admission:  Basic Metabolic Panel:  Recent Labs Lab 11/26/15 2349  NA 135  K 2.8*  CL 79*  CO2 33*  GLUCOSE 249*  BUN 19  CREATININE 2.25*  CALCIUM 9.9   Liver Function Tests:  Recent Labs Lab 11/26/15 2349  AST 13*  ALT 10*  ALKPHOS 105  BILITOT 1.0  PROT 8.5*  ALBUMIN 3.7    Recent Labs Lab 11/26/15 2349  LIPASE 35   No results for input(s): AMMONIA in the last 168 hours. CBC:  Recent Labs Lab 11/26/15 2349  WBC 9.9  HGB 13.6  HCT 41.1  MCV 80.6  PLT 503*   Cardiac Enzymes: No results for input(s): CKTOTAL, CKMB, CKMBINDEX, TROPONINI  in the last 168 hours.  BNP (last 3 results) No results for input(s): BNP in the last 8760 hours.  ProBNP (last 3 results) No results for input(s): PROBNP in the last 8760 hours.   CREATININE: 2.25 mg/dL ABNORMAL (11/26/15 2349) Estimated creatinine clearance - 60.1 mL/min  CBG:  Recent Labs Lab 11/26/15 2346  GLUCAP 252*    Radiological Exams on Admission: No results found.    Assessment/Plan Active Problems:   Uncontrolled diabetes mellitus without complication (HCC)   Essential hypertension   Nausea and vomiting   Pancreatic pseudocyst   AKI (acute kidney injury) ([place])   DKA (diabetic ketoacidoses) ([place])   DKA: Gluc 249 w/ anion gap 23, K 2.8. Suspect combination of medical non-compliance and poor oral intake from ongoing gastroparesis/pancreatitis. Last A1c 12.9 correlates w/ Avg home glucose of 300+. - Glucomander - Replace K, check mag - resume home regimen when stable.  - UA, serum ketone, ABG   Abd pain: h/o complicated by chronic pancreatitis w/ pseudocyst and newly dx gastroparesis. No improvement after last admission despite reglan. Lipase nml. Pt states that GI workup at last admission was  incomplete as no EGD performed as previously planned. Ct scan on 11/06/15 shows development of pancreatic pseudocyst - Increase reglan to 24m TID - lipid panel (recently on started on Tricor) - GI consult - [facility] has seen in past  AKI: Cr 2.25. Baseline, 1.0. Likely from ongoing DM injury. Nml BUN - IVF, BMET - Hold nephrotoxic medications  Rash: ongoing for months. Multiple treatments in the past including for scabies w/o imrpovement. Most recently started on steroid cream w/ improvement.  - Hydrocortisone oint BID x5-7 days then DC  HTN: - Hold Lisinopril for 24 hrs - Hydralazine prn   Code Status: FULL  DVT Prophylaxis: Lovenox Family Communication: none Disposition Plan: Pending Improvement    MERRELL, DAVID JLenna Sciara MD Family Medicine [facility] [email or web address] Password [email or web address]

## 2015-11-28 ENCOUNTER — Inpatient Hospital Stay: Payer: Self-pay | Admitting: Internal Medicine

## 2015-11-28 DIAGNOSIS — E0810 Diabetes mellitus due to underlying condition with ketoacidosis without coma: Secondary | ICD-10-CM

## 2015-11-28 DIAGNOSIS — R935 Abnormal findings on diagnostic imaging of other abdominal regions, including retroperitoneum: Secondary | ICD-10-CM

## 2015-11-28 DIAGNOSIS — K863 Pseudocyst of pancreas: Secondary | ICD-10-CM | POA: Insufficient documentation

## 2015-11-28 DIAGNOSIS — E1165 Type 2 diabetes mellitus with hyperglycemia: Secondary | ICD-10-CM

## 2015-11-28 DIAGNOSIS — Z794 Long term (current) use of insulin: Secondary | ICD-10-CM

## 2015-11-28 DIAGNOSIS — N179 Acute kidney failure, unspecified: Secondary | ICD-10-CM

## 2015-11-28 DIAGNOSIS — I10 Essential (primary) hypertension: Secondary | ICD-10-CM

## 2015-11-28 LAB — BASIC METABOLIC PANEL
ANION GAP: 12 (ref 5–15)
BUN: 10 mg/dL (ref 6–20)
CALCIUM: 8 mg/dL — AB (ref 8.9–10.3)
CO2: 29 mmol/L (ref 22–32)
CREATININE: 1.11 mg/dL (ref 0.61–1.24)
Chloride: 96 mmol/L — ABNORMAL LOW (ref 101–111)
GFR calc Af Amer: >60 mL/min (ref >60)
GLUCOSE: 180 mg/dL — AB (ref 65–99)
Potassium: 2.7 mmol/L — CL (ref 3.5–5.1)
Sodium: 137 mmol/L (ref 135–145)

## 2015-11-28 LAB — GLUCOSE, CAPILLARY
GLUCOSE-CAPILLARY: 171 mg/dL — AB (ref 65–99)
GLUCOSE-CAPILLARY: 174 mg/dL — AB (ref 65–99)
GLUCOSE-CAPILLARY: 168 mg/dL — AB (ref 65–99)
GLUCOSE-CAPILLARY: 113 mg/dL — AB (ref 65–99)
GLUCOSE-CAPILLARY: 117 mg/dL — AB (ref 65–99)
GLUCOSE-CAPILLARY: 121 mg/dL — AB (ref 65–99)
Glucose-Capillary: 102 mg/dL — ABNORMAL HIGH (ref 65–99)
Glucose-Capillary: 112 mg/dL — ABNORMAL HIGH (ref 65–99)
Glucose-Capillary: 114 mg/dL — ABNORMAL HIGH (ref 65–99)
Glucose-Capillary: 120 mg/dL — ABNORMAL HIGH (ref 65–99)
Glucose-Capillary: 142 mg/dL — ABNORMAL HIGH (ref 65–99)
Glucose-Capillary: 153 mg/dL — ABNORMAL HIGH (ref 65–99)
Glucose-Capillary: 163 mg/dL — ABNORMAL HIGH (ref 65–99)
Glucose-Capillary: 182 mg/dL — ABNORMAL HIGH (ref 65–99)
Glucose-Capillary: 63 mg/dL — ABNORMAL LOW (ref 65–99)

## 2015-11-28 LAB — CBC
HEMATOCRIT: 31.9 % — AB (ref 39.0–52.0)
Hemoglobin: 10.1 g/dL — ABNORMAL LOW (ref 13.0–17.0)
MCH: 25.5 pg — ABNORMAL LOW (ref 26.0–34.0)
MCHC: 31.7 g/dL (ref 30.0–36.0)
MCV: 80.6 fL (ref 78.0–100.0)
PLATELETS: 322 10*3/uL (ref 150–400)
RBC: 3.96 MIL/uL — ABNORMAL LOW (ref 4.22–5.81)
RDW: 13.6 % (ref 11.5–15.5)
WBC: 5.8 10*3/uL (ref 4.0–10.5)

## 2015-11-28 LAB — COMPREHENSIVE METABOLIC PANEL
ALBUMIN: 2.3 g/dL — AB (ref 3.5–5.0)
ALT: 8 U/L — ABNORMAL LOW (ref 17–63)
ANION GAP: 10 (ref 5–15)
AST: 14 U/L — AB (ref 15–41)
Alkaline Phosphatase: 60 U/L (ref 38–126)
BILIRUBIN TOTAL: 0.3 mg/dL (ref 0.3–1.2)
BUN: 8 mg/dL (ref 6–20)
CHLORIDE: 97 mmol/L — AB (ref 101–111)
CO2: 31 mmol/L (ref 22–32)
Calcium: 8.2 mg/dL — ABNORMAL LOW (ref 8.9–10.3)
Creatinine, Ser: 1.09 mg/dL (ref 0.61–1.24)
GFR calc Af Amer: >60 mL/min (ref >60)
GFR calc non Af Amer: >60 mL/min (ref >60)
GLUCOSE: 144 mg/dL — AB (ref 65–99)
POTASSIUM: 3.1 mmol/L — AB (ref 3.5–5.1)
SODIUM: 138 mmol/L (ref 135–145)
TOTAL PROTEIN: 5.5 g/dL — AB (ref 6.5–8.1)

## 2015-11-28 LAB — LIPASE, BLOOD: Lipase: 29 U/L (ref 11–51)

## 2015-11-28 MED ORDER — SODIUM CHLORIDE 0.9 % IV SOLN
500.0000 mg | Freq: Four times a day (QID) | INTRAVENOUS | Status: DC
  Administered 2015-11-28 – 2015-12-01 (×10): 500 mg via INTRAVENOUS
  Filled 2015-11-28 (×12): qty 500

## 2015-11-28 MED ORDER — DEXTROSE 50 % IV SOLN
25.0000 mL | INTRAVENOUS | Status: AC
  Administered 2015-11-28: 25 mL via INTRAVENOUS

## 2015-11-28 MED ORDER — POTASSIUM CHLORIDE 10 MEQ/100ML IV SOLN
10.0000 meq | INTRAVENOUS | Status: AC
  Administered 2015-11-28 (×4): 10 meq via INTRAVENOUS
  Filled 2015-11-28 (×4): qty 100

## 2015-11-28 MED ORDER — INSULIN GLARGINE 100 UNIT/ML ~~LOC~~ SOLN
35.0000 [IU] | Freq: Every day | SUBCUTANEOUS | Status: DC
  Administered 2015-11-28 – 2015-12-01 (×5): 35 [IU] via SUBCUTANEOUS
  Filled 2015-11-28 (×7): qty 0.35

## 2015-11-28 MED ORDER — INSULIN ASPART 100 UNIT/ML ~~LOC~~ SOLN
0.0000 [IU] | SUBCUTANEOUS | Status: DC
  Administered 2015-11-29: 4 [IU] via SUBCUTANEOUS
  Administered 2015-11-29 – 2015-11-30 (×2): 3 [IU] via SUBCUTANEOUS
  Administered 2015-12-01: 4 [IU] via SUBCUTANEOUS
  Administered 2015-12-01 – 2015-12-02 (×2): 3 [IU] via SUBCUTANEOUS

## 2015-11-28 MED ORDER — HYDROMORPHONE HCL 1 MG/ML IJ SOLN
1.0000 mg | INTRAMUSCULAR | Status: DC | PRN
  Administered 2015-11-28 – 2015-12-01 (×23): 1 mg via INTRAVENOUS
  Filled 2015-11-28 (×23): qty 1

## 2015-11-28 MED ORDER — INSULIN ASPART 100 UNIT/ML ~~LOC~~ SOLN: 0.0000 [IU] | Freq: Three times a day (TID) | SUBCUTANEOUS | Status: DC

## 2015-11-28 MED ORDER — DEXTROSE 50 % IV SOLN
INTRAVENOUS | Status: AC
  Administered 2015-11-28: 25 mL
  Filled 2015-11-28: qty 50

## 2015-11-28 MED ORDER — POTASSIUM CHLORIDE 2 MEQ/ML IV SOLN
INTRAVENOUS | Status: DC
  Administered 2015-11-28 – 2015-11-29 (×2): via INTRAVENOUS
  Filled 2015-11-28 (×4): qty 1000

## 2015-11-28 NOTE — Progress Notes (Signed)
Initial Nutrition Assessment  DOCUMENTATION CODES:   Obesity unspecified  INTERVENTION:   -RD will follow for diet advancement and supplement as appropriate  NUTRITION DIAGNOSIS:   Inadequate oral intake related to altered GI function as evidenced by NPO status.  GOAL:   Patient will meet greater than or equal to 90% of their needs  MONITOR:   Diet advancement, Labs, Weight trends, Skin, I & O's  REASON FOR ASSESSMENT:   Malnutrition Screening Tool    ASSESSMENT:   Pt presents with abdominal and back pain, nausea and vomiting. Chest pain, with vomiting and chills, he cannot even keep liquids down. He was here 2/8-2/14/17 with chronic pancreatitis, thought to be secondary to high triglycerides, and some possible ETOH use. He has been here 1/11-1/21/17, 2/4-11/12/15, and again 2/7-2/14/17 with the same issues. In addition to this he also has and is being treated for Gastroparesis with Reglan. He has not had a gastric emptying study so far. He says he has had abdominal pain most of the time since onset in January. He denies ETOH use since January and his Tox screen is negative for ETOH.  Pt admitted with DKA, abdominal pain/ nausea/vomiting, and acute pancreatitis.   Surgical service following. Per MD notes, CT scan revealed microperforations in the stomach vs duodenum.   Spoke with RN prior to visit, who reveals that pt was just administered pain medication recently.   Pt lying in bed at time of visit. Hx provided by pt and pt mother, who was on speakerphone at time of visit. Pt confirms poor po intake and weight loss over the past 1-2 months. He reports he has been unable to keep many foods and liquids down ("I'll vomit or they'll come right back up"). Pt was consuming a full liquid diet of water and soups. Per pt mother, pt was unable to keep down water PTA.   Pt reports UBW was 270#. He endorses a progressive weight loss since January. Noted a 8.1% wt loss over the past  month, which pt attributes to poor PO intake. Pt also reveals that he has struggled with DM control; he shares with this RD that his blood sugars usually range above 200.   Nutrition-Focused physical exam completed. Findings are no fat depletion, mild muscle depletion, and no edema. Pt reveals extreme decline in activity level due to weakness.   Pt understands rationale for NPO, but is amenable to trying supplements once diet is advanced. He recalls consuming Boost Breeze and Glucerna supplements during previous admissions.   Labs reviewed: K: 3.1, AST/ALT: 14/8, CBGS: 113-174. Last Hgb A1c: 12.4 (10/17/15). DM has historically poorly controlled; Hgb A1c ranging from 10.3-12.4.   Diet Order:  Diet NPO time specified Except for: Ice Chips  Skin:  Reviewed, no issues  Last BM:  PTA  Height:   Ht Readings from Last 1 Encounters:  11/27/15  (1.753 m)    Weight:   Wt Readings from Last 1 Encounters:  11/27/15 226 lb 3.1 oz (102.6 kg)    Ideal Body Weight:  72.7 kg  BMI:  Body mass index is 33.39 kg/(m^2).  Estimated Nutritional Needs:   Kcal:  1900-2100  Protein:  100-115 grams  Fluid:  1.9-2.1 L  EDUCATION NEEDS:   Education needs addressed  Aurelie Dicenzo A. Mayford Knife, RD, LDN, CDE Pager: [phone] After hours Pager: [phone]

## 2015-11-28 NOTE — Care Management Note (Signed)
Case Management Note  Patient Details  Name: Brent Costa MRN: [id number] Date of Birth: October 13, 1990  Subjective/Objective:                 Spoke with patient in the room. He admits that he has not been compliant with DM regime, and has not made it a priority. Discussed readmissions and what he needed to do prevent further hospitalizations related to DM. Patient is compliant with going to [facility] and getting medications and is able to afford them from there, but states that he does not have a meter and has been using his girlfriend's mother's. Discussed [employer] meter from [employer] and included it in AVS, F/U apt made w/ [facility]. Patient agreed that he needed to make DM a priority and that he realizes that it is up to him to do so. Patient appreciated assistance with meter and appointment and denied any other barriers to care.   Action/Plan:  No other CM needs identified.  Expected Discharge Date:                  Expected Discharge Plan:  Home/Self Care  In-House Referral:     Discharge planning Services  CM Consult, [facility]igent [facility]  Post Acute Care Choice:    Choice offered to:  Patient  DME Arranged:    DME Agency:     HH Arranged:    HH Agency:     Status of Service:  Completed, signed off  Medicare Important Message Given:    Date Medicare IM Given:    Medicare IM give by:    Date Additional Medicare IM Given:    Additional Medicare Important Message give by:     If discussed at Long Length of Stay Meetings, dates discussed:    Additional Comments:  Lawerance Sabal, RN 11/28/2015, 11:51 AM

## 2015-11-28 NOTE — Progress Notes (Signed)
PROGRESS NOTE  Brent Costa ZOX:[id number] DOB: [birth year] DOA: 11/27/2015 PCP: Jeanann Lewandowsky, MD Outpatient Specialists:    LOS: 1 day   Brief Narrative: 25 yo M with recurrent pancreatitis admitted on 2/22 with abdominal pain, nausea, vomiting, found to be in DKA. CT scan of abdomen and pelvis on admission showed microperforations presumably of the stomach or duodenum. Gastroenterology and general surgery for consulted on patient's admission.  Assessment & Plan: Active Problems:   Uncontrolled diabetes mellitus without complication (HCC)   Essential hypertension   Nausea and vomiting   Pancreatic pseudocyst   AKI (acute kidney injury) (HCC)   DKA (diabetic ketoacidoses) (HCC)   Rash   DKA - Patient was initiated on insulin drip on admission as well as IV fluids, his DKA resolved, he is now off of insulin infusion and he was restarted on 35 units of Lantus daily. - To monitor CBGs, sliding scale insulin. Currently he is nothing by mouth  Acute on chronic pancreatitis with gastric and duodenal microperforations, as well as pancreatic pseudocyst - CT scan of the abdomen and pelvis yesterday as below - Appreciate general surgery and gastroenterology consultation - Keep nothing by mouth for now, continue IV fluids  - Pain control and supportive treatment  - Continue Primaxin  Acute kidney injury - Like in the setting of dehydration with nausea vomiting and poor by mouth intake over the last few days - Improved with IV fluids, creatinine this morning is 1.09  Hypokalemia - In the setting of DKA treated with insulin as well as poor by mouth intake - Replete as needed  Hypertension - Hydralazine as needed  Rash - ongoing for months.  - Multiple treatments in the past including for scabies w/o imrpovement.  - Most recently started on steroid cream w/ improvement.  - Hydrocortisone oint BID x5-7 days then DC  Hypertriglyceridemia - As high as 3000 at the beginning  of January, now his triglycerides are 223.   DVT prophylaxis: Lovenox Code Status: Full Family Communication: no family bedside Disposition Plan: home when ready  Barriers for discharge: acute pancreatitis  Consultants:   General surgery   GI  Procedures:   None   Antimicrobials:  Imipenem 2/22 >>   Subjective: - Feels improved this morning, less abdominal pain, less nausea and no further episodes of vomiting   Objective: Filed Vitals:   11/27/15 2028 11/27/15 2029 11/28/15 0631 11/28/15 1054  BP: 115/54  127/45 135/63  Pulse: 88  93 95  Temp: 101.4 F (38.6 C) 99 F (37.2 C) 100.2 F (37.9 C) 98.2 F (36.8 C)  TempSrc: Oral Oral Oral Oral  Resp: 13  16 16   Height: 5\' 9"  (1.753 m)     Weight: 102.6 kg (226 lb 3.1 oz)     SpO2: 96%  98% 96%    Intake/Output Summary (Last 24 hours) at 11/28/15 1223 Last data filed at 11/28/15 0854  Gross per 24 hour  Intake      0 ml  Output    400 ml  Net   -400 ml   Filed Weights   11/27/15 2028  Weight: 102.6 kg (226 lb 3.1 oz)    Examination: BP 135/63 mmHg  Pulse 95  Temp(Src) 98.2 F (36.8 C) (Oral)  Resp 16  Ht 5\' 9"  (1.753 m)  Wt 102.6 kg (226 lb 3.1 oz)  BMI 33.39 kg/m2  SpO2 96% General exam: NAD Respiratory system: Clear. No increased work of breathing. No wheezing, no crackles Cardiovascular system:  regular rate and rhythm, no murmurs, gallops. No JVD. No peripheral edema.  Gastrointestinal system: Abdomen is nondistended, soft and tender to palpation throughout, mainly in the epigastric area. Normal bowel sounds heard. Central nervous system: AxOx3. No focal deficits Extremities: No clubbing/cyanosis   Data Reviewed: I have personally reviewed following labs and imaging studies  CBC:  Recent Labs Lab 11/26/15 2349 11/27/15 0937 11/28/15 0503  WBC 9.9 6.3 5.8  HGB 13.6 10.9* 10.1*  HCT 41.1 33.6* 31.9*  MCV 80.6 82.4 80.6  PLT 503* 332 322   Basic Metabolic Panel:  Recent Labs Lab  11/27/15 0937 11/27/15 1409 11/27/15 1640 11/27/15 2120 11/28/15 0039 11/28/15 0503  NA 137 137 136 139 137 138  K 3.3* 4.0 3.3* 2.8* 2.7* 3.1*  CL 88* 91* 93* 93* 96* 97*  CO2 36* 32 27 33* 29 31  GLUCOSE 280* 140* 149* 142* 180* 144*  BUN CREATININE 1.82* 1.53* 1.38* 1.32* 1.11 1.09  CALCIUM 8.3* 8.3* 8.1* 8.3* 8.0* 8.2*  MG 1.8  --   --   --   --   --   PHOS 4.1  --   --   --   --   --    GFR: Estimated Creatinine Clearance: 124.5 mL/min (by C-G formula based on Cr of 1.09). Liver Function Tests:  Recent Labs Lab 11/26/15 2349 11/28/15 0503  AST 13* 14*  ALT 10* 8*  ALKPHOS 105 60  BILITOT 1.0 0.3  PROT 8.5* 5.5*  ALBUMIN 3.7 2.3*    Recent Labs Lab 11/26/15 2349 11/27/15 1640 11/28/15 0503  LIPASE 35 30 29   No results for input(s): AMMONIA in the last 168 hours. Coagulation Profile: No results for input(s): INR, PROTIME in the last 168 hours. Cardiac Enzymes: No results for input(s): CKTOTAL, CKMB, CKMBINDEX, TROPONINI in the last 168 hours. BNP (last 3 results) No results for input(s): PROBNP in the last 8760 hours. HbA1C: No results for input(s): HGBA1C in the last 72 hours. CBG:  Recent Labs Lab 11/28/15 0317 11/28/15 0423 11/28/15 0548 11/28/15 0759 11/28/15 1002  GLUCAP 174* 168* 120* 113* 117*   Lipid Profile:  Recent Labs  11/27/15 0937  CHOL 132  HDL 15*  LDLCALC 72  TRIG 161*  CHOLHDL 8.8   Thyroid Function Tests: No results for input(s): TSH, T4TOTAL, FREET4, T3FREE, THYROIDAB in the last 72 hours. Anemia Panel: No results for input(s): VITAMINB12, FOLATE, FERRITIN, TIBC, IRON, RETICCTPCT in the last 72 hours. Urine analysis:    Component Value Date/Time   COLORURINE YELLOW 11/27/2015 1542   APPEARANCEUR CLOUDY* 11/27/2015 1542   LABSPEC 1.039* 11/27/2015 1542   PHURINE 7.0 11/27/2015 1542   GLUCOSEU NEGATIVE 11/27/2015 1542   HGBUR MODERATE* 11/27/2015 1542   BILIRUBINUR NEGATIVE 11/27/2015 1542    BILIRUBINUR neg 06/25/2015 1615   KETONESUR NEGATIVE 11/27/2015 1542   PROTEINUR NEGATIVE 11/27/2015 1542   PROTEINUR neg 06/25/2015 1615   UROBILINOGEN 0.2 06/25/2015 1615   UROBILINOGEN 1.0 08/30/2014 2308   NITRITE NEGATIVE 11/27/2015 1542   NITRITE neg 06/25/2015 1615   LEUKOCYTESUR NEGATIVE 11/27/2015 1542   Sepsis Labs: Invalid input(s): PROCALCITONIN, LACTICIDVEN  No results found for this or any previous visit (from the past 240 hour(s)).    Radiology Studies: Ct Abdomen Pelvis W Contrast  11/27/2015  ADDENDUM REPORT: 11/27/2015 15:14 ADDENDUM: Findings conveyed toBrian Miller, and heon 11/27/2015  at14:14. Electronically Signed   By: Genevive Bi M.D.   On: 11/27/2015 15:14  11/27/2015  CLINICAL DATA:  Pancreatitis with pseudocyst formation. Persists abdominal pain. EXAM: CT ABDOMEN AND PELVIS WITH CONTRAST TECHNIQUE: Multidetector CT imaging of the abdomen and pelvis was performed using the standard protocol following bolus administration of intravenous contrast. CONTRAST:  80mL OMNIPAQUE IOHEXOL 300 MG/ML  SOLN COMPARISON:  CT 11/06/2015, 10/21/2015 FINDINGS: Lower chest: Lung bases are clear. Hepatobiliary: No focal hepatic lesion. No intrahepatic biliary duct dilatation. The common bile duct appears normal. The gallbladder is normal. Pancreas: Body and tail the pancreas are normal. The head of the pancreas is indistinct. There is extensive inflammation surrounding head of the pancreas. Small gas locules adjacent to the head of the pancreas. Pseudocyst inferior to the pancreatic head and third portion the duodenum is decreased in size measuring 4.7 by 1.7 cm decreased from 6.7 by 2.5 cm (image 53, series 2). There is extensive inflammation along the second portion duodenum. There small gas locules in the porta hepatis. Spleen: Normal spleen Adrenals/urinary tract: Adrenal glands and kidneys are normal. The ureters and bladder normal. Stomach/Bowel: Proximal stomach is normal. There  is inflammation in the gastric antrum/ pyloric region. Extensive inflammation along the second portion the duodenum, gallbladder fossa and extending into the Morrison's pouch along the ascending colon. Several small loculated gas along the second portion duodenum, gallbladder fossa, Morrison's pouch, and ascending colon. No large volume intraperitoneal free air. Vascular/Lymphatic: Abdominal aorta is normal caliber. There is no retroperitoneal or periportal lymphadenopathy. No pelvic lymphadenopathy. Reproductive: Prostate normal. Small amount free fluid in the pelvis. Free fluid is similar prior. Other: Small amount free fluid the pelvis Musculoskeletal: No aggressive osseous lesion. IMPRESSION: 1. Multiple small loculations of gas along the second portion duodenum and extending into Morrison's pouch. Findings consistent with MICRO PERFORATION presumably of the stomach or duodenum and less likely hepatic flexure of the colon. Differential would include post pancreatitis perforation, necrotizing infection versus iatrogenic (query recent endoscopy). 2. Extensive inflammation along the second portion duodenum, head of the pancreas, and Morrison's pouch. Degree of inflammation is similar to comparison exam. The fluid collections are decreased in volume. 3. Inflammation along the hepatic flexure of the colon likely secondary to the prior pancreatitis. Electronically Signed: By: Genevive Bi M.D. On: 11/27/2015 13:56     Scheduled Meds: . enoxaparin (LOVENOX) injection  40 mg Subcutaneous Q24H  . feeding supplement (ENSURE ENLIVE)  237 mL Oral BID BM  . fenofibrate  54 mg Oral Daily  . hydrocortisone   Topical BID  . imipenem-cilastatin  500 mg Intravenous 3 times per day  . insulin aspart  0-20 Units Subcutaneous Q4H  . insulin glargine  35 Units Subcutaneous QHS  . metoCLOPramide (REGLAN) injection  10 mg Intravenous 3 times per day  . ondansetron  4 mg Intravenous Q6H  . pantoprazole (PROTONIX) IV   40 mg Intravenous Q12H  . sodium chloride flush  3 mL Intravenous Q12H   Continuous Infusions: . sodium chloride 1,000 mL (11/28/15 1101)     Pamella Pert, MD, PhD [facility] Pager [phone]  If 7PM-7AM, please contact night-coverage [email or web address] Password [facility] 11/28/2015, 12:23 PM

## 2015-11-28 NOTE — Progress Notes (Signed)
Subjective: He still has some pain, mostly upper abdomen.  But still some below the umbilicus, much better than last PM.    Objective: Vital signs in last 24 hours: Temp:  [99 F (37.2 C)-101.4 F (38.6 C)] 100.2 F (37.9 C) (02/23 0631) Pulse Rate:  [87-98] 93 (02/23 0631) Resp:  [13-31] 16 (02/23 0631) BP: (115-127)/(45-64) 127/45 mmHg (02/23 0631) SpO2:  [95 %-99 %] 98 % (02/23 0631) Weight:  [102.6 kg (226 lb 3.1 oz)] 102.6 kg (226 lb 3.1 oz) (02/22 2028)   IV's not recorded, Only 400 urine recorded Temp up to 100.2 this AM 0300, TM 101.4 2028 hrs. Off insulin drip K+ 3.1, LFT's OK WBC is normal, H/H is down some  Intake/Output from previous day: 02/22 0701 - 02/23 0700 In: -  Out: 400 [Urine:400] Intake/Output this shift:    General appearance: alert, cooperative and no distress GI: soft, tender mid upper abdomen, less so below the umbilicus which was also very tender last PM..  No BS  Lab Results:   Recent Labs  11/27/15 0937 11/28/15 0503  WBC 6.3 5.8  HGB 10.9* 10.1*  HCT 33.6* 31.9*  PLT 332 322    BMET  Recent Labs  11/28/15 0039 11/28/15 0503  NA 137 138  K 2.7* 3.1*  CL 96* 97*  CO2 29 31  GLUCOSE 180* 144*  BUN 10 8  CREATININE 1.11 1.09  CALCIUM 8.0* 8.2*   PT/INR No results for input(s): LABPROT, INR in the last 72 hours.   Recent Labs Lab 11/26/15 2349 11/28/15 0503  AST 13* 14*  ALT 10* 8*  ALKPHOS 105 60  BILITOT 1.0 0.3  PROT 8.5* 5.5*  ALBUMIN 3.7 2.3*     Lipase     Component Value Date/Time   LIPASE 29 11/28/2015 0503     Studies/Results: Ct Abdomen Pelvis W Contrast  11/27/2015  ADDENDUM REPORT: 11/27/2015 15:14 ADDENDUM: Findings conveyed toBrian Miller, and heon 11/27/2015  at14:14. Electronically Signed   By: Genevive Bi M.D.   On: 11/27/2015 15:14  11/27/2015  CLINICAL DATA:  Pancreatitis with pseudocyst formation. Persists abdominal pain. EXAM: CT ABDOMEN AND PELVIS WITH CONTRAST TECHNIQUE:  Multidetector CT imaging of the abdomen and pelvis was performed using the standard protocol following bolus administration of intravenous contrast. CONTRAST:  80mL OMNIPAQUE IOHEXOL 300 MG/ML  SOLN COMPARISON:  CT 11/06/2015, 10/21/2015 FINDINGS: Lower chest: Lung bases are clear. Hepatobiliary: No focal hepatic lesion. No intrahepatic biliary duct dilatation. The common bile duct appears normal. The gallbladder is normal. Pancreas: Body and tail the pancreas are normal. The head of the pancreas is indistinct. There is extensive inflammation surrounding head of the pancreas. Small gas locules adjacent to the head of the pancreas. Pseudocyst inferior to the pancreatic head and third portion the duodenum is decreased in size measuring 4.7 by 1.7 cm decreased from 6.7 by 2.5 cm (image 53, series 2). There is extensive inflammation along the second portion duodenum. There small gas locules in the porta hepatis. Spleen: Normal spleen Adrenals/urinary tract: Adrenal glands and kidneys are normal. The ureters and bladder normal. Stomach/Bowel: Proximal stomach is normal. There is inflammation in the gastric antrum/ pyloric region. Extensive inflammation along the second portion the duodenum, gallbladder fossa and extending into the Morrison's pouch along the ascending colon. Several small loculated gas along the second portion duodenum, gallbladder fossa, Morrison's pouch, and ascending colon. No large volume intraperitoneal free air. Vascular/Lymphatic: Abdominal aorta is normal caliber. There is no retroperitoneal or periportal  lymphadenopathy. No pelvic lymphadenopathy. Reproductive: Prostate normal. Small amount free fluid in the pelvis. Free fluid is similar prior. Other: Small amount free fluid the pelvis Musculoskeletal: No aggressive osseous lesion. IMPRESSION: 1. Multiple small loculations of gas along the second portion duodenum and extending into Morrison's pouch. Findings consistent with MICRO PERFORATION  presumably of the stomach or duodenum and less likely hepatic flexure of the colon. Differential would include post pancreatitis perforation, necrotizing infection versus iatrogenic (query recent endoscopy). 2. Extensive inflammation along the second portion duodenum, head of the pancreas, and Morrison's pouch. Degree of inflammation is similar to comparison exam. The fluid collections are decreased in volume. 3. Inflammation along the hepatic flexure of the colon likely secondary to the prior pancreatitis. Electronically Signed: By: Genevive Bi M.D. On: 11/27/2015 13:56    Medications: . enoxaparin (LOVENOX) injection  40 mg Subcutaneous Q24H  . feeding supplement (ENSURE ENLIVE)  237 mL Oral BID BM  . fenofibrate  54 mg Oral Daily  . hydrocortisone   Topical BID  . imipenem-cilastatin  500 mg Intravenous 3 times per day  . insulin aspart  0-20 Units Subcutaneous Q4H  . insulin glargine  35 Units Subcutaneous QHS  . metoCLOPramide (REGLAN) injection  10 mg Intravenous 3 times per day  . ondansetron  4 mg Intravenous Q6H  . pantoprazole (PROTONIX) IV  40 mg Intravenous Q12H  . sodium chloride flush  3 mL Intravenous Q12H   . sodium chloride Stopped (11/27/15 0915)   Assessment/Plan Recurrent pancreatitis  Possible Microperforation of the stomach or duodenum with significant inflammatory changes second portion of the duodenum, head of the pancreas, hepatic flexure of the colon. Diabetes, not sure if it's type I or II (HA1C 12.4 - 10/17/15) Hypertension Dyslipidemia with elevated triglycerides Hypokalemia  Antibiotics:  Day 2 imipenem-cilastatin  DVT: Lovenox/SCD  Plan:  Continue conservative medical management.  We will continue to follow.    LOS: 1 day    Brent Costa 11/28/2015

## 2015-11-28 NOTE — Progress Notes (Signed)
Daily Rounding Note  11/28/2015, 11:02 AM  LOS: 1 day   SUBJECTIVE:       Feels better.  Pain not as bad but says po pain meds oxycodone is not helping.  No n/v as he is npo.  For some reason IVF running, though ordered.  Insulin drip discontinued.   OBJECTIVE:         Vital signs in last 24 hours:    Temp:  [98.2 F (36.8 C)-101.4 F (38.6 C)] 98.2 F (36.8 C) (02/23 1054) Pulse Rate:  [87-98] 95 (02/23 1054) Resp:  [13-31] 16 (02/23 1054) BP: (115-135)/(45-64) 135/63 mmHg (02/23 1054) SpO2:  [95 %-99 %] 96 % (02/23 1054) Weight:  [102.6 kg (226 lb 3.1 oz)] 102.6 kg (226 lb 3.1 oz) (02/22 2028)   Filed Weights   11/27/15 2028  Weight: 102.6 kg (226 lb 3.1 oz)  temp max 101.4 BP 120s/40s to 60s Heart Rate in 90s.   General: looks better, sleeping comfortably   Heart: RRR Chest: clear bil.  No labored breathing Abdomen: soft, tender upper mid/right and mid right.  BS present but hypoactive  Extremities: no CCE Neuro/Psych:  Oriented x 3.   Intake/Output from previous day: 02/22 0701 - 02/23 0700 In: -  Out: 400 [Urine:400]  Intake/Output this shift:    Lab Results:  Recent Labs  11/26/15 2349 11/27/15 0937 11/28/15 0503  WBC 9.9 6.3 5.8  HGB 13.6 10.9* 10.1*  HCT 41.1 33.6* 31.9*  PLT 503* 332 322   BMET  Recent Labs  11/27/15 2120 11/28/15 0039 11/28/15 0503  NA 139 137 138  K 2.8* 2.7* 3.1*  CL 93* 96* 97*  CO2 33* 29 31  GLUCOSE 142* 180* 144*  BUN CREATININE 1.32* 1.11 1.09  CALCIUM 8.3* 8.0* 8.2*   LFT  Recent Labs  11/26/15 2349 11/28/15 0503  PROT 8.5* 5.5*  ALBUMIN 3.7 2.3*  AST 13* 14*  ALT 10* 8*  ALKPHOS 105 60  BILITOT 1.0 0.3   PT/INR No results for input(s): LABPROT, INR in the last 72 hours. Hepatitis Panel No results for input(s): HEPBSAG, HCVAB, HEPAIGM, HEPBIGM in the last 72 hours.  Studies/Results: Ct Abdomen Pelvis W Contrast  11/27/2015   ADDENDUM REPORT: 11/27/2015 15:14 ADDENDUM: Findings conveyed toBrian Miller, and heon 11/27/2015  at14:14. Electronically Signed   By: Genevive Bi M.D.   On: 11/27/2015 15:14  11/27/2015  CLINICAL DATA:  Pancreatitis with pseudocyst formation. Persists abdominal pain. EXAM: CT ABDOMEN AND PELVIS WITH CONTRAST TECHNIQUE: Multidetector CT imaging of the abdomen and pelvis was performed using the standard protocol following bolus administration of intravenous contrast. CONTRAST:  80mL OMNIPAQUE IOHEXOL 300 MG/ML  SOLN COMPARISON:  CT 11/06/2015, 10/21/2015 FINDINGS: Lower chest: Lung bases are clear. Hepatobiliary: No focal hepatic lesion. No intrahepatic biliary duct dilatation. The common bile duct appears normal. The gallbladder is normal. Pancreas: Body and tail the pancreas are normal. The head of the pancreas is indistinct. There is extensive inflammation surrounding head of the pancreas. Small gas locules adjacent to the head of the pancreas. Pseudocyst inferior to the pancreatic head and third portion the duodenum is decreased in size measuring 4.7 by 1.7 cm decreased from 6.7 by 2.5 cm (image 53, series 2). There is extensive inflammation along the second portion duodenum. There small gas locules in the porta hepatis. Spleen: Normal spleen Adrenals/urinary tract: Adrenal glands and kidneys are normal. The ureters and bladder  normal. Stomach/Bowel: Proximal stomach is normal. There is inflammation in the gastric antrum/ pyloric region. Extensive inflammation along the second portion the duodenum, gallbladder fossa and extending into the Morrison's pouch along the ascending colon. Several small loculated gas along the second portion duodenum, gallbladder fossa, Morrison's pouch, and ascending colon. No large volume intraperitoneal free air. Vascular/Lymphatic: Abdominal aorta is normal caliber. There is no retroperitoneal or periportal lymphadenopathy. No pelvic lymphadenopathy. Reproductive: Prostate  normal. Small amount free fluid in the pelvis. Free fluid is similar prior. Other: Small amount free fluid the pelvis Musculoskeletal: No aggressive osseous lesion. IMPRESSION: 1. Multiple small loculations of gas along the second portion duodenum and extending into Morrison's pouch. Findings consistent with MICRO PERFORATION presumably of the stomach or duodenum and less likely hepatic flexure of the colon. Differential would include post pancreatitis perforation, necrotizing infection versus iatrogenic (query recent endoscopy). 2. Extensive inflammation along the second portion duodenum, head of the pancreas, and Morrison's pouch. Degree of inflammation is similar to comparison exam. The fluid collections are decreased in volume. 3. Inflammation along the hepatic flexure of the colon likely secondary to the prior pancreatitis. Electronically Signed: By: Genevive Bi M.D. On: 11/27/2015 13:56   Scheduled Meds: . enoxaparin (LOVENOX) injection  40 mg Subcutaneous Q24H  . feeding supplement (ENSURE ENLIVE)  237 mL Oral BID BM  . fenofibrate  54 mg Oral Daily  . hydrocortisone   Topical BID  . imipenem-cilastatin  500 mg Intravenous 3 times per day  . insulin aspart  0-20 Units Subcutaneous Q4H  . insulin glargine  35 Units Subcutaneous QHS  . metoCLOPramide (REGLAN) injection  10 mg Intravenous 3 times per day  . ondansetron  4 mg Intravenous Q6H  . pantoprazole (PROTONIX) IV  40 mg Intravenous Q12H  . sodium chloride flush  3 mL Intravenous Q12H   Continuous Infusions: . sodium chloride 1,000 mL (11/28/15 1101)   PRN Meds:.acetaminophen **OR** acetaminophen, hydrALAZINE, oxyCODONE, promethazine, promethazine  ASSESMENT:   * Ongoing severe pancreatitis.  Initially due to increase triglycerides and ETOH.  Has been abstinent and trigs lower on tricor.  Pseudocyst diminished by CT but now with associated gastric vs duodenal vs colonic microperforation. On Imipenem.  On bid iv PPI.  sxs  continue of n/v/pain.   * IDDM. Currently on insulin drip.   * Electrolyte abnormality, supplementation in progress.   * AKI.    PLAN   *  Start the ordered IVF.  Give IV narcotics.  Stay with scheduled Reglan and Zofran for now, may be able to lose the former withiin 24 hours.  *  Consider initiating TNA if unable to start/tolerate po in next 36 hours.   *  Once taking po, should restart on Tricor.     Brent Costa  11/28/2015, 11:02 AM Pager: [phone]

## 2015-11-29 DIAGNOSIS — K859 Acute pancreatitis without necrosis or infection, unspecified: Principal | ICD-10-CM

## 2015-11-29 LAB — CBC
HEMATOCRIT: 30.6 % — AB (ref 39.0–52.0)
Hemoglobin: 10.3 g/dL — ABNORMAL LOW (ref 13.0–17.0)
MCH: 27 pg (ref 26.0–34.0)
MCHC: 33.7 g/dL (ref 30.0–36.0)
MCV: 80.1 fL (ref 78.0–100.0)
Platelets: 293 10*3/uL (ref 150–400)
RBC: 3.82 MIL/uL — ABNORMAL LOW (ref 4.22–5.81)
RDW: 13.6 % (ref 11.5–15.5)
WBC: 4.4 10*3/uL (ref 4.0–10.5)

## 2015-11-29 LAB — COMPREHENSIVE METABOLIC PANEL
ALT: 47 U/L (ref 17–63)
AST: 109 U/L — AB (ref 15–41)
Albumin: 2.4 g/dL — ABNORMAL LOW (ref 3.5–5.0)
Alkaline Phosphatase: 223 U/L — ABNORMAL HIGH (ref 38–126)
Anion gap: 8 (ref 5–15)
CHLORIDE: 99 mmol/L — AB (ref 101–111)
CO2: 30 mmol/L (ref 22–32)
Calcium: 8.6 mg/dL — ABNORMAL LOW (ref 8.9–10.3)
Creatinine, Ser: 0.76 mg/dL (ref 0.61–1.24)
GFR calc Af Amer: >60 mL/min (ref >60)
Glucose, Bld: 94 mg/dL (ref 65–99)
POTASSIUM: 3 mmol/L — AB (ref 3.5–5.1)
Sodium: 137 mmol/L (ref 135–145)
Total Bilirubin: 1.2 mg/dL (ref 0.3–1.2)
Total Protein: 6.2 g/dL — ABNORMAL LOW (ref 6.5–8.1)

## 2015-11-29 LAB — GLUCOSE, CAPILLARY
GLUCOSE-CAPILLARY: 100 mg/dL — AB (ref 65–99)
GLUCOSE-CAPILLARY: 131 mg/dL — AB (ref 65–99)
GLUCOSE-CAPILLARY: 167 mg/dL — AB (ref 65–99)
GLUCOSE-CAPILLARY: 92 mg/dL (ref 65–99)
Glucose-Capillary: 115 mg/dL — ABNORMAL HIGH (ref 65–99)
Glucose-Capillary: 119 mg/dL — ABNORMAL HIGH (ref 65–99)
Glucose-Capillary: 93 mg/dL (ref 65–99)

## 2015-11-29 MED ORDER — POTASSIUM CHLORIDE 2 MEQ/ML IV SOLN
INTRAVENOUS | Status: DC
  Administered 2015-11-29 – 2015-12-02 (×8): via INTRAVENOUS
  Filled 2015-11-29 (×15): qty 1000

## 2015-11-29 MED ORDER — ENOXAPARIN SODIUM 60 MG/0.6ML ~~LOC~~ SOLN
50.0000 mg | SUBCUTANEOUS | Status: DC
  Administered 2015-11-30 – 2015-12-02 (×3): 50 mg via SUBCUTANEOUS
  Filled 2015-11-29 (×3): qty 0.6

## 2015-11-29 NOTE — Progress Notes (Signed)
Daily Rounding Note  11/29/2015, 10:08 AM  LOS: 2 days   SUBJECTIVE:       Used 4 mg Dilaudid, 10 mg oxycodone total 2/23; 3 mg thus far today.  Pt says he feels great,  Pain much better.  No nausea or emesis.  Passing flatus.  Appetite returning.   OBJECTIVE:         Vital signs in last 24 hours:    Temp:  [98.2 F (36.8 C)-99.3 F (37.4 C)] 98.2 F (36.8 C) (02/24 0448) Pulse Rate:  [86-95] 86 (02/24 0448) Resp:  [16-18] 18 (02/24 0448) BP: (135-145)/(61-72) 145/61 mmHg (02/24 0448) SpO2:  [94 %-100 %] 94 % (02/24 0448) Last BM Date: 11/25/15 Filed Weights   11/27/15 2028  Weight: 102.6 kg (226 lb 3.1 oz)   General: looks much better, not looking ill.    Heart: RRR.   Chest: reduced BS but clear.  No cough or labored breathing Abdomen: soft, BS hypoactive.  Minimal tenderness in upper abdomen  Extremities: no CCE Neuro/Psych:  Pleasant, alert, appropriated, relaxed.   Intake/Output from previous day: 02/23 0701 - 02/24 0700 In: 1312.5 [I.V.:1312.5] Out: 200 [Urine:200]  Intake/Output this shift:    Lab Results:  Recent Labs  11/27/15 0937 11/28/15 0503 11/29/15 0540  WBC 6.3 5.8 4.4  HGB 10.9* 10.1* 10.3*  HCT 33.6* 31.9* 30.6*  PLT 332 322 293   BMET  Recent Labs  11/28/15 0039 11/28/15 0503 11/29/15 0540  NA 137 138 137  K 2.7* 3.1* 3.0*  CL 96* 97* 99*  CO2 GLUCOSE 180* 144* 94  BUN 10 8 <5*  CREATININE 1.11 1.09 0.76  CALCIUM 8.0* 8.2* 8.6*   LFT  Recent Labs  11/26/15 2349 11/28/15 0503 11/29/15 0540  PROT 8.5* 5.5* 6.2*  ALBUMIN 3.7 2.3* 2.4*  AST 13* 14* 109*  ALT 10* 8* 47  ALKPHOS 105 60 223*  BILITOT 1.0 0.3 1.2   PT/INR No results for input(s): LABPROT, INR in the last 72 hours. Hepatitis Panel No results for input(s): HEPBSAG, HCVAB, HEPAIGM, HEPBIGM in the last 72 hours.  Studies/Results: Ct Abdomen Pelvis W Contrast  11/27/2015  ADDENDUM  REPORT: 11/27/2015 15:14 ADDENDUM: Findings conveyed toBrian Miller, and heon 11/27/2015  at14:14. Electronically Signed   By: Genevive Bi M.D.   On: 11/27/2015 15:14  11/27/2015  CLINICAL DATA:  Pancreatitis with pseudocyst formation. Persists abdominal pain. EXAM: CT ABDOMEN AND PELVIS WITH CONTRAST TECHNIQUE: Multidetector CT imaging of the abdomen and pelvis was performed using the standard protocol following bolus administration of intravenous contrast. CONTRAST:  80mL OMNIPAQUE IOHEXOL 300 MG/ML  SOLN COMPARISON:  CT 11/06/2015, 10/21/2015 FINDINGS: Lower chest: Lung bases are clear. Hepatobiliary: No focal hepatic lesion. No intrahepatic biliary duct dilatation. The common bile duct appears normal. The gallbladder is normal. Pancreas: Body and tail the pancreas are normal. The head of the pancreas is indistinct. There is extensive inflammation surrounding head of the pancreas. Small gas locules adjacent to the head of the pancreas. Pseudocyst inferior to the pancreatic head and third portion the duodenum is decreased in size measuring 4.7 by 1.7 cm decreased from 6.7 by 2.5 cm (image 53, series 2). There is extensive inflammation along the second portion duodenum. There small gas locules in the porta hepatis. Spleen: Normal spleen Adrenals/urinary tract: Adrenal glands and kidneys are normal. The ureters and bladder normal. Stomach/Bowel: Proximal stomach is normal. There is inflammation in the  gastric antrum/ pyloric region. Extensive inflammation along the second portion the duodenum, gallbladder fossa and extending into the Morrison's pouch along the ascending colon. Several small loculated gas along the second portion duodenum, gallbladder fossa, Morrison's pouch, and ascending colon. No large volume intraperitoneal free air. Vascular/Lymphatic: Abdominal aorta is normal caliber. There is no retroperitoneal or periportal lymphadenopathy. No pelvic lymphadenopathy. Reproductive: Prostate normal. Small  amount free fluid in the pelvis. Free fluid is similar prior. Other: Small amount free fluid the pelvis Musculoskeletal: No aggressive osseous lesion. IMPRESSION: 1. Multiple small loculations of gas along the second portion duodenum and extending into Morrison's pouch. Findings consistent with MICRO PERFORATION presumably of the stomach or duodenum and less likely hepatic flexure of the colon. Differential would include post pancreatitis perforation, necrotizing infection versus iatrogenic (query recent endoscopy). 2. Extensive inflammation along the second portion duodenum, head of the pancreas, and Morrison's pouch. Degree of inflammation is similar to comparison exam. The fluid collections are decreased in volume. 3. Inflammation along the hepatic flexure of the colon likely secondary to the prior pancreatitis. Electronically Signed: By: Genevive Bi M.D. On: 11/27/2015 13:56   Scheduled Meds: . enoxaparin (LOVENOX) injection  40 mg Subcutaneous Q24H  . fenofibrate  54 mg Oral Daily  . hydrocortisone   Topical BID  . imipenem-cilastatin  500 mg Intravenous Q6H  . insulin aspart  0-20 Units Subcutaneous Q4H  . insulin glargine  35 Units Subcutaneous QHS  . metoCLOPramide (REGLAN) injection  10 mg Intravenous 3 times per day  . ondansetron  4 mg Intravenous Q6H  . pantoprazole (PROTONIX) IV  40 mg Intravenous Q12H  . sodium chloride flush  3 mL Intravenous Q12H   Continuous Infusions: . dextrose 5 % and 0.45% NaCl 1,000 mL with potassium chloride 30 mEq infusion     PRN Meds:.acetaminophen **OR** acetaminophen, hydrALAZINE, HYDROmorphone (DILAUDID) injection, promethazine, promethazine   ASSESMENT:   * Ongoing severe pancreatitis. Initially due to increase triglycerides and ETOH. Has been abstinent and trigs lower on tricor. Pseudocyst of initial notice 2/1 diminished by 2/22 CT but now with associated gastric vs duodenal vs colonic microperforation. On Imipenem. On bid iv PPI.   sxs continue of n/v/pain.   * IDDM.   * ongoing hyponatremia, got Potassium 2/23.  Resolved AKI.  *  Normocytic anemia. Hgb stable after initial post hydration drop.      PLAN   *  ? trial of clear liquids?  He looks really good.      Jennye Moccasin  11/29/2015, 10:08 AM Pager: [phone]

## 2015-11-29 NOTE — Progress Notes (Signed)
PROGRESS NOTE  Brent Costa UVO:[id number] DOB: 08-Feb-1991 DOA: 11/27/2015 PCP: Jeanann Lewandowsky, MD Outpatient Specialists:    LOS: 2 days   Brief Narrative: 25 yo M with recurrent pancreatitis admitted on 2/22 with abdominal pain, nausea, vomiting, found to be in DKA. CT scan of abdomen and pelvis on admission showed microperforations presumably of the stomach or duodenum. Gastroenterology and general surgery for consulted on patient's admission.  Assessment & Plan: Active Problems:   Uncontrolled diabetes mellitus without complication (HCC)   Essential hypertension   Nausea and vomiting   Pancreatic pseudocyst   AKI (acute kidney injury) (HCC)   DKA (diabetic ketoacidoses) (HCC)   Rash   Pseudocyst of pancreas   Abnormal CT of the abdomen   DKA - Patient was initiated on insulin drip on admission as well as IV fluids, his DKA resolved, he is now off of insulin infusion and he was restarted on 35 units of Lantus daily. - To monitor CBGs, sliding scale insulin. Currently he is nothing by mouth - CBG is well controlled in the last 24 hours ranging between 9 PM 120  Acute on chronic pancreatitis with gastric and duodenal microperforations, as well as pancreatic pseudocyst - CT scan of the abdomen and pelvis yesterday as below - Appreciate general surgery and gastroenterology consultation - Pain control and supportive treatment  - Continue Primaxin - Continue nothing by mouth, improving today, may be able to give him clears tomorrow   Acute kidney injury - Like in the setting of dehydration with nausea vomiting and poor by mouth intake over the last few days - Improved with IV fluids  Hypokalemia - In the setting of DKA treated with insulin as well as poor by mouth intake - Replete as needed  Hypertension - Hydralazine as needed  Rash - ongoing for months.  - Multiple treatments in the past including for scabies w/o imrpovement.  - Most recently started on  steroid cream w/ improvement.  - Hydrocortisone oint BID x5-7 days then DC  Hypertriglyceridemia - As high as 3000 at the beginning of January, now his triglycerides are 223.   DVT prophylaxis: Lovenox Code Status: Full Family Communication: Brother @ bedside  Disposition Plan: home when ready  Barriers for discharge: acute pancreatitis  Consultants:   General surgery   GI  Procedures:   None   Antimicrobials:  Imipenem 2/22 >>   Subjective: - Feels a lot better, no nausea, pain is tolerable. He is hungry and wants to eat.  Later in the day patient wanted to leave AGAINST MEDICAL ADVICE due to a family emergency as his mother apparently has an asthma exacerbation and he is very concerned for her. I discussed extensively with the patient at bedside regarding against an AMA leave, I discussed with his brother is well, he agreed to stay.  Objective: Filed Vitals:   11/28/15 1427 11/28/15 2123 11/29/15 0448 11/29/15 1333  BP: 138/70 142/72 145/61 148/76  Pulse: 92 86 86 80  Temp: 99.3 F (37.4 C) 98.6 F (37 C) 98.2 F (36.8 C) 97.7 F (36.5 C)  TempSrc: Oral Oral Oral Oral  Resp: Height:      Weight:      SpO2: 98% 100% 94% 99%    Intake/Output Summary (Last 24 hours) at 11/29/15 1617 Last data filed at 11/29/15 1252  Gross per 24 hour  Intake 1312.5 ml  Output    400 ml  Net  912.5 ml   [employer]  11/27/15 2028  Weight: 102.6 kg (226 lb 3.1 oz)    Examination: BP 148/76 mmHg  Pulse 80  Temp(Src) 97.7 F (36.5 C) (Oral)  Resp 16  Ht 5\' 9"  (1.753 m)  Wt 102.6 kg (226 lb 3.1 oz)  BMI 33.39 kg/m2  SpO2 99% General exam: NAD Respiratory system: Clear. No increased work of breathing. No wheezing, no crackles Cardiovascular system: regular rate and rhythm, no murmurs, gallops. No JVD. No peripheral edema.  Gastrointestinal system: Abdomen is nondistended, soft and tender to palpation throughout, mainly in the epigastric area. Normal  bowel sounds heard. Central nervous system: AxOx3. No focal deficits Extremities: No clubbing/cyanosis   Data Reviewed: I have personally reviewed following labs and imaging studies  CBC:  Recent Labs Lab 11/26/15 2349 11/27/15 0937 11/28/15 0503 11/29/15 0540  WBC 9.9 6.3 5.8 4.4  HGB 13.6 10.9* 10.1* 10.3*  HCT 41.1 33.6* 31.9* 30.6*  MCV 80.6 82.4 80.6 80.1  PLT 503* 332 322 293   Basic Metabolic Panel:  Recent Labs Lab 11/27/15 0937  11/27/15 1640 11/27/15 2120 11/28/15 0039 11/28/15 0503 11/29/15 0540  NA 137  < > 136 139 137 138 137  K 3.3*  < > 3.3* 2.8* 2.7* 3.1* 3.0*  CL 88*  < > 93* 93* 96* 97* 99*  CO2 36*  < > 27 33* 29 31 30   GLUCOSE 280*  < > 149* 142* 180* 144* 94  BUN 17  < > 13 10 10 8  <5*  CREATININE 1.82*  < > 1.38* 1.32* 1.11 1.09 0.76  CALCIUM 8.3*  < > 8.1* 8.3* 8.0* 8.2* 8.6*  MG 1.8  --   --   --   --   --   --   PHOS 4.1  --   --   --   --   --   --   < > = values in this interval not displayed. GFR: Estimated Creatinine Clearance: 169.6 mL/min (by C-G formula based on Cr of 0.76). Liver Function Tests:  Recent Labs Lab 11/26/15 2349 11/28/15 0503 11/29/15 0540  AST 13* 14* 109*  ALT 10* 8* 47  ALKPHOS 105 60 223*  BILITOT 1.0 0.3 1.2  PROT 8.5* 5.5* 6.2*  ALBUMIN 3.7 2.3* 2.4*    Recent Labs Lab 11/26/15 2349 11/27/15 1640 11/28/15 0503  LIPASE 35 30 29   No results for input(s): AMMONIA in the last 168 hours. Coagulation Profile: No results for input(s): INR, PROTIME in the last 168 hours. Cardiac Enzymes: No results for input(s): CKTOTAL, CKMB, CKMBINDEX, TROPONINI in the last 168 hours. BNP (last 3 results) No results for input(s): PROBNP in the last 8760 hours. HbA1C: No results for input(s): HGBA1C in the last 72 hours. CBG:  Recent Labs Lab 11/28/15 2239 11/29/15 0010 11/29/15 0446 11/29/15 0750 11/29/15 1205  GLUCAP 121* 131* 115* 92 100*   Lipid Profile:  Recent Labs  11/27/15 0937  CHOL 132    HDL 15*  LDLCALC 72  TRIG 409*  CHOLHDL 8.8   Thyroid Function Tests: No results for input(s): TSH, T4TOTAL, FREET4, T3FREE, THYROIDAB in the last 72 hours. Anemia Panel: No results for input(s): VITAMINB12, FOLATE, FERRITIN, TIBC, IRON, RETICCTPCT in the last 72 hours. Urine analysis:    Component Value Date/Time   COLORURINE YELLOW 11/27/2015 1542   APPEARANCEUR CLOUDY* 11/27/2015 1542   LABSPEC 1.039* 11/27/2015 1542   PHURINE 7.0 11/27/2015 1542   GLUCOSEU NEGATIVE 11/27/2015 1542   HGBUR MODERATE* 11/27/2015 1542  BILIRUBINUR NEGATIVE 11/27/2015 1542   BILIRUBINUR neg 06/25/2015 1615   KETONESUR NEGATIVE 11/27/2015 1542   PROTEINUR NEGATIVE 11/27/2015 1542   PROTEINUR neg 06/25/2015 1615   UROBILINOGEN 0.2 06/25/2015 1615   UROBILINOGEN 1.0 08/30/2014 2308   NITRITE NEGATIVE 11/27/2015 1542   NITRITE neg 06/25/2015 1615   LEUKOCYTESUR NEGATIVE 11/27/2015 1542   Sepsis Labs: Invalid input(s): PROCALCITONIN, LACTICIDVEN  No results found for this or any previous visit (from the past 240 hour(s)).    Radiology Studies: No results found.   Scheduled Meds: . [START ON 11/30/2015] enoxaparin (LOVENOX) injection  50 mg Subcutaneous Q24H  . fenofibrate  54 mg Oral Daily  . hydrocortisone   Topical BID  . imipenem-cilastatin  500 mg Intravenous Q6H  . insulin aspart  0-20 Units Subcutaneous Q4H  . insulin glargine  35 Units Subcutaneous QHS  . metoCLOPramide (REGLAN) injection  10 mg Intravenous 3 times per day  . ondansetron  4 mg Intravenous Q6H  . pantoprazole (PROTONIX) IV  40 mg Intravenous Q12H  . sodium chloride flush  3 mL Intravenous Q12H   Continuous Infusions: . dextrose 5 % and 0.45% NaCl 1,000 mL with potassium chloride 30 mEq infusion 125 mL/hr at 11/29/15 1244   Time spent: 25 minutes, more than 50% at bedside discussing with patient about his AMA decision  Pamella Pert, MD, PhD [facility] Pager [phone]  If 7PM-7AM, please  contact night-coverage [email or web address] Password [facility] 11/29/2015, 4:17 PM

## 2015-11-29 NOTE — Progress Notes (Signed)
Subjective: He continues to improve.  He still has some pain, but over all is feeling better with good progress.    Objective: Vital signs in last 24 hours: Temp:  [98.2 F (36.8 C)-99.3 F (37.4 C)] 98.2 F (36.8 C) (02/24 0448) Pulse Rate:  [86-95] 86 (02/24 0448) Resp:  [16-18] 18 (02/24 0448) BP: (135-145)/(61-72) 145/61 mmHg (02/24 0448) SpO2:  [94 %-100 %] 94 % (02/24 0448) Last BM Date: 11/25/15 NPO 200 urine recorded Afebrile, no further fever K+ 3.0 Albumin is low WBC and H/H are stable Intake/Output from previous day: 02/23 0701 - 02/24 0700 In: 1312.5 [I.V.:1312.5] Out: 200 [Urine:200] Intake/Output this shift:    General appearance: alert, cooperative and no distress GI: soft, still a bit tender over the mid abdomen.  Again it is better than before.  Few BS. LUE is swollen, IV is in this side.  Lab Results:   Recent Labs  11/28/15 0503 11/29/15 0540  WBC 5.8 4.4  HGB 10.1* 10.3*  HCT 31.9* 30.6*  PLT 322 293    BMET  Recent Labs  11/28/15 0503 11/29/15 0540  NA 138 137  K 3.1* 3.0*  CL 97* 99*  CO2 31 30  GLUCOSE 144* 94  BUN 8 <5*  CREATININE 1.09 0.76  CALCIUM 8.2* 8.6*   PT/INR No results for input(s): LABPROT, INR in the last 72 hours.   Recent Labs Lab 11/26/15 2349 11/28/15 0503 11/29/15 0540  AST 13* 14* 109*  ALT 10* 8* 47  ALKPHOS 105 60 223*  BILITOT 1.0 0.3 1.2  PROT 8.5* 5.5* 6.2*  ALBUMIN 3.7 2.3* 2.4*     Lipase     Component Value Date/Time   LIPASE 29 11/28/2015 0503     Studies/Results: Ct Abdomen Pelvis W Contrast  11/27/2015  ADDENDUM REPORT: 11/27/2015 15:14 ADDENDUM: Findings conveyed toBrian Miller, and heon 11/27/2015  at14:14. Electronically Signed   By: Genevive Bi M.D.   On: 11/27/2015 15:14  11/27/2015  CLINICAL DATA:  Pancreatitis with pseudocyst formation. Persists abdominal pain. EXAM: CT ABDOMEN AND PELVIS WITH CONTRAST TECHNIQUE: Multidetector CT imaging of the abdomen and pelvis was  performed using the standard protocol following bolus administration of intravenous contrast. CONTRAST:  80mL OMNIPAQUE IOHEXOL 300 MG/ML  SOLN COMPARISON:  CT 11/06/2015, 10/21/2015 FINDINGS: Lower chest: Lung bases are clear. Hepatobiliary: No focal hepatic lesion. No intrahepatic biliary duct dilatation. The common bile duct appears normal. The gallbladder is normal. Pancreas: Body and tail the pancreas are normal. The head of the pancreas is indistinct. There is extensive inflammation surrounding head of the pancreas. Small gas locules adjacent to the head of the pancreas. Pseudocyst inferior to the pancreatic head and third portion the duodenum is decreased in size measuring 4.7 by 1.7 cm decreased from 6.7 by 2.5 cm (image 53, series 2). There is extensive inflammation along the second portion duodenum. There small gas locules in the porta hepatis. Spleen: Normal spleen Adrenals/urinary tract: Adrenal glands and kidneys are normal. The ureters and bladder normal. Stomach/Bowel: Proximal stomach is normal. There is inflammation in the gastric antrum/ pyloric region. Extensive inflammation along the second portion the duodenum, gallbladder fossa and extending into the Morrison's pouch along the ascending colon. Several small loculated gas along the second portion duodenum, gallbladder fossa, Morrison's pouch, and ascending colon. No large volume intraperitoneal free air. Vascular/Lymphatic: Abdominal aorta is normal caliber. There is no retroperitoneal or periportal lymphadenopathy. No pelvic lymphadenopathy. Reproductive: Prostate normal. Small amount free fluid in the pelvis. Free  fluid is similar prior. Other: Small amount free fluid the pelvis Musculoskeletal: No aggressive osseous lesion. IMPRESSION: 1. Multiple small loculations of gas along the second portion duodenum and extending into Morrison's pouch. Findings consistent with MICRO PERFORATION presumably of the stomach or duodenum and less likely  hepatic flexure of the colon. Differential would include post pancreatitis perforation, necrotizing infection versus iatrogenic (query recent endoscopy). 2. Extensive inflammation along the second portion duodenum, head of the pancreas, and Morrison's pouch. Degree of inflammation is similar to comparison exam. The fluid collections are decreased in volume. 3. Inflammation along the hepatic flexure of the colon likely secondary to the prior pancreatitis. Electronically Signed: By: Genevive Bi M.D. On: 11/27/2015 13:56    Medications: . enoxaparin (LOVENOX) injection  40 mg Subcutaneous Q24H  . fenofibrate  54 mg Oral Daily  . hydrocortisone   Topical BID  . imipenem-cilastatin  500 mg Intravenous Q6H  . insulin aspart  0-20 Units Subcutaneous Q4H  . insulin glargine  35 Units Subcutaneous QHS  . metoCLOPramide (REGLAN) injection  10 mg Intravenous 3 times per day  . ondansetron  4 mg Intravenous Q6H  . pantoprazole (PROTONIX) IV  40 mg Intravenous Q12H  . sodium chloride flush  3 mL Intravenous Q12H    Assessment/Plan Recurrent pancreatitis  Possible Microperforation of the stomach or duodenum with significant inflammatory changes second portion of the duodenum, head of the pancreas, hepatic flexure of the colon. Diabetes, not sure if it's type I or II (HA1C 12.4 - 10/17/15) Hypertension Dyslipidemia with elevated triglycerides Hypokalemia  Antibiotics: Day 2 imipenem-cilastatin  DVT: Lovenox/SCD   Plan:  I think he is making good progress.  Much less tender and feel much better than when he came in.  I will check on when to try clears.       LOS: 2 days    Javon Snee 11/29/2015

## 2015-11-30 DIAGNOSIS — K858 Other acute pancreatitis without necrosis or infection: Secondary | ICD-10-CM

## 2015-11-30 LAB — GLUCOSE, CAPILLARY
GLUCOSE-CAPILLARY: 120 mg/dL — AB (ref 65–99)
GLUCOSE-CAPILLARY: 92 mg/dL (ref 65–99)
Glucose-Capillary: 118 mg/dL — ABNORMAL HIGH (ref 65–99)
Glucose-Capillary: 114 mg/dL — ABNORMAL HIGH (ref 65–99)
Glucose-Capillary: 92 mg/dL (ref 65–99)
Glucose-Capillary: 127 mg/dL — ABNORMAL HIGH (ref 65–99)
Glucose-Capillary: 104 mg/dL — ABNORMAL HIGH (ref 65–99)

## 2015-11-30 MED ORDER — CHLORHEXIDINE GLUCONATE 0.12 % MT SOLN
15.0000 mL | Freq: Two times a day (BID) | OROMUCOSAL | Status: DC
  Administered 2015-11-30 – 2015-12-02 (×5): 15 mL via OROMUCOSAL
  Filled 2015-11-30 (×5): qty 15

## 2015-11-30 MED ORDER — CETYLPYRIDINIUM CHLORIDE 0.05 % MT LIQD
7.0000 mL | Freq: Two times a day (BID) | OROMUCOSAL | Status: DC
  Administered 2015-12-01: 7 mL via OROMUCOSAL

## 2015-11-30 NOTE — Progress Notes (Signed)
     [facility] Progress Note  Subjective:  Feeling better.  Surgery allowing clear liquids today.  Passing flatus.  No nausea or vomiting.  Objective:  Vital signs in last 24 hours: Temp:  [97.7 F (36.5 C)-98.8 F (37.1 C)] 98.8 F (37.1 C) (02/25 0645) Pulse Rate:  [77-80] 77 (02/25 0645) Resp:  [15-16] 15 (02/25 0645) BP: (125-148)/(65-76) 125/65 mmHg (02/25 0645) SpO2:  [97 %-99 %] 97 % (02/25 0645) Last BM Date: 11/25/15 General:  Alert, Well-developed, in NAD Heart:  Regular rate and rhythm; no murmurs Pulm:  CTAB.  No W/R/R. Abdomen:  Soft, non-distended.  Normal bowel sounds.  Mild upper abdominal TTP. Extremities:  Without edema. Neurologic:  Alert and oriented x 4;  grossly normal neurologically. Psych:  Alert and cooperative. Normal mood and affect.  Intake/Output from previous day: 02/24 0701 - 02/25 0700 In: 400 [IV Piggyback:400] Out: 780 [Urine:780] Intake/Output this shift: Total I/O In: -  Out: 200 [Urine:200]  Lab Results:  Recent Labs  11/28/15 0503 11/29/15 0540  WBC 5.8 4.4  HGB 10.1* 10.3*  HCT 31.9* 30.6*  PLT 322 293   BMET  Recent Labs  11/28/15 0039 11/28/15 0503 11/29/15 0540  NA 137 138 137  K 2.7* 3.1* 3.0*  CL 96* 97* 99*  CO2 GLUCOSE 180* 144* 94  BUN 10 8 <5*  CREATININE 1.11 1.09 0.76  CALCIUM 8.0* 8.2* 8.6*   LFT  Recent Labs  11/29/15 0540  PROT 6.2*  ALBUMIN 2.4*  AST 109*  ALT 47  ALKPHOS 223*  BILITOT 1.2   Assessment / Plan: *Ongoing severe pancreatitis. Initially due to increase triglycerides and ETOH. Has been abstinent and trigs lower on tricor. Pseudocyst of initial notice 2/1 diminished by 2/22 CT but now with associated gastric vs duodenal vs colonic microperforation. On Imipenem. On BID IV PPI.  Improving.  Trial of clear liquids today.   *IDDM.   *Ongoing hyponatremia, got Potassium 2/23. Resolved AKI.  *Normocytic anemia. Hgb stable after initial post  hydration drop.    LOS: 3 days   Brent Costa D.  11/30/2015, 10:20 AM  Pager number [phone]

## 2015-11-30 NOTE — Progress Notes (Signed)
PROGRESS NOTE  Brent Costa NWG:[id number] DOB: 03/04/1991 DOA: 11/27/2015 PCP: Jeanann Lewandowsky, MD Outpatient Specialists:    LOS: 3 days   Brief Narrative: 25 yo M with recurrent pancreatitis admitted on 2/22 with abdominal pain, nausea, vomiting, found to be in DKA. CT scan of abdomen and pelvis on admission showed microperforations presumably of the stomach or duodenum. Gastroenterology and general surgery for consulted on patient's admission.  Assessment & Plan: Active Problems:   Uncontrolled diabetes mellitus without complication (HCC)   Essential hypertension   Nausea and vomiting   Pancreatic pseudocyst   AKI (acute kidney injury) (HCC)   DKA (diabetic ketoacidoses) (HCC)   Rash   Pseudocyst of pancreas   Abnormal CT of the abdomen   Other acute pancreatitis   DKA - Patient was initiated on insulin drip on admission as well as IV fluids, his DKA resolved, he is now off of insulin infusion and he was restarted on 35 units of Lantus daily. - good CBGs, 92-120. Closely monitor since starting a diet today   Acute on chronic pancreatitis with gastric and duodenal microperforations, as well as pancreatic pseudocyst - CT scan of the abdomen and pelvis as below - Appreciate general surgery and gastroenterology consultation - Pain control and supportive treatment  - Continue Primaxin - improving, clears today  Acute kidney injury - Like in the setting of dehydration with nausea vomiting and poor by mouth intake over the last few days - Improved with IV fluids  Hypokalemia - In the setting of DKA treated with insulin as well as poor by mouth intake - Replete as needed  Hypertension - Hydralazine as needed  Rash - ongoing for months.  - Multiple treatments in the past including for scabies w/o imrpovement.  - Most recently started on steroid cream w/ improvement.  - Hydrocortisone oint BID x5-7 days then DC  Hypertriglyceridemia - As high as 3000 at the  beginning of January, now his triglycerides are 223.   DVT prophylaxis: Lovenox Code Status: Full Family Communication: no family bedside bedside  Disposition Plan: home when ready  Barriers for discharge: acute pancreatitis  Consultants:   General surgery   GI  Procedures:   None   Antimicrobials:  Imipenem 2/22 >>   Subjective: - Feels a lot better, no nausea. Smiling. His mom is feeling better and was able to visit last night.   Objective: Filed Vitals:   11/29/15 0448 11/29/15 1333 11/29/15 2202 11/30/15 0645  BP: 145/61 148/76 141/75 125/65  Pulse: 86 80 79 77  Temp: 98.2 F (36.8 C) 97.7 F (36.5 C) 98.6 F (37 C) 98.8 F (37.1 C)  TempSrc: Oral Oral Oral Oral  Resp: Height:      Weight:      SpO2: 94% 99% 99% 97%    Intake/Output Summary (Last 24 hours) at 11/30/15 1455 Last data filed at 11/30/15 1029  Gross per 24 hour  Intake    400 ml  Output    780 ml  Net   -380 ml   Filed Weights   11/27/15 2028  Weight: 102.6 kg (226 lb 3.1 oz)    Examination: BP 125/65 mmHg  Pulse 77  Temp(Src) 98.8 F (37.1 C) (Oral)  Resp 15  Ht  (1.753 m)  Wt 102.6 kg (226 lb 3.1 oz)  BMI 33.39 kg/m2  SpO2 97% General exam: NAD Respiratory system: Clear. No increased work of breathing. No wheezing, no crackles Cardiovascular system: regular  rate and rhythm, no murmurs, gallops. No JVD. No peripheral edema.  Gastrointestinal system: Abdomen is nondistended, soft and tender to palpation throughout, mainly in the epigastric area. Normal bowel sounds heard.   Data Reviewed: I have personally reviewed following labs and imaging studies  CBC:  Recent Labs Lab 11/26/15 2349 11/27/15 0937 11/28/15 0503 11/29/15 0540  WBC 9.9 6.3 5.8 4.4  HGB 13.6 10.9* 10.1* 10.3*  HCT 41.1 33.6* 31.9* 30.6*  MCV 80.6 82.4 80.6 80.1  PLT 503* 332 322 293   Basic Metabolic Panel:  Recent Labs Lab 11/27/15 0937  11/27/15 1640 11/27/15 2120  11/28/15 0039 11/28/15 0503 11/29/15 0540  NA 137  < > 136 139 137 138 137  K 3.3*  < > 3.3* 2.8* 2.7* 3.1* 3.0*  CL 88*  < > 93* 93* 96* 97* 99*  CO2 36*  < > 27 33* 29 31 30   GLUCOSE 280*  < > 149* 142* 180* 144* 94  BUN 17  < > 13 10 10 8  <5*  CREATININE 1.82*  < > 1.38* 1.32* 1.11 1.09 0.76  CALCIUM 8.3*  < > 8.1* 8.3* 8.0* 8.2* 8.6*  MG 1.8  --   --   --   --   --   --   PHOS 4.1  --   --   --   --   --   --   < > = values in this interval not displayed. GFR: Estimated Creatinine Clearance: 169.6 mL/min (by C-G formula based on Cr of 0.76). Liver Function Tests:  Recent Labs Lab 11/26/15 2349 11/28/15 0503 11/29/15 0540  AST 13* 14* 109*  ALT 10* 8* 47  ALKPHOS 105 60 223*  BILITOT 1.0 0.3 1.2  PROT 8.5* 5.5* 6.2*  ALBUMIN 3.7 2.3* 2.4*    Recent Labs Lab 11/26/15 2349 11/27/15 1640 11/28/15 0503  LIPASE 35 30 29   No results for input(s): AMMONIA in the last 168 hours. Coagulation Profile: No results for input(s): INR, PROTIME in the last 168 hours. Cardiac Enzymes: No results for input(s): CKTOTAL, CKMB, CKMBINDEX, TROPONINI in the last 168 hours. BNP (last 3 results) No results for input(s): PROBNP in the last 8760 hours. HbA1C: No results for input(s): HGBA1C in the last 72 hours. CBG:  Recent Labs Lab 11/29/15 2357 11/30/15 0355 11/30/15 0642 11/30/15 1027 11/30/15 1224  GLUCAP 93 118* 114* 92 120*   Lipid Profile: No results for input(s): CHOL, HDL, LDLCALC, TRIG, CHOLHDL, LDLDIRECT in the last 72 hours. Thyroid Function Tests: No results for input(s): TSH, T4TOTAL, FREET4, T3FREE, THYROIDAB in the last 72 hours. Anemia Panel: No results for input(s): VITAMINB12, FOLATE, FERRITIN, TIBC, IRON, RETICCTPCT in the last 72 hours. Urine analysis:    Component Value Date/Time   COLORURINE YELLOW 11/27/2015 1542   APPEARANCEUR CLOUDY* 11/27/2015 1542   LABSPEC 1.039* 11/27/2015 1542   PHURINE 7.0 11/27/2015 1542   GLUCOSEU NEGATIVE 11/27/2015  1542   HGBUR MODERATE* 11/27/2015 1542   BILIRUBINUR NEGATIVE 11/27/2015 1542   BILIRUBINUR neg 06/25/2015 1615   KETONESUR NEGATIVE 11/27/2015 1542   PROTEINUR NEGATIVE 11/27/2015 1542   PROTEINUR neg 06/25/2015 1615   UROBILINOGEN 0.2 06/25/2015 1615   UROBILINOGEN 1.0 08/30/2014 2308   NITRITE NEGATIVE 11/27/2015 1542   NITRITE neg 06/25/2015 1615   LEUKOCYTESUR NEGATIVE 11/27/2015 1542   Sepsis Labs: Invalid input(s): PROCALCITONIN, LACTICIDVEN  Recent Results (from the past 240 hour(s))  Culture, blood (routine x 2)     Status: None (Preliminary  result)   Collection Time: 11/27/15  3:28 PM  Result Value Ref Range Status   Specimen Description BLOOD RIGHT ANTECUBITAL  Final   Special Requests BOTTLES DRAWN AEROBIC AND ANAEROBIC 5CC  Final   Culture NO GROWTH 2 DAYS  Final   Report Status PENDING  Incomplete  Culture, blood (routine x 2)     Status: None (Preliminary result)   Collection Time: 11/27/15  3:35 PM  Result Value Ref Range Status   Specimen Description BLOOD RIGHT HAND  Final   Special Requests BOTTLES DRAWN AEROBIC ONLY 5CC  Final   Culture NO GROWTH 2 DAYS  Final   Report Status PENDING  Incomplete      Radiology Studies: No results found.   Scheduled Meds: . antiseptic oral rinse  7 mL Mouth Rinse q12n4p  . chlorhexidine  15 mL Mouth Rinse BID  . enoxaparin (LOVENOX) injection  50 mg Subcutaneous Q24H  . fenofibrate  54 mg Oral Daily  . hydrocortisone   Topical BID  . imipenem-cilastatin  500 mg Intravenous Q6H  . insulin aspart  0-20 Units Subcutaneous Q4H  . insulin glargine  35 Units Subcutaneous QHS  . metoCLOPramide (REGLAN) injection  10 mg Intravenous 3 times per day  . ondansetron  4 mg Intravenous Q6H  . pantoprazole (PROTONIX) IV  40 mg Intravenous Q12H  . sodium chloride flush  3 mL Intravenous Q12H   Continuous Infusions: . dextrose 5 % and 0.45% NaCl 1,000 mL with potassium chloride 30 mEq infusion 125 mL/hr at 11/30/15 1302    Pamella Pert, MD, PhD [facility] Pager [phone]  If 7PM-7AM, please contact night-coverage [email or web address] Password [facility] 11/30/2015, 2:55 PM

## 2015-11-30 NOTE — Progress Notes (Signed)
  Subjective: He still has some pain on palpation but continues to feel better.  He almost left last PM, his mother had an asthma attack, but she was able to come see him.    Objective: Vital signs in last 24 hours: Temp:  [97.7 F (36.5 C)-98.8 F (37.1 C)] 98.8 F (37.1 C) (02/25 0645) Pulse Rate:  [77-80] 77 (02/25 0645) Resp:  [15-16] 15 (02/25 0645) BP: (125-148)/(65-76) 125/65 mmHg (02/25 0645) SpO2:  [97 %-99 %] 97 % (02/25 0645) Last BM Date: 11/25/15 NPO 780 urine  Afebrile, VSS No labs today K+ 3.0 yesterday Intake/Output from previous day: 02/24 0701 - 02/25 0700 In: 400 [IV Piggyback:400] Out: 780 [Urine:780] Intake/Output this shift: Total I/O In: -  Out: 200 [Urine:200]  General appearance: alert, cooperative and no distress GI: soft, still a little tender mid abdomen, + BS.  Lab Results:   Recent Labs  11/28/15 0503 11/29/15 0540  WBC 5.8 4.4  HGB 10.1* 10.3*  HCT 31.9* 30.6*  PLT 322 293    BMET  Recent Labs  11/28/15 0503 11/29/15 0540  NA 138 137  K 3.1* 3.0*  CL 97* 99*  CO2 31 30  GLUCOSE 144* 94  BUN 8 <5*  CREATININE 1.09 0.76  CALCIUM 8.2* 8.6*   PT/INR No results for input(s): LABPROT, INR in the last 72 hours.   Recent Labs Lab 11/26/15 2349 11/28/15 0503 11/29/15 0540  AST 13* 14* 109*  ALT 10* 8* 47  ALKPHOS 105 60 223*  BILITOT 1.0 0.3 1.2  PROT 8.5* 5.5* 6.2*  ALBUMIN 3.7 2.3* 2.4*     Lipase     Component Value Date/Time   LIPASE 29 11/28/2015 0503     Studies/Results: No results found.  Medications: . antiseptic oral rinse  7 mL Mouth Rinse q12n4p  . chlorhexidine  15 mL Mouth Rinse BID  . enoxaparin (LOVENOX) injection  50 mg Subcutaneous Q24H  . fenofibrate  54 mg Oral Daily  . hydrocortisone   Topical BID  . imipenem-cilastatin  500 mg Intravenous Q6H  . insulin aspart  0-20 Units Subcutaneous Q4H  . insulin glargine  35 Units Subcutaneous QHS  . metoCLOPramide (REGLAN) injection  10 mg  Intravenous 3 times per day  . ondansetron  4 mg Intravenous Q6H  . pantoprazole (PROTONIX) IV  40 mg Intravenous Q12H  . sodium chloride flush  3 mL Intravenous Q12H    Assessment/Plan Recurrent pancreatitis  Possible Microperforation of the stomach or duodenum with significant inflammatory changes second portion of the duodenum, head of the pancreas, hepatic flexure of the colon. Diabetes, not sure if it's type I or II (HA1C 12.4 - 10/17/15) Hypertension Dyslipidemia with elevated triglycerides Hypokalemia  Antibiotics: Day 3 imipenem-cilastatin  DVT: Lovenox/SCD   Plan:  Start him on clear liquids, continue antibiotics for now.  Recheck labs tomorrow.    LOS: 3 days    Sharlotte Baka 11/30/2015

## 2015-12-01 LAB — CBC
HEMATOCRIT: 30.1 % — AB (ref 39.0–52.0)
HEMOGLOBIN: 9.9 g/dL — AB (ref 13.0–17.0)
MCH: 25.8 pg — ABNORMAL LOW (ref 26.0–34.0)
MCHC: 32.9 g/dL (ref 30.0–36.0)
MCV: 78.6 fL (ref 78.0–100.0)
Platelets: 328 10*3/uL (ref 150–400)
RBC: 3.83 MIL/uL — AB (ref 4.22–5.81)
RDW: 13.7 % (ref 11.5–15.5)
WBC: 3.5 10*3/uL — ABNORMAL LOW (ref 4.0–10.5)

## 2015-12-01 LAB — LIPASE, BLOOD: Lipase: 20 U/L (ref 11–51)

## 2015-12-01 LAB — GLUCOSE, CAPILLARY
GLUCOSE-CAPILLARY: 95 mg/dL (ref 65–99)
GLUCOSE-CAPILLARY: 157 mg/dL — AB (ref 65–99)
GLUCOSE-CAPILLARY: 131 mg/dL — AB (ref 65–99)
Glucose-Capillary: 112 mg/dL — ABNORMAL HIGH (ref 65–99)
Glucose-Capillary: 127 mg/dL — ABNORMAL HIGH (ref 65–99)
Glucose-Capillary: 121 mg/dL — ABNORMAL HIGH (ref 65–99)

## 2015-12-01 LAB — COMPREHENSIVE METABOLIC PANEL
ALBUMIN: 2.3 g/dL — AB (ref 3.5–5.0)
ALK PHOS: 227 U/L — AB (ref 38–126)
ALT: 30 U/L (ref 17–63)
ANION GAP: 7 (ref 5–15)
AST: 26 U/L (ref 15–41)
BILIRUBIN TOTAL: 0.7 mg/dL (ref 0.3–1.2)
CO2: 26 mmol/L (ref 22–32)
CREATININE: 0.74 mg/dL (ref 0.61–1.24)
Calcium: 8.5 mg/dL — ABNORMAL LOW (ref 8.9–10.3)
Chloride: 107 mmol/L (ref 101–111)
GFR calc Af Amer: >60 mL/min (ref >60)
GFR calc non Af Amer: >60 mL/min (ref >60)
GLUCOSE: 107 mg/dL — AB (ref 65–99)
Potassium: 4 mmol/L (ref 3.5–5.1)
Sodium: 140 mmol/L (ref 135–145)
TOTAL PROTEIN: 5.9 g/dL — AB (ref 6.5–8.1)

## 2015-12-01 MED ORDER — OXYCODONE-ACETAMINOPHEN 5-325 MG PO TABS
1.0000 | ORAL_TABLET | ORAL | Status: DC | PRN
  Administered 2015-12-01 – 2015-12-02 (×2): 1 via ORAL
  Filled 2015-12-01: qty 1
  Filled 2015-12-01: qty 2

## 2015-12-01 MED ORDER — WHITE PETROLATUM GEL
Status: AC
  Administered 2015-12-01: 0.2
  Filled 2015-12-01: qty 1

## 2015-12-01 MED ORDER — AMOXICILLIN-POT CLAVULANATE 875-125 MG PO TABS
1.0000 | ORAL_TABLET | Freq: Two times a day (BID) | ORAL | Status: DC
  Administered 2015-12-01 – 2015-12-02 (×3): 1 via ORAL
  Filled 2015-12-01 (×3): qty 1

## 2015-12-01 NOTE — Progress Notes (Signed)
     [facility] Progress Note  Subjective:  Feeling better.  Pain 4-5/10 without pain meds.  Tolerated clear liquids.  Had a BM that was normal appearing; no sign of GI bleeding.  Objective:  Vital signs in last 24 hours: Temp:  [98.4 F (36.9 C)-98.5 F (36.9 C)] 98.4 F (36.9 C) (02/26 0552) Pulse Rate:  [65-81] 65 (02/26 0552) Resp:  [16-18] 18 (02/26 0552) BP: (132-151)/(66-82) 132/66 mmHg (02/26 0552) SpO2:  [98 %-100 %] 100 % (02/26 0552) Last BM Date: 11/30/15 General:  Alert, Well-developed, in NAD Heart:  Regular rate and rhythm; no murmurs Pulm:  CTAB.  No W/R/R. Abdomen:  Soft, non-distended.  BS present.  Mild upper abdominal TTP. Extremities:  Without edema. Neurologic:  Alert and  oriented x4;  grossly normal neurologically. Psych:  Alert and cooperative. Normal mood and affect.  Intake/Output from previous day: 02/25 0701 - 02/26 0700 In: -  Out: 940 [Urine:940]  Lab Results:  Recent Labs  11/29/15 0540 12/01/15 0647  WBC 4.4 3.5*  HGB 10.3* 9.9*  HCT 30.6* 30.1*  PLT 293 328   BMET  Recent Labs  11/29/15 0540 12/01/15 0647  NA 137 140  K 3.0* 4.0  CL 99* 107  CO2 30 26  GLUCOSE 94 107*  BUN <5* <5*  CREATININE 0.76 0.74  CALCIUM 8.6* 8.5*   LFT  Recent Labs  12/01/15 0647  PROT 5.9*  ALBUMIN 2.3*  AST 26  ALT 30  ALKPHOS 227*  BILITOT 0.7   Assessment / Plan: *Ongoing severe pancreatitis. Initially due to increase triglycerides and ETOH. Has been abstinent and trigs lower on tricor. Pseudocyst of initial notice 2/1 diminished by 2/22 CT but now with associated gastric vs duodenal vs colonic microperforation. On Imipenem. On BID IV PPI. Improving.  Surgery recommending changing to PO antibiotics and advancing diet with possible discharge 2/27.   *IDDM.   *Normocytic anemia. Hgb relatively stable after initial post hydration drop.  No sign of GI bleeding.   LOS: 4 days   Rivky Clendenning D.  12/01/2015, 10:31  AM  Pager number [phone]

## 2015-12-01 NOTE — Progress Notes (Signed)
  Subjective: Patient tolerating clear liquids without any abdominal pain No nausea or vomiting + BM  Objective: Vital signs in last 24 hours: Temp:  [98.4 F (36.9 C)-98.5 F (36.9 C)] 98.4 F (36.9 C) (02/26 0552) Pulse Rate:  [65-81] 65 (02/26 0552) Resp:  [16-18] 18 (02/26 0552) BP: (132-151)/(66-82) 132/66 mmHg (02/26 0552) SpO2:  [98 %-100 %] 100 % (02/26 0552) Last BM Date: 11/30/15  Intake/Output from previous day: 02/25 0701 - 02/26 0700 In: -  Out: 940 [Urine:940] Intake/Output this shift:    General appearance: alert, cooperative and no distress GI: soft, non-tender; bowel sounds normal; no masses,  no organomegaly  Lab Results:   Recent Labs  11/29/15 0540 12/01/15 0647  WBC 4.4 3.5*  HGB 10.3* 9.9*  HCT 30.6* 30.1*  PLT 293 328   BMET  Recent Labs  11/29/15 0540 12/01/15 0647  NA 137 140  K 3.0* 4.0  CL 99* 107  CO2 30 26  GLUCOSE 94 107*  BUN <5* <5*  CREATININE 0.76 0.74  CALCIUM 8.6* 8.5*   PT/INR No results for input(s): LABPROT, INR in the last 72 hours. ABG No results for input(s): PHART, HCO3 in the last 72 hours.  Invalid input(s): PCO2, PO2  Studies/Results: No results found.  Anti-infectives: Anti-infectives    Start     Dose/Rate Route Frequency Ordered Stop   11/28/15 2000  imipenem-cilastatin (PRIMAXIN) 500 mg in sodium chloride 0.9 % 100 mL IVPB     500 mg 200 mL/hr over 30 Minutes Intravenous Every 6 hours 11/28/15 1607     11/27/15 1715  imipenem-cilastatin (PRIMAXIN) 500 mg in sodium chloride 0.9 % 100 mL IVPB  Status:  Discontinued     500 mg 200 mL/hr over 30 Minutes Intravenous 3 times per day 11/27/15 1622 11/28/15 1607   11/27/15 1530  ciprofloxacin (CIPRO) IVPB 400 mg  Status:  Discontinued     400 mg 200 mL/hr over 60 Minutes Intravenous Every 12 hours 11/27/15 1519 11/27/15 1622   11/27/15 1530  metroNIDAZOLE (FLAGYL) IVPB 500 mg  Status:  Discontinued     500 mg 100 mL/hr over 60 Minutes Intravenous  Every 8 hours 11/27/15 1519 11/27/15 1622      Assessment/Plan: Recurrent pancreatitis with possible duodenal microperforation  Advance diet  Consider change to PO abx Possible discharge tomorrow.   LOS: 4 days    Caleesi Kohl K. 12/01/2015

## 2015-12-01 NOTE — Progress Notes (Signed)
PROGRESS NOTE  Brent Costa ZOX:[id number] DOB: 1991-07-21 DOA: 11/27/2015 PCP: Jeanann Lewandowsky, MD Outpatient Specialists:    LOS: 4 days   Brief Narrative: 25 yo M with recurrent pancreatitis admitted on 2/22 with abdominal pain, nausea, vomiting, found to be in DKA. CT scan of abdomen and pelvis on admission showed microperforations presumably of the stomach or duodenum. Gastroenterology and general surgery for consulted on patient's admission.  Assessment & Plan: Active Problems:   Uncontrolled diabetes mellitus without complication (HCC)   Essential hypertension   Nausea and vomiting   Pancreatic pseudocyst   AKI (acute kidney injury) (HCC)   DKA (diabetic ketoacidoses) (HCC)   Rash   Pseudocyst of pancreas   Abnormal CT of the abdomen   Other acute pancreatitis   DKA - Patient was initiated on insulin drip on admission as well as IV fluids, his DKA resolved, he is now off of insulin infusion and he was restarted on 35 units of Lantus daily. - good CBGs, 95-127. Closely monitor since diet is advancing again today to heart healthy/carb modified  Acute on chronic pancreatitis with gastric and duodenal microperforations, as well as pancreatic pseudocyst - CT scan of the abdomen and pelvis as below - Appreciate general surgery and gastroenterology consultation - Pain control and supportive treatment  - change antibiotics to Augmentin - improving, tolerating diet, advance to heart healthy/carb modified  Acute kidney injury - Like in the setting of dehydration with nausea vomiting and poor by mouth intake over the last few days - Improved with IV fluids  Hypokalemia - In the setting of DKA treated with insulin as well as poor by mouth intake - Replete as needed  Hypertension - Hydralazine as needed  Rash - ongoing for months.  - Multiple treatments in the past including for scabies w/o imrpovement.  - Most recently started on steroid cream w/ improvement.  -  Hydrocortisone oint BID x5-7 days then DC  Hypertriglyceridemia - As high as 3000 at the beginning of January, now his triglycerides are 223.   DVT prophylaxis: Lovenox Code Status: Full Family Communication: no family bedside bedside. Discussed with brother 2/24 bedside Disposition Plan: home when ready  Barriers for discharge: acute pancreatitis  Consultants:   General surgery   GI  Procedures:   None   Antimicrobials:  Imipenem 2/22 >> 2/26   Augmentin 2/26 >>  Subjective: - abdominal pain improved, no nausea/vomiting  Objective: Filed Vitals:   11/30/15 0645 11/30/15 1515 11/30/15 2043 12/01/15 0552  BP: 125/65 151/67 138/82 132/66  Pulse: 77 73 81 65  Temp: 98.8 F (37.1 C) 98.5 F (36.9 C) 98.4 F (36.9 C) 98.4 F (36.9 C)  TempSrc: Oral Oral Oral Oral  Resp: Height:      Weight:      SpO2: 97% 98% 100% 100%    Intake/Output Summary (Last 24 hours) at 12/01/15 1431 Last data filed at 12/01/15 0550  Gross per 24 hour  Intake      0 ml  Output    540 ml  Net   -540 ml   Filed Weights   11/27/15 2028  Weight: 102.6 kg (226 lb 3.1 oz)    Examination: BP 132/66 mmHg  Pulse 65  Temp(Src) 98.4 F (36.9 C) (Oral)  Resp 18  Ht  (1.753 m)  Wt 102.6 kg (226 lb 3.1 oz)  BMI 33.39 kg/m2  SpO2 100% General exam: NAD Respiratory system: Clear. No increased work of breathing.  No wheezing, no crackles Cardiovascular system: regular rate and rhythm, no murmurs, gallops. No JVD. No peripheral edema.  Gastrointestinal system: Abdomen is nondistended, soft and tender to palpation throughout, mainly in the epigastric area. Tenderness improved. Normal bowel sounds heard.   Data Reviewed: I have personally reviewed following labs and imaging studies  CBC:  Recent Labs Lab 11/26/15 2349 11/27/15 0937 11/28/15 0503 11/29/15 0540 12/01/15 0647  WBC 9.9 6.3 5.8 4.4 3.5*  HGB 13.6 10.9* 10.1* 10.3* 9.9*  HCT 41.1 33.6* 31.9* 30.6*  30.1*  MCV 80.6 82.4 80.6 80.1 78.6  PLT 503* 332 322 293 328   Basic Metabolic Panel:  Recent Labs Lab 11/27/15 0937  11/27/15 2120 11/28/15 0039 11/28/15 0503 11/29/15 0540 12/01/15 0647  NA 137  < > 139 137 138 137 140  K 3.3*  < > 2.8* 2.7* 3.1* 3.0* 4.0  CL 88*  < > 93* 96* 97* 99* 107  CO2 36*  < > 33* GLUCOSE 280*  < > 142* 180* 144* 94 107*  BUN 17  < > <5* <5*  CREATININE 1.82*  < > 1.32* 1.11 1.09 0.76 0.74  CALCIUM 8.3*  < > 8.3* 8.0* 8.2* 8.6* 8.5*  MG 1.8  --   --   --   --   --   --   PHOS 4.1  --   --   --   --   --   --   < > = values in this interval not displayed. GFR: Estimated Creatinine Clearance: 169.6 mL/min (by C-G formula based on Cr of 0.74). Liver Function Tests:  Recent Labs Lab 11/26/15 2349 11/28/15 0503 11/29/15 0540 12/01/15 0647  AST 13* 14* 109* 26  ALT 10* 8* 47 30  ALKPHOS 105 60 223* 227*  BILITOT 1.0 0.3 1.2 0.7  PROT 8.5* 5.5* 6.2* 5.9*  ALBUMIN 3.7 2.3* 2.4* 2.3*    Recent Labs Lab 11/26/15 2349 11/27/15 1640 11/28/15 0503 12/01/15 0647  LIPASE 35 No results for input(s): AMMONIA in the last 168 hours. Coagulation Profile: No results for input(s): INR, PROTIME in the last 168 hours. Cardiac Enzymes: No results for input(s): CKTOTAL, CKMB, CKMBINDEX, TROPONINI in the last 168 hours. BNP (last 3 results) No results for input(s): PROBNP in the last 8760 hours. HbA1C: No results for input(s): HGBA1C in the last 72 hours. CBG:  Recent Labs Lab 11/30/15 2040 11/30/15 2357 12/01/15 0348 12/01/15 0805 12/01/15 1247  GLUCAP 104* 92 112* 95 127*   Lipid Profile: No results for input(s): CHOL, HDL, LDLCALC, TRIG, CHOLHDL, LDLDIRECT in the last 72 hours. Thyroid Function Tests: No results for input(s): TSH, T4TOTAL, FREET4, T3FREE, THYROIDAB in the last 72 hours. Anemia Panel: No results for input(s): VITAMINB12, FOLATE, FERRITIN, TIBC, IRON, RETICCTPCT in the last 72 hours. Urine  analysis:    Component Value Date/Time   COLORURINE YELLOW 11/27/2015 1542   APPEARANCEUR CLOUDY* 11/27/2015 1542   LABSPEC 1.039* 11/27/2015 1542   PHURINE 7.0 11/27/2015 1542   GLUCOSEU NEGATIVE 11/27/2015 1542   HGBUR MODERATE* 11/27/2015 1542   BILIRUBINUR NEGATIVE 11/27/2015 1542   BILIRUBINUR neg 06/25/2015 1615   KETONESUR NEGATIVE 11/27/2015 1542   PROTEINUR NEGATIVE 11/27/2015 1542   PROTEINUR neg 06/25/2015 1615   UROBILINOGEN 0.2 06/25/2015 1615   UROBILINOGEN 1.0 08/30/2014 2308   NITRITE NEGATIVE 11/27/2015 1542   NITRITE neg 06/25/2015 1615   LEUKOCYTESUR NEGATIVE 11/27/2015 1542  Sepsis Labs: Invalid input(s): PROCALCITONIN, LACTICIDVEN  Recent Results (from the past 240 hour(s))  Culture, blood (routine x 2)     Status: None (Preliminary result)   Collection Time: 11/27/15  3:28 PM  Result Value Ref Range Status   Specimen Description BLOOD RIGHT ANTECUBITAL  Final   Special Requests BOTTLES DRAWN AEROBIC AND ANAEROBIC 5CC  Final   Culture NO GROWTH 3 DAYS  Final   Report Status PENDING  Incomplete  Culture, blood (routine x 2)     Status: None (Preliminary result)   Collection Time: 11/27/15  3:35 PM  Result Value Ref Range Status   Specimen Description BLOOD RIGHT HAND  Final   Special Requests BOTTLES DRAWN AEROBIC ONLY 5CC  Final   Culture NO GROWTH 3 DAYS  Final   Report Status PENDING  Incomplete      Radiology Studies: No results found.   Scheduled Meds: . amoxicillin-clavulanate  1 tablet Oral Q12H  . antiseptic oral rinse  7 mL Mouth Rinse q12n4p  . chlorhexidine  15 mL Mouth Rinse BID  . enoxaparin (LOVENOX) injection  50 mg Subcutaneous Q24H  . fenofibrate  54 mg Oral Daily  . hydrocortisone   Topical BID  . insulin aspart  0-20 Units Subcutaneous Q4H  . insulin glargine  35 Units Subcutaneous QHS  . metoCLOPramide (REGLAN) injection  10 mg Intravenous 3 times per day  . ondansetron  4 mg Intravenous Q6H  . pantoprazole (PROTONIX) IV   40 mg Intravenous Q12H  . sodium chloride flush  3 mL Intravenous Q12H   Continuous Infusions: . dextrose 5 % and 0.45% NaCl 1,000 mL with potassium chloride 30 mEq infusion 125 mL/hr at 12/01/15 0855   Pamella Pert, MD, PhD [facility] Pager [phone]  If 7PM-7AM, please contact night-coverage [email or web address] Password [facility] 12/01/2015, 2:31 PM

## 2015-12-02 DIAGNOSIS — R1013 Epigastric pain: Secondary | ICD-10-CM

## 2015-12-02 LAB — GLUCOSE, CAPILLARY
Glucose-Capillary: 95 mg/dL (ref 65–99)
Glucose-Capillary: 96 mg/dL (ref 65–99)

## 2015-12-02 MED ORDER — INSULIN ASPART 100 UNIT/ML ~~LOC~~ SOLN: 3.0000 [IU] | Freq: Three times a day (TID) | SUBCUTANEOUS | Status: DC

## 2015-12-02 MED ORDER — METOCLOPRAMIDE HCL 10 MG PO TABS: 5.0000 mg | ORAL_TABLET | Freq: Four times a day (QID) | ORAL | Status: DC | PRN

## 2015-12-02 MED ORDER — OXYCODONE-ACETAMINOPHEN 5-325 MG PO TABS: 1.0000 | ORAL_TABLET | ORAL | Status: DC | PRN

## 2015-12-02 MED ORDER — AMOXICILLIN-POT CLAVULANATE 875-125 MG PO TABS: 1.0000 | ORAL_TABLET | Freq: Two times a day (BID) | ORAL | Status: DC

## 2015-12-02 NOTE — Progress Notes (Signed)
Frederic A Bascom Levels to be D/C'd Home per MD order.  Discussed with the patient and all questions fully answered.  VSS, Skin clean, dry and intact without evidence of skin break down, no evidence of skin tears noted. IV catheter discontinued intact. Site without signs and symptoms of complications. Dressing and pressure applied.  An After Visit Summary was printed and given to the patient. Patient received prescription.  D/c education completed with patient/family including follow up instructions, medication list, d/c activities limitations if indicated, with other d/c instructions as indicated by MD - patient able to verbalize understanding, all questions fully answered.   Patient instructed to return to ED, call 911, or call MD for any changes in condition.   Patient escorted via WC, and D/C home via private auto.  Pura Spice 12/02/2015 11:11 AM

## 2015-12-02 NOTE — Discharge Instructions (Signed)
Follow with Angelica Chessman, MD in 5-7 days  Please get a complete blood count and chemistry panel checked by your Primary MD at your next visit, and again as instructed by your Primary MD. Please get your medications reviewed and adjusted by your Primary MD.  Please request your Primary MD to go over all Hospital Tests and Procedure/Radiological results at the follow up, please get all Hospital records sent to your Prim MD by signing hospital release before you go home.  If you had Pneumonia of Lung problems at the Hospital: Please get a 2 view Chest X ray done in 6-8 weeks after hospital discharge or sooner if instructed by your Primary MD.  If you have Congestive Heart Failure: Please call your Cardiologist or Primary MD anytime you have any of the following symptoms:  1) 3 pound weight gain in 24 hours or 5 pounds in 1 week  2) shortness of breath, with or without a dry hacking cough  3) swelling in the hands, feet or stomach  4) if you have to sleep on extra pillows at night in order to breathe  Follow cardiac low salt diet and 1.5 lit/day fluid restriction.  If you have diabetes Accuchecks 4 times/day, Once in AM empty stomach and then before each meal. Log in all results and show them to your primary doctor at your next visit. If any glucose reading is under 80 or above 300 call your primary MD immediately.  If you have Seizure/Convulsions/Epilepsy: Please do not drive, operate heavy machinery, participate in activities at heights or participate in high speed sports until you have seen by Primary MD or a Neurologist and advised to do so again.  If you had Gastrointestinal Bleeding: Please ask your Primary MD to check a complete blood count within one week of discharge or at your next visit. Your endoscopic/colonoscopic biopsies that are pending at the time of discharge, will also need to followed by your Primary MD.  Get Medicines reviewed and adjusted. Please take all your  medications with you for your next visit with your Primary MD  Please request your Primary MD to go over all hospital tests and procedure/radiological results at the follow up, please ask your Primary MD to get all Hospital records sent to his/her office.  If you experience worsening of your admission symptoms, develop shortness of breath, life threatening emergency, suicidal or homicidal thoughts you must seek medical attention immediately by calling 911 or calling your MD immediately  if symptoms less severe.  You must read complete instructions/literature along with all the possible adverse reactions/side effects for all the Medicines you take and that have been prescribed to you. Take any new Medicines after you have completely understood and accpet all the possible adverse reactions/side effects.   Do not drive or operate heavy machinery when taking Pain medications.   Do not take more than prescribed Pain, Sleep and Anxiety Medications  Special Instructions: If you have smoked or chewed Tobacco  in the last 2 yrs please stop smoking, stop any regular Alcohol  and or any Recreational drug use.  Wear Seat belts while driving.  Please note You were cared for by a hospitalist during your hospital stay. If you have any questions about your discharge medications or the care you received while you were in the hospital after you are discharged, you can call the unit and asked to speak with the hospitalist on call if the hospitalist that took care of you is not available. Once  you are discharged, your primary care physician will handle any further medical issues. Please note that NO REFILLS for any discharge medications will be authorized once you are discharged, as it is imperative that you return to your primary care physician (or establish a relationship with a primary care physician if you do not have one) for your aftercare needs so that they can reassess your need for medications and monitor your  lab values.  You can reach the hospitalist office at phone [phone] or fax [phone]   If you do not have a primary care physician, you can call [phone] for a physician referral.  Activity: As tolerated with Full fall precautions use walker/cane & assistance as needed  Diet: low fat, diabetic  Disposition Home

## 2015-12-02 NOTE — Progress Notes (Signed)
Daily Rounding Note  12/02/2015, 8:31 AM  LOS: 5 days   SUBJECTIVE:       Tolerating solid carb mod diet.  Pain much improved.  No nausea.  BM brown, last was this AM  OBJECTIVE:         Vital signs in last 24 hours:    Temp:  [98.2 F (36.8 C)-99.1 F (37.3 C)] 98.7 F (37.1 C) (02/27 0605) Pulse Rate:  [53-82] 82 (02/27 0605) Resp:  [16-24] 16 (02/27 0605) BP: (133-150)/(72-81) 150/77 mmHg (02/27 0605) SpO2:  [51 %-100 %] 98 % (02/27 0605) Last BM Date: 11/30/15 Filed Weights   11/27/15 2028  Weight: 102.6 kg (226 lb 3.1 oz)  temp 98.7 Hear Rate 82 resps 18  General: looks better, llooks well Heart: RRR Chest: clear bil.  No dyspnea or cough Abdomen: soft, ND, NT.  Active BS  Extremities: no CCE Neuro/Psych:  Pleasant, fully alert and oriented.  No gross deficits.   Intake/Output from previous day: 02/26 0701 - 02/27 0700 In: 1500 [I.V.:1500] Out: 560 [Urine:560]  Intake/Output this shift:    Lab Results:  Recent Labs  12/01/15 0647  WBC 3.5*  HGB 9.9*  HCT 30.1*  PLT 328   BMET  Recent Labs  12/01/15 0647  NA 140  K 4.0  CL 107  CO2 26  GLUCOSE 107*  BUN <5*  CREATININE 0.74  CALCIUM 8.5*   LFT  Recent Labs  12/01/15 0647  PROT 5.9*  ALBUMIN 2.3*  AST 26  ALT 30  ALKPHOS 227*  BILITOT 0.7   PT/INR No results for input(s): LABPROT, INR in the last 72 hours. Hepatitis Panel No results for input(s): HEPBSAG, HCVAB, HEPAIGM, HEPBIGM in the last 72 hours.  Studies/Results: No results found.  Scheduled Meds: . amoxicillin-clavulanate  1 tablet Oral Q12H  . antiseptic oral rinse  7 mL Mouth Rinse q12n4p  . chlorhexidine  15 mL Mouth Rinse BID  . enoxaparin (LOVENOX) injection  50 mg Subcutaneous Q24H  . fenofibrate  54 mg Oral Daily  . insulin aspart  0-20 Units Subcutaneous Q4H  . insulin glargine  35 Units Subcutaneous QHS  . metoCLOPramide (REGLAN) injection  10 mg  Intravenous 3 times per day  . ondansetron  4 mg Intravenous Q6H  . pantoprazole (PROTONIX) IV  40 mg Intravenous Q12H  . sodium chloride flush  3 mL Intravenous Q12H   Continuous Infusions: . dextrose 5 % and 0.45% NaCl 1,000 mL with potassium chloride 30 mEq infusion 125 mL/hr at 12/02/15 0320   PRN Meds:.acetaminophen **OR** acetaminophen, hydrALAZINE, oxyCODONE-acetaminophen, promethazine, promethazine   ASSESMENT:   *  Severe pancreatitis, due to ETOH and increased Triglycerides.  Initial presentation 10/17/15 but suspect it was present 11/2013.   Psuedocyst by 2/1 CT scan: smaller by 2/22 CT but pneumatosis (gastric vs SB vs colon).    Transitioned from Imipenem to Augmentin 2/26.   Remains on Q 12 IV PPI Suregery has cleared pt for discharge.   *  ? Diabetic gastroparesis.  Remains on scheduled IV Reglan.    *  Hypertriglyceridemia.  Compliant with outpt Tricor.    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  Will stop Reglan for now, write for prn po PRN.    *  Has PMD office at [facility] 3/2.  Dr Rhea Belton did initial eval 10/17/15,  so will arrange GI ROV in next several weeks.   *  complete total 10 days of abx (day 5 currently).     *  Pt reminded to stick to low fat diet, continue ETOH abstinence and not run out of Tricor.  Discussed Metoclopramide neuro s/e with pt's.  Strategy for this dosing this drug is to take AC/HS if he has frequent n/v, but if n/v is only every few days or less, can use the Reglan prn.     Jennye Moccasin  12/02/2015, 8:31 AM Pager: [phone]  GI ATTENDING  Interval history and data reviewed. Case discussed with Dr. Adela Lank. Agree with interval progress note. Patient ready for discharge. Local follow-up plans as outlined.  Wilhemina Bonito. Eda Keys., M.D. [facility] Division of Gastroenterology

## 2015-12-02 NOTE — Progress Notes (Signed)
  Subjective: Pt tol Reg po with no abd pain  Objective: Vital signs in last 24 hours: Temp:  [98.2 F (36.8 C)-99.1 F (37.3 C)] 98.7 F (37.1 C) (02/27 0605) Pulse Rate:  [53-82] 82 (02/27 0605) Resp:  [16-24] 16 (02/27 0605) BP: (133-150)/(72-81) 150/77 mmHg (02/27 0605) SpO2:  [51 %-100 %] 98 % (02/27 0605) Last BM Date: 11/30/15  Intake/Output from previous day: 02/26 0701 - 02/27 0700 In: 1500 [I.V.:1500] Out: 560 [Urine:560] Intake/Output this shift:    General appearance: alert and cooperative GI: soft, non-tender; bowel sounds normal; no masses,  no organomegaly  Lab Results:   Recent Labs  12/01/15 0647  WBC 3.5*  HGB 9.9*  HCT 30.1*  PLT 328   BMET  Recent Labs  12/01/15 0647  NA 140  K 4.0  CL 107  CO2 26  GLUCOSE 107*  BUN <5*  CREATININE 0.74  CALCIUM 8.5*   PT/INR No results for input(s): LABPROT, INR in the last 72 hours. ABG No results for input(s): PHART, HCO3 in the last 72 hours.  Invalid input(s): PCO2, PO2  Studies/Results: No results found.  Anti-infectives: Anti-infectives    Start     Dose/Rate Route Frequency Ordered Stop   12/01/15 1500  amoxicillin-clavulanate (AUGMENTIN) 875-125 MG per tablet 1 tablet     1 tablet Oral Every 12 hours 12/01/15 1430     11/28/15 2000  imipenem-cilastatin (PRIMAXIN) 500 mg in sodium chloride 0.9 % 100 mL IVPB  Status:  Discontinued     500 mg 200 mL/hr over 30 Minutes Intravenous Every 6 hours 11/28/15 1607 12/01/15 1430   11/27/15 1715  imipenem-cilastatin (PRIMAXIN) 500 mg in sodium chloride 0.9 % 100 mL IVPB  Status:  Discontinued     500 mg 200 mL/hr over 30 Minutes Intravenous 3 times per day 11/27/15 1622 11/28/15 1607   11/27/15 1530  ciprofloxacin (CIPRO) IVPB 400 mg  Status:  Discontinued     400 mg 200 mL/hr over 60 Minutes Intravenous Every 12 hours 11/27/15 1519 11/27/15 1622   11/27/15 1530  metroNIDAZOLE (FLAGYL) IVPB 500 mg  Status:  Discontinued     500 mg 100 mL/hr  over 60 Minutes Intravenous Every 8 hours 11/27/15 1519 11/27/15 1622      Assessment/Plan: Recurrent pancreatitis with possible duodenal microperforation  OK for home with PO abx from surgery standpoint   LOS: 5 days    Marigene Ehlers., [facility] 12/02/2015

## 2015-12-02 NOTE — Discharge Summary (Addendum)
Physician Discharge Summary  Brent Costa ATF:[id number] DOB: November 06, 1990 DOA: 11/27/2015  PCP: Angelica Chessman, MD  Admit date: 11/27/2015 Discharge date: 12/02/2015  Time spent: > 30 minutes  Recommendations for Outpatient Follow-up:  1. Follow-up with primary care provider in one to 2 weeks 2. Follow up with Dr. Hilarie Fredrickson as needed   Discharge Diagnoses:  Active Problems:   Uncontrolled diabetes mellitus without complication (HCC)   Essential hypertension   Nausea and vomiting   Pancreatic pseudocyst   AKI (acute kidney injury) ([place])   DKA (diabetic ketoacidoses) (HCC)   Rash   Pseudocyst of pancreas   Abnormal CT of the abdomen   Other acute pancreatitis  Discharge Condition: stable  Diet recommendation: diabetic   Filed Weights   11/27/15 2028  Weight: 102.6 kg (226 lb 3.1 oz)    History of present illness:  See H&P, Labs, Consult and Test reports for all details in brief, patient is a 25 yo M with recurrent pancreatitis admitted on 2/22 with abdominal pain, nausea, vomiting, found to be in DKA. CT scan of abdomen and pelvis on admission showed microperforations presumably of the stomach or duodenum. Gastroenterology and general surgery for consulted on patient's admission  Hospital Course:  DKA - Patient was initiated on insulin drip on admission as well as IV fluids, his DKA resolved, he is now off of insulin infusion and he was restarted on 35 units of Lantus daily. He CBGs have been well-controlled, he was able to eat well, he confirmed that he has enough Lantus at home however he needed a prescription of NovoLog which has been given to him. He needs to follow-up as an outpatient for further management of his diabetes.  Acute on chronic pancreatitis with gastric and duodenal microperforations, as well as pancreatic pseudocyst - CT scan of the abdomen and pelvis as below, Gen. surgery and gastroenterology were consulted and have followed patient will hospitalized.  He improved with empiric IV antibiotics and supportive treatment, he was initially nothing by mouth, and as he was improving his diet was slowly advanced to clears, fulls and then  eventually he was able to tolerate a regular diet. His antibiotics were narrowed to Augmentin, and he is to complete a 10 day course with 7 additional days remaining.  Acute kidney injury - Like in the setting of dehydration with nausea vomiting and poor by mouth intake over the last few days, improved with IV fluids Hypokalemia - In the setting of DKA treated with insulin as well as poor by mouth intake. Improved Rash - ongoing for months. Multiple treatments in the past including for scabies w/o imrpovement. Most recently started on steroid cream w/ improvement. Outpatient management  Hypertriglyceridemia - As high as 3000 at the beginning of January, now his triglycerides are 223.  Procedures:  None    Consultations:  General surgery   Gastroenterology   Discharge Exam: Filed Vitals:   12/01/15 2327 12/02/15 0254 12/02/15 0424 12/02/15 0605  BP: 143/79 145/81 139/72 150/77  Pulse: 81  53 82  Temp: 98.2 F (36.8 C) 98.2 F (36.8 C) 98.3 F (36.8 C) 98.7 F (37.1 C)  TempSrc: Oral Oral Oral Oral  Resp: '18 18 18 16  ' Height:      Weight:      SpO2: 100%  51% 98%   General: NAD Cardiovascular: RRR Respiratory: CTA biL  Discharge Instructions Activity:  As tolerated   Get Medicines reviewed and adjusted: Please take all your medications with you for  your next visit with your Primary MD  Please request your Primary MD to go over all hospital tests and procedure/radiological results at the follow up, please ask your Primary MD to get all Hospital records sent to his/her office.  If you experience worsening of your admission symptoms, develop shortness of breath, life threatening emergency, suicidal or homicidal thoughts you must seek medical attention immediately by calling 911 or calling your MD  immediately if symptoms less severe.  You must read complete instructions/literature along with all the possible adverse reactions/side effects for all the Medicines you take and that have been prescribed to you. Take any new Medicines after you have completely understood and accpet all the possible adverse reactions/side effects.   Do not drive when taking Pain medications.   Do not take more than prescribed Pain, Sleep and Anxiety Medications  Special Instructions: If you have smoked or chewed Tobacco in the last 2 yrs please stop smoking, stop any regular Alcohol and or any Recreational drug use.  Wear Seat belts while driving.  Please note  You were cared for by a hospitalist during your hospital stay. Once you are discharged, your primary care physician will handle any further medical issues. Please note that NO REFILLS for any discharge medications will be authorized once you are discharged, as it is imperative that you return to your primary care physician (or establish a relationship with a primary care physician if you do not have one) for your aftercare needs so that they can reassess your need for medications and monitor your lab values.    Medication List    TAKE these medications        ACCU-CHEK AVIVA PLUS w/Device Kit  Check blood sugars 4 times daily (before meals and at bedtime)     accu-chek softclix lancets  Use as instructed     acetaminophen 325 MG tablet  Commonly known as:  TYLENOL  Take 2 tablets (650 mg total) by mouth every 6 (six) hours as needed for mild pain (or Fever >/= 101).     amoxicillin-clavulanate 875-125 MG tablet  Commonly known as:  AUGMENTIN  Take 1 tablet by mouth every 12 (twelve) hours.     fenofibrate 145 MG tablet  Commonly known as:  TRICOR  Take 1 tablet (145 mg total) by mouth daily.     glucose blood test strip  Commonly known as:  ACCU-CHEK AVIVA  Use as instructed     insulin aspart 100 UNIT/ML injection  Commonly  known as:  novoLOG  Inject 3-10 Units into the skin 3 (three) times daily. Blood Glucose 150 - 200, give 3 units Blood Glucose 201 - 250, give 5 units Blood Glucose 251 - 300, give 7 units Blood Glucose 301 - 350, give 10 units     Insulin Glargine 100 UNIT/ML Solostar Pen  Commonly known as:  LANTUS  Inject 45 Units into the skin daily at 10 pm.     lisinopril 10 MG tablet  Commonly known as:  PRINIVIL,ZESTRIL  Take 1 tablet (10 mg total) by mouth daily.     metFORMIN 500 MG tablet  Commonly known as:  GLUCOPHAGE  Take 2 tablets (1,000 mg total) by mouth 2 (two) times daily with a meal.     metoCLOPramide 5 MG tablet  Commonly known as:  REGLAN  Take 1 tablet (5 mg total) by mouth 3 (three) times daily before meals.     omeprazole 40 MG capsule  Commonly known as:  PRILOSEC  Take 1 capsule (40 mg total) by mouth daily.     oxyCODONE-acetaminophen 5-325 MG tablet  Commonly known as:  PERCOCET/ROXICET  Take 1-2 tablets by mouth every 4 (four) hours as needed for moderate pain or severe pain.     promethazine 25 MG tablet  Commonly known as:  PHENERGAN  Take 1 tablet (25 mg total) by mouth every 8 (eight) hours as needed for nausea or vomiting.           Follow-up Information    Follow up with [facility] [facility].   Why:  You need to buy a meter and use it multiple times a day as directed by your doctor to check your blood sugar.   Contact information:   Product/process development scientist Can be purchased without a prescription Least expensive meter available      Follow up with [facility] on 12/05/2015.   Why:  at 3:30 pm. Please bring your ID and your blood sugar readings to appointment.   Contact information:   [address] [place] [address] [phone]      Follow up with Jerene Bears, MD.   Specialty:  Gastroenterology   Why:  As needed, If symptoms worsen   Contact information:   [address]  [address] [phone]       The results of significant diagnostics from this hospitalization (including imaging, microbiology, ancillary and laboratory) are listed below for reference.    Significant Diagnostic Studies: Ct Abdomen Pelvis W Contrast  11/27/2015  ADDENDUM REPORT: 11/27/2015 15:14 ADDENDUM: Findings conveyed toBrian Miller, and heon 11/27/2015  at14:14. Electronically Signed   By: Suzy Bouchard M.D.   On: 11/27/2015 15:14  11/27/2015  CLINICAL DATA:  Pancreatitis with pseudocyst formation. Persists abdominal pain. EXAM: CT ABDOMEN AND PELVIS WITH CONTRAST TECHNIQUE: Multidetector CT imaging of the abdomen and pelvis was performed using the standard protocol following bolus administration of intravenous contrast. CONTRAST:  15m OMNIPAQUE IOHEXOL 300 MG/ML  SOLN COMPARISON:  CT 11/06/2015, 10/21/2015 FINDINGS: Lower chest: Lung bases are clear. Hepatobiliary: No focal hepatic lesion. No intrahepatic biliary duct dilatation. The common bile duct appears normal. The gallbladder is normal. Pancreas: Body and tail the pancreas are normal. The head of the pancreas is indistinct. There is extensive inflammation surrounding head of the pancreas. Small gas locules adjacent to the head of the pancreas. Pseudocyst inferior to the pancreatic head and third portion the duodenum is decreased in size measuring 4.7 by 1.7 cm decreased from 6.7 by 2.5 cm (image 53, series 2). There is extensive inflammation along the second portion duodenum. There small gas locules in the porta hepatis. Spleen: Normal spleen Adrenals/urinary tract: Adrenal glands and kidneys are normal. The ureters and bladder normal. Stomach/Bowel: Proximal stomach is normal. There is inflammation in the gastric antrum/ pyloric region. Extensive inflammation along the second portion the duodenum, gallbladder fossa and extending into the Morrison's pouch along the ascending colon. Several small loculated gas along the second portion duodenum,  gallbladder fossa, Morrison's pouch, and ascending colon. No large volume intraperitoneal free air. Vascular/Lymphatic: Abdominal aorta is normal caliber. There is no retroperitoneal or periportal lymphadenopathy. No pelvic lymphadenopathy. Reproductive: Prostate normal. Small amount free fluid in the pelvis. Free fluid is similar prior. Other: Small amount free fluid the pelvis Musculoskeletal: No aggressive osseous lesion. IMPRESSION: 1. Multiple small loculations of gas along the second portion duodenum and extending into Morrison's pouch. Findings consistent with MICRO PERFORATION  presumably of the stomach or duodenum and less likely hepatic flexure of the colon. Differential would include post pancreatitis perforation, necrotizing infection versus iatrogenic (query recent endoscopy). 2. Extensive inflammation along the second portion duodenum, head of the pancreas, and Morrison's pouch. Degree of inflammation is similar to comparison exam. The fluid collections are decreased in volume. 3. Inflammation along the hepatic flexure of the colon likely secondary to the prior pancreatitis. Electronically Signed: By: Suzy Bouchard M.D. On: 11/27/2015 13:56   Ct Abdomen Pelvis W Contrast  11/06/2015  CLINICAL DATA:  Left upper quadrant pain with epigastric pain and nausea for about a week now. Previous history of pancreatitis. EXAM: CT ABDOMEN AND PELVIS WITH CONTRAST TECHNIQUE: Multidetector CT imaging of the abdomen and pelvis was performed using the standard protocol following bolus administration of intravenous contrast. CONTRAST:  171m OMNIPAQUE IOHEXOL 300 MG/ML  SOLN COMPARISON:  10/21/2015 FINDINGS: Lower chest: Trace atelectasis right lung base. Gynecomastia noted bilaterally. Hepatobiliary: No focal abnormality within the liver parenchyma. There is no evidence for gallstones, gallbladder wall thickening, or pericholecystic fluid. No intrahepatic or extrahepatic biliary dilation. Pancreas: As on prior  studies, there is peripancreatic edema and inflammation. Features have progressed in the interval and the phlegmonous change seen on the previous study has become more organized with an evolving 5.8 x 7.8 x 11.1 cm complex fluid collection anterior to the pancreatic head showing areas of rim enhancement. 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 There is some hypoattenuation in the uncinate process of the pancreas likely related to edema/ inflammation. No dilatation of the main pancreatic duct. Pancreatic parenchyma continues to enhance diffusely. Edema/ inflammation tracks in the anterior para renal space of the retroperitoneal abdomen. Spleen: No focal mass lesion. No dilatation of the main duct. No intraparenchymal cyst. No splenomegaly. No focal mass lesion. Adrenals/Urinary Tract: No adrenal nodule or mass. Kidneys are unremarkable. No evidence for hydroureter. The urinary bladder appears normal for the degree of distention. Stomach/Bowel: Mild thickening in the wall of the gastric antrum is assisted with duodenal wall thickening, changes likely secondary to the retroperitoneal edema/ inflammation. No evidence for gastric outlet obstruction at this time. No small bowel wall thickening. No small bowel dilatation. The terminal ileum is normal. High density material in the appendiceal lumen is new since 10/16/2015 and compatible with oral contrast material from previous CT scan. There is some thickening in the wall of the hepatic flexure, adjacent to the peripancreatic inflammatory process. Left colon is normal in appearance. Vascular/Lymphatic: No abdominal aortic aneurysm. No abdominal atherosclerotic calcification. Portal vein and superior mesenteric vein are patent. Splenic vein remains patent. No evidence for celiac axis or SMA pseudoaneurysm. Persistent left-sided IVC noted. Borderline enlarged lymph nodes are seen in the root of the small bowel mesentery and in the peripancreatic  tissues, compatible with the inflammatory process described above. No pelvic sidewall lymphadenopathy. Reproductive: The prostate gland and seminal vesicles have normal imaging features. Other: Trace intraperitoneal free fluid noted. Musculoskeletal: Bone windows reveal no worrisome lytic or sclerotic osseous lesions. IMPRESSION: Interval evolution of the peripancreatic edema/ inflammation with evolving pseudocyst now evident anterior to the head of the pancreas. No evidence for pancreatic necrosis at this time although there is some hypo attenuation in the uncinate process of the pancreas, likely related to edema/inflammation. No evidence for vascular complication in the upper abdomen. Electronically Signed   By: EMisty StanleyM.D.   On: 11/06/2015 15:37   Dg Chest Port 1 View  11/10/2015  CLINICAL DATA:  Fever EXAM: PORTABLE CHEST 1 VIEW COMPARISON:  10/18/2015 FINDINGS: The heart size and mediastinal contours are within normal limits. Both lungs are clear. The visualized skeletal structures are unremarkable. IMPRESSION: No active disease. Electronically Signed   By: Franchot Gallo M.D.   On: 11/10/2015 10:17    Microbiology: Recent Results (from the past 240 hour(s))  Culture, blood (routine x 2)     Status: None (Preliminary result)   Collection Time: 11/27/15  3:28 PM  Result Value Ref Range Status   Specimen Description BLOOD RIGHT ANTECUBITAL  Final   Special Requests BOTTLES DRAWN AEROBIC AND ANAEROBIC 5CC  Final   Culture NO GROWTH 4 DAYS  Final   Report Status PENDING  Incomplete  Culture, blood (routine x 2)     Status: None (Preliminary result)   Collection Time: 11/27/15  3:35 PM  Result Value Ref Range Status   Specimen Description BLOOD RIGHT HAND  Final   Special Requests BOTTLES DRAWN AEROBIC ONLY 5CC  Final   Culture NO GROWTH 4 DAYS  Final   Report Status PENDING  Incomplete     Labs: Basic Metabolic Panel:  Recent Labs Lab 11/26/15 2349 11/27/15 0937 11/27/15 1409  11/27/15 1640 11/27/15 2120 11/28/15 0039 11/28/15 0503 11/29/15 0540 12/01/15 0647  NA 135 137 137 136 139 137 138 137 140  K 2.8* 3.3* 4.0 3.3* 2.8* 2.7* 3.1* 3.0* 4.0  CL 79* 88* 91* 93* 93* 96* 97* 99* 107  CO2 33* 36* 32 27 33* '29 31 30 26  ' GLUCOSE 249* 280* 140* 149* 142* 180* 144* 94 107*  BUN '19 17 15 13 10 10 8 ' <5* <5*  CREATININE 2.25* 1.82* 1.53* 1.38* 1.32* 1.11 1.09 0.76 0.74  CALCIUM 9.9 8.3* 8.3* 8.1* 8.3* 8.0* 8.2* 8.6* 8.5*  MG  --  1.8  --   --   --   --   --   --   --   PHOS  --  4.1  --   --   --   --   --   --   --    Liver Function Tests:  Recent Labs Lab 11/26/15 2349 11/28/15 0503 11/29/15 0540 12/01/15 0647  AST 13* 14* 109* 26  ALT 10* 8* 47 30  ALKPHOS 105 60 223* 227*  BILITOT 1.0 0.3 1.2 0.7  PROT 8.5* 5.5* 6.2* 5.9*  ALBUMIN 3.7 2.3* 2.4* 2.3*    Recent Labs Lab 11/26/15 2349 11/27/15 1640 11/28/15 0503 12/01/15 0647  LIPASE 35 '30 29 20   ' No results for input(s): AMMONIA in the last 168 hours. CBC:  Recent Labs Lab 11/26/15 2349 11/27/15 0937 11/28/15 0503 11/29/15 0540 12/01/15 0647  WBC 9.9 6.3 5.8 4.4 3.5*  HGB 13.6 10.9* 10.1* 10.3* 9.9*  HCT 41.1 33.6* 31.9* 30.6* 30.1*  MCV 80.6 82.4 80.6 80.1 78.6  PLT 503* 332 322 293 328   CBG:  Recent Labs Lab 12/01/15 1855 12/01/15 2009 12/01/15 2325 12/02/15 0420 12/02/15 0750  GLUCAP 121* 157* 131* 95 96       Signed:  Dorothymae Maciver  [facility] 12/02/2015, 5:39 PM

## 2015-12-03 LAB — CULTURE, BLOOD (ROUTINE X 2)
CULTURE: NO GROWTH
Culture: NO GROWTH

## 2015-12-05 ENCOUNTER — Inpatient Hospital Stay: Payer: Self-pay | Admitting: Internal Medicine

## 2016-02-13 ENCOUNTER — Ambulatory Visit: Payer: Self-pay | Admitting: Internal Medicine

## 2016-04-21 ENCOUNTER — Telehealth: Payer: Self-pay | Admitting: Internal Medicine

## 2016-04-21 DIAGNOSIS — E119 Type 2 diabetes mellitus without complications: Secondary | ICD-10-CM

## 2016-04-21 MED ORDER — INSULIN GLARGINE 100 UNIT/ML SOLOSTAR PEN: 45.0000 [IU] | PEN_INJECTOR | Freq: Every day | SUBCUTANEOUS | Status: DC

## 2016-04-21 MED ORDER — INSULIN ASPART 100 UNIT/ML ~~LOC~~ SOLN: SUBCUTANEOUS | Status: DC

## 2016-04-21 NOTE — Telephone Encounter (Signed)
Pt called requesting medication refill for his insulin, please f/up

## 2016-04-21 NOTE — Telephone Encounter (Signed)
Insulin refilled, patient needs office visit for more refills

## 2016-05-15 ENCOUNTER — Telehealth: Payer: Self-pay | Admitting: Internal Medicine

## 2016-05-15 DIAGNOSIS — E119 Type 2 diabetes mellitus without complications: Secondary | ICD-10-CM

## 2016-05-15 MED ORDER — INSULIN GLARGINE 100 UNIT/ML SOLOSTAR PEN: 45.0000 [IU] | PEN_INJECTOR | Freq: Every day | SUBCUTANEOUS | 0 refills | Status: DC

## 2016-05-15 MED ORDER — INSULIN ASPART 100 UNIT/ML ~~LOC~~ SOLN: SUBCUTANEOUS | 0 refills | Status: DC

## 2016-05-15 MED FILL — !NOVOLOG 100UNITS/ML VIAL: 100/ML | 28 days supply | Qty: 10 | Fill #0

## 2016-05-15 MED FILL — !LANTUS SOLOSTAR 100UNITS/M: 100 | 33 days supply | Qty: 15 | Fill #0

## 2016-05-15 NOTE — Telephone Encounter (Signed)
Pt. Called requesting in a refill on the following medications:   Insulin Glargine (LANTUS) 100 UNIT/ML Solostar Pen  insulin aspart (NOVOLOG) 100 UNIT/ML injection  Please f/u

## 2016-05-15 NOTE — Telephone Encounter (Signed)
Insulin refilled but patient must have office visit for any further refills.

## 2016-06-16 ENCOUNTER — Other Ambulatory Visit: Payer: Self-pay | Admitting: *Deleted

## 2016-06-16 DIAGNOSIS — E119 Type 2 diabetes mellitus without complications: Secondary | ICD-10-CM

## 2016-06-16 MED ORDER — INSULIN GLARGINE 100 UNIT/ML SOLOSTAR PEN: 45.0000 [IU] | PEN_INJECTOR | Freq: Every day | SUBCUTANEOUS | 3 refills | Status: DC

## 2016-06-16 NOTE — Telephone Encounter (Signed)
PRINTED FOR PASS PROGRAM 

## 2016-09-09 IMAGING — CT CT ABD-PELV W/ CM
2 of 4 series · 8 of 46 positions shown, 9 images · IV contrast (Iodine)
Comparison: 10/21/2015

CLINICAL DATA: Left upper quadrant pain with epigastric pain and
nausea for about a week now. Previous history of pancreatitis.

EXAM:
CT ABDOMEN AND PELVIS WITH CONTRAST
TECHNIQUE: Multidetector CT imaging of the abdomen and pelvis was performed
using the standard protocol following bolus administration of
intravenous contrast.
CONTRAST:  100mL OMNIPAQUE IOHEXOL 300 MG/ML  SOLN

[Series 201: routine, idose (2) · axial · 0.78mm/px · z∈[-616,-206]mm · 5 of 110 slices shown, 6 images]
[im 14/110  soft-tissue]
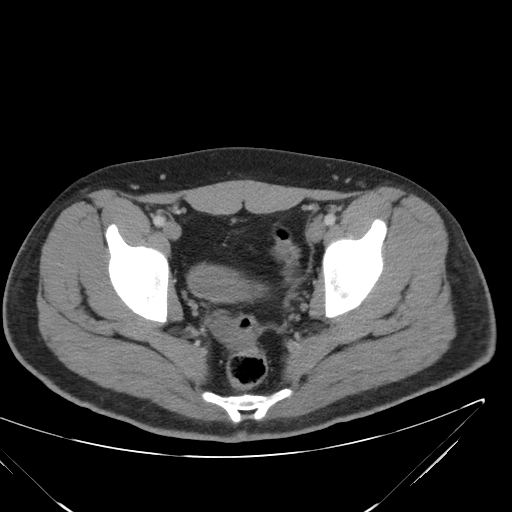
[im 14/110  bone]
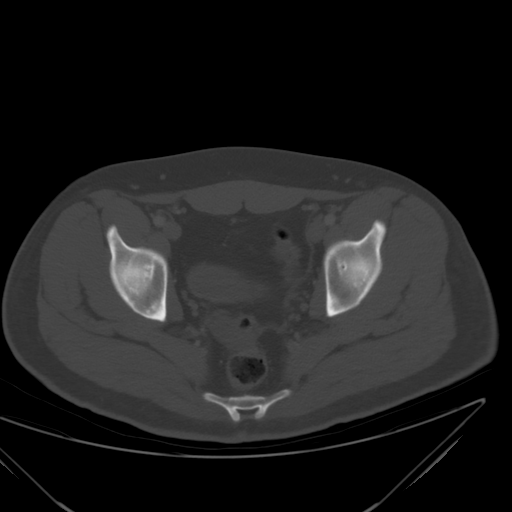
[im 32/110  soft-tissue]
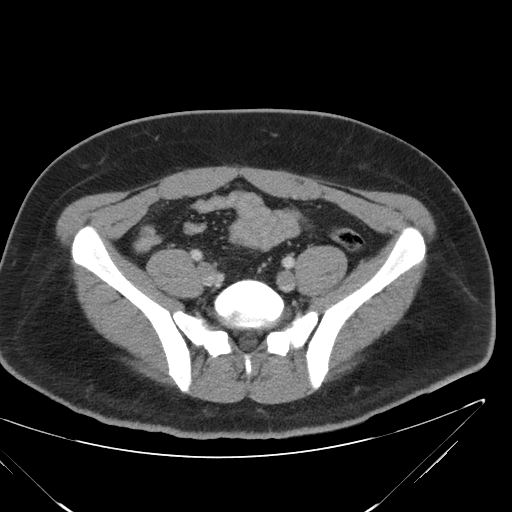
[im 55/110  soft-tissue]
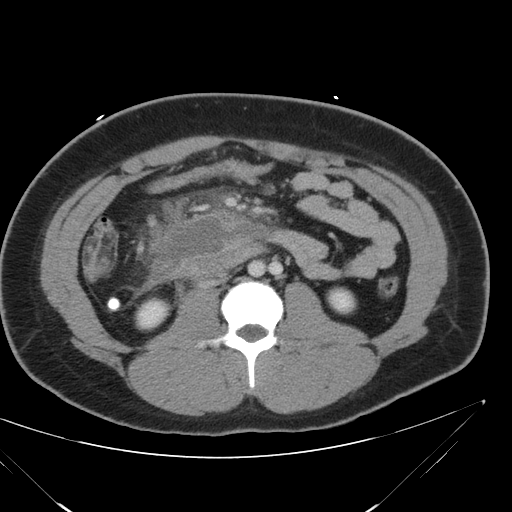
[im 78/110  soft-tissue]
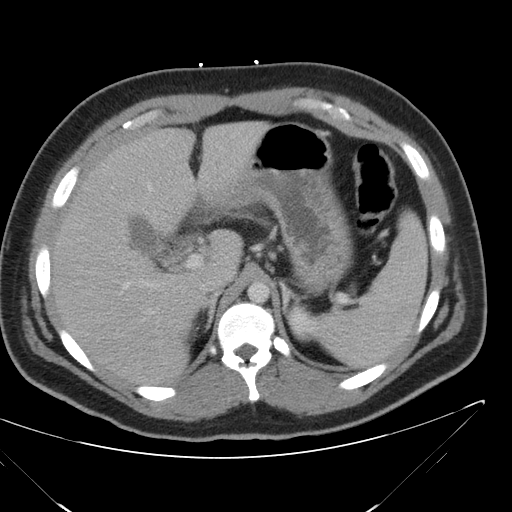
[im 96/110  soft-tissue]
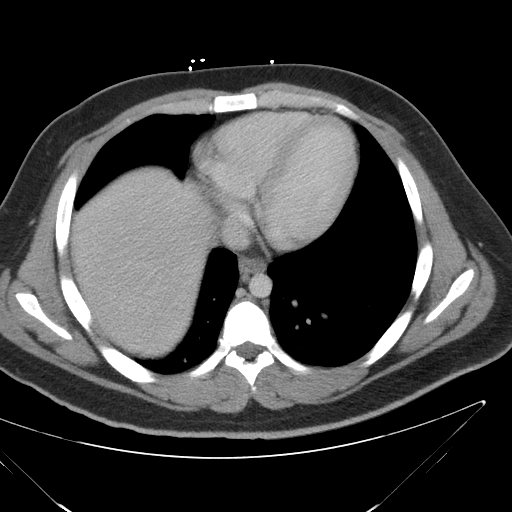

[Series 203: coronals, idose (2) · coronal · 0.45mm/px · 3 of 112 slices shown]
[im 38/112  soft-tissue]
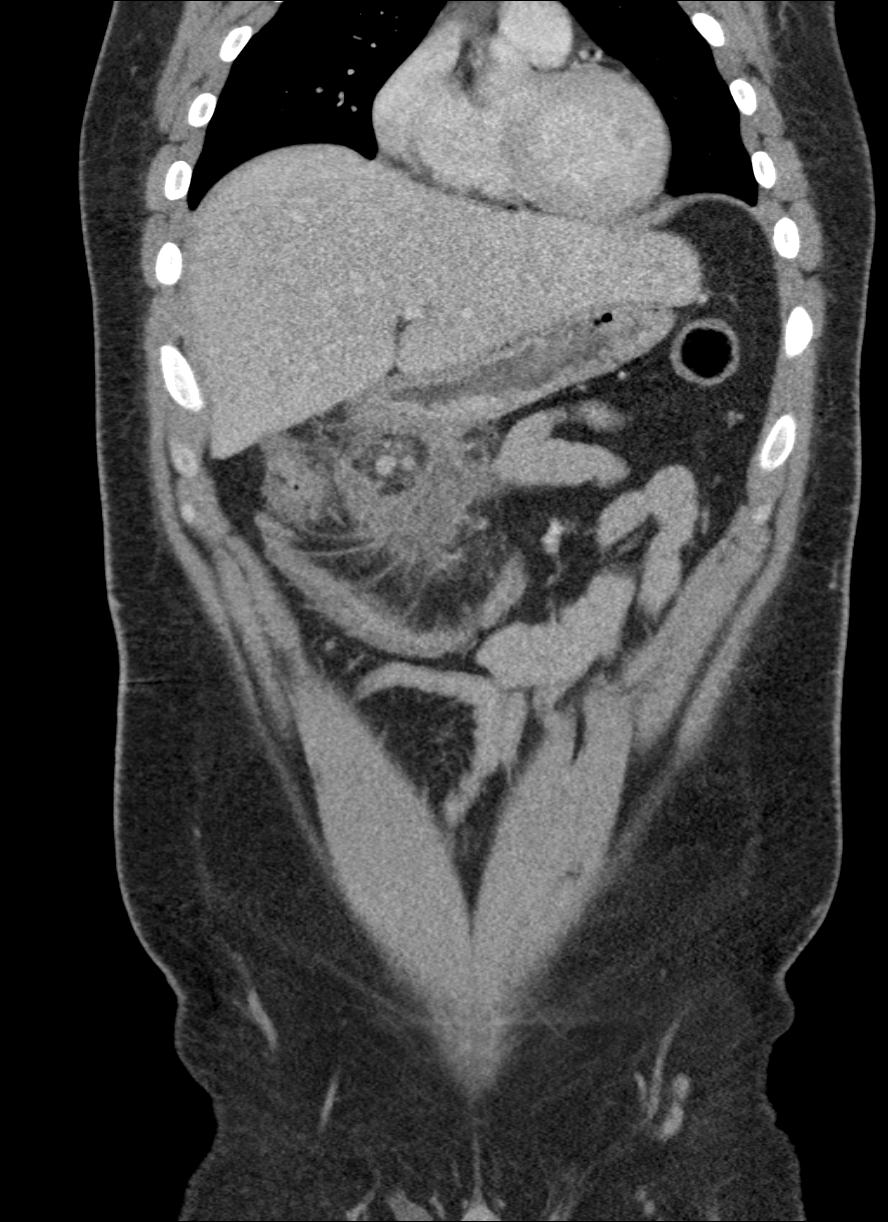
[im 50/112  soft-tissue]
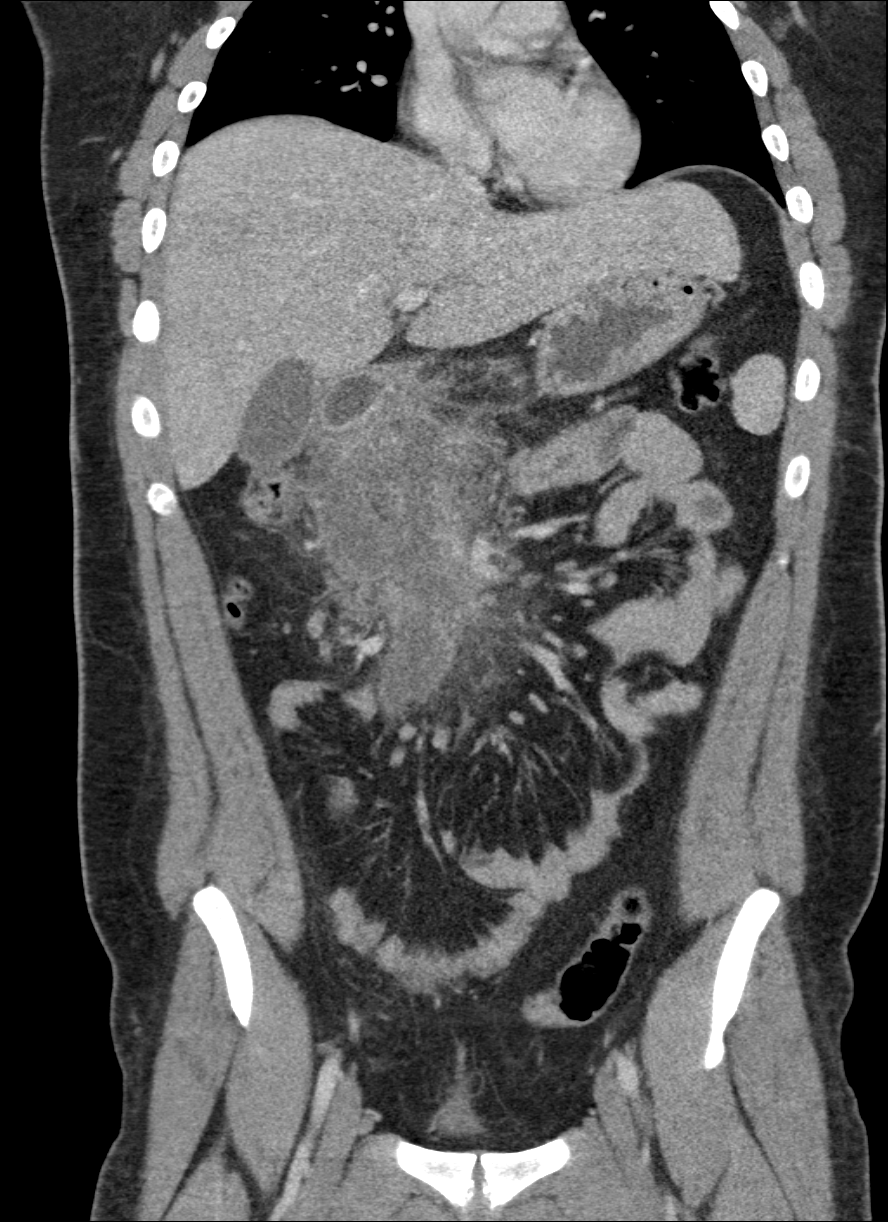
[im 62/112  soft-tissue]
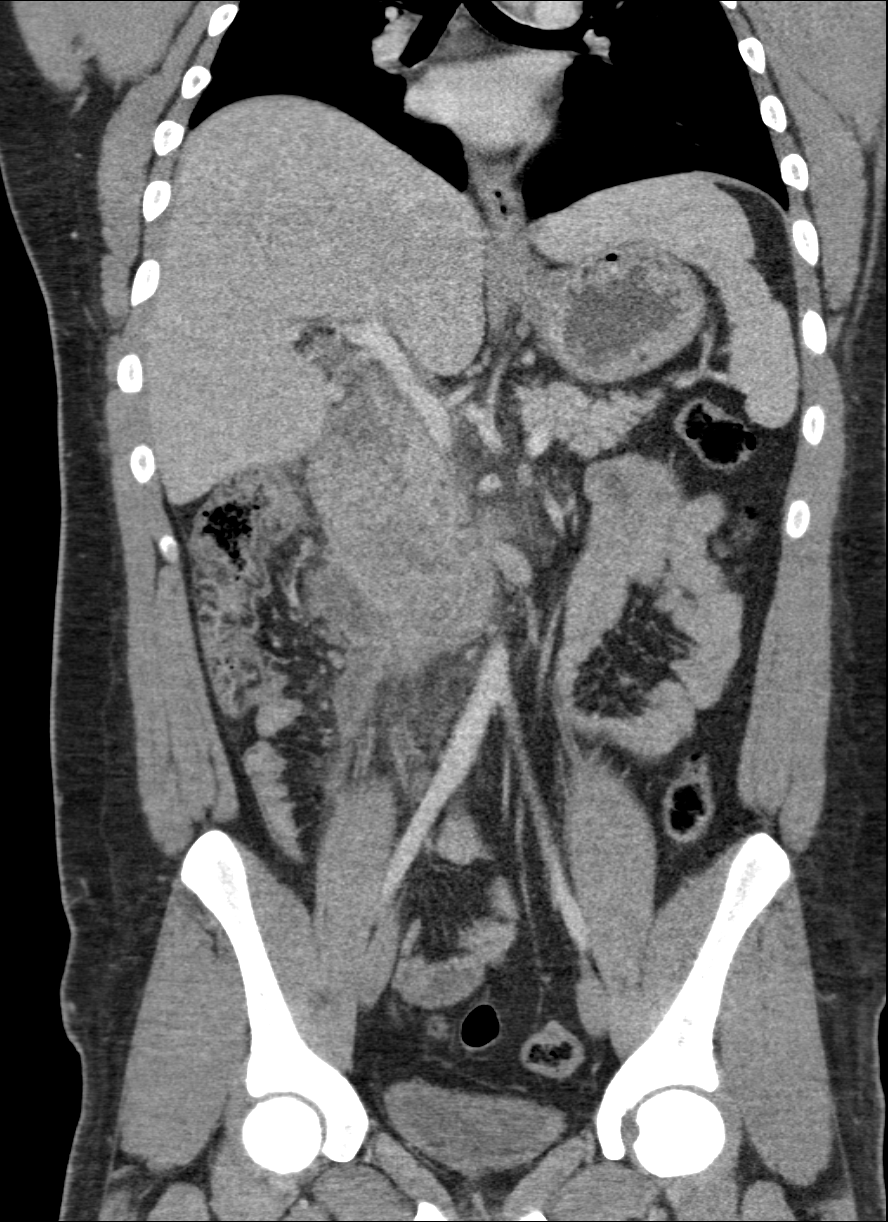

[8 of 46 positions shown; findings below may reference images not displayed]

FINDINGS: Lower chest: Trace atelectasis right lung base. Gynecomastia noted
bilaterally.

Hepatobiliary: No focal abnormality within the liver parenchyma.
There is no evidence for gallstones, gallbladder wall thickening, or
pericholecystic fluid. No intrahepatic or extrahepatic biliary
dilation.

Pancreas: As on prior studies, there is peripancreatic edema and
inflammation. Features have progressed in the interval and the
phlegmonous change seen on the previous study has become more
organized with an evolving 5.8 x 7.8 x 11.1 cm complex fluid
collection anterior to the pancreatic head showing areas of rim
enhancement. Imaging features are compatible with evolving
pancreatic pseudocyst. There is some hypoattenuation in the uncinate
process of the pancreas likely related to edema/ inflammation. No
dilatation of the main pancreatic duct. Pancreatic parenchyma
continues to enhance diffusely. Edema/ inflammation tracks in the
anterior para renal space of the retroperitoneal abdomen.

Spleen: No focal mass lesion. No dilatation of the main duct. No
intraparenchymal cyst. No splenomegaly. No focal mass lesion.

Adrenals/Urinary Tract: No adrenal nodule or mass. Kidneys are
unremarkable. No evidence for hydroureter. The urinary bladder
appears normal for the degree of distention.

Stomach/Bowel: Mild thickening in the wall of the gastric antrum is
assisted with duodenal wall thickening, changes likely secondary to
the retroperitoneal edema/ inflammation. No evidence for gastric
outlet obstruction at this time. No small bowel wall thickening. No
small bowel dilatation. The terminal ileum is normal. High density
material in the appendiceal lumen is new since 10/16/2015 and
compatible with oral contrast material from previous CT scan. There
is some thickening in the wall of the hepatic flexure, adjacent to
the peripancreatic inflammatory process. Left colon is normal in
appearance.

Vascular/Lymphatic: No abdominal aortic aneurysm. No abdominal
atherosclerotic calcification. Portal vein and superior mesenteric
vein are patent. Splenic vein remains patent. No evidence for celiac
axis or SMA pseudoaneurysm. Persistent left-sided IVC noted.
Borderline enlarged lymph nodes are seen in the root of the small
bowel mesentery and in the peripancreatic tissues, compatible with
the inflammatory process described above. No pelvic sidewall
lymphadenopathy.

Reproductive: The prostate gland and seminal vesicles have normal
imaging features.

Other: Trace intraperitoneal free fluid noted.

Musculoskeletal: Bone windows reveal no worrisome lytic or sclerotic
osseous lesions.
IMPRESSION: Interval evolution of the peripancreatic edema/ inflammation with
evolving pseudocyst now evident anterior to the head of the
pancreas. No evidence for pancreatic necrosis at this time although
there is some hypo attenuation in the uncinate process of the
pancreas, likely related to edema/inflammation. No evidence for
vascular complication in the upper abdomen.

## 2017-02-28 ENCOUNTER — Encounter (HOSPITAL_COMMUNITY): Payer: Self-pay | Admitting: *Deleted

## 2017-02-28 ENCOUNTER — Emergency Department (HOSPITAL_COMMUNITY)
Admission: EM | Admit: 2017-02-28 | Discharge: 2017-02-28 | Disposition: A | Discharge status: Home / Self Care | Payer: Self-pay | Attending: Emergency Medicine | Admitting: Emergency Medicine

## 2017-02-28 DIAGNOSIS — Z87891 Personal history of nicotine dependence: Secondary | ICD-10-CM | POA: Insufficient documentation

## 2017-02-28 DIAGNOSIS — M79672 Pain in left foot: Secondary | ICD-10-CM | POA: Insufficient documentation

## 2017-02-28 DIAGNOSIS — Z79899 Other long term (current) drug therapy: Secondary | ICD-10-CM | POA: Insufficient documentation

## 2017-02-28 DIAGNOSIS — I10 Essential (primary) hypertension: Secondary | ICD-10-CM | POA: Insufficient documentation

## 2017-02-28 DIAGNOSIS — Z794 Long term (current) use of insulin: Secondary | ICD-10-CM | POA: Insufficient documentation

## 2017-02-28 DIAGNOSIS — E1110 Type 2 diabetes mellitus with ketoacidosis without coma: Secondary | ICD-10-CM | POA: Insufficient documentation

## 2017-02-28 NOTE — ED Triage Notes (Signed)
Pt reports pain to bottom of his foot, around the arch area.pain radiates into his leg. Denies any injury.

## 2017-02-28 NOTE — ED Provider Notes (Signed)
[facility] DEPT Provider Note   CSN: [id number] Arrival date & time: 02/28/17  1743   By signing my name below, I, Mayer Masker, attest that this documentation has been prepared under the direction and in the presence of Quintella Reichert, MD. Electronically Signed: Mayer Masker, Scribe. 02/28/17. 7:48 PM.  History   Chief Complaint Chief Complaint  Patient presents with  . Foot Pain   The history is provided by the patient. No language interpreter was used.    HPI Comments: Brent Costa is a 26 y.o. male with a PMHx of DM2 who presents to the Emergency Department complaining of constant, gradually worsening left-sided foot pain for 3 weeks. Pain is worse with walking.  For the last 1-2 days it has radiated up to his left hip.  He denies fevers, nausea, vomiting, diarrhea, CP, and SOB. Pt is a Engineer, building services guard for work. He does not take his DM medications. He has an appointment with his PCP on June 2nd.  Past Medical History:  Diagnosis Date  . Chronic bronchitis ([place])    "get it about q year" (11/20/2013)  . Chronic bronchitis ([place])   . Chronic pancreatitis ([place]) 10/2015   likely present 11/2013 but no lipase at that time.  due to elevated triglycerides and ETOH  . GERD (gastroesophageal reflux disease)   . Hepatic steatosis 11/2013   seen on CT, LFTs normal.  . Hypertension   . Hypertriglyceridemia   . Microalbuminuria   . Molluscum contagiosum 06/2015   upper extremities.   . Obesity   . Type II diabetes mellitus ([place]) dx'd 11/2013   oral agents and insulin    Patient Active Problem List   Diagnosis Date Noted  . Other acute pancreatitis   . Pseudocyst of pancreas   . Abnormal CT of the abdomen   . AKI (acute kidney injury) ([place]) 11/27/2015  . DKA (diabetic ketoacidoses) ([place]) 11/27/2015  . Rash 11/27/2015  . Gastroparesis   . Obesity (BMI 30-39.9) 11/15/2015  . Chronic pancreatitis ([place]) 11/13/2015  . 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 Abdominal pain  11/09/2015  . Nausea and vomiting 11/09/2015  . Pancreatitis 11/09/2015  . Acute kidney injury ([place]) 11/09/2015  . Tobacco use 11/09/2015  . Essential hypertension 10/31/2015  . Gastroesophageal reflux disease without esophagitis 10/31/2015  . Rash and nonspecific skin eruption 10/31/2015  . High triglycerides   . Fever, unspecified   . Uncontrolled diabetes mellitus without complication ([place]) 26/37/8588  . Molluscum contagiosum 10/16/2015  . Pancreatitis, acute   . Hypertriglyceridemia 06/25/2015  . Hypertension 05/23/2015  . Diabetes ([place]) 01/11/2014    Past Surgical History:  Procedure Laterality Date  . ESOPHAGOGASTRODUODENOSCOPY N/A 11/21/2013   Procedure: ESOPHAGOGASTRODUODENOSCOPY (EGD);  Surgeon: Wonda Horner, MD;  Location: [facility] ENDOSCOPY;  Service: Endoscopy;  Laterality: N/A;  . TONSILLECTOMY AND ADENOIDECTOMY  ~ 1999       Home Medications    Prior to Admission medications   Medication Sig Start Date End Date Taking? Authorizing Provider  acetaminophen (TYLENOL) 325 MG tablet Take 2 tablets (650 mg total) by mouth every 6 (six) hours as needed for mild pain (or Fever >/= 101). 11/12/15   Velvet Bathe, MD  amoxicillin-clavulanate (AUGMENTIN) 875-125 MG tablet Take 1 tablet by mouth every 12 (twelve) hours. 12/02/15   Caren Griffins, MD  Blood Glucose Monitoring Suppl (ACCU-CHEK AVIVA PLUS) W/DEVICE KIT Check blood sugars 4 times daily (before meals and at bedtime) Patient not taking: Reported on 11/27/2015 12/20/14  Tresa Garter, MD  fenofibrate (TRICOR) 145 MG tablet Take 1 tablet (145 mg total) by mouth daily. 05/22/15   Arnoldo Morale, MD  glucose blood (ACCU-CHEK AVIVA) test strip Use as instructed Patient not taking: Reported on 11/27/2015 12/20/14   Tresa Garter, MD  insulin aspart (NOVOLOG) 100 UNIT/ML injection Blood Glucose 150 - 200, give 3 units; 201 - 250, give 5 unit;251 - 300, give 7 units; 301 - 350, give 10 units 05/15/16   Jegede, Marlena Clipper, MD  Insulin Glargine (LANTUS) 100 UNIT/ML Solostar Pen Inject 45 Units into the skin daily at 10 pm. 06/16/16   Tresa Garter, MD  Lancet Devices [facility]) lancets Use as instructed Patient not taking: Reported on 11/27/2015 12/20/14   Tresa Garter, MD  lisinopril (PRINIVIL,ZESTRIL) 10 MG tablet Take 1 tablet (10 mg total) by mouth daily. 06/25/15   Arnoldo Morale, MD  metFORMIN (GLUCOPHAGE) 500 MG tablet Take 2 tablets (1,000 mg total) by mouth 2 (two) times daily with a meal. 06/25/15   Arnoldo Morale, MD  metoCLOPramide (REGLAN) 5 MG tablet Take 1 tablet (5 mg total) by mouth 3 (three) times daily before meals. Patient taking differently: Take 5 mg by mouth every 8 (eight) hours as needed for nausea.  11/19/15   Annita Brod, MD  omeprazole (PRILOSEC) 40 MG capsule Take 1 capsule (40 mg total) by mouth daily. 10/31/15   Tresa Garter, MD  oxyCODONE-acetaminophen (PERCOCET/ROXICET) 5-325 MG tablet Take 1-2 tablets by mouth every 4 (four) hours as needed for moderate pain or severe pain. 12/02/15   Caren Griffins, MD  promethazine (PHENERGAN) 25 MG tablet Take 1 tablet (25 mg total) by mouth every 8 (eight) hours as needed for nausea or vomiting. 11/12/15   Velvet Bathe, MD    Family History Family History  Problem Relation Age of Onset  . Diabetes Mellitus II Father   . Heart failure Father   . Asthma Mother   . Diabetes Mellitus I Sister     Social History Social History  Substance Use Topics  . Smoking status: Former Smoker    Packs/day: 1.00    Years: 3.00    Types: Cigarettes    Quit date: 12/11/2013  . Smokeless tobacco: Never Used  . Alcohol use Yes     Comment: 11/27/2015 "might have a beer once/month"     Allergies   Patient has no known allergies.   Review of Systems Review of Systems  Constitutional: Negative for fever.  Respiratory: Negative for shortness of breath.   Cardiovascular: Negative for chest pain.  Gastrointestinal:  Negative for diarrhea, nausea and vomiting.  Musculoskeletal: Positive for arthralgias (left-sided).  All other systems reviewed and are negative.    Physical Exam Updated Vital Signs BP (!) 148/86 (BP Location: Left Arm)   Pulse 100   Temp 97.9 F (36.6 C) (Oral)   Resp 18   SpO2 99%   Physical Exam  Constitutional: He is oriented to person, place, and time. He appears well-developed and well-nourished.  HENT:  Head: Normocephalic and atraumatic.  Cardiovascular: Normal rate and regular rhythm.   Pulmonary/Chest: Effort normal. No respiratory distress.  Musculoskeletal:  2+ DP pulses to the left lower cavity. There is tenderness to palpation over the sole of the left foot in the region of the arch and heel. No erythema or rashes to the foot. Full range of motion present in the left hip, knee, ankle. Wiggles all toes. No significant edema  to the foot.  Neurological: He is alert and oriented to person, place, and time.  Skin: Skin is warm and dry.  Psychiatric: He has a normal mood and affect. His behavior is normal.  Nursing note and vitals reviewed.    ED Treatments / Results  DIAGNOSTIC STUDIES: Oxygen Saturation is 99% on RA, normal by my interpretation.    COORDINATION OF CARE: 7:47 PM Discussed treatment plan with pt at bedside and pt agreed to plan.  Labs (all labs ordered are listed, but only abnormal results are displayed) Labs Reviewed - No data to display  EKG  EKG Interpretation None       Radiology No results found.  Procedures Procedures (including critical care time)  Medications Ordered in ED Medications - No data to display   Initial Impression / Assessment and Plan / ED Course  I have reviewed the triage vital signs and the nursing notes.  Pertinent labs & imaging results that were available during my care of the patient were reviewed by me and considered in my medical decision making (see chart for details).     Patient here for  evaluation of 3 weeks of atraumatic left foot pain. He is neurovascularly intact on examination with no evidence of fracture, infectious process. Counseled patient on evaluating his shoes for proper fit. Discussed outpatient follow up and return precautions.   Counseled pt on PCP follow up regarding diabetes.    Final Clinical Impressions(s) / ED Diagnoses   Final diagnoses:  None    New Prescriptions New Prescriptions   No medications on file  I personally performed the services described in this documentation, which was scribed in my presence. The recorded information has been reviewed and is accurate.     Quintella Reichert, MD 02/28/17 2044

## 2017-03-11 ENCOUNTER — Ambulatory Visit: Payer: Self-pay | Attending: Internal Medicine | Admitting: Internal Medicine

## 2017-03-11 ENCOUNTER — Encounter: Payer: Self-pay | Admitting: Internal Medicine

## 2017-03-11 VITALS — BP 124/85 | HR 89 | Temp 98.4°F | Resp 18 | Ht 69.0 in | Wt 252.0 lb

## 2017-03-11 DIAGNOSIS — F329 Major depressive disorder, single episode, unspecified: Secondary | ICD-10-CM | POA: Insufficient documentation

## 2017-03-11 DIAGNOSIS — Z79899 Other long term (current) drug therapy: Secondary | ICD-10-CM | POA: Insufficient documentation

## 2017-03-11 DIAGNOSIS — IMO0002 Reserved for concepts with insufficient information to code with codable children: Secondary | ICD-10-CM

## 2017-03-11 DIAGNOSIS — E669 Obesity, unspecified: Secondary | ICD-10-CM | POA: Insufficient documentation

## 2017-03-11 DIAGNOSIS — E1142 Type 2 diabetes mellitus with diabetic polyneuropathy: Secondary | ICD-10-CM | POA: Insufficient documentation

## 2017-03-11 DIAGNOSIS — Z1389 Encounter for screening for other disorder: Secondary | ICD-10-CM

## 2017-03-11 DIAGNOSIS — Z6837 Body mass index (BMI) 37.0-37.9, adult: Secondary | ICD-10-CM | POA: Insufficient documentation

## 2017-03-11 DIAGNOSIS — I10 Essential (primary) hypertension: Secondary | ICD-10-CM | POA: Insufficient documentation

## 2017-03-11 DIAGNOSIS — E781 Pure hyperglyceridemia: Secondary | ICD-10-CM | POA: Insufficient documentation

## 2017-03-11 DIAGNOSIS — F17200 Nicotine dependence, unspecified, uncomplicated: Secondary | ICD-10-CM | POA: Insufficient documentation

## 2017-03-11 DIAGNOSIS — K219 Gastro-esophageal reflux disease without esophagitis: Secondary | ICD-10-CM | POA: Insufficient documentation

## 2017-03-11 DIAGNOSIS — K859 Acute pancreatitis without necrosis or infection, unspecified: Secondary | ICD-10-CM | POA: Insufficient documentation

## 2017-03-11 DIAGNOSIS — Z1331 Encounter for screening for depression: Secondary | ICD-10-CM | POA: Insufficient documentation

## 2017-03-11 DIAGNOSIS — F17210 Nicotine dependence, cigarettes, uncomplicated: Secondary | ICD-10-CM | POA: Insufficient documentation

## 2017-03-11 DIAGNOSIS — Z23 Encounter for immunization: Secondary | ICD-10-CM

## 2017-03-11 DIAGNOSIS — E1110 Type 2 diabetes mellitus with ketoacidosis without coma: Secondary | ICD-10-CM | POA: Insufficient documentation

## 2017-03-11 DIAGNOSIS — E1165 Type 2 diabetes mellitus with hyperglycemia: Secondary | ICD-10-CM | POA: Insufficient documentation

## 2017-03-11 DIAGNOSIS — B353 Tinea pedis: Secondary | ICD-10-CM | POA: Insufficient documentation

## 2017-03-11 LAB — GLUCOSE, POCT (MANUAL RESULT ENTRY): POC GLUCOSE: 230 mg/dL — AB (ref 70–99)

## 2017-03-11 LAB — POCT GLYCOSYLATED HEMOGLOBIN (HGB A1C): HEMOGLOBIN A1C: 10.7

## 2017-03-11 MED ORDER — GLUCOSE BLOOD VI STRP: ORAL_STRIP | 12 refills | Status: DC

## 2017-03-11 MED ORDER — INSULIN ASPART 100 UNIT/ML ~~LOC~~ SOLN: SUBCUTANEOUS | 6 refills | Status: DC

## 2017-03-11 MED ORDER — LANCETS MISC: 3 refills | Status: DC

## 2017-03-11 MED ORDER — TERBINAFINE HCL 1 % EX CREA: 1.0000 "application " | TOPICAL_CREAM | Freq: Two times a day (BID) | CUTANEOUS | 0 refills | Status: DC

## 2017-03-11 MED ORDER — GABAPENTIN 300 MG PO CAPS
300.0000 mg | ORAL_CAPSULE | Freq: Every day | ORAL | 2 refills | Status: DC
Start: 2017-03-11 — End: 2017-11-22

## 2017-03-11 MED ORDER — TRUE METRIX METER W/DEVICE KIT: PACK | 0 refills | Status: DC

## 2017-03-11 MED ORDER — METFORMIN HCL 500 MG PO TABS: 500.0000 mg | ORAL_TABLET | Freq: Two times a day (BID) | ORAL | 99 refills | Status: DC

## 2017-03-11 MED ORDER — PEN NEEDLES 31G X 5 MM MISC: 100.0000 g | Freq: Two times a day (BID) | 6 refills | Status: DC

## 2017-03-11 MED ORDER — FENOFIBRATE 145 MG PO TABS: 145.0000 mg | ORAL_TABLET | Freq: Every day | ORAL | 99 refills | Status: DC

## 2017-03-11 MED ORDER — INSULIN SYRINGES (DISPOSABLE) U-100 1 ML MISC: 6 refills | Status: DC

## 2017-03-11 MED ORDER — INSULIN GLARGINE 100 UNIT/ML SOLOSTAR PEN: 45.0000 [IU] | PEN_INJECTOR | Freq: Every day | SUBCUTANEOUS | 3 refills | Status: DC

## 2017-03-11 NOTE — Patient Instructions (Signed)
Give appointment with Ms. Brent Costa. Give appointment with Brent Costa in 2 wks  Check your blood sugars at least twice a day before meals and bring in readings on next visit.  Call 1-800-Quit Now for couching and to get free nicotine patches.  Start Gabapentin for the pain in feet.  Follow a Healthy Eating Plan - You can do it! Limit sugary drinks.  Avoid sodas, sweet tea, sport or energy drinks, or fruit drinks.  Drink water, lo-fat milk, or diet drinks. Limit snack foods.   Cut back on candy, cake, cookies, chips, ice cream.  These are a special treat, only in small amounts. Eat plenty of vegetables.  Especially dark green, red, and orange vegetables. Aim for at least 3 servings a day. More is better! Include fruit in your daily diet.  Whole fruit is much healthier than fruit juice! Limit "white" bread, "white" pasta, "white" rice.   Choose "100% whole grain" products, brown or wild rice. Avoid fatty meats. Try "Meatless Monday" and choose eggs or beans one day a week.  When eating meat, choose lean meats like chicken, [place], and fish.  Grill, broil, or bake meats instead of frying, and eat poultry without the skin. Eat less salt.  Avoid frozen pizzas, frozen dinners and salty foods.  Use seasonings other than salt in cooking.  This can help blood pressure and keep you from swelling Beer, wine and liquor have calories.  If you can safely drink alcohol, limit to 1 drink per day for women, 2 drinks for men

## 2017-03-11 NOTE — Progress Notes (Signed)
Patient ID: Brent Costa, male    DOB: 1991-06-15  MRN: [id number]  CC: No chief complaint on file.   Subjective: Brent Costa is a 26 y.o. male who presents for ER follow-up visit and to become reestablished. Last seen 10/2015. His concerns today include:  26 year old male with history of diabetes type 2, hypertension, triglyceridemia, GERD and pancreatitis.  Seen in the ER 02/28/17 with complaints of left foot pain 3 weeks and being out of his medications. -pt c/o pain on sole of LT foot x 1 mth -very tender, has to walk with a limp "I can barely step down on it." -no fever, skin redness -no known injury -endorses numbness intermittently but pain persistentent -takes Tylenol P.M -+numbness in toes RT foot also  2.  DM: -dx 3 yrs ago.  -out meds x 3-4 mths.  Problems affording meds Suppose to be on Lantus 44-46 units daily.  He was not taking the Novolog SS, no meter. Never got meter rxn last yr -not doing well with eating habits. 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  Tob: smokes 3 pks a wk since age 33 yrs.  desires to quit  4.  He was on lisinopril for hypertension. He has been out of this medication for a while.  5.  Complains of feeling depressed. States he has a lot going on at this time but does not care to elaborate. Denies SI/HI Patient Active Problem List   Diagnosis Date Noted  . Other acute pancreatitis   . Pseudocyst of pancreas   . Abnormal CT of the abdomen   . AKI (acute kidney injury) ([place]) 11/27/2015  . DKA (diabetic ketoacidoses) ([place]) 11/27/2015  . Rash 11/27/2015  . Gastroparesis   . Obesity (BMI 30-39.9) 11/15/2015  . Chronic pancreatitis ([place]) 11/13/2015  . 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 Abdominal pain 11/09/2015  . Nausea and vomiting 11/09/2015  . Pancreatitis 11/09/2015  . Acute kidney injury ([place]) 11/09/2015  . Tobacco use 11/09/2015  . Essential hypertension  10/31/2015  . Gastroesophageal reflux disease without esophagitis 10/31/2015  . Rash and nonspecific skin eruption 10/31/2015  . High triglycerides   . Fever, unspecified   . Uncontrolled diabetes mellitus without complication ([place]) 03/55/9741  . Molluscum contagiosum 10/16/2015  . Pancreatitis, acute   . Hypertriglyceridemia 06/25/2015  . Hypertension 05/23/2015  . Diabetes ([place]) 01/11/2014     Current Outpatient Prescriptions on File Prior to Visit  Medication Sig Dispense Refill  . metoCLOPramide (REGLAN) 5 MG tablet Take 1 tablet (5 mg total) by mouth 3 (three) times daily before meals. (Patient taking differently: Take 5 mg by mouth every 8 (eight) hours as needed for nausea. ) 90 tablet 1  . omeprazole (PRILOSEC) 40 MG capsule Take 1 capsule (40 mg total) by mouth daily. 30 capsule 3   No current facility-administered medications on file prior to visit.     No Known Allergies  Social History   Social History  . Marital status: Single    Spouse name: N/A  . Number of children: N/A  . Years of education: N/A   Occupational History  . Not on file.   Social History Main Topics  . Smoking status: Former Smoker    Packs/day: 1.00    Years: 3.00    Types: Cigarettes    Quit date: 12/11/2013  . Smokeless tobacco: Never Used  . Alcohol use Yes  Comment: 11/27/2015 "might have a beer once/month"  . Drug use: Yes    Frequency: 2.0 times per week    Types: Marijuana     Comment: 11/27/2015 "used to use marijuana; used it last  in ~ 2014"  . Sexual activity: Yes    Birth control/ protection: Condom   Other Topics Concern  . Not on file   Social History Narrative  . No narrative on file    Family History  Problem Relation Age of Onset  . Diabetes Mellitus II Father   . Heart failure Father   . Asthma Mother   . Diabetes Mellitus I Sister     Past Surgical History:  Procedure Laterality Date  . ESOPHAGOGASTRODUODENOSCOPY N/A 11/21/2013   Procedure:  ESOPHAGOGASTRODUODENOSCOPY (EGD);  Surgeon: Wonda Horner, MD;  Location: [facility] ENDOSCOPY;  Service: Endoscopy;  Laterality: N/A;  . TONSILLECTOMY AND ADENOIDECTOMY  ~ 1999    ROS: Review of Systems  Constitutional: Negative for fever.  Eyes: Negative for visual disturbance.  Respiratory: Negative for shortness of breath.   Cardiovascular: Negative for chest pain, palpitations and leg swelling.  Endocrine: Negative for polydipsia and polyuria.    PHYSICAL EXAM: BP 124/85 (BP Location: Right Arm, Patient Position: Sitting, Cuff Size: Large)   Pulse 89   Temp 98.4 F (36.9 C) (Oral)   Resp 18   Ht '5\' 9"'  (1.753 m)   Wt 252 lb (114.3 kg)   SpO2 98%   BMI 37.21 kg/m   Wt Readings from Last 3 Encounters:  03/11/17 252 lb (114.3 kg)  11/27/15 226 lb 3.1 oz (102.6 kg)  11/13/15 224 lb 13.9 oz (102 kg)   Physical Exam General appearance - alert, well appearing, and in no distress Mental status - alert, oriented to person, place, and time, normal mood, behavior, speech, dress, motor activity, and thought processes Mouth - mucous membranes moist, pharynx normal without lesions Neck - supple, no significant adenopathy Chest - clear to auscultation, no wheezes, rales or rhonchi, symmetric air entry Heart - normal rate, regular rhythm, normal S1, S2, no murmurs, rubs, clicks or gallops Extremities - peripheral pulses normal, no pedal edema, no clubbing or cyanosis MSK: Feet:  No edema or erythema.  Mild point tenderness mid sole/instep of LT foot Diabetic Foot Exam - Simple   Simple Foot Form Visual Inspection No deformities, no ulcerations, no other skin breakdown bilaterally:  Yes Sensation Testing See comments:  Yes Pulse Check Posterior Tibialis and Dorsalis pulse intact bilaterally:  Yes Comments LEEP exam is abnormal with decrease sensation on the plantar surface of the left foot. Noted hyperpigmentation and peeling of the skin between the toes and around the edges of both feet.      Depression screen PHQ 2/9 03/11/2017  Decreased Interest 2  Down, Depressed, Hopeless 1  PHQ - 2 Score 3  Altered sleeping 1  Tired, decreased energy 2  Change in appetite 2  Feeling bad or failure about yourself  1  Trouble concentrating 2  Moving slowly or fidgety/restless 1  Suicidal thoughts 1  PHQ-9 Score 13   GAD 7 : Generalized Anxiety Score 03/11/2017 10/31/2015  Nervous, Anxious, on Edge 2 1  Control/stop worrying 2 1  Worry too much - different things 3 1  Trouble relaxing 2 1  Restless 2 1  Easily annoyed or irritable 2 1  Afraid - awful might happen 1 1  Total GAD 7 Score 14 7   Results for orders placed or performed in  visit on 03/11/17  Glucose (CBG)  Result Value Ref Range   POC Glucose 230 (A) 70 - 99 mg/dl  HgB A1c  Result Value Ref Range   Hemoglobin A1C 10.7        ASSESSMENT AND PLAN: 1. Uncontrolled type 2 diabetes mellitus with peripheral neuropathy (HCC) Discussed the importance of healthy eating habits, regular aerobic exercise (at least 150 minutes a week as tolerated) and medication compliance to achieve or maintain control of diabetes and prevent further complications. -We will put him back on Lantus 45 units daily and have him take a standing dose of NovoLog with the 2 largest meals of the day.  Added metformin. -Trial of Neurontin at bedtime. 6 feet. -Follow up with the clinical pharmacists in 2 weeks. Patient to return blood sugar readings with him - Glucose (CBG) - HgB A1c - insulin aspart (NOVOLOG) 100 UNIT/ML injection; Take 8 units twice daily with the two larges meals of the day  Dispense: 2 vial; Refill: 6 - Insulin Glargine (LANTUS) 100 UNIT/ML Solostar Pen; Inject 45 Units into the skin daily at 10 pm.  Dispense: 15 mL; Refill: 3 - gabapentin (NEURONTIN) 300 MG capsule; Take 1 capsule (300 mg total) by mouth at bedtime.  Dispense: 90 capsule; Refill: 2 - CBC - Comprehensive metabolic panel - Lipid panel - Blood Glucose Monitoring  Suppl (TRUE METRIX METER) w/Device KIT; Use as directed  Dispense: 1 kit; Refill: 0 - glucose blood (TRUE METRIX BLOOD GLUCOSE TEST) test strip; Use as instructed  Dispense: 100 each; Refill: 12 - Lancets MISC; Use as directed  Dispense: 100 each; Refill: 3 - Insulin Pen Needle (PEN NEEDLES) 31G X 5 MM MISC; 100 g by Does not apply route 2 (two) times daily.  Dispense: 100 each; Refill: 6 - Insulin Syringes, Disposable, U-100 1 ML MISC; Use as directed with insulin  Dispense: 100 each; Refill: 6 - Ambulatory referral to Ophthalmology - Microalbumin / creatinine urine ratio  2. Hypertriglyceridemia - fenofibrate (TRICOR) 145 MG tablet; Take 1 tablet (145 mg total) by mouth daily.  Dispense: 90 tablet; Refill: PRN  3. Tobacco dependence Patient advised to quit smoking. Discussed health risks associated with smoking including lung and other types of cancers, chronic lung diseases and CV risks.. Pt ready to give trail of quitting.  Discussed methods to help quit including quitting cold [place], use of NRT, Chantix and Bupropion. He would like to try the nicotine patches. I have given him information to call 1 800 quit now for coaching and for 3 patches  4. Positive depression screening -Patient declines referral to our LCSW. He does not feel that he needs to be on any medication at this time.  5. Tinea pedis of both feet - terbinafine (LAMISIL AT) 1 % cream; Apply 1 application topically 2 (two) times daily.  Dispense: 30 g; Refill: 0  6. Need for Tdap vaccination - Tdap vaccine greater than or equal to 7yo IM  7. Need for Streptococcus pneumoniae vaccination - Pneumococcal polysaccharide vaccine 23-valent greater than or equal to 2yo subcutaneous/IM  8.  Blood pressure is controlled today so will hold off on restarting lisinopril. However if he does have microalbumin urea we will add this medication. Patient was given the opportunity to ask questions.  Patient verbalized understanding of the  plan and was able to repeat key elements of the plan.   Orders Placed This Encounter  Procedures  . Pneumococcal polysaccharide vaccine 23-valent greater than or equal to 2yo subcutaneous/IM  .  Tdap vaccine greater than or equal to 7yo IM  . CBC  . Comprehensive metabolic panel  . Lipid panel  . Microalbumin / creatinine urine ratio  . Ambulatory referral to Ophthalmology  . Glucose (CBG)  . HgB A1c     Requested Prescriptions   Signed Prescriptions Disp Refills  . fenofibrate (TRICOR) 145 MG tablet 90 tablet PRN    Sig: Take 1 tablet (145 mg total) by mouth daily.  . insulin aspart (NOVOLOG) 100 UNIT/ML injection 2 vial 6    Sig: Take 8 units twice daily with the two larges meals of the day  . Insulin Glargine (LANTUS) 100 UNIT/ML Solostar Pen 15 mL 3    Sig: Inject 45 Units into the skin daily at 10 pm.  . gabapentin (NEURONTIN) 300 MG capsule 90 capsule 2    Sig: Take 1 capsule (300 mg total) by mouth at bedtime.  . terbinafine (LAMISIL AT) 1 % cream 30 g 0    Sig: Apply 1 application topically 2 (two) times daily.  . Blood Glucose Monitoring Suppl (TRUE METRIX METER) w/Device KIT 1 kit 0    Sig: Use as directed  . glucose blood (TRUE METRIX BLOOD GLUCOSE TEST) test strip 100 each 12    Sig: Use as instructed  . Lancets MISC 100 each 3    Sig: Use as directed  . Insulin Pen Needle (PEN NEEDLES) 31G X 5 MM MISC 100 each 6    Sig: 100 g by Does not apply route 2 (two) times daily.  . Insulin Syringes, Disposable, U-100 1 ML MISC 100 each 6    Sig: Use as directed with insulin    Return in about 6 weeks (around 04/22/2017).  Karle Plumber, MD, FACP

## 2017-03-11 NOTE — Progress Notes (Signed)
F/U DM  No taking medication as prescribed  No taking medication x 2-3 month  No checking glucose at home  Taking medication  Pain scale # 6  Rt foot pain  Lt foot numbness  No suicidal thoughts in the past two weeks

## 2017-03-12 ENCOUNTER — Other Ambulatory Visit: Payer: Self-pay | Admitting: Internal Medicine

## 2017-03-12 LAB — COMPREHENSIVE METABOLIC PANEL
ALK PHOS: 71 IU/L (ref 39–117)
ALT: 18 IU/L (ref 0–44)
AST: 15 IU/L (ref 0–40)
Albumin/Globulin Ratio: 2 (ref 1.2–2.2)
Albumin: 4.9 g/dL (ref 3.5–5.5)
BILIRUBIN TOTAL: 0.6 mg/dL (ref 0.0–1.2)
BUN/Creatinine Ratio: 13 (ref 9–20)
BUN: 12 mg/dL (ref 6–20)
CHLORIDE: 100 mmol/L (ref 96–106)
CO2: 20 mmol/L (ref 18–29)
CREATININE: 0.91 mg/dL (ref 0.76–1.27)
Calcium: 10.5 mg/dL — ABNORMAL HIGH (ref 8.7–10.2)
GFR calc non Af Amer: 118 mL/min/{1.73_m2} (ref >59)
GFR, EST AFRICAN AMERICAN: 136 mL/min/{1.73_m2} (ref >59)
GLUCOSE: 247 mg/dL — AB (ref 65–99)
Globulin, Total: 2.4 g/dL (ref 1.5–4.5)
Potassium: 4.6 mmol/L (ref 3.5–5.2)
Sodium: 139 mmol/L (ref 134–144)
TOTAL PROTEIN: 7.3 g/dL (ref 6.0–8.5)

## 2017-03-12 LAB — LIPID PANEL
CHOLESTEROL TOTAL: 237 mg/dL — AB (ref 100–199)
Chol/HDL Ratio: 9.9 ratio — ABNORMAL HIGH (ref 0.0–5.0)
HDL: 24 mg/dL — AB (ref >39)
Triglycerides: 1257 mg/dL (ref 0–149)

## 2017-03-12 LAB — MICROALBUMIN / CREATININE URINE RATIO
Creatinine, Urine: 159.9 mg/dL
Microalb/Creat Ratio: 74 mg/g creat — ABNORMAL HIGH (ref 0.0–30.0)
Microalbumin, Urine: 118.4 ug/mL

## 2017-03-12 LAB — CBC
HEMOGLOBIN: 16 g/dL (ref 13.0–17.7)
Hematocrit: 46.8 % (ref 37.5–51.0)
MCH: 29 pg (ref 26.6–33.0)
MCHC: 34.2 g/dL (ref 31.5–35.7)
MCV: 85 fL (ref 79–97)
PLATELETS: 246 10*3/uL (ref 150–379)
RBC: 5.51 x10E6/uL (ref 4.14–5.80)
RDW: 14.2 % (ref 12.3–15.4)
WBC: 6.6 10*3/uL (ref 3.4–10.8)

## 2017-03-12 MED ORDER — LISINOPRIL 5 MG PO TABS: 5.0000 mg | ORAL_TABLET | Freq: Every day | ORAL | 3 refills | Status: DC

## 2017-03-12 NOTE — Progress Notes (Signed)
Lisinopril added for microalbuminuria

## 2017-03-15 ENCOUNTER — Telehealth: Payer: Self-pay

## 2017-03-15 NOTE — Telephone Encounter (Signed)
Contacted pt to go over lab results spoke with pt mother and made aware of results pt mother is aware and doesn't have any questions or concerns

## 2017-03-17 ENCOUNTER — Ambulatory Visit: Payer: Self-pay | Attending: Internal Medicine

## 2017-03-19 ENCOUNTER — Ambulatory Visit: Payer: Self-pay | Admitting: Internal Medicine

## 2017-03-25 ENCOUNTER — Ambulatory Visit: Payer: Self-pay | Admitting: Pharmacist

## 2017-03-25 NOTE — Progress Notes (Deleted)
    S:     No chief complaint on file.   Patient arrives ***.  Presents for diabetes evaluation, education, and management at the request of Dr. Laural BenesJohnson. Patient was referred on 03/11/17.  Patient was last seen by Primary Care Provider on 03/11/17.   Patient reports Diabetes was diagnosed in 2015   Family/Social History:   Patient {Actions; denies-reports:120008} adherence with medications.  Current diabetes medications include: Lantus 45 units daily, Novolog 8 units BID with largest meals  Patient {Actions; denies-reports:120008} hypoglycemic events.  Patient reported dietary habits: does not watch carb intake  Patient reported exercise habits: walks as a Engineer, materialssecurity officer   Patient {Actions; denies-reports:120008} nocturia.  Patient {Actions; denies-reports:120008} neuropathy. Patient {Actions; denies-reports:120008} visual changes. Patient {Actions; denies-reports:120008} self foot exams.   [email or web address]   O:  Physical Exam   ROS   Lab Results  Component Value Date   HGBA1C 10.7 03/11/2017   There were no vitals filed for this visit.  Home fasting CBG: ***  2 hour post-prandial/random CBG: ***.  10 year ASCVD risk: ***.  A/P: Diabetes longstanding currently ***. Patient {Actions; denies-reports:120008} hypoglycemic events and is able to verbalize appropriate hypoglycemia management plan. Patient {Actions; denies-reports:120008} adherence with medication. Control is suboptimal due to ***.  Next A1C anticipated September 2018.    Written patient instructions provided.  Total time in face to face counseling *** minutes.   Follow up in Pharmacist Clinic Visit ***.   Patient seen with Dutch QuintKaley Whitesell, PharmD Candidate and Louis MatteJay Worthington, PharmD Candidate

## 2017-04-21 ENCOUNTER — Encounter: Payer: Self-pay | Admitting: Internal Medicine

## 2017-04-22 ENCOUNTER — Ambulatory Visit: Payer: Self-pay | Admitting: Internal Medicine

## 2017-07-05 ENCOUNTER — Ambulatory Visit: Payer: Self-pay | Admitting: Internal Medicine

## 2017-10-06 ENCOUNTER — Other Ambulatory Visit: Payer: Self-pay

## 2017-10-06 ENCOUNTER — Emergency Department (HOSPITAL_COMMUNITY)
Admission: EM | Admit: 2017-10-06 | Discharge: 2017-10-06 | Disposition: A | Discharge status: Home / Self Care | Payer: Self-pay | Attending: Emergency Medicine | Admitting: Emergency Medicine

## 2017-10-06 ENCOUNTER — Emergency Department (HOSPITAL_COMMUNITY): Payer: Self-pay

## 2017-10-06 ENCOUNTER — Encounter (HOSPITAL_COMMUNITY): Payer: Self-pay

## 2017-10-06 DIAGNOSIS — Y929 Unspecified place or not applicable: Secondary | ICD-10-CM | POA: Insufficient documentation

## 2017-10-06 DIAGNOSIS — E119 Type 2 diabetes mellitus without complications: Secondary | ICD-10-CM | POA: Insufficient documentation

## 2017-10-06 DIAGNOSIS — Z794 Long term (current) use of insulin: Secondary | ICD-10-CM | POA: Insufficient documentation

## 2017-10-06 DIAGNOSIS — Z87891 Personal history of nicotine dependence: Secondary | ICD-10-CM | POA: Insufficient documentation

## 2017-10-06 DIAGNOSIS — E785 Hyperlipidemia, unspecified: Secondary | ICD-10-CM | POA: Insufficient documentation

## 2017-10-06 DIAGNOSIS — W109XXA Fall (on) (from) unspecified stairs and steps, initial encounter: Secondary | ICD-10-CM | POA: Insufficient documentation

## 2017-10-06 DIAGNOSIS — Y939 Activity, unspecified: Secondary | ICD-10-CM | POA: Insufficient documentation

## 2017-10-06 DIAGNOSIS — Z79899 Other long term (current) drug therapy: Secondary | ICD-10-CM | POA: Insufficient documentation

## 2017-10-06 DIAGNOSIS — Y998 Other external cause status: Secondary | ICD-10-CM | POA: Insufficient documentation

## 2017-10-06 DIAGNOSIS — M25511 Pain in right shoulder: Secondary | ICD-10-CM | POA: Insufficient documentation

## 2017-10-06 DIAGNOSIS — I10 Essential (primary) hypertension: Secondary | ICD-10-CM | POA: Insufficient documentation

## 2017-10-06 MED ORDER — IBUPROFEN 200 MG PO TABS
600.0000 mg | ORAL_TABLET | Freq: Once | ORAL | Status: AC
Start: 2017-10-06 — End: 2017-10-06
  Administered 2017-10-06: 600 mg via ORAL
  Filled 2017-10-06: qty 1

## 2017-10-06 MED ORDER — CYCLOBENZAPRINE HCL 10 MG PO TABS: 10.0000 mg | ORAL_TABLET | Freq: Two times a day (BID) | ORAL | 0 refills | Status: DC | PRN

## 2017-10-06 NOTE — Discharge Instructions (Addendum)
The x-ray of your right shoulder does not show any fracture or abnormality.  Please take ibuprofen and Tylenol as needed for pain. I have also written you a prescription for a muscle relaxer medicine called flexeril. This medicine can make you drowsy so please do not work, drive or drink alcohol while taking it.   Please schedule an appoint with your primary care doctor for recheck of your blood pressure and for follow-up of your shoulder pain if it continues.  Return to the emergency department if you have any new or worsening symptoms.  Please also return if you have a worsening headache with vomiting that will not stop or headache with numbness and weakness.

## 2017-10-06 NOTE — ED Triage Notes (Signed)
Pt states he fell down stairs last night (3rd step from the top and he estimates about 8 stairs) and is now having pain to right shoulder that radiates down right arm into hand. Pt endorses tingling of hand.

## 2017-10-06 NOTE — ED Provider Notes (Signed)
[facility] EMERGENCY DEPARTMENT Provider Note   CSN: [id number] Arrival date & time: 10/06/17  4097     History   Chief Complaint Chief Complaint  Patient presents with  . Fall  . Shoulder Pain    HPI Brent Costa is a 27 y.o. male.  HPI   Brent Costa is a 27 year old male with a history of hypertension, insulin-dependent diabetes, dyslipidemia who presents to the emergency department for evaluation of right shoulder pain after having a mechanical fall earlier today.  Patient states that early this morning he tripped down 7 stairs, falling on the right side.  He denies preceding lightheadedness or dizziness.  He does state that he hit the back of his head with a fall, denies loss of consciousness.  Denies blood thinner use.  Reports a mild posterior headache which is aching and 3/10 in severity.  He denies associated vision changes, nausea/vomiting, numbness, weakness, confusion.  States that right shoulder pain is 5/10 in severity and feels "sore."  The pain radiates to the right side of his neck.  Shoulder pain worsens with any arm movement.  He states that he has a tingling sensation in the right hand, but denies loss of sensation.  He took some Motrin earlier today with mild relief of his symptoms.   Past Medical History:  Diagnosis Date  . Chronic bronchitis ([place])    "get it about q year" (11/20/2013)  . Chronic bronchitis ([place])   . Chronic pancreatitis ([place]) 10/2015   likely present 11/2013 but no lipase at that time.  due to elevated triglycerides and ETOH  . GERD (gastroesophageal reflux disease)   . Hepatic steatosis 11/2013   seen on CT, LFTs normal.  . Hypertension   . Hypertriglyceridemia   . Microalbuminuria   . Molluscum contagiosum 06/2015   upper extremities.   . Obesity   . Type II diabetes mellitus ([place]) dx'd 11/2013   oral agents and insulin    Patient Active Problem List   Diagnosis Date Noted  . Tobacco dependence 03/11/2017  .  Positive depression screening 03/11/2017  . Abnormal CT of the abdomen   . Gastroparesis   . Obesity (BMI 30-39.9) 11/15/2015  . Chronic pancreatitis ([place]) 11/13/2015  . 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 Essential hypertension 10/31/2015  . Gastroesophageal reflux disease without esophagitis 10/31/2015  . Molluscum contagiosum 10/16/2015  . Hypertriglyceridemia 06/25/2015  . Type 2 diabetes mellitus with diabetic neuropathy, unspecified ([place]) 01/11/2014    Past Surgical History:  Procedure Laterality Date  . ESOPHAGOGASTRODUODENOSCOPY N/A 11/21/2013   Procedure: ESOPHAGOGASTRODUODENOSCOPY (EGD);  Surgeon: Wonda Horner, MD;  Location: [facility] ENDOSCOPY;  Service: Endoscopy;  Laterality: N/A;  . TONSILLECTOMY AND ADENOIDECTOMY  ~ 1999       Home Medications    Prior to Admission medications   Medication Sig Start Date End Date Taking? Authorizing Provider  Blood Glucose Monitoring Suppl (TRUE METRIX METER) w/Device KIT Use as directed 03/11/17   Ladell Pier, MD  fenofibrate (TRICOR) 145 MG tablet Take 1 tablet (145 mg total) by mouth daily. 03/11/17   Ladell Pier, MD  gabapentin (NEURONTIN) 300 MG capsule Take 1 capsule (300 mg total) by mouth at bedtime. 03/11/17   Ladell Pier, MD  glucose blood (TRUE METRIX BLOOD GLUCOSE TEST) test strip Use as instructed 03/11/17   Ladell Pier, MD  insulin aspart (NOVOLOG) 100 UNIT/ML injection Take 8 units twice daily with the two larges meals of the day 03/11/17  Ladell Pier, MD  Insulin Glargine (LANTUS) 100 UNIT/ML Solostar Pen Inject 45 Units into the skin daily at 10 pm. 03/11/17   Ladell Pier, MD  Insulin Pen Needle (PEN NEEDLES) 31G X 5 MM MISC 100 g by Does not apply route 2 (two) times daily. 03/11/17   Ladell Pier, MD  Insulin Syringes, Disposable, U-100 1 ML MISC Use as directed with insulin 03/11/17   Ladell Pier, MD  Lancets MISC Use as directed 03/11/17   Ladell Pier, MD  lisinopril  (PRINIVIL,ZESTRIL) 5 MG tablet Take 1 tablet (5 mg total) by mouth daily. 03/12/17   Ladell Pier, MD  metFORMIN (GLUCOPHAGE) 500 MG tablet Take 1 tablet (500 mg total) by mouth 2 (two) times daily with a meal. 03/11/17   Ladell Pier, MD  metoCLOPramide (REGLAN) 5 MG tablet Take 1 tablet (5 mg total) by mouth 3 (three) times daily before meals. Patient taking differently: Take 5 mg by mouth every 8 (eight) hours as needed for nausea.  11/19/15   Annita Brod, MD  omeprazole (PRILOSEC) 40 MG capsule Take 1 capsule (40 mg total) by mouth daily. 10/31/15   Tresa Garter, MD  terbinafine (LAMISIL AT) 1 % cream Apply 1 application topically 2 (two) times daily. 03/11/17   Ladell Pier, MD    Family History Family History  Problem Relation Age of Onset  . Diabetes Mellitus II Father   . Heart failure Father   . Asthma Mother   . Diabetes Mellitus I Sister     Social History Social History   Tobacco Use  . Smoking status: Former Smoker    Packs/day: 1.00    Years: 3.00    Pack years: 3.00    Types: Cigarettes    Last attempt to quit: 12/11/2013    Years since quitting: 3.8  . Smokeless tobacco: Never Used  Substance Use Topics  . Alcohol use: Yes    Comment: 11/27/2015 "might have a beer once/month"  . Drug use: Yes    Frequency: 2.0 times per week    Types: Marijuana    Comment: 11/27/2015 "used to use marijuana; used it last  in ~ 2014"     Allergies   Patient has no known allergies.   Review of Systems Review of Systems  HENT: Negative for trouble swallowing.   Gastrointestinal: Negative for abdominal pain, nausea and vomiting.  Musculoskeletal: Positive for arthralgias. Negative for gait problem.  Neurological: Positive for headaches. Negative for dizziness, speech difficulty, weakness, light-headedness and numbness.     Physical Exam Updated Vital Signs BP (!) 156/88 (BP Location: Right Arm)   Pulse 79   Temp 98.7 F (37.1 C) (Oral)   Resp  (!) 24   Ht '5\' 9"'  (1.753 m)   Wt 117.9 kg (260 lb)   SpO2 100%   BMI 38.40 kg/m   Physical Exam  Constitutional: He is oriented to person, place, and time. He appears well-developed and well-nourished. No distress.  HENT:  Head: Normocephalic and atraumatic.  Nose: Nose normal.  Mouth/Throat: Oropharynx is clear and moist. No oropharyngeal exudate.  No battle sign.  No raccoon eyes.    Eyes: Conjunctivae are normal. Pupils are equal, round, and reactive to light. Right eye exhibits no discharge. Left eye exhibits no discharge. No scleral icterus.  Neck: Normal range of motion. Neck supple.  No midline cervical spine tenderness.  Mild tenderness to palpation of the right paraspinal muscles of the  cervical spine.  Cardiovascular: Normal rate, regular rhythm and intact distal pulses. Exam reveals no friction rub.  No murmur heard. Pulmonary/Chest: Effort normal and breath sounds normal. No stridor. No respiratory distress. He has no wheezes. He has no rales.  Musculoskeletal:       Arms: Right shoulder with tenderness to palpation as depicted in image.  Full active ROM, although painful.  Negative empty can test, negative Neer's, negative lift off. No swelling, erythema or ecchymosis present. No step-off, crepitus, or deformity appreciated. 5/5 muscle strength of UE. 2+ radial pulse.    Neurological: He is alert and oriented to person, place, and time. Coordination normal.  Mental Status:  Alert, oriented, thought content appropriate, able to give a coherent history. Speech fluent without evidence of aphasia. Able to follow 2 step commands without difficulty.  Cranial Nerves:  II:  Peripheral visual fields grossly normal, pupils equal, round, reactive to light III,IV, VI: ptosis not present, extra-ocular motions intact bilaterally  V,VII: smile symmetric, facial light touch sensation equal VIII: hearing grossly normal to voice  X: uvula elevates symmetrically  XI: bilateral shoulder  shrug symmetric and strong XII: midline tongue extension without fassiculations Motor:  Normal tone. 5/5 in upper and lower extremities bilaterally including strong and equal grip strength and dorsiflexion/plantar flexion Sensory: Pinprick and light touch normal in all extremities.  Deep Tendon Reflexes: 2+ and symmetric in the biceps and patella Cerebellar: normal finger-to-nose with bilateral upper extremities Gait: normal gait and balance  Skin: Skin is warm and dry. He is not diaphoretic.  Psychiatric: He has a normal mood and affect. His behavior is normal.  Nursing note and vitals reviewed.    ED Treatments / Results  Labs (all labs ordered are listed, but only abnormal results are displayed) Labs Reviewed - No data to display  EKG  EKG Interpretation None       Radiology Dg Shoulder Right  Result Date: 10/06/2017 CLINICAL DATA:  27 year old male with a history of fall EXAM: RIGHT SHOULDER - 2+ VIEW COMPARISON:  12/30/2010 FINDINGS: No acute bony abnormality. No focal soft tissue swelling. No radiopaque foreign body. Glenohumeral joint appears congruent. IMPRESSION: Negative for acute bony abnormality. Electronically Signed   By: Corrie Mckusick D.O.   On: 10/06/2017 10:08    Procedures Procedures (including critical care time)  Medications Ordered in ED Medications - No data to display   Initial Impression / Assessment and Plan / ED Course  I have reviewed the triage vital signs and the nursing notes.  Pertinent labs & imaging results that were available during my care of the patient were reviewed by me and considered in my medical decision making (see chart for details).    Patient presents following a mechanical fall earlier today.  He states that he hit his head and has mild posterior headache.  Denies blood thinner use.  Denies vomiting, visual disturbance, numbness, weakness.  No neurological deficits on exam.  Do not suspect closed head injury given presentation  and do not think further imaging is necessary at this time.  Patient agrees.  Patient also complaining of right shoulder pain.  X-ray negative for acute fracture or abnormality.  Right arm is neurovascularly intact on exam.  Discussed NSAID use and RICE protocol.  His blood pressure was elevated in the ER today, counseled him to have this rechecked.  Patient counseled on return precautions.  Patient agrees and voiced understanding to the above plan.  Final Clinical Impressions(s) / ED Diagnoses  Final diagnoses:  None    ED Discharge Orders    None       Bernarda Caffey 10/06/17 1318    Blanchie Dessert, MD 10/06/17 2129

## 2017-10-06 NOTE — ED Notes (Addendum)
Patient comfortable in room with no needs voiced at this time.

## 2017-10-12 ENCOUNTER — Other Ambulatory Visit: Payer: Self-pay

## 2017-10-12 ENCOUNTER — Encounter (HOSPITAL_COMMUNITY): Payer: Self-pay | Admitting: Emergency Medicine

## 2017-10-12 ENCOUNTER — Emergency Department (HOSPITAL_COMMUNITY): Payer: Self-pay

## 2017-10-12 DIAGNOSIS — R0789 Other chest pain: Secondary | ICD-10-CM | POA: Insufficient documentation

## 2017-10-12 DIAGNOSIS — Z5321 Procedure and treatment not carried out due to patient leaving prior to being seen by health care provider: Secondary | ICD-10-CM | POA: Insufficient documentation

## 2017-10-12 LAB — BASIC METABOLIC PANEL
Anion gap: 14 (ref 5–15)
BUN: 10 mg/dL (ref 6–20)
CHLORIDE: 98 mmol/L — AB (ref 101–111)
CO2: 22 mmol/L (ref 22–32)
CREATININE: 0.91 mg/dL (ref 0.61–1.24)
Calcium: 9.6 mg/dL (ref 8.9–10.3)
GFR calc Af Amer: >60 mL/min (ref >60)
GFR calc non Af Amer: >60 mL/min (ref >60)
GLUCOSE: 279 mg/dL — AB (ref 65–99)
POTASSIUM: 4.8 mmol/L (ref 3.5–5.1)
Sodium: 134 mmol/L — ABNORMAL LOW (ref 135–145)

## 2017-10-12 LAB — CBC
HEMATOCRIT: 47.4 % (ref 39.0–52.0)
Hemoglobin: 16.7 g/dL (ref 13.0–17.0)
MCH: 29.3 pg (ref 26.0–34.0)
MCHC: 35.2 g/dL (ref 30.0–36.0)
MCV: 83.3 fL (ref 78.0–100.0)
PLATELETS: 246 10*3/uL (ref 150–400)
RBC: 5.69 MIL/uL (ref 4.22–5.81)
RDW: 13 % (ref 11.5–15.5)
WBC: 5 10*3/uL (ref 4.0–10.5)

## 2017-10-12 LAB — I-STAT TROPONIN, ED: Troponin i, poc: 0.01 ng/mL (ref 0.00–0.08)

## 2017-10-12 LAB — CBG MONITORING, ED: Glucose-Capillary: 283 mg/dL — ABNORMAL HIGH (ref 65–99)

## 2017-10-12 LAB — LIPASE, BLOOD: Lipase: 22 U/L (ref 11–51)

## 2017-10-12 NOTE — ED Triage Notes (Signed)
Pt presents to ED for assessment of central and left sided chest pain starting suddenly while sitting on the couch.  Patient states he has had some upset stomach earlier today, but denies any other sick symptoms the past week.  Pt has DM, does not check his cbg at home.  Pt c/o left sided numbness.

## 2017-10-12 NOTE — ED Provider Notes (Cosign Needed)
Patient placed in Quick Look pathway, seen and evaluated for chief complaint of chest pain on left side PTA.  Pertinent H&P findings include No N/V. No diaphoresis. No SOB. No weakness. Notes left sided arm pain. Pain with ROM. No Hx ACS.  Based on initial evaluation, labs are indicated and radiology studies are indicated.  Patient counseled on process, plan, and necessity for staying for completing the evaluation.     Audry PiliMohr, Sherlie Boyum, PA-C 10/12/17 2046

## 2017-10-13 ENCOUNTER — Emergency Department (HOSPITAL_COMMUNITY)
Admission: EM | Admit: 2017-10-13 | Discharge: 2017-10-13 | Disposition: A | Discharge status: Home / Self Care | Payer: Self-pay | Attending: Emergency Medicine | Admitting: Emergency Medicine

## 2017-10-13 NOTE — ED Notes (Signed)
No answer for vitals  

## 2017-11-22 ENCOUNTER — Encounter: Payer: Self-pay | Admitting: Licensed Clinical Social Worker

## 2017-11-22 ENCOUNTER — Ambulatory Visit: Payer: Self-pay | Attending: Internal Medicine | Admitting: Internal Medicine

## 2017-11-22 ENCOUNTER — Encounter: Payer: Self-pay | Admitting: Internal Medicine

## 2017-11-22 VITALS — BP 154/90 | HR 90 | Temp 98.8°F | Resp 16 | Ht 69.0 in | Wt 241.2 lb

## 2017-11-22 DIAGNOSIS — Z794 Long term (current) use of insulin: Secondary | ICD-10-CM | POA: Insufficient documentation

## 2017-11-22 DIAGNOSIS — F17210 Nicotine dependence, cigarettes, uncomplicated: Secondary | ICD-10-CM | POA: Insufficient documentation

## 2017-11-22 DIAGNOSIS — E1142 Type 2 diabetes mellitus with diabetic polyneuropathy: Secondary | ICD-10-CM | POA: Insufficient documentation

## 2017-11-22 DIAGNOSIS — E1143 Type 2 diabetes mellitus with diabetic autonomic (poly)neuropathy: Secondary | ICD-10-CM | POA: Insufficient documentation

## 2017-11-22 DIAGNOSIS — Z6835 Body mass index (BMI) 35.0-35.9, adult: Secondary | ICD-10-CM | POA: Insufficient documentation

## 2017-11-22 DIAGNOSIS — F331 Major depressive disorder, recurrent, moderate: Secondary | ICD-10-CM | POA: Insufficient documentation

## 2017-11-22 DIAGNOSIS — Z9114 Patient's other noncompliance with medication regimen: Secondary | ICD-10-CM | POA: Insufficient documentation

## 2017-11-22 DIAGNOSIS — E1165 Type 2 diabetes mellitus with hyperglycemia: Secondary | ICD-10-CM

## 2017-11-22 DIAGNOSIS — K861 Other chronic pancreatitis: Secondary | ICD-10-CM | POA: Insufficient documentation

## 2017-11-22 DIAGNOSIS — E1129 Type 2 diabetes mellitus with other diabetic kidney complication: Secondary | ICD-10-CM

## 2017-11-22 DIAGNOSIS — E669 Obesity, unspecified: Secondary | ICD-10-CM | POA: Insufficient documentation

## 2017-11-22 DIAGNOSIS — B353 Tinea pedis: Secondary | ICD-10-CM | POA: Insufficient documentation

## 2017-11-22 DIAGNOSIS — I10 Essential (primary) hypertension: Secondary | ICD-10-CM | POA: Insufficient documentation

## 2017-11-22 DIAGNOSIS — K3184 Gastroparesis: Secondary | ICD-10-CM | POA: Insufficient documentation

## 2017-11-22 DIAGNOSIS — F17200 Nicotine dependence, unspecified, uncomplicated: Secondary | ICD-10-CM

## 2017-11-22 DIAGNOSIS — E781 Pure hyperglyceridemia: Secondary | ICD-10-CM | POA: Insufficient documentation

## 2017-11-22 DIAGNOSIS — K219 Gastro-esophageal reflux disease without esophagitis: Secondary | ICD-10-CM | POA: Insufficient documentation

## 2017-11-22 DIAGNOSIS — R809 Proteinuria, unspecified: Secondary | ICD-10-CM | POA: Insufficient documentation

## 2017-11-22 DIAGNOSIS — IMO0002 Reserved for concepts with insufficient information to code with codable children: Secondary | ICD-10-CM

## 2017-11-22 LAB — GLUCOSE, POCT (MANUAL RESULT ENTRY): POC Glucose: 285 mg/dl — AB (ref 70–99)

## 2017-11-22 MED ORDER — FENOFIBRATE 145 MG PO TABS: 145.0000 mg | ORAL_TABLET | Freq: Every day | ORAL | 99 refills | Status: DC

## 2017-11-22 MED ORDER — INSULIN GLARGINE 100 UNIT/ML SOLOSTAR PEN: 15.0000 [IU] | PEN_INJECTOR | Freq: Every day | SUBCUTANEOUS | 3 refills | Status: DC

## 2017-11-22 MED ORDER — GLUCOSE BLOOD VI STRP: ORAL_STRIP | 12 refills | Status: DC

## 2017-11-22 MED ORDER — BUPROPION HCL ER (SMOKING DET) 150 MG PO TB12: 150.0000 mg | ORAL_TABLET | Freq: Two times a day (BID) | ORAL | 5 refills | Status: DC

## 2017-11-22 MED ORDER — LANCETS MISC: 3 refills | Status: DC

## 2017-11-22 MED ORDER — TERBINAFINE HCL 1 % EX CREA: TOPICAL_CREAM | CUTANEOUS | 1 refills | Status: DC

## 2017-11-22 MED ORDER — METFORMIN HCL 500 MG PO TABS: 500.0000 mg | ORAL_TABLET | Freq: Two times a day (BID) | ORAL | 99 refills | Status: DC

## 2017-11-22 MED ORDER — LISINOPRIL 5 MG PO TABS: 2.5000 mg | ORAL_TABLET | Freq: Every day | ORAL | 3 refills | Status: DC

## 2017-11-22 MED ORDER — TRUE METRIX METER W/DEVICE KIT: PACK | 0 refills | Status: DC

## 2017-11-22 MED FILL — ?METFORMIN HCL 500MG TABLET: 500 | 30 days supply | Qty: 60 | Fill #0

## 2017-11-22 MED FILL — FENOFIBRATE 145 MG TABLET: 145 | 30 days supply | Qty: 30 | Fill #0

## 2017-11-22 MED FILL — TRUE METRIX BLOOD GLUCOSE M: W/DEVICE | 1 days supply | Qty: 1 | Fill #0

## 2017-11-22 MED FILL — BUPROPION SR 150 MG TABLET: 150 | 30 days supply | Qty: 60 | Fill #0

## 2017-11-22 MED FILL — TRUE METRIX TEST STRIP: 30 days supply | Qty: 100 | Fill #0

## 2017-11-22 MED FILL — LISINOPRIL 5 MG TABLET: 5 | 30 days supply | Qty: 15 | Fill #0

## 2017-11-22 MED FILL — !LANTUS SOLOSTAR 100UNITS/M: 100 | 20 days supply | Qty: 3 | Fill #0

## 2017-11-22 MED FILL — TRUEplus LANCETS 28G MISC: 30 days supply | Qty: 100 | Fill #0

## 2017-11-22 NOTE — Patient Instructions (Signed)
Please check your blood sugars twice a day before meals.  Goal is to be 90-130 before meals.   Try to cut back on eating fried foods and white carbs.    Follow a Healthy Eating Plan - You can do it! Limit sugary drinks.  Avoid sodas, sweet tea, sport or energy drinks, or fruit drinks.  Drink water, lo-fat milk, or diet drinks. Limit snack foods.   Cut back on candy, cake, cookies, chips, ice cream.  These are a special treat, only in small amounts. Eat plenty of vegetables.  Especially dark green, red, and orange vegetables. Aim for at least 3 servings a day. More is better! Include fruit in your daily diet.  Whole fruit is much healthier than fruit juice! Limit "white" bread, "white" pasta, "white" rice.   Choose "100% whole grain" products, brown or wild rice. Avoid fatty meats. Try "Meatless Monday" and choose eggs or beans one day a week.  When eating meat, choose lean meats like chicken, [place], and fish.  Grill, broil, or bake meats instead of frying, and eat poultry without the skin. Eat less salt.  Avoid frozen pizzas, frozen dinners and salty foods.  Use seasonings other than salt in cooking.  This can help blood pressure and keep you from swelling Beer, wine and liquor have calories.  If you can safely drink alcohol, limit to 1 drink per day for women, 2 drinks for men

## 2017-11-22 NOTE — Progress Notes (Signed)
Pt states he is not taking any of his medications

## 2017-11-22 NOTE — Progress Notes (Signed)
Patient ID: Brent Costa, male    DOB: 1991-03-28  MRN: [id number]  CC: Diabetes   Subjective: Brent Costa is a 27 y.o. male who presents for chronic ds management.   Last seen 03/2017.  Male, Brent Costa, is with him His concerns today include:  27 year old male with history of diabetes type 2, hypertension, tob dep, triglyceridemia, GERD and pancreatitis.  1.  DM: not taking Lantus or Novolog.  Has not taken them in over 6 months -reports he he was unable to afford.  Recently approved for Medicaid Eating habits:   Admits that he is not doing well with eating habits.  Eating a lot of rice, fried foods.  Drinks regular sodas.   2.  Tob: smokes 1/2 to 1 pk a day. Wants to quit. Never quit before.  Smoked since age 38  3.  Pos dep screen:  "I know I'm depress."  Reports that he has a lot going on in his life but does not care to elaborate.  He admits to suicidal thoughts at times but not currently.  No active plans to carry out suicide.  He was on Wellbutrin at age 59 but stopped taking it. Worked well.  Denies street drugs Drinks about 1-2 x q 2 mths.  1 beer Patient Active Problem List   Diagnosis Date Noted  . Tobacco dependence 03/11/2017  . Positive depression screening 03/11/2017  . Abnormal CT of the abdomen   . Gastroparesis   . Obesity (BMI 30-39.9) 11/15/2015  . Chronic pancreatitis ([place]) 11/13/2015  . 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 Essential hypertension 10/31/2015  . Gastroesophageal reflux disease without esophagitis 10/31/2015  . Molluscum contagiosum 10/16/2015  . Hypertriglyceridemia 06/25/2015  . Type 2 diabetes mellitus with diabetic neuropathy, unspecified ([place]) 01/11/2014     Current Outpatient Medications on File Prior to Visit  Medication Sig Dispense Refill  . Insulin Pen Needle (PEN NEEDLES) 31G X 5 MM MISC 100 g by Does not apply route 2 (two) times daily. 100 each 6  . Insulin Syringes, Disposable, U-100 1 ML MISC Use as directed with  insulin 100 each 6   No current facility-administered medications on file prior to visit.     No Known Allergies  Social History   Socioeconomic History  . Marital status: Single    Spouse name: Not on file  . Number of children: Not on file  . Years of education: Not on file  . Highest education level: Not on file  Social Needs  . Financial resource strain: Not on file  . Food insecurity - worry: Not on file  . Food insecurity - inability: Not on file  . Transportation needs - medical: Not on file  . Transportation needs - non-medical: Not on file  Occupational History  . Not on file  Tobacco Use  . Smoking status: Former Smoker    Packs/day: 1.00    Years: 3.00    Pack years: 3.00    Types: Cigarettes    Last attempt to quit: 12/11/2013    Years since quitting: 3.9  . Smokeless tobacco: Never Used  Substance and Sexual Activity  . Alcohol use: Yes    Comment: 11/27/2015 "might have a beer once/month"  . Drug use: Yes    Frequency: 2.0 times per week    Types: Marijuana    Comment: 11/27/2015 "used to use marijuana; used it last  in ~ 2014"  . Sexual activity: Yes    Birth control/protection: Condom  Other  Topics Concern  . Not on file  Social History Narrative  . Not on file    Family History  Problem Relation Age of Onset  . Diabetes Mellitus II Father   . Heart failure Father   . Asthma Mother   . Diabetes Mellitus I Sister     Past Surgical History:  Procedure Laterality Date  . ESOPHAGOGASTRODUODENOSCOPY N/A 11/21/2013   Procedure: ESOPHAGOGASTRODUODENOSCOPY (EGD);  Surgeon: Wonda Horner, MD;  Location[facility] ENDOSCOPY;  Service: Endoscopy;  Laterality: N/A;  . TONSILLECTOMY AND ADENOIDECTOMY  ~ 1999    ROS: Review of Systems Negative except as stated above. PHYSICAL EXAM: BP (!) 154/90   Pulse 90   Temp 98.8 F (37.1 C) (Oral)   Resp 16   Ht '5\' 9"'  (1.753 m)   Wt 241 lb 3.2 oz (109.4 kg)   SpO2 98%   BMI 35.62 kg/m    LT 130/84, RT  134/84 Physical Exam General appearance - alert, well appearing, and in no distress Mental status - alert, oriented to person, place, and time, normal mood, behavior, speech, dress, motor activity, and thought processes Neck - supple, no significant adenopathy Chest - clear to auscultation, no wheezes, rales or rhonchi, symmetric air entry Heart - normal rate, regular rhythm, normal S1, S2, no murmurs, rubs, clicks or gallops Extremities - peripheral pulses normal, no pedal edema, no clubbing or cyanosis Diabetic Foot Exam - Simple   Simple Foot Form Visual Inspection See comments:  Yes Sensation Testing Intact to touch and monofilament testing bilaterally:  Yes Pulse Check Posterior Tibialis and Dorsalis pulse intact bilaterally:  Yes Comments Hyperpigmentation and peeling b/w toes RT foot     BS 285  Depression screen [facility] 2/9 11/22/2017 03/11/2017 10/31/2015  Decreased Interest '1 2 1  ' Down, Depressed, Hopeless '2 1 1  ' PHQ - 2 Score '3 3 2  ' Altered sleeping 0 1 1  Tired, decreased energy '1 2 1  ' Change in appetite '1 2 1  ' Feeling bad or failure about yourself  '3 1 1  ' Trouble concentrating '1 2 1  ' Moving slowly or fidgety/restless '1 1 1  ' Suicidal thoughts '2 1 1  ' PHQ-9 Score '12 13 9    ' ASSESSMENT AND PLAN: 1. Uncontrolled type 2 diabetes mellitus with peripheral neuropathy ([place]) -Patient noncompliant partially due to limited finances.  He now has Medicaid and is agreeable to being restarted on Lantus and metformin.  I went over blood sugar goals before meals being 90-130.  Went over symptoms of hypoglycemia and how to treat. Encourage him to check blood sugars at least twice a day before meals and bring in readings on next visit in 1 month. - POCT glucose (manual entry) - Hemoglobin A1c - Insulin Glargine (LANTUS) 100 UNIT/ML Solostar Pen; Inject 15 Units into the skin daily at 10 pm.  Dispense: 15 mL; Refill: 3 - metFORMIN (GLUCOPHAGE) 500 MG tablet; Take 1 tablet (500 mg total) by  mouth 2 (two) times daily with a meal.  Dispense: 180 tablet; Refill: PRN - glucose blood (TRUE METRIX BLOOD GLUCOSE TEST) test strip; Use as instructed  Dispense: 100 each; Refill: 12 - Blood Glucose Monitoring Suppl (TRUE METRIX METER) w/Device KIT; Use as directed  Dispense: 1 kit; Refill: 0 - Lancets MISC; Use as directed  Dispense: 100 each; Refill: 3 - Comprehensive metabolic panel  2. Hypertriglyceridemia - fenofibrate (TRICOR) 145 MG tablet; Take 1 tablet (145 mg total) by mouth daily.  Dispense: 90 tablet; Refill: PRN  3.  Tobacco dependence Patient advised to quit smoking. Discussed health risks associated with smoking including lung and other types of cancers, chronic lung diseases and CV risks.. Pt ready to give trail of quitting.  Discussed methods to help quit including quitting cold [place], use of NRT, Chantix and Bupropion.  Patient wanting to try bupropion. Less than 5 minutes spent on counseling.    - buPROPion (ZYBAN) 150 MG 12 hr tablet; Take 1 tablet (150 mg total) by mouth 2 (two) times daily.  Dispense: 60 tablet; Refill: 5  4. Moderate episode of recurrent major depressive disorder [facility]) Patient to be seen by LCSW today and being referred to psychiatry. He is agreeable to trying bupropion. - Ambulatory referral to Psychiatry - buPROPion (ZYBAN) 150 MG 12 hr tablet; Take 1 tablet (150 mg total) by mouth 2 (two) times daily.  Dispense: 60 tablet; Refill: 5  5. Tinea pedis of right foot - terbinafine (LAMISIL AT) 1 % cream; Apply to affected area BID  Dispense: 30 g; Refill: 1  6. Microalbuminuria due to type 2 diabetes mellitus ([place]) -Microalbumin on last 2 spot albumin checks.  Place him on low-dose lisinopril - lisinopril (PRINIVIL,ZESTRIL) 5 MG tablet; Take 0.5 tablets (2.5 mg total) by mouth daily.  Dispense: 45 tablet; Refill: 3  Patient was given the opportunity to ask questions.  Patient verbalized understanding of the plan and was able to repeat key elements  of the plan.   Orders Placed This Encounter  Procedures  . Hemoglobin A1c  . Comprehensive metabolic panel  . Ambulatory referral to Psychiatry  . POCT glucose (manual entry)     Requested Prescriptions   Signed Prescriptions Disp Refills  . Insulin Glargine (LANTUS) 100 UNIT/ML Solostar Pen 15 mL 3    Sig: Inject 15 Units into the skin daily at 10 pm.  . metFORMIN (GLUCOPHAGE) 500 MG tablet 180 tablet PRN    Sig: Take 1 tablet (500 mg total) by mouth 2 (two) times daily with a meal.  . glucose blood (TRUE METRIX BLOOD GLUCOSE TEST) test strip 100 each 12    Sig: Use as instructed  . Blood Glucose Monitoring Suppl (TRUE METRIX METER) w/Device KIT 1 kit 0    Sig: Use as directed  . Lancets MISC 100 each 3    Sig: Use as directed  . fenofibrate (TRICOR) 145 MG tablet 90 tablet PRN    Sig: Take 1 tablet (145 mg total) by mouth daily.  Marland Kitchen lisinopril (PRINIVIL,ZESTRIL) 5 MG tablet 45 tablet 3    Sig: Take 0.5 tablets (2.5 mg total) by mouth daily.  Marland Kitchen terbinafine (LAMISIL AT) 1 % cream 30 g 1    Sig: Apply to affected area BID  . buPROPion (ZYBAN) 150 MG 12 hr tablet 60 tablet 5    Sig: Take 1 tablet (150 mg total) by mouth 2 (two) times daily.    Return in about 1 month (around 12/20/2017).  Karle Plumber, MD, FACP

## 2017-11-23 ENCOUNTER — Encounter: Payer: Self-pay | Admitting: Internal Medicine

## 2017-11-23 NOTE — Addendum Note (Signed)
Addended by: Paschal DoppWHITE, Bergen Magner J on: 11/23/2017 11:27 AM   Modules accepted: Orders

## 2017-11-23 NOTE — BH Specialist Note (Signed)
Integrated Behavioral Health Initial Visit  MRN: [id number] Name: Brent LahShrone A Tabron  Number of Integrated Behavioral Health Clinician visits:: 1/6 Session Start time: 12:30 PM  Session End time: 12:50 PM Total time: 20 minutes  Type of Service: Integrated Behavioral Health- Individual/Family Interpretor:No. Interpretor Name and Language: N/A   Warm Hand Off Completed.       SUBJECTIVE: Brent Costa is a 27 y.o. male accompanied by Golden HurterFriend, [place] Patient was referred by Dr. Laural BenesJohnson for depression and anxiety. Patient reports the following symptoms/concerns: feelings of sadness and worry, little interest in doing things, low energy, feeling bad about self, decreased concentration, irritability, crying episodes, and hx of suicidal ideations Duration of problem: Ongoing; Severity of problem: moderate  OBJECTIVE: Mood: Dysphoric and Affect: Depressed and Tearful Risk of harm to self or others: Suicidal ideation No plan to harm self or others Pt reports hx of suicidal ideations with no plan or intent to harm self or others. He denies current SI. Reports no weapons in home  LIFE CONTEXT: Family and Social: Pt reports having limited support. He has relationship with a younger sister and friend, [place] who accompanied pt to visit School/Work: Pt is employed full time Self-Care: Pt smokes cigarettes (1-1.5ppd) however reports interest in quitting Life Changes: Pt has ongoing medical conditions and reports life stressors negatively impacting health  GOALS ADDRESSED: Patient will: 1. Reduce symptoms of: agitation, anxiety, depression and stress 2. Increase knowledge and/or ability of: coping skills and healthy habits  3. Demonstrate ability to: Increase healthy adjustment to current life circumstances, Increase adequate support systems for patient/family, Increase motivation to adhere to plan of care and Improve medication compliance  INTERVENTIONS: Interventions utilized:  Mining engineerBehavioral Activation, Supportive Counseling, Psychoeducation and/or Health Education and Link to [facility] Resources  Standardized Assessments completed: GAD-7 and PHQ 2&9 with C-SSRS  ASSESSMENT: Patient currently experiencing depression and anxiety triggered by difficulty managing ongoing medical concerns and psychosocial stressors. He reports feelings of sadness and worry, little interest in doing things, low energy, feeling bad about self, decreased concentration, irritability, crying episodes, and hx of suicidal ideations. Denies having weapons in the home or current SI/HI/AVH. LCSWA inquired about protective factors, discussed importance of establishing safety plan, and provided crisis intervention resources.    Patient may benefit from psychoeducation, psychotherapy, and medication management. LCSWA educated pt on correlation between one's mental and physical health, in addition, to how stress can negatively impact health. Therapeutic interventions were discussed and supportive resources provided. Pt is agreeable to participating in medication management through PCP.   PLAN: 1. Follow up with behavioral health clinician on : Pt was encouraged to contact LCSWA if symptoms worsen or fail to improve to [facility] 2. Behavioral recommendations: LCSWA recommends that pt apply healthy coping skills discussed, comply with medication management, and utilize provided resources. Pt is encouraged to schedule follow up appointment with LCSWA 3. Referral(s): Integrated Art gallery managerBehavioral Health Services (In Clinic), Community Mental Health Services (LME/Outside Clinic) and Community Resources:  Food 4. "From scale of 1-10, how likely are you to follow plan?": 8/10  Bridgett LarssonJasmine D Giles Currie, LCSW 11/23/17 10:55 AM

## 2017-11-24 ENCOUNTER — Telehealth: Payer: Self-pay

## 2017-11-24 LAB — COMPREHENSIVE METABOLIC PANEL
A/G RATIO: 1.9 (ref 1.2–2.2)
ALBUMIN: 4.9 g/dL (ref 3.5–5.5)
ALT: 18 IU/L (ref 0–44)
AST: 14 IU/L (ref 0–40)
Alkaline Phosphatase: 71 IU/L (ref 39–117)
BUN / CREAT RATIO: 10 (ref 9–20)
BUN: 11 mg/dL (ref 6–20)
Bilirubin Total: 0.9 mg/dL (ref 0.0–1.2)
CALCIUM: 10 mg/dL (ref 8.7–10.2)
CO2: 20 mmol/L (ref 20–29)
CREATININE: 1.14 mg/dL (ref 0.76–1.27)
Chloride: 99 mmol/L (ref 96–106)
GFR, EST AFRICAN AMERICAN: 103 mL/min/{1.73_m2} (ref >59)
GFR, EST NON AFRICAN AMERICAN: 89 mL/min/{1.73_m2} (ref >59)
GLOBULIN, TOTAL: 2.6 g/dL (ref 1.5–4.5)
Glucose: 376 mg/dL — ABNORMAL HIGH (ref 65–99)
POTASSIUM: 5 mmol/L (ref 3.5–5.2)
SODIUM: 139 mmol/L (ref 134–144)
TOTAL PROTEIN: 7.5 g/dL (ref 6.0–8.5)

## 2017-11-24 LAB — HEMOGLOBIN A1C
ESTIMATED AVERAGE GLUCOSE: 249 mg/dL
Hgb A1c MFr Bld: 10.3 % — ABNORMAL HIGH (ref 4.8–5.6)

## 2017-11-24 NOTE — Telephone Encounter (Signed)
Contacted pt to go over lab results pt didn't answer left a detailed vm informing pt of results and if he has any questions or concerns to give me a call  If pt calls back please give results: A1C 10.3 with goal being less than 7. Please take Metformin and Lantus as prescribed.

## 2017-12-20 ENCOUNTER — Encounter: Payer: Self-pay | Admitting: Internal Medicine

## 2017-12-20 ENCOUNTER — Ambulatory Visit: Payer: Self-pay | Attending: Internal Medicine | Admitting: Internal Medicine

## 2017-12-20 VITALS — BP 157/81 | HR 83 | Temp 98.9°F | Resp 16 | Wt 243.6 lb

## 2017-12-20 DIAGNOSIS — F331 Major depressive disorder, recurrent, moderate: Secondary | ICD-10-CM

## 2017-12-20 DIAGNOSIS — Z79899 Other long term (current) drug therapy: Secondary | ICD-10-CM | POA: Insufficient documentation

## 2017-12-20 DIAGNOSIS — E1165 Type 2 diabetes mellitus with hyperglycemia: Secondary | ICD-10-CM | POA: Insufficient documentation

## 2017-12-20 DIAGNOSIS — E1143 Type 2 diabetes mellitus with diabetic autonomic (poly)neuropathy: Secondary | ICD-10-CM | POA: Insufficient documentation

## 2017-12-20 DIAGNOSIS — F17210 Nicotine dependence, cigarettes, uncomplicated: Secondary | ICD-10-CM | POA: Insufficient documentation

## 2017-12-20 DIAGNOSIS — E781 Pure hyperglyceridemia: Secondary | ICD-10-CM | POA: Insufficient documentation

## 2017-12-20 DIAGNOSIS — K219 Gastro-esophageal reflux disease without esophagitis: Secondary | ICD-10-CM

## 2017-12-20 DIAGNOSIS — Z8249 Family history of ischemic heart disease and other diseases of the circulatory system: Secondary | ICD-10-CM | POA: Insufficient documentation

## 2017-12-20 DIAGNOSIS — R809 Proteinuria, unspecified: Secondary | ICD-10-CM

## 2017-12-20 DIAGNOSIS — K859 Acute pancreatitis without necrosis or infection, unspecified: Secondary | ICD-10-CM | POA: Insufficient documentation

## 2017-12-20 DIAGNOSIS — IMO0002 Reserved for concepts with insufficient information to code with codable children: Secondary | ICD-10-CM

## 2017-12-20 DIAGNOSIS — F17200 Nicotine dependence, unspecified, uncomplicated: Secondary | ICD-10-CM

## 2017-12-20 DIAGNOSIS — I10 Essential (primary) hypertension: Secondary | ICD-10-CM

## 2017-12-20 DIAGNOSIS — Z794 Long term (current) use of insulin: Secondary | ICD-10-CM | POA: Insufficient documentation

## 2017-12-20 DIAGNOSIS — E1129 Type 2 diabetes mellitus with other diabetic kidney complication: Secondary | ICD-10-CM

## 2017-12-20 DIAGNOSIS — E1142 Type 2 diabetes mellitus with diabetic polyneuropathy: Secondary | ICD-10-CM | POA: Insufficient documentation

## 2017-12-20 DIAGNOSIS — Z825 Family history of asthma and other chronic lower respiratory diseases: Secondary | ICD-10-CM | POA: Insufficient documentation

## 2017-12-20 DIAGNOSIS — Z833 Family history of diabetes mellitus: Secondary | ICD-10-CM | POA: Insufficient documentation

## 2017-12-20 DIAGNOSIS — Z7984 Long term (current) use of oral hypoglycemic drugs: Secondary | ICD-10-CM | POA: Insufficient documentation

## 2017-12-20 DIAGNOSIS — E669 Obesity, unspecified: Secondary | ICD-10-CM | POA: Insufficient documentation

## 2017-12-20 LAB — POCT URINALYSIS DIPSTICK
Bilirubin, UA: NEGATIVE
Blood, UA: NEGATIVE
GLUCOSE UA: 500
Leukocytes, UA: NEGATIVE
NITRITE UA: NEGATIVE
PROTEIN UA: NEGATIVE
Urobilinogen, UA: 1 E.U./dL
pH, UA: 6 (ref 5.0–8.0)

## 2017-12-20 LAB — GLUCOSE, POCT (MANUAL RESULT ENTRY)
POC GLUCOSE: 433 mg/dL — AB (ref 70–99)
POC Glucose: 307 mg/dl — AB (ref 70–99)

## 2017-12-20 MED ORDER — LISINOPRIL 5 MG PO TABS: 5.0000 mg | ORAL_TABLET | Freq: Every day | ORAL | 3 refills | Status: DC

## 2017-12-20 MED ORDER — SODIUM CHLORIDE 0.9 % IV BOLUS (SEPSIS): 1000.0000 mL | Freq: Once | INTRAVENOUS | Status: DC

## 2017-12-20 MED ORDER — INSULIN ASPART 100 UNIT/ML ~~LOC~~ SOLN
20.0000 [IU] | Freq: Once | SUBCUTANEOUS | Status: AC
  Administered 2017-12-20: 20 [IU] via SUBCUTANEOUS

## 2017-12-20 MED ORDER — INSULIN ASPART 100 UNIT/ML FLEXPEN: 6.0000 [IU] | PEN_INJECTOR | Freq: Three times a day (TID) | SUBCUTANEOUS | 11 refills | Status: DC

## 2017-12-20 MED ORDER — RANITIDINE HCL 150 MG PO TABS: 150.0000 mg | ORAL_TABLET | Freq: Two times a day (BID) | ORAL | 3 refills | Status: DC

## 2017-12-20 MED ORDER — INSULIN GLARGINE 100 UNIT/ML SOLOSTAR PEN: 20.0000 [IU] | PEN_INJECTOR | Freq: Every day | SUBCUTANEOUS | 6 refills | Status: AC

## 2017-12-20 MED ORDER — PEN NEEDLES 31G X 5 MM MISC: 100.0000 g | Freq: Two times a day (BID) | 6 refills | Status: DC

## 2017-12-20 NOTE — Patient Instructions (Signed)
Increase Lantus to 20 units daily. Star novolog 6 unis with meals.  Increase Lisinopril to 5 mg daily.

## 2017-12-20 NOTE — Progress Notes (Signed)
Patient ID: Brent Costa, male    DOB: 08-30-1991  MRN: [id number]  CC: Diabetes   Subjective: Brent Costa is a 27 y.o. male who presents for 1 mth His concerns today include:  27 year old male with history of diabetes type 2,hypertension, tob dep, depression,triglyceridemia, GERD and pancreatitis.   1.  Depression better on Wellbutrin -he was also able to cut back on smoking from 1 pk/day to 2 pks/wk.  Still aiming to quit.  2.  DM:  Started on Lantus and Metformin last visit.  Reports compliance -checking BS once a day.  Range in 300-400s. Endorses frequent thirst and urination.  No blurred vision.  He has tried to improve on eating habits  3.  C/o intermittent reflux with certain foods.  Request med for it Patient Active Problem List   Diagnosis Date Noted  . Moderate episode of recurrent major depressive disorder ([place]) 11/22/2017  . Microalbuminuria due to type 2 diabetes mellitus ([place]) 11/22/2017  . Tobacco dependence 03/11/2017  . Positive depression screening 03/11/2017  . Abnormal CT of the abdomen   . Gastroparesis   . Obesity (BMI 30-39.9) 11/15/2015  . Chronic pancreatitis ([place]) 11/13/2015  . 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 Essential hypertension 10/31/2015  . Gastroesophageal reflux disease without esophagitis 10/31/2015  . Molluscum contagiosum 10/16/2015  . Hypertriglyceridemia 06/25/2015  . Type 2 diabetes mellitus with diabetic neuropathy, unspecified ([place]) 01/11/2014     Current Outpatient Medications on File Prior to Visit  Medication Sig Dispense Refill  . Blood Glucose Monitoring Suppl (TRUE METRIX METER) w/Device KIT Use as directed 1 kit 0  . buPROPion (ZYBAN) 150 MG 12 hr tablet Take 1 tablet (150 mg total) by mouth 2 (two) times daily. 60 tablet 5  . fenofibrate (TRICOR) 145 MG tablet Take 1 tablet (145 mg total) by mouth daily. 90 tablet PRN  . glucose blood (TRUE METRIX BLOOD GLUCOSE TEST) test strip Use as instructed 100 each 12    . Insulin Syringes, Disposable, U-100 1 ML MISC Use as directed with insulin 100 each 6  . Lancets MISC Use as directed 100 each 3  . metFORMIN (GLUCOPHAGE) 500 MG tablet Take 1 tablet (500 mg total) by mouth 2 (two) times daily with a meal. 180 tablet PRN  . terbinafine (LAMISIL AT) 1 % cream Apply to affected area BID 30 g 1   No current facility-administered medications on file prior to visit.     No Known Allergies  Social History   Socioeconomic History  . Marital status: Single    Spouse name: Not on file  . Number of children: Not on file  . Years of education: Not on file  . Highest education level: Not on file  Social Needs  . Financial resource strain: Not on file  . Food insecurity - worry: Not on file  . Food insecurity - inability: Not on file  . Transportation needs - medical: Not on file  . Transportation needs - non-medical: Not on file  Occupational History  . Not on file  Tobacco Use  . Smoking status: Former Smoker    Packs/day: 1.00    Years: 3.00    Pack years: 3.00    Types: Cigarettes    Last attempt to quit: 12/11/2013    Years since quitting: 4.0  . Smokeless tobacco: Never Used  Substance and Sexual Activity  . Alcohol use: Yes    Comment: 11/27/2015 "might have a beer once/month"  . Drug use: Yes  Frequency: 2.0 times per week    Types: Marijuana    Comment: 11/27/2015 "used to use marijuana; used it last  in ~ 2014"  . Sexual activity: Yes    Birth control/protection: Condom  Other Topics Concern  . Not on file  Social History Narrative  . Not on file    Family History  Problem Relation Age of Onset  . Diabetes Mellitus II Father   . Heart failure Father   . Asthma Mother   . Diabetes Mellitus I Sister     Past Surgical History:  Procedure Laterality Date  . ESOPHAGOGASTRODUODENOSCOPY N/A 11/21/2013   Procedure: ESOPHAGOGASTRODUODENOSCOPY (EGD);  Surgeon: Wonda Horner, MD;  Location: [facility] ENDOSCOPY;  Service: Endoscopy;   Laterality: N/A;  . TONSILLECTOMY AND ADENOIDECTOMY  ~ 1999    ROS: Review of Systems As above PHYSICAL EXAM: BP (!) 157/81   Pulse 83   Temp 98.9 F (37.2 C) (Oral)   Resp 16   Wt 243 lb 9.6 oz (110.5 kg)   SpO2 98%   BMI 35.97 kg/m   Repeat BP 150/80 Physical Exam General appearance - alert, well appearing, and in no distress Mental status - alert, oriented to person, place, and time, normal mood, behavior, speech, dress, motor activity, and thought processes Mouth - mucous membranes moist, pharynx normal without lesions Chest - clear to auscultation, no wheezes, rales or rhonchi, symmetric air entry Heart - normal rate, regular rhythm, normal S1, S2, no murmurs, rubs, clicks or gallops  BS 433.   UA - + ketones Results for orders placed or performed in visit on 95/62/13  Basic Metabolic Panel  Result Value Ref Range   Glucose 352 (H) 65 - 99 mg/dL   BUN 13 6 - 20 mg/dL   Creatinine, Ser 0.88 0.76 - 1.27 mg/dL   GFR calc non Af Amer 119 >59 mL/min/1.73   GFR calc Af Amer 138 >59 mL/min/1.73   BUN/Creatinine Ratio 15 9 - 20   Sodium 136 134 - 144 mmol/L   Potassium 4.7 3.5 - 5.2 mmol/L   Chloride 99 96 - 106 mmol/L   CO2 19 (L) 20 - 29 mmol/L   Calcium 9.6 8.7 - 10.2 mg/dL  POCT glucose (manual entry)  Result Value Ref Range   POC Glucose 433 (A) 70 - 99 mg/dl  POCT urinalysis dipstick  Result Value Ref Range   Color, UA yellow    Clarity, UA clear    Glucose, UA 500    Bilirubin, UA negative    Ketones, UA trace    Spec Grav, UA <=1.005 (A) 1.010 - 1.025   Blood, UA negative    pH, UA 6.0 5.0 - 8.0   Protein, UA negative    Urobilinogen, UA 1.0 0.2 or 1.0 E.U./dL   Nitrite, UA negative    Leukocytes, UA Negative Negative   Appearance     Odor    POCT glucose (manual entry)  Result Value Ref Range   POC Glucose 307 (A) 70 - 99 mg/dl    ASSESSMENT AND PLAN: 1. Uncontrolled type 2 diabetes mellitus with peripheral neuropathy (HCC) Blood sugar 433  today with positive ketones in the urine.  Patient given 1 L IV fluid with 20 units of NovoLog.  BMP ordered. -Increase Lantus to 20 units daily.  Start NovoLog 6 units with meals.  Encouraged to check blood sugars at least twice a day and bring in readings on next visit.  Continue to work on eating habits. -  POCT glucose (manual entry) - POCT urinalysis dipstick - insulin aspart (novoLOG) injection 20 Units - sodium chloride 0.9 % bolus 1,000 mL - Insulin Glargine (LANTUS) 100 UNIT/ML Solostar Pen; Inject 20 Units into the skin daily at 10 pm.  Dispense: 15 mL; Refill: 6 - insulin aspart (NOVOLOG FLEXPEN) 100 UNIT/ML FlexPen; Inject 6 Units into the skin 3 (three) times daily with meals.  Dispense: 15 mL; Refill: 11 - Insulin Pen Needle (PEN NEEDLES) 31G X 5 MM MISC; 100 g by Does not apply route 2 (two) times daily.  Dispense: 100 each; Refill: 6 - Basic Metabolic Panel  2. Microalbuminuria due to type 2 diabetes mellitus ([place]) Blood pressure elevated today and was stage 1 on last visit.  Will increase Lisinopril to 5 mg daily - lisinopril (PRINIVIL,ZESTRIL) 5 MG tablet; Take 1 tablet (5 mg total) by mouth daily.  Dispense: 30 tablet; Refill: 3  3. Gastroesophageal reflux disease without esophagitis GERD precautions discussed and encouraged.  Advised to avoid spicy foods, juices, coffee, foods that have tomato paste or sauce in it and try to discontinue smoking - ranitidine (RANITIDINE 150 MAX STRENGTH) 150 MG tablet; Take 1 tablet (150 mg total) by mouth 2 (two) times daily.  Dispense: 60 tablet; Refill: 3  4. Tobacco dependence Commended him on cutting back.  Advised him to set a date to quit completely  5. Moderate episode of recurrent major depressive disorder ([place]) Better on bupropion  Patient was given the opportunity to ask questions.  Patient verbalized understanding of the plan and was able to repeat key elements of the plan.   Orders Placed This Encounter  Procedures  . Basic  Metabolic Panel  . POCT glucose (manual entry)  . POCT urinalysis dipstick  . POCT glucose (manual entry)     Requested Prescriptions   Signed Prescriptions Disp Refills  . Insulin Glargine (LANTUS) 100 UNIT/ML Solostar Pen 15 mL 6    Sig: Inject 20 Units into the skin daily at 10 pm.  . insulin aspart (NOVOLOG FLEXPEN) 100 UNIT/ML FlexPen 15 mL 11    Sig: Inject 6 Units into the skin 3 (three) times daily with meals.  . Insulin Pen Needle (PEN NEEDLES) 31G X 5 MM MISC 100 each 6    Sig: 100 g by Does not apply route 2 (two) times daily.  Marland Kitchen lisinopril (PRINIVIL,ZESTRIL) 5 MG tablet 30 tablet 3    Sig: Take 1 tablet (5 mg total) by mouth daily.  . ranitidine (RANITIDINE 150 MAX STRENGTH) 150 MG tablet 60 tablet 3    Sig: Take 1 tablet (150 mg total) by mouth 2 (two) times daily.    Return in about 1 month (around 01/20/2018).  Karle Plumber, MD, FACP

## 2017-12-21 LAB — BASIC METABOLIC PANEL
BUN / CREAT RATIO: 15 (ref 9–20)
BUN: 13 mg/dL (ref 6–20)
CHLORIDE: 99 mmol/L (ref 96–106)
CO2: 19 mmol/L — ABNORMAL LOW (ref 20–29)
CREATININE: 0.88 mg/dL (ref 0.76–1.27)
Calcium: 9.6 mg/dL (ref 8.7–10.2)
GFR calc Af Amer: 138 mL/min/{1.73_m2} (ref >59)
GFR calc non Af Amer: 119 mL/min/{1.73_m2} (ref >59)
GLUCOSE: 352 mg/dL — AB (ref 65–99)
Potassium: 4.7 mmol/L (ref 3.5–5.2)
Sodium: 136 mmol/L (ref 134–144)

## 2018-01-18 ENCOUNTER — Other Ambulatory Visit: Payer: Self-pay

## 2018-01-18 ENCOUNTER — Emergency Department (HOSPITAL_COMMUNITY)
Admission: EM | Admit: 2018-01-18 | Discharge: 2018-01-18 | Discharge status: Against Medical Advice | Payer: Medicaid Other

## 2018-01-18 NOTE — ED Notes (Signed)
Pt. Called for triage with no response.

## 2018-01-18 NOTE — ED Notes (Addendum)
Called for triage with no response  

## 2018-01-18 NOTE — ED Notes (Signed)
Pt called x3 for triage with no response 

## 2018-01-24 ENCOUNTER — Ambulatory Visit: Payer: Medicaid Other | Admitting: Internal Medicine

## 2018-02-21 ENCOUNTER — Ambulatory Visit: Payer: Medicaid Other | Admitting: Internal Medicine

## 2018-10-30 ENCOUNTER — Emergency Department (HOSPITAL_COMMUNITY)
Admission: EM | Admit: 2018-10-30 | Discharge: 2018-10-31 | Disposition: A | Discharge status: Home / Self Care | Payer: Self-pay | Attending: Emergency Medicine | Admitting: Emergency Medicine

## 2018-10-30 ENCOUNTER — Emergency Department (HOSPITAL_COMMUNITY): Payer: Self-pay

## 2018-10-30 ENCOUNTER — Other Ambulatory Visit: Payer: Self-pay

## 2018-10-30 ENCOUNTER — Encounter (HOSPITAL_COMMUNITY): Payer: Self-pay

## 2018-10-30 DIAGNOSIS — R449 Unspecified symptoms and signs involving general sensations and perceptions: Secondary | ICD-10-CM

## 2018-10-30 DIAGNOSIS — E1140 Type 2 diabetes mellitus with diabetic neuropathy, unspecified: Secondary | ICD-10-CM | POA: Insufficient documentation

## 2018-10-30 DIAGNOSIS — Z794 Long term (current) use of insulin: Secondary | ICD-10-CM | POA: Insufficient documentation

## 2018-10-30 DIAGNOSIS — I10 Essential (primary) hypertension: Secondary | ICD-10-CM | POA: Insufficient documentation

## 2018-10-30 DIAGNOSIS — E1129 Type 2 diabetes mellitus with other diabetic kidney complication: Secondary | ICD-10-CM | POA: Insufficient documentation

## 2018-10-30 DIAGNOSIS — Z87891 Personal history of nicotine dependence: Secondary | ICD-10-CM | POA: Insufficient documentation

## 2018-10-30 DIAGNOSIS — R4189 Other symptoms and signs involving cognitive functions and awareness: Secondary | ICD-10-CM | POA: Insufficient documentation

## 2018-10-30 DIAGNOSIS — R531 Weakness: Secondary | ICD-10-CM | POA: Insufficient documentation

## 2018-10-30 LAB — COMPREHENSIVE METABOLIC PANEL
ALT: 24 U/L (ref 0–44)
AST: 22 U/L (ref 15–41)
Albumin: 4.6 g/dL (ref 3.5–5.0)
Alkaline Phosphatase: 55 U/L (ref 38–126)
Anion gap: 14 (ref 5–15)
BUN: 10 mg/dL (ref 6–20)
CO2: 24 mmol/L (ref 22–32)
Calcium: 9.7 mg/dL (ref 8.9–10.3)
Chloride: 97 mmol/L — ABNORMAL LOW (ref 98–111)
Creatinine, Ser: 0.9 mg/dL (ref 0.61–1.24)
GFR calc Af Amer: >60 mL/min (ref >60)
GFR calc non Af Amer: >60 mL/min (ref >60)
Glucose, Bld: 230 mg/dL — ABNORMAL HIGH (ref 70–99)
Potassium: 4.1 mmol/L (ref 3.5–5.1)
Sodium: 135 mmol/L (ref 135–145)
Total Bilirubin: 1.1 mg/dL (ref 0.3–1.2)
Total Protein: 7.4 g/dL (ref 6.5–8.1)

## 2018-10-30 LAB — I-STAT TROPONIN, ED: Troponin i, poc: 0 ng/mL (ref 0.00–0.08)

## 2018-10-30 LAB — CBC WITH DIFFERENTIAL/PLATELET
Abs Immature Granulocytes: 0.02 10*3/uL (ref 0.00–0.07)
Basophils Absolute: 0 10*3/uL (ref 0.0–0.1)
Basophils Relative: 0 %
Eosinophils Absolute: 0.1 10*3/uL (ref 0.0–0.5)
Eosinophils Relative: 1 %
HCT: 47.5 % (ref 39.0–52.0)
Hemoglobin: 16.6 g/dL (ref 13.0–17.0)
Immature Granulocytes: 0 %
Lymphocytes Relative: 41 %
Lymphs Abs: 2.5 10*3/uL (ref 0.7–4.0)
MCH: 29.2 pg (ref 26.0–34.0)
MCHC: 34.9 g/dL (ref 30.0–36.0)
MCV: 83.6 fL (ref 80.0–100.0)
MONO ABS: 0.4 10*3/uL (ref 0.1–1.0)
Monocytes Relative: 6 %
NEUTROS ABS: 3.1 10*3/uL (ref 1.7–7.7)
Neutrophils Relative %: 52 %
Platelets: 252 10*3/uL (ref 150–400)
RBC: 5.68 MIL/uL (ref 4.22–5.81)
RDW: 11.9 % (ref 11.5–15.5)
WBC: 6.1 10*3/uL (ref 4.0–10.5)
nRBC: 0 % (ref 0.0–0.2)

## 2018-10-30 LAB — RAPID URINE DRUG SCREEN, HOSP PERFORMED
Amphetamines: NOT DETECTED
Barbiturates: NOT DETECTED
Benzodiazepines: NOT DETECTED
Cocaine: NOT DETECTED
Opiates: NOT DETECTED
Tetrahydrocannabinol: NOT DETECTED

## 2018-10-30 LAB — URINALYSIS, ROUTINE W REFLEX MICROSCOPIC
Bacteria, UA: NONE SEEN
Bilirubin Urine: NEGATIVE
Glucose, UA: >=500 mg/dL — AB
Hgb urine dipstick: NEGATIVE
Ketones, ur: 5 mg/dL — AB
Leukocytes, UA: NEGATIVE
Nitrite: NEGATIVE
Protein, ur: 30 mg/dL — AB
Specific Gravity, Urine: 1.025 (ref 1.005–1.030)
pH: 5 (ref 5.0–8.0)

## 2018-10-30 LAB — ETHANOL: Alcohol, Ethyl (B): <10 mg/dL (ref <10)

## 2018-10-30 MED ORDER — HYDROCODONE-ACETAMINOPHEN 5-325 MG PO TABS
1.0000 | ORAL_TABLET | Freq: Once | ORAL | Status: AC
  Administered 2018-10-30: 1 via ORAL
  Filled 2018-10-30: qty 1

## 2018-10-30 NOTE — ED Notes (Signed)
Patient transported to MRI 

## 2018-10-30 NOTE — ED Triage Notes (Signed)
Pt reports right sided pain and numbness for 1.5 weeks (arm and leg). Pt also having balance problems. Denies any injury or recent falls. Grip strength decreased in the right arm. Pt states "I was seen at [facility] on Thursday, they said I had a light stoke but they sent me home and didn't tell me what to do"

## 2018-10-30 NOTE — ED Notes (Signed)
Patient transported to CT 

## 2018-10-30 NOTE — ED Provider Notes (Addendum)
[facility] Provider Note   CSN: [id number] Arrival date & time: 10/30/18  1716     History   Chief Complaint Chief Complaint  Patient presents with  . Numbness  . Arm Pain    HPI [facility] is a 28 y.o. male.  HPI  [facility] is a 28 y.o. male with PMH of chronic bronchitis, chronic pancreatitis, GERD, hepatic steatosis, hypertension, hypertriglyceridemia, molluscum contagiosum, obesity, type 2 diabetes who presents with numbness and weakness for about 1 week on the right side.  He reports that last Tuesday evening he was at work when he had sudden onset numbness and heaviness in his right arm.  This moved down to his right leg.  States he had difficulty balancing at that time.  He came here but he states the wait was too long and then went to [place] on Thursday.  He reports they admitted him and he had an MRI of his brain which showed a "light stroke" but he is not sure if they started him on any medications.  His symptoms have largely remained stable but have worsened slightly.  He reports pain radiating down from his head across his right arm and left leg intermittently.  Has intermittent mild tingling in his left arm and left leg as well.  Last use his insulin at 10 AM this morning.  Normal glucose runs in the low 200s.  Has had blurry vision intermittently but no double vision or blind spots.  Reports intermittent difficulty getting words out and feels his speech is slightly slurred.  He states he has had significant difficulty remembering things recently.  Past Medical History:  Diagnosis Date  . Chronic bronchitis ([facility])    "get it about q year" (11/20/2013)  . Chronic bronchitis ([facility])   . Chronic pancreatitis ([facility]) 10/2015   likely present 11/2013 but no lipase at that time.  due to elevated triglycerides and ETOH  . GERD (gastroesophageal reflux disease)   . Hepatic steatosis 11/2013   seen on CT, LFTs normal.  . Hypertension    . Hypertriglyceridemia   . Microalbuminuria   . Molluscum contagiosum 06/2015   upper extremities.   . Obesity   . Type II diabetes mellitus ([facility]) dx'd 11/2013   oral agents and insulin    Patient Active Problem List   Diagnosis Date Noted  . Moderate episode of recurrent major depressive disorder ([facility]) 11/22/2017  . Microalbuminuria due to type 2 diabetes mellitus ([facility]) 11/22/2017  . Tobacco dependence 03/11/2017  . Positive depression screening 03/11/2017  . Abnormal CT of the abdomen   . Gastroparesis   . Obesity (BMI 30-39.9) 11/15/2015  . Chronic pancreatitis ([facility]) 11/13/2015  . 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 Essential hypertension 10/31/2015  . Gastroesophageal reflux disease without esophagitis 10/31/2015  . Molluscum contagiosum 10/16/2015  . Hypertriglyceridemia 06/25/2015  . Type 2 diabetes mellitus with diabetic neuropathy, unspecified ([facility]) 01/11/2014    Past Surgical History:  Procedure Laterality Date  . ESOPHAGOGASTRODUODENOSCOPY N/A 11/21/2013   Procedure: ESOPHAGOGASTRODUODENOSCOPY (EGD);  Surgeon: Wonda Horner, MD;  Location: [facility];  Service: Endoscopy;  Laterality: N/A;  . TONSILLECTOMY AND ADENOIDECTOMY  ~ 1999        Home Medications    Prior to Admission medications   Medication Sig Start Date End Date Taking? Authorizing Provider  ibuprofen (ADVIL,MOTRIN) 200 MG tablet Take 400 mg by mouth every 6 (six) hours as needed for headache (pain).   Yes [provider]  insulin aspart (NOVOLOG) 100 UNIT/ML injection Inject 5-10 Units into the skin See admin instructions. Inject 5-10 units subcutaneously three times daily after meals - CBG <200 5 units, >200 10 units   Yes [provider]  Insulin Glargine (LANTUS) 100 UNIT/ML Solostar Pen Inject 20 Units into the skin daily at 10 pm. Patient taking differently: Inject 15 Units into the skin at bedtime.  12/20/17  Yes Ladell Pier, MD  metFORMIN (GLUCOPHAGE) 500 MG tablet  Take 1 tablet (500 mg total) by mouth 2 (two) times daily with a meal. Patient taking differently: Take 250 mg by mouth daily as needed (CBG >200).  11/22/17  Yes Ladell Pier, MD  ranitidine (RANITIDINE 150 MAX STRENGTH) 150 MG tablet Take 1 tablet (150 mg total) by mouth 2 (two) times daily. Patient taking differently: Take 150 mg by mouth 2 (two) times daily as needed for heartburn (acid reflux).  12/20/17  Yes Ladell Pier, MD  Blood Glucose Monitoring Suppl (TRUE METRIX METER) w/Device KIT Use as directed 11/22/17   Ladell Pier, MD  buPROPion (ZYBAN) 150 MG 12 hr tablet Take 1 tablet (150 mg total) by mouth 2 (two) times daily. Patient not taking: Reported on 10/30/2018 11/22/17   Ladell Pier, MD  fenofibrate (TRICOR) 145 MG tablet Take 1 tablet (145 mg total) by mouth daily. Patient not taking: Reported on 10/30/2018 11/22/17   Ladell Pier, MD  glucose blood (TRUE METRIX BLOOD GLUCOSE TEST) test strip Use as instructed 11/22/17   Ladell Pier, MD  insulin aspart (NOVOLOG FLEXPEN) 100 UNIT/ML FlexPen Inject 6 Units into the skin 3 (three) times daily with meals. Patient not taking: Reported on 10/30/2018 12/20/17   Ladell Pier, MD  Insulin Pen Needle (PEN NEEDLES) 31G X 5 MM MISC 100 g by Does not apply route 2 (two) times daily. 12/20/17   Ladell Pier, MD  Insulin Syringes, Disposable, U-100 1 ML MISC Use as directed with insulin 03/11/17   Ladell Pier, MD  Lancets MISC Use as directed 11/22/17   Ladell Pier, MD  lisinopril (PRINIVIL,ZESTRIL) 5 MG tablet Take 1 tablet (5 mg total) by mouth daily. Patient not taking: Reported on 10/30/2018 12/20/17   Ladell Pier, MD  terbinafine (LAMISIL AT) 1 % cream Apply to affected area BID Patient not taking: Reported on 10/30/2018 11/22/17   Ladell Pier, MD    Family History Family History  Problem Relation Age of Onset  . Diabetes Mellitus II Father   . Heart failure Father   .  Asthma Mother   . Diabetes Mellitus I Sister     Social History Social History   Tobacco Use  . Smoking status: Former Smoker    Packs/day: 1.00    Years: 3.00    Pack years: 3.00    Types: Cigarettes    Last attempt to quit: 12/11/2013    Years since quitting: 4.8  . Smokeless tobacco: Never Used  Substance Use Topics  . Alcohol use: Yes    Comment: 11/27/2015 "might have a beer once/month"  . Drug use: Yes    Frequency: 2.0 times per week    Types: Marijuana    Comment: 11/27/2015 "used to use marijuana; used it last  in ~ 2014"     Allergies   Patient has no known allergies.   Review of Systems Review of Systems  Constitutional: Negative for chills and fever.  HENT: Negative for ear pain  and sore throat.   Eyes: Positive for visual disturbance. Negative for pain.  Respiratory: Negative for cough and shortness of breath.   Cardiovascular: Positive for chest pain (Transient, last night.  Resolved after inhaler.). Negative for palpitations.  Gastrointestinal: Negative for abdominal pain and vomiting.  Genitourinary: Negative for dysuria and hematuria.  Musculoskeletal: Positive for arthralgias and gait problem. Negative for back pain.  Skin: Negative for color change and rash.  Neurological: Positive for dizziness, speech difficulty, weakness and numbness. Negative for seizures and syncope.  All other systems reviewed and are negative.    Physical Exam Updated Vital Signs BP 130/78 (BP Location: Left Arm)   Pulse 84   Temp 99 F (37.2 C) (Oral)   Resp 14   SpO2 100%   Physical Exam Vitals signs and nursing note reviewed.  Constitutional:      Appearance: Normal appearance. He is well-developed. He is obese. He is ill-appearing.  HENT:     Head: Normocephalic and atraumatic.     Jaw: There is normal jaw occlusion.  Eyes:     Conjunctiva/sclera: Conjunctivae normal.  Neck:     Musculoskeletal: Full passive range of motion without pain, normal range of motion  and neck supple.  Cardiovascular:     Rate and Rhythm: Normal rate and regular rhythm.     Heart sounds: No murmur.  Pulmonary:     Effort: Pulmonary effort is normal. No respiratory distress.     Breath sounds: Normal breath sounds.  Abdominal:     Palpations: Abdomen is soft.     Tenderness: There is no abdominal tenderness.  Skin:    General: Skin is warm and dry.  Neurological:     Mental Status: He is alert and oriented to person, place, and time.     GCS: GCS eye subscore is 4. GCS verbal subscore is 5. GCS motor subscore is 6.     Cranial Nerves: Dysarthria (mild) present. No facial asymmetry.     Sensory: Sensory deficit present.     Motor: Weakness present. No tremor.     Coordination: Coordination abnormal. Finger-Nose-Finger Test abnormal. Impaired rapid alternating movements.     Comments: Mild dysmetria on the right.  Dysdiadochokinesia of the right upper extremity.  No pronator drift.  Decrease in station to light touch, pain, and temperature across the right hemi-face, right hemi-chest and abdomen, right arm and right leg.  No myoclonus.  Reflexes normal.  Strength 4-5 in the right arm and leg compared to the left which is 5 out of 5.  Psychiatric:        Behavior: Behavior is cooperative.      ED Treatments / Results  Labs (all labs ordered are listed, but only abnormal results are displayed) Labs Reviewed  URINALYSIS, ROUTINE W REFLEX MICROSCOPIC - Abnormal; Notable for the following components:      Result Value   Glucose, UA >=500 (*)    Ketones, ur 5 (*)    Protein, ur 30 (*)    All other components within normal limits  COMPREHENSIVE METABOLIC PANEL - Abnormal; Notable for the following components:   Chloride 97 (*)    Glucose, Bld 230 (*)    All other components within normal limits  ETHANOL  RAPID URINE DRUG SCREEN, HOSP PERFORMED  CBC WITH DIFFERENTIAL/PLATELET  I-STAT TROPONIN, ED  CBG MONITORING, ED    EKG EKG  Interpretation  Date/Time:  Sunday October 30 2018 17:26:05 EST Ventricular Rate:  97 PR Interval:  QRS Duration: 80 QT Interval:  331 QTC Calculation: 421 R Axis:   83 Text Interpretation:  Sinus rhythm improved TWI in inferior and lateral leads compared to january 2019 Confirmed by Merrily Pew [phone]) on 10/30/2018 7:03:24 PM   Radiology Ct Head Wo Contrast  Result Date: 10/30/2018 CLINICAL DATA:  Right-sided numbness for 5 days. EXAM: CT HEAD WITHOUT CONTRAST TECHNIQUE: Contiguous axial images were obtained from the base of the skull through the vertex without intravenous contrast. COMPARISON:  CT scan October 21, 2018.  MRI October 21, 2018. FINDINGS: Brain: There is a prominent perivascular space on the left which is stable. No acute cortical ischemia or infarct. Ventricles and sulci are normal. Cerebellum, brainstem, and basal cisterns are normal. No mass effect or midline shift. Vascular: No hyperdense vessel or unexpected calcification. Skull: Normal. Negative for fracture or focal lesion. Sinuses/Orbits: Opacification of left-sided ethmoid air cells. Other: None. IMPRESSION: No cause for right-sided numbness identified.  No interval changes. Electronically Signed   By: Dorise Bullion III M.D   On: 10/30/2018 18:52   Mr Brain W And Wo Contrast  Result Date: 10/31/2018 CLINICAL DATA:  Right-sided arm and leg numbness for 1 and half weeks. EXAM: MRI HEAD WITHOUT AND WITH CONTRAST MRI CERVICAL SPINE WITHOUT AND WITH CONTRAST TECHNIQUE: Multiplanar, multiecho pulse sequences of the brain and surrounding structures, and cervical spine, to include the craniocervical junction and cervicothoracic junction, were obtained without and with intravenous contrast. CONTRAST:  10 mL Gadavist COMPARISON:  MRI brain 10/21/2018 FINDINGS: MRI HEAD FINDINGS BRAIN: There is no acute infarct, acute hemorrhage, hydrocephalus or extra-axial collection. The midline structures are normal. No midline shift or  other mass effect. The white matter signal is normal for the patient's age. The cerebral and cerebellar volume are age-appropriate. Susceptibility-sensitive sequences show no chronic microhemorrhage or superficial siderosis. No abnormal contrast enhancement. Prominent perivascular space of the left lentiform nucleus. VASCULAR: Major intracranial arterial and venous sinus flow voids are normal. SKULL AND UPPER CERVICAL SPINE: Calvarial bone marrow signal is normal. There is no skull base mass. Visualized upper cervical spine and soft tissues are normal. SINUSES/ORBITS: Bilateral maxillary sinus retention cyst. No mastoid or middle ear effusion. The orbits are normal. MRI CERVICAL SPINE FINDINGS Alignment: Normal. Vertebrae: No focal marrow lesion. No compression fracture or evidence of discitis osteomyelitis. Cord: Normal caliber and signal.  No abnormal contrast enhancement. Posterior Fossa, vertebral arteries, paraspinal tissues: Visualized posterior fossa is normal. Vertebral artery flow voids are preserved. No prevertebral effusion. Disc levels: Sagittal imaging includes the atlantoaxial joint to the level of the T1-2 disc space, with axial imaging of the disc spaces from C2-3 to C7-T1. No disc herniation or stenosis. IMPRESSION: Normal MRI of the brain and cervical spine. Electronically Signed   By: Ulyses Jarred M.D.   On: 10/31/2018 00:43   Mr Cervical Spine W Or Wo Contrast  Result Date: 10/31/2018 CLINICAL DATA:  Right-sided arm and leg numbness for 1 and half weeks. EXAM: MRI HEAD WITHOUT AND WITH CONTRAST MRI CERVICAL SPINE WITHOUT AND WITH CONTRAST TECHNIQUE: Multiplanar, multiecho pulse sequences of the brain and surrounding structures, and cervical spine, to include the craniocervical junction and cervicothoracic junction, were obtained without and with intravenous contrast. CONTRAST:  10 mL Gadavist COMPARISON:  MRI brain 10/21/2018 FINDINGS: MRI HEAD FINDINGS BRAIN: There is no acute infarct,  acute hemorrhage, hydrocephalus or extra-axial collection. The midline structures are normal. No midline shift or other mass effect. The white matter signal is normal for the  patient's age. The cerebral and cerebellar volume are age-appropriate. Susceptibility-sensitive sequences show no chronic microhemorrhage or superficial siderosis. No abnormal contrast enhancement. Prominent perivascular space of the left lentiform nucleus. VASCULAR: Major intracranial arterial and venous sinus flow voids are normal. SKULL AND UPPER CERVICAL SPINE: Calvarial bone marrow signal is normal. There is no skull base mass. Visualized upper cervical spine and soft tissues are normal. SINUSES/ORBITS: Bilateral maxillary sinus retention cyst. No mastoid or middle ear effusion. The orbits are normal. MRI CERVICAL SPINE FINDINGS Alignment: Normal. Vertebrae: No focal marrow lesion. No compression fracture or evidence of discitis osteomyelitis. Cord: Normal caliber and signal.  No abnormal contrast enhancement. Posterior Fossa, vertebral arteries, paraspinal tissues: Visualized posterior fossa is normal. Vertebral artery flow voids are preserved. No prevertebral effusion. Disc levels: Sagittal imaging includes the atlantoaxial joint to the level of the T1-2 disc space, with axial imaging of the disc spaces from C2-3 to C7-T1. No disc herniation or stenosis. IMPRESSION: Normal MRI of the brain and cervical spine. Electronically Signed   By: Ulyses Jarred M.D.   On: 10/31/2018 00:43    Procedures Procedures (including critical care time)  Medications Ordered in ED Medications  HYDROcodone-acetaminophen (NORCO/VICODIN) 5-325 MG per tablet 1 tablet (1 tablet Oral Given 10/30/18 2019)  gadobutrol (GADAVIST) 1 MMOL/ML injection 10 mL (10 mLs Intravenous Contrast Given 10/30/18 2352)     Initial Impression / Assessment and Plan / ED Course  I have reviewed the triage vital signs and the nursing notes.  Pertinent labs & imaging  results that were available during my care of the patient were reviewed by me and considered in my medical decision making (see chart for details).     MDM:  Imaging: CT and MRI brain report obtained from outside hospital negative.  CT head without contrast unremarkable.  MRI brain and C-spine with and without contrast normal.  ED Provider Interpretation of EKG: Sinus rhythm with rate of 97 bpm, normal axis, no ST segment elevation or depression.  No bundle branch block or delta wave.  Biphasic T wave in lead III similar to prior EKG.  Labs: CBC normal, CMP unremarkable exception of glucose of 230.  CO2 24 and gap 14, troponin undetectable, EtOH negative, UA with glucose and protein otherwise unremarkable, UDS negative  On initial evaluation, patient appears ill. Afebrile and hemodynamically stable. Alert and oriented x4, pleasant, and cooperative.  Patient presents with concern for stroke after recent hospitalization as detailed above.  On exam, patient has right hemibody deficits and evidence for mild dysmetria and mild dysarthria as detailed above in the physical exam.  Normal pulses.  Vitals normal.  EKG normal and troponin negative.  No dyspnea.  Low suspicion for pericarditis, myocarditis, ACS.  Low suspicion for dissection given his age and presentation.  No evidence for significant metabolic derangement or intoxication at this time.  Records obtained from [facility] rocking him showing negative CTA and MR brain without contrast.  However, he reports that he may have been told he had a stroke.  He was not sure.  Was not placed on aspirin or other anticoagulants.  There is concern that patient may have other underlying disorder such as upper cervical myelopathy, MS, or stroke syndrome that has evolved since his prior imaging given the slight worsening of his symptoms.  He has had no fever or chills and no leukocytosis.  Low suspicion for meningitis at this time the patient does report intermittent pain  in his neck that radiates down his right  arm and right leg.  Further worsens concern for possible radiculopathy and infectious etiologies to remain on the differential although lower likelihood.  MRI brain and cervical spine normal.  Possibly a peripheral neuropathy. Low suspicion for Guillan Barr syndrome.  Low suspicion for significant nutritional deficiency.  Low suspicion for myasthenia gravis.    On return from MRI, he was able to walk without assistance and without significant pain.  No ataxia and no antalgic gait.  Negative Romberg.  Able to turn and walk back to the bed without assistance.  No lightheadedness or dizziness.  He was discharged in stable condition with close instructions to follow-up with neurology as an outpatient and follow-up with his primary care doctor.  He verbalized understanding.  The plan for this patient was discussed with Dr. Dayna Barker who voiced agreement and who oversaw evaluation and treatment of this patient.   The patient was fully informed and involved with the history taking, evaluation, workup including labs/images, and plan. The patient's concerns and questions were addressed to the patient's satisfaction and he expressed agreement with the plan to MRI.    Final Clinical Impressions(s) / ED Diagnoses   Final diagnoses:  Right sided weakness  Sensory deficit, right    ED Discharge Orders    None       Imogean Ciampa, Rodena Goldmann, MD 10/31/18 [id number]    Louellen Molder, MD 10/31/18 Margaretha Glassing    Merrily Pew, MD 10/31/18 1546

## 2018-10-30 NOTE — ED Provider Notes (Signed)
I saw and evaluated the patient, reviewed the resident's note and I agree with the findings and plan with the following exceptions.   28 year old male that is reportedly admitted to [facility] rocking him last week and was under the impression that he had a stroke causing right-sided deficits but was sent home without any explanation or follow-up.  Patient states that he started with paresthesias in his right arm 12 days ago that progressively worsened to his right leg.  Soundly it was almost intermittent in nature initially but then happened while he was at work she was sent to the hospital from there he was kept overnight done an MRI that he reports was negative but did send him home with paperwork that mentioned stroke.  Patient has persistent symptoms.  On my examination he has right grip strength and right foot dorsiflexion plantarflexion weakness.  Diminished sensation to entirety of his right leg compared to his left and same with his right upper arm.  He states he has had some neck pain on the right side which not reproducible on palpation. Unclear etiology of patient's symptoms.  Consider possible brain etiology however not really consistent with his age and lack of risk factors and being sent home from the hospital without any medications or follow-up.  Also consider possible vertebral artery dissection or carotid artery dissection or possible cervical myelopathy radiculopathy but it does not explain cranial nerve and lower extremity deficits as well.  We will plan to get records and initiate work-up based on what has already been done.   EKG Interpretation  Date/Time:  Sunday October 30 2018 17:26:05 EST Ventricular Rate:  97 PR Interval:    QRS Duration: 80 QT Interval:  331 QTC Calculation: 421 R Axis:   83 Text Interpretation:  Sinus rhythm improved TWI in inferior and lateral leads compared to january 2019 Confirmed by Marily Memos [phone]) on 10/30/2018 7:03:24 PM         Arneisha Kincannon, Barbara Cower,  MD 10/31/18 1546

## 2018-10-31 MED ORDER — GADOBUTROL 1 MMOL/ML IV SOLN
10.0000 mL | Freq: Once | INTRAVENOUS | Status: AC | PRN
  Administered 2018-10-30: 10 mL via INTRAVENOUS

## 2018-12-01 ENCOUNTER — Emergency Department (HOSPITAL_COMMUNITY)
Admission: EM | Admit: 2018-12-01 | Discharge: 2018-12-01 | Disposition: A | Discharge status: Home / Self Care | Payer: Medicaid Other | Attending: Emergency Medicine | Admitting: Emergency Medicine

## 2018-12-01 ENCOUNTER — Other Ambulatory Visit: Payer: Self-pay

## 2018-12-01 ENCOUNTER — Encounter (HOSPITAL_COMMUNITY): Payer: Self-pay | Admitting: Emergency Medicine

## 2018-12-01 ENCOUNTER — Emergency Department (HOSPITAL_COMMUNITY): Payer: Medicaid Other

## 2018-12-01 DIAGNOSIS — R200 Anesthesia of skin: Secondary | ICD-10-CM | POA: Insufficient documentation

## 2018-12-01 DIAGNOSIS — Z79899 Other long term (current) drug therapy: Secondary | ICD-10-CM | POA: Insufficient documentation

## 2018-12-01 DIAGNOSIS — R0789 Other chest pain: Secondary | ICD-10-CM

## 2018-12-01 DIAGNOSIS — Z794 Long term (current) use of insulin: Secondary | ICD-10-CM | POA: Insufficient documentation

## 2018-12-01 DIAGNOSIS — Z87891 Personal history of nicotine dependence: Secondary | ICD-10-CM | POA: Insufficient documentation

## 2018-12-01 DIAGNOSIS — F1490 Cocaine use, unspecified, uncomplicated: Secondary | ICD-10-CM | POA: Insufficient documentation

## 2018-12-01 DIAGNOSIS — I10 Essential (primary) hypertension: Secondary | ICD-10-CM | POA: Insufficient documentation

## 2018-12-01 DIAGNOSIS — E119 Type 2 diabetes mellitus without complications: Secondary | ICD-10-CM | POA: Insufficient documentation

## 2018-12-01 LAB — URINALYSIS, ROUTINE W REFLEX MICROSCOPIC
Bilirubin Urine: NEGATIVE
Hgb urine dipstick: NEGATIVE
Ketones, ur: 5 mg/dL — AB
Leukocytes,Ua: NEGATIVE
Nitrite: NEGATIVE
Protein, ur: NEGATIVE mg/dL
Specific Gravity, Urine: 1.037 — ABNORMAL HIGH (ref 1.005–1.030)
pH: 6 (ref 5.0–8.0)

## 2018-12-01 LAB — CBC
HCT: 44.9 % (ref 39.0–52.0)
Hemoglobin: 15.6 g/dL (ref 13.0–17.0)
MCH: 29.3 pg (ref 26.0–34.0)
MCHC: 34.7 g/dL (ref 30.0–36.0)
MCV: 84.2 fL (ref 80.0–100.0)
Platelets: 259 10*3/uL (ref 150–400)
RBC: 5.33 MIL/uL (ref 4.22–5.81)
RDW: 12.1 % (ref 11.5–15.5)
WBC: 6.7 10*3/uL (ref 4.0–10.5)
nRBC: 0 % (ref 0.0–0.2)

## 2018-12-01 LAB — CBG MONITORING, ED: Glucose-Capillary: 276 mg/dL — ABNORMAL HIGH (ref 70–99)

## 2018-12-01 LAB — BASIC METABOLIC PANEL
Anion gap: 11 (ref 5–15)
BUN: 11 mg/dL (ref 6–20)
CO2: 22 mmol/L (ref 22–32)
CREATININE: 1.13 mg/dL (ref 0.61–1.24)
Calcium: 9.2 mg/dL (ref 8.9–10.3)
Chloride: 99 mmol/L (ref 98–111)
GFR calc Af Amer: >60 mL/min (ref >60)
GFR calc non Af Amer: >60 mL/min (ref >60)
Glucose, Bld: 404 mg/dL — ABNORMAL HIGH (ref 70–99)
Potassium: 3.8 mmol/L (ref 3.5–5.1)
Sodium: 132 mmol/L — ABNORMAL LOW (ref 135–145)

## 2018-12-01 LAB — I-STAT TROPONIN, ED
Troponin i, poc: 0 ng/mL (ref 0.00–0.08)
Troponin i, poc: 0 ng/mL (ref 0.00–0.08)

## 2018-12-01 NOTE — ED Provider Notes (Signed)
[facility] EMERGENCY DEPARTMENT Provider Note   CSN: [id number] Arrival date & time: 12/01/18  1813    History   Chief Complaint Chief Complaint  Patient presents with  . Chest Pain    HPI Brent Costa is a 28 y.o. male with a history of IDDM T2, HTN, chronic pancreatitis, GERD, hepatic steatosis presents to the emergency department with a chief complaint of chest pain.  The patient reports that he snorted cocaine intranasally at approximately 5 PM and developed diffuse, constant sharp chest pain almost immediately after.  He denies cough, palpitations, shortness of breath, numbness, weakness, leg swelling, nausea, vomiting, or abdominal pain.  He reports the chest pain has slowly started to improve since onset, but he has been feeling dizzy and weak, which has also started to improve along with his chest pain.  He has been able to ambulate without difficulty.  No history of prior cocaine use.  He denies any associated IV or recreational drug use along with cocaine.  No alcohol use.  He also reports that he has been checking his blood sugar at home and the highest reading has been 225, which was approximately 2 weeks ago.  He denies missing any doses of his home insulin.  He is due for his nighttime dose of NovoLog and Lantus at this time.  He also reports that he has been having intermittent numbness to his right arm and leg.  He was seen for the same in the ER on January 26 and had a negative work-up for stroke.  He was advised to follow-up with neurology, but has not called to make an appointment.  He reports he continues to have intermittent numbness of the right arm and leg as well as paresthesias that feel like pins-and-needles.  The sensations come and go.  No change in quality or severity of intermittent numbness since onset.  He has no numbness at this time.     The history is provided by the patient. No language interpreter was used.    Past Medical  History:  Diagnosis Date  . Chronic bronchitis ([place])    "get it about q year" (11/20/2013)  . Chronic bronchitis ([place])   . Chronic pancreatitis ([place]) 10/2015   likely present 11/2013 but no lipase at that time.  due to elevated triglycerides and ETOH  . GERD (gastroesophageal reflux disease)   . Hepatic steatosis 11/2013   seen on CT, LFTs normal.  . Hypertension   . Hypertriglyceridemia   . Microalbuminuria   . Molluscum contagiosum 06/2015   upper extremities.   . Obesity   . Type II diabetes mellitus ([place]) dx'd 11/2013   oral agents and insulin    Patient Active Problem List   Diagnosis Date Noted  . Moderate episode of recurrent major depressive disorder ([place]) 11/22/2017  . Microalbuminuria due to type 2 diabetes mellitus ([place]) 11/22/2017  . Tobacco dependence 03/11/2017  . Positive depression screening 03/11/2017  . Abnormal CT of the abdomen   . Gastroparesis   . Obesity (BMI 30-39.9) 11/15/2015  . Chronic pancreatitis ([place]) 11/13/2015  . 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 Essential hypertension 10/31/2015  . Gastroesophageal reflux disease without esophagitis 10/31/2015  . Molluscum contagiosum 10/16/2015  . Hypertriglyceridemia 06/25/2015  . Type 2 diabetes mellitus with diabetic neuropathy, unspecified ([place]) 01/11/2014    Past Surgical History:  Procedure Laterality Date  . ESOPHAGOGASTRODUODENOSCOPY N/A 11/21/2013   Procedure: ESOPHAGOGASTRODUODENOSCOPY (EGD);  Surgeon: Wonda Horner, MD;  Location: [facility] ENDOSCOPY;  Service: Endoscopy;  Laterality: N/A;  . TONSILLECTOMY AND ADENOIDECTOMY  ~ 1999        Home Medications    Prior to Admission medications   Medication Sig Start Date End Date Taking? Authorizing Provider  Blood Glucose Monitoring Suppl (TRUE METRIX METER) w/Device KIT Use as directed 11/22/17   Ladell Pier, MD  buPROPion (ZYBAN) 150 MG 12 hr tablet Take 1 tablet (150 mg total) by mouth 2 (two) times daily. Patient not taking: Reported on  10/30/2018 11/22/17   Ladell Pier, MD  fenofibrate (TRICOR) 145 MG tablet Take 1 tablet (145 mg total) by mouth daily. Patient not taking: Reported on 10/30/2018 11/22/17   Ladell Pier, MD  glucose blood (TRUE METRIX BLOOD GLUCOSE TEST) test strip Use as instructed 11/22/17   Ladell Pier, MD  ibuprofen (ADVIL,MOTRIN) 200 MG tablet Take 400 mg by mouth every 6 (six) hours as needed for headache (pain).    [provider]  insulin aspart (NOVOLOG FLEXPEN) 100 UNIT/ML FlexPen Inject 6 Units into the skin 3 (three) times daily with meals. Patient not taking: Reported on 10/30/2018 12/20/17   Ladell Pier, MD  insulin aspart (NOVOLOG) 100 UNIT/ML injection Inject 5-10 Units into the skin See admin instructions. Inject 5-10 units subcutaneously three times daily after meals - CBG <200 5 units, >200 10 units    [provider]  Insulin Glargine (LANTUS) 100 UNIT/ML Solostar Pen Inject 20 Units into the skin daily at 10 pm. Patient taking differently: Inject 15 Units into the skin at bedtime.  12/20/17   Ladell Pier, MD  Insulin Pen Needle (PEN NEEDLES) 31G X 5 MM MISC 100 g by Does not apply route 2 (two) times daily. 12/20/17   Ladell Pier, MD  Insulin Syringes, Disposable, U-100 1 ML MISC Use as directed with insulin 03/11/17   Ladell Pier, MD  Lancets MISC Use as directed 11/22/17   Ladell Pier, MD  lisinopril (PRINIVIL,ZESTRIL) 5 MG tablet Take 1 tablet (5 mg total) by mouth daily. Patient not taking: Reported on 10/30/2018 12/20/17   Ladell Pier, MD  metFORMIN (GLUCOPHAGE) 500 MG tablet Take 1 tablet (500 mg total) by mouth 2 (two) times daily with a meal. Patient taking differently: Take 250 mg by mouth daily as needed (CBG >200).  11/22/17   Ladell Pier, MD  ranitidine (RANITIDINE 150 MAX STRENGTH) 150 MG tablet Take 1 tablet (150 mg total) by mouth 2 (two) times daily. Patient taking differently: Take 150 mg by mouth 2 (two)  times daily as needed for heartburn (acid reflux).  12/20/17   Ladell Pier, MD  terbinafine (LAMISIL AT) 1 % cream Apply to affected area BID Patient not taking: Reported on 10/30/2018 11/22/17   Ladell Pier, MD    Family History Family History  Problem Relation Age of Onset  . Diabetes Mellitus II Father   . Heart failure Father   . Asthma Mother   . Diabetes Mellitus I Sister     Social History Social History   Tobacco Use  . Smoking status: Former Smoker    Packs/day: 1.00    Years: 3.00    Pack years: 3.00    Types: Cigarettes    Last attempt to quit: 12/11/2013    Years since quitting: 4.9  . Smokeless tobacco: Never Used  Substance Use Topics  . Alcohol use: Yes    Comment: 11/27/2015 "might have a beer once/month"  .  Drug use: Yes    Frequency: 2.0 times per week    Types: Marijuana    Comment: 11/27/2015 "used to use marijuana; used it last  in ~ 2014"     Allergies   Patient has no known allergies.   Review of Systems Review of Systems  Constitutional: Negative for appetite change, chills and fever.  HENT: Negative for congestion and sore throat.   Eyes: Negative for visual disturbance.  Respiratory: Negative for cough, shortness of breath and wheezing.   Cardiovascular: Positive for chest pain. Negative for palpitations and leg swelling.  Gastrointestinal: Negative for abdominal pain, diarrhea, nausea and vomiting.  Genitourinary: Negative for dysuria.  Musculoskeletal: Negative for back pain and myalgias.  Skin: Negative for rash.  Allergic/Immunologic: Negative for immunocompromised state.  Neurological: Positive for dizziness, weakness and numbness (intermittment). Negative for headaches.  Psychiatric/Behavioral: Negative for confusion.   Physical Exam Updated Vital Signs BP 137/77   Pulse 78   Temp 98.1 F (36.7 C) (Oral)   Resp 18   Ht '5\' 9"'  (1.753 m)   Wt 108 kg   SpO2 100%   BMI 35.15 kg/m   Physical Exam Vitals signs and  nursing note reviewed.  Constitutional:      Appearance: He is well-developed.  HENT:     Head: Normocephalic.  Eyes:     Extraocular Movements: Extraocular movements intact.     Conjunctiva/sclera: Conjunctivae normal.     Pupils: Pupils are equal, round, and reactive to light.  Neck:     Musculoskeletal: Neck supple.  Cardiovascular:     Rate and Rhythm: Normal rate and regular rhythm.     Pulses:          Radial pulses are 2+ on the right side and 2+ on the left side.       Dorsalis pedis pulses are 2+ on the right side and 2+ on the left side.       Posterior tibial pulses are 2+ on the right side and 2+ on the left side.     Heart sounds: Normal heart sounds. No murmur. No friction rub. No gallop. No S3 or S4 sounds.   Pulmonary:     Effort: Pulmonary effort is normal. No tachypnea or accessory muscle usage.     Breath sounds: No stridor. No wheezing, rhonchi or rales.  Abdominal:     General: Bowel sounds are normal. There is no distension or abdominal bruit.     Palpations: Abdomen is soft.  Musculoskeletal:     Right lower leg: He exhibits no tenderness. No edema.     Left lower leg: He exhibits no tenderness. No edema.  Skin:    General: Skin is warm and dry.     Capillary Refill: Capillary refill takes less than 2 seconds.  Neurological:     General: No focal deficit present.     Mental Status: He is alert.  Psychiatric:        Behavior: Behavior normal.      ED Treatments / Results  Labs (all labs ordered are listed, but only abnormal results are displayed) Labs Reviewed  BASIC METABOLIC PANEL - Abnormal; Notable for the following components:      Result Value   Sodium 132 (*)    Glucose, Bld 404 (*)    All other components within normal limits  URINALYSIS, ROUTINE W REFLEX MICROSCOPIC - Abnormal; Notable for the following components:   Specific Gravity, Urine 1.037 (*)    Glucose, UA >=  500 (*)    Ketones, ur 5 (*)    Bacteria, UA RARE (*)    All other  components within normal limits  CBG MONITORING, ED - Abnormal; Notable for the following components:   Glucose-Capillary 276 (*)    All other components within normal limits  CBC  I-STAT TROPONIN, ED  I-STAT TROPONIN, ED    EKG EKG Interpretation  Date/Time:  Thursday December 01 2018 18:25:49 EST Ventricular Rate:  102 PR Interval:  140 QRS Duration: 82 QT Interval:  342 QTC Calculation: 445 R Axis:   94 Text Interpretation:  Sinus tachycardia Rightward axis Nonspecific T wave abnormality Abnormal ECG No significant change since last tracing Confirmed by Deno Etienne [phone]) on 12/01/2018 11:05:45 PM   Radiology Dg Chest 2 View  Result Date: 12/01/2018 CLINICAL DATA:  Chest pain EXAM: CHEST - 2 VIEW COMPARISON:  10/12/2017 FINDINGS: Heart and mediastinal contours are within normal limits. No focal opacities or effusions. No acute bony abnormality. IMPRESSION: No active cardiopulmonary disease. Electronically Signed   By: Rolm Baptise M.D.   On: 12/01/2018 19:09    Procedures Procedures (including critical care time)  Medications Ordered in ED Medications - No data to display   Initial Impression / Assessment and Plan / ED Course  I have reviewed the triage vital signs and the nursing notes.  Pertinent labs & imaging results that were available during my care of the patient were reviewed by me and considered in my medical decision making (see chart for details).        28 year old male with a history of IDDM T2, HTN, chronic pancreatitis, GERD, hepatic steatosis presents with a sudden onset chest pain following cocaine use with associated weakness and dizziness.  Chest pain has now resolved and weakness and dizziness have significantly improved.  EKG with sinus tachycardia, but otherwise unchanged from previous.  Sinus tachycardia has resolved without treatment.  Delta troponin is negative.  Initial glucose is 404, but repeat CBG is 276.  Anion gap is normal and bicarb is  normal.  Recommended taking his nighttime dose of Lantus at discharge.  Mild hyponatremia, which I suspect is secondary to mild dehydration.  Labs are otherwise reassuring.  He is having no vomiting or diarrhea.  Recommend p.o. rehydration at home.  He reports that he has continued to have intermittent right arm and leg numbness.  No change since previous evaluation in the ED on January 26.  He has not followed up with neurology in the clinic.  He is having no numbness at this time.  Recommended outpatient neurology follow-up regarding his symptoms based on complexity of previous work-up.  I do not feel that further urgent or emergent work-up is indicated at this time.  Strict return precautions given.  He is hemodynamically stable and in no acute distress.  Safe for discharge home with outpatient follow-up at this time.  Final Clinical Impressions(s) / ED Diagnoses   Final diagnoses:  Cocaine use  Atypical chest pain    ED Discharge Orders    None       Drayk Humbarger A, PA-C 12/01/18 [place], Dan, DO 12/02/18 0000

## 2018-12-01 NOTE — Discharge Instructions (Addendum)
Thank you for allowing me to care for you today in the Emergency Department.   Avoid using cocaine.   Take your Lantus when you get home.  Follow-up with neurology regarding the numbness you have been experiencing.  Return to the emergency department if you develop new or worsening symptoms including chest pain after cocaine use if you do not stop using cocaine, severe shortness of breath, high fever with chest pain, if you pass out, or other new, concerning symptoms.

## 2018-12-01 NOTE — ED Notes (Signed)
Patient verbalizes understanding of medications and discharge instructions. No further questions at this time. VSS and patient ambulatory at discharge.   

## 2018-12-01 NOTE — ED Triage Notes (Addendum)
Pt took cocaine and complains of CP. Pt stating he felt weak all over. CBG 444 Pt has hx of type 1 diabetes. Pt is alert and oriented. BP 170/100, HR 100

## 2018-12-16 ENCOUNTER — Ambulatory Visit: Payer: Medicaid Other | Admitting: Internal Medicine

## 2018-12-26 ENCOUNTER — Other Ambulatory Visit: Payer: Self-pay

## 2018-12-26 ENCOUNTER — Encounter (HOSPITAL_COMMUNITY): Payer: Self-pay

## 2018-12-26 ENCOUNTER — Ambulatory Visit (HOSPITAL_COMMUNITY)
Admission: EM | Admit: 2018-12-26 | Discharge: 2018-12-26 | Disposition: A | Discharge status: Home / Self Care | Payer: Medicaid Other | Attending: Family Medicine | Admitting: Family Medicine

## 2018-12-26 DIAGNOSIS — Z794 Long term (current) use of insulin: Secondary | ICD-10-CM

## 2018-12-26 DIAGNOSIS — R05 Cough: Secondary | ICD-10-CM

## 2018-12-26 DIAGNOSIS — R059 Cough, unspecified: Secondary | ICD-10-CM

## 2018-12-26 DIAGNOSIS — R1110 Vomiting, unspecified: Secondary | ICD-10-CM

## 2018-12-26 DIAGNOSIS — E1169 Type 2 diabetes mellitus with other specified complication: Secondary | ICD-10-CM

## 2018-12-26 DIAGNOSIS — E1165 Type 2 diabetes mellitus with hyperglycemia: Secondary | ICD-10-CM

## 2018-12-26 DIAGNOSIS — I10 Essential (primary) hypertension: Secondary | ICD-10-CM

## 2018-12-26 LAB — POCT URINALYSIS DIP (DEVICE)
Bilirubin Urine: NEGATIVE
Bilirubin Urine: NEGATIVE
Glucose, UA: 500 mg/dL — AB
Glucose, UA: 500 mg/dL — AB
HGB URINE DIPSTICK: NEGATIVE
Hgb urine dipstick: NEGATIVE
Leukocytes,Ua: NEGATIVE
Leukocytes,Ua: NEGATIVE
Nitrite: NEGATIVE
Nitrite: NEGATIVE
PROTEIN: NEGATIVE mg/dL
Protein, ur: NEGATIVE mg/dL
Specific Gravity, Urine: 1.01 (ref 1.005–1.030)
Specific Gravity, Urine: 1.01 (ref 1.005–1.030)
Urobilinogen, UA: 0.2 mg/dL (ref 0.0–1.0)
Urobilinogen, UA: 0.2 mg/dL (ref 0.0–1.0)
pH: 5.5 (ref 5.0–8.0)
pH: 5.5 (ref 5.0–8.0)

## 2018-12-26 LAB — GLUCOSE, CAPILLARY: GLUCOSE-CAPILLARY: 320 mg/dL — AB (ref 70–99)

## 2018-12-26 MED ORDER — BENZONATATE 200 MG PO CAPS: 200.0000 mg | ORAL_CAPSULE | Freq: Two times a day (BID) | ORAL | 0 refills | Status: DC | PRN

## 2018-12-26 MED ORDER — ONDANSETRON HCL 4 MG PO TABS: 4.0000 mg | ORAL_TABLET | Freq: Four times a day (QID) | ORAL | 0 refills | Status: DC

## 2018-12-26 MED ORDER — ONDANSETRON 4 MG PO TBDP
ORAL_TABLET | ORAL | Status: AC
  Filled 2018-12-26: qty 1

## 2018-12-26 MED ORDER — ONDANSETRON 4 MG PO TBDP
4.0000 mg | ORAL_TABLET | Freq: Once | ORAL | Status: AC
  Administered 2018-12-26: 4 mg via ORAL

## 2018-12-26 NOTE — ED Provider Notes (Signed)
[place]    CSN: [id number] Arrival date & time: 12/26/18  1649     History   Chief Complaint Chief Complaint  Patient presents with  . Emesis  . Shortness of Breath  . Cough    HPI Brent Costa is a 28 y.o. male.   HPI  Patient is here for cough, emesis, illness.  It started suddenly at 6:00 last night.  He has not had any sweats chills or fever.  He states he had some coughing and sensation of shortness of breath.  This has improved as the day went on.  He had a couple spells of vomiting.  He feels like he might be dehydrated.  He is an insulin-dependent diabetic.  He has not been eating and drinking normally today.  No dizziness or lightheadedness.  No headache.  He does have some abdominal cramping with the vomiting.  He had a normal brown bowel movement yesterday.  None today.  No diarrhea. He states that there are other people sick at work with viruses. No known exposure to coronavirus.  No recent travel.   Past Medical History:  Diagnosis Date  . Chronic bronchitis ([place])    "get it about q year" (11/20/2013)  . Chronic bronchitis ([place])   . Chronic pancreatitis ([place]) 10/2015   likely present 11/2013 but no lipase at that time.  due to elevated triglycerides and ETOH  . GERD (gastroesophageal reflux disease)   . Hepatic steatosis 11/2013   seen on CT, LFTs normal.  . Hypertension   . Hypertriglyceridemia   . Microalbuminuria   . Molluscum contagiosum 06/2015   upper extremities.   . Obesity   . Type II diabetes mellitus ([place]) dx'd 11/2013   oral agents and insulin    Patient Active Problem List   Diagnosis Date Noted  . Moderate episode of recurrent major depressive disorder ([place]) 11/22/2017  . Microalbuminuria due to type 2 diabetes mellitus ([place]) 11/22/2017  . Tobacco dependence 03/11/2017  . Positive depression screening 03/11/2017  . Abnormal CT of the abdomen   . Gastroparesis   . Obesity (BMI 30-39.9) 11/15/2015  . Chronic pancreatitis  ([place]) 11/13/2015  . 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 Essential hypertension 10/31/2015  . Gastroesophageal reflux disease without esophagitis 10/31/2015  . Molluscum contagiosum 10/16/2015  . Hypertriglyceridemia 06/25/2015  . Type 2 diabetes mellitus with diabetic neuropathy, unspecified ([place]) 01/11/2014    Past Surgical History:  Procedure Laterality Date  . ESOPHAGOGASTRODUODENOSCOPY N/A 11/21/2013   Procedure: ESOPHAGOGASTRODUODENOSCOPY (EGD);  Surgeon: Wonda Horner, MD;  Location: [facility] ENDOSCOPY;  Service: Endoscopy;  Laterality: N/A;  . TONSILLECTOMY AND ADENOIDECTOMY  ~ 1999       Home Medications    Prior to Admission medications   Medication Sig Start Date End Date Taking? Authorizing Provider  benzonatate (TESSALON) 200 MG capsule Take 1 capsule (200 mg total) by mouth 2 (two) times daily as needed for cough. 12/26/18   Raylene Everts, MD  Blood Glucose Monitoring Suppl (TRUE METRIX METER) w/Device KIT Use as directed 11/22/17   Ladell Pier, MD  glucose blood (TRUE METRIX BLOOD GLUCOSE TEST) test strip Use as instructed 11/22/17   Ladell Pier, MD  ibuprofen (ADVIL,MOTRIN) 200 MG tablet Take 400 mg by mouth every 6 (six) hours as needed for headache (pain).    [provider]  insulin aspart (NOVOLOG) 100 UNIT/ML injection Inject 5-10 Units into the skin See admin instructions. Inject 5-10 units subcutaneously three times daily after  meals - CBG <200 5 units, >200 10 units    [provider]  Insulin Glargine (LANTUS) 100 UNIT/ML Solostar Pen Inject 20 Units into the skin daily at 10 pm. Patient taking differently: Inject 15 Units into the skin at bedtime.  12/20/17   Ladell Pier, MD  Insulin Pen Needle (PEN NEEDLES) 31G X 5 MM MISC 100 g by Does not apply route 2 (two) times daily. 12/20/17   Ladell Pier, MD  Insulin Syringes, Disposable, U-100 1 ML MISC Use as directed with insulin 03/11/17   Ladell Pier, MD  Lancets MISC  Use as directed 11/22/17   Ladell Pier, MD  metFORMIN (GLUCOPHAGE) 500 MG tablet Take 1 tablet (500 mg total) by mouth 2 (two) times daily with a meal. Patient taking differently: Take 250 mg by mouth daily as needed (CBG >200).  11/22/17   Ladell Pier, MD  ondansetron (ZOFRAN) 4 MG tablet Take 1 tablet (4 mg total) by mouth every 6 (six) hours. 12/26/18   Raylene Everts, MD  ranitidine (RANITIDINE 150 MAX STRENGTH) 150 MG tablet Take 1 tablet (150 mg total) by mouth 2 (two) times daily. Patient taking differently: Take 150 mg by mouth 2 (two) times daily as needed for heartburn (acid reflux).  12/20/17   Ladell Pier, MD    Family History Family History  Problem Relation Age of Onset  . Diabetes Mellitus II Father   . Heart failure Father   . Asthma Mother   . Diabetes Mellitus I Sister     Social History Social History   Tobacco Use  . Smoking status: Former Smoker    Packs/day: 1.00    Years: 3.00    Pack years: 3.00    Types: Cigarettes    Last attempt to quit: 12/11/2013    Years since quitting: 5.0  . Smokeless tobacco: Never Used  Substance Use Topics  . Alcohol use: Yes    Comment: 11/27/2015 "might have a beer once/month"  . Drug use: Yes    Frequency: 2.0 times per week    Types: Marijuana    Comment: 11/27/2015 "used to use marijuana; used it last  in ~ 2014"     Allergies   Patient has no known allergies.   Review of Systems Review of Systems  Constitutional: Negative for chills and fever.  HENT: Positive for congestion. Negative for ear pain and sore throat.   Eyes: Negative for pain and visual disturbance.  Respiratory: Positive for cough. Negative for shortness of breath.   Cardiovascular: Negative for chest pain and palpitations.  Gastrointestinal: Positive for nausea and vomiting. Negative for abdominal pain.  Genitourinary: Negative for dysuria and hematuria.  Musculoskeletal: Negative for arthralgias and back pain.  Skin:  Negative for color change and rash.  Neurological: Negative for seizures and syncope.  All other systems reviewed and are negative.    Physical Exam Triage Vital Signs ED Triage Vitals  Enc Vitals Group     BP 12/26/18 1724 (!) 151/79     Pulse Rate 12/26/18 1724 97     Resp 12/26/18 1724 20     Temp 12/26/18 1724 98.8 F (37.1 C)     Temp Source 12/26/18 1724 Oral     SpO2 12/26/18 1724 100 %     Weight 12/26/18 1722 238 lb (108 kg)     Height 12/26/18 1722 '5\' 10"'  (1.778 m)     Head Circumference --  Peak Flow --      Pain Score 12/26/18 1721 7     Pain Loc --      Pain Edu? --      Excl. in [place]? --    No data found.  Updated Vital Signs BP (!) 151/79 (BP Location: Right Arm)   Pulse 97   Temp 98.8 F (37.1 C) (Oral)   Resp 20   Ht '5\' 10"'  (1.778 m)   Wt 108 kg   SpO2 100%   BMI 34.15 kg/m      Physical Exam Constitutional:      General: He is not in acute distress.    Appearance: He is well-developed. He is ill-appearing.     Comments: Appears tired  HENT:     Head: Normocephalic and atraumatic.     Mouth/Throat:     Mouth: Mucous membranes are moist.  Eyes:     Conjunctiva/sclera: Conjunctivae normal.     Pupils: Pupils are equal, round, and reactive to light.  Neck:     Musculoskeletal: Normal range of motion and neck supple.  Cardiovascular:     Rate and Rhythm: Normal rate and regular rhythm.     Heart sounds: Normal heart sounds.  Pulmonary:     Effort: Pulmonary effort is normal. No respiratory distress.     Breath sounds: No decreased breath sounds, wheezing or rhonchi.  Chest:     Chest wall: No tenderness.  Abdominal:     General: Bowel sounds are normal. There is no distension.     Palpations: Abdomen is soft. There is no hepatomegaly or splenomegaly.     Tenderness: There is abdominal tenderness. There is no guarding or rebound.     Comments: Mild diffuse tenderness  Musculoskeletal: Normal range of motion.     Right lower leg: No  edema.     Left lower leg: No edema.  Skin:    General: Skin is warm and dry.  Neurological:     General: No focal deficit present.     Mental Status: He is alert.  Psychiatric:        Mood and Affect: Mood normal.        Behavior: Behavior normal.      UC Treatments / Results  Labs (all labs ordered are listed, but only abnormal results are displayed) Labs Reviewed  GLUCOSE, CAPILLARY - Abnormal; Notable for the following components:      Result Value   Glucose-Capillary 320 (*)    All other components within normal limits  POCT URINALYSIS DIP (DEVICE) - Abnormal; Notable for the following components:   Glucose, UA 500 (*)    Ketones, ur TRACE (*)    All other components within normal limits  POCT URINALYSIS DIP (DEVICE) - Abnormal; Notable for the following components:   Glucose, UA 500 (*)    Ketones, ur TRACE (*)    All other components within normal limits  CBG MONITORING, ED    EKG None  Radiology No results found.  Procedures Procedures (including critical care time)  Medications Ordered in UC Medications  ondansetron (ZOFRAN-ODT) disintegrating tablet 4 mg (4 mg Oral Given 12/26/18 1815)    Initial Impression / Assessment and Plan / UC Course  I have reviewed the triage vital signs and the nursing notes.  Pertinent labs & imaging results that were available during my care of the patient were reviewed by me and considered in my medical decision making (see chart for details).  Patient does not appear to be dehydrated.  After the Zofran his abdominal pain went away.  I feel like he can be treated at home. We did review that with coronavirus in the community, his symptoms of cough and shortness of breath will criteria for coronavirus quarantine.  He needs to be out of work for at least a week.  He is given rotavirus information from Department of Health and [employer]. Final Clinical Impressions(s) / UC Diagnoses   Final diagnoses:  Non-intractable  vomiting, presence of nausea not specified, unspecified vomiting type  Type 2 diabetes mellitus with other specified complication, with long-term current use of insulin (HCC)  Cough     Discharge Instructions     Drink plenty of fluids Take Zofran as needed for nausea and vomiting As soon as you are able start a bland diet Avoid spicy foods and fried foods You may use Tessalon as needed for coughing You need to stay out of work for at least a week Please read the coronavirus Covid19 instruction sheet about quarantine If you start feeling worse please call your primary care doctor for additional instructions, or call the [place] hotline   ED Prescriptions    Medication Sig Dispense Auth. Provider   ondansetron (ZOFRAN) 4 MG tablet Take 1 tablet (4 mg total) by mouth every 6 (six) hours. 12 tablet Raylene Everts, MD   benzonatate (TESSALON) 200 MG capsule Take 1 capsule (200 mg total) by mouth 2 (two) times daily as needed for cough. 20 capsule Raylene Everts, MD     Controlled Substance Prescriptions [place] Controlled Substance Registry consulted? Not Applicable   Raylene Everts, MD 12/26/18 [place]

## 2018-12-26 NOTE — Discharge Instructions (Signed)
Drink plenty of fluids Take Zofran as needed for nausea and vomiting As soon as you are able start a bland diet Avoid spicy foods and fried foods You may use Tessalon as needed for coughing You need to stay out of work for at least a week Please read the coronavirus Covid19 instruction sheet about quarantine If you start feeling worse please call your primary care doctor for additional instructions, or call the COVID hotline

## 2018-12-26 NOTE — ED Triage Notes (Signed)
Patient presents to Urgent Care with complaints of cough, one episode of vomiting, and sore throat since last night. Patient states he works at [employer] and all of a sudden began feeling sick, has been around others who have been sick, has not traveled outside of the state.

## 2019-01-11 ENCOUNTER — Other Ambulatory Visit: Payer: Self-pay

## 2019-01-11 ENCOUNTER — Encounter (HOSPITAL_COMMUNITY): Payer: Self-pay

## 2019-01-11 ENCOUNTER — Ambulatory Visit (HOSPITAL_COMMUNITY)
Admission: EM | Admit: 2019-01-11 | Discharge: 2019-01-11 | Disposition: A | Discharge status: Home / Self Care | Payer: Medicaid Other | Attending: Family Medicine | Admitting: Family Medicine

## 2019-01-11 DIAGNOSIS — R05 Cough: Secondary | ICD-10-CM

## 2019-01-11 DIAGNOSIS — R059 Cough, unspecified: Secondary | ICD-10-CM

## 2019-01-11 DIAGNOSIS — J9801 Acute bronchospasm: Secondary | ICD-10-CM

## 2019-01-11 MED ORDER — ALBUTEROL SULFATE HFA 108 (90 BASE) MCG/ACT IN AERS: 1.0000 | INHALATION_SPRAY | Freq: Four times a day (QID) | RESPIRATORY_TRACT | 0 refills | Status: AC | PRN

## 2019-01-11 NOTE — ED Notes (Signed)
Patient verbalizes understanding of discharge instructions. Opportunity for questioning and answers were provided. Patient discharged from UCC by MD. 

## 2019-01-11 NOTE — ED Triage Notes (Signed)
Patient presents to Urgent Care with complaints of cough, recurrent since last night (had resolved since last visit and restarted last night while at work). Patient states he was seen here previously and was given a work note for a week, last night was his first night back. Would like another work note.

## 2019-01-11 NOTE — Discharge Instructions (Signed)
Caring for yourself: Get plenty of rest. Drink plenty of fluids, enough so that your urine is light yellow or clear like water. If you have kidney, heart, or liver disease and have to limit fluids, talk with your doctor before you increase the amount of fluids you drink. Take an over-the-counter pain medicine if needed, such as acetaminophen (Tylenol), ibuprofen (Advil, Motrin), or naproxen (Aleve), to relieve fever, headache, and muscle aches. Read and follow all instructions on the label. No one younger than 20 should take aspirin. It has been linked to Reye syndrome, a serious illness. Before you use over the counter cough and cold medicines, check the label. These medicines may not be safe for children younger than age 6 or for people with certain health problems. If the skin around your nose and lips becomes sore, put some petroleum jelly on the area.  Avoid spreading a respiratory virus: Wash your hands regularly, and keep your hands away from your face.  Stay home from school, work, and other public places until you are feeling better and your fever has been gone for at least 24 hours. The fever needs to have gone away on its own without the help of medicine.  

## 2019-01-25 NOTE — ED Provider Notes (Signed)
[facility]   [id number] 01/11/19 Arrival Time: 1907  ASSESSMENT & PLAN:  1. Cough   2. Bronchospasm, acute    Meds ordered this encounter  Medications  . albuterol (PROVENTIL HFA;VENTOLIN HFA) 108 (90 Base) MCG/ACT inhaler    Sig: Inhale 1-2 puffs into the lungs every 6 (six) hours as needed for wheezing or shortness of breath.    Dispense:  1 Inhaler    Refill:  0   Work note provided. OTC symptom care as needed. Ensure adequate fluid intake and rest. May f/u with PCP or here as needed.  Reviewed expectations re: course of current medical issues. Questions answered. Outlined signs and symptoms indicating need for more acute intervention. Patient verbalized understanding. After Visit Summary given.   SUBJECTIVE: History from: patient.  Brent Costa is a 28 y.o. male who was seen recently for URI symptoms. Feeling a bit better but tried to go to work and ended up coughing "non-stop". Questions mild wheezing. No SOB. Afebrile. Overall normal PO intake without n/v. Known sick contacts: no. No specific or significant aggravating or alleviating factors reported. Ambulatory without difficulty. No LE edema. OTC treatment: none reported.   Social History   Tobacco Use  Smoking Status Former Smoker  . Packs/day: 1.00  . Years: 3.00  . Pack years: 3.00  . Types: Cigarettes  . Last attempt to quit: 12/11/2013  . Years since quitting: 5.1  Smokeless Tobacco Never Used    ROS: As per HPI. All other systems negative.    OBJECTIVE:  Vitals:   01/11/19 1922  BP: (!) 155/81  Pulse: 93  Resp: 17  Temp: 98.7 F (37.1 C)  TempSrc: Oral  SpO2: 100%     General appearance: alert; appears fatigued HEENT: nasal congestion; clear runny nose; throat irritation secondary to post-nasal drainage Neck: supple without LAD CV: RRR Lungs: unlabored respirations, symmetrical air entry with mild bilateral expiratory wheezing; cough: mild and dry Abd: soft Ext: no LE  edema Skin: warm and dry Psychological: alert and cooperative; normal mood and affect   No Known Allergies  Past Medical History:  Diagnosis Date  . Chronic bronchitis (H[facility])    "get it about q year" (11/20/2013)  . Chronic bronchitis ([facility])   . Chronic pancreatitis ([facility]) 10/2015   likely present 11/2013 but no lipase at that time.  due to elevated triglycerides and ETOH  . GERD (gastroesophageal reflux disease)   . Hepatic steatosis 11/2013   seen on CT, LFTs normal.  . Hypertension   . Hypertriglyceridemia   . Microalbuminuria   . Molluscum contagiosum 06/2015   upper extremities.   . Obesity   . Type II diabetes mellitus ([facility]) dx'd 11/2013   oral agents and insulin   Family History  Problem Relation Age of Onset  . Diabetes Mellitus II Father   . Heart failure Father   . Asthma Mother   . Diabetes Mellitus I Sister    Social History   Socioeconomic History  . Marital status: Single    Spouse name: Not on file  . Number of children: Not on file  . Years of education: Not on file  . Highest education level: Not on file  Occupational History  . Not on file  Social Needs  . Financial resource strain: Not on file  . Food insecurity:    Worry: Not on file    Inability: Not on file  . Transportation needs:    Medical: Not on file  Non-medical: Not on file  Tobacco Use  . Smoking status: Former Smoker    Packs/day: 1.00    Years: 3.00    Pack years: 3.00    Types: Cigarettes    Last attempt to quit: 12/11/2013    Years since quitting: 5.1  . Smokeless tobacco: Never Used  Substance and Sexual Activity  . Alcohol use: Yes    Comment: 11/27/2015 "might have a beer once/month"  . Drug use: Yes    Frequency: 2.0 times per week    Types: Marijuana    Comment: 11/27/2015 "used to use marijuana; used it last  in ~ 2014"  . Sexual activity: Yes    Birth control/protection: Condom  Lifestyle  . Physical activity:    Days per week: Not on file    Minutes per session:  Not on file  . Stress: Not on file  Relationships  . Social connections:    Talks on phone: Not on file    Gets together: Not on file    Attends religious service: Not on file    Active member of club or organization: Not on file    Attends meetings of clubs or organizations: Not on file    Relationship status: Not on file  . Intimate partner violence:    Fear of current or ex partner: Not on file    Emotionally abused: Not on file    Physically abused: Not on file    Forced sexual activity: Not on file  Other Topics Concern  . Not on file  Social History Narrative  . Not on file           Mardella LaymanHagler, Brent Gelin, MD 01/25/19 0900

## 2019-01-31 ENCOUNTER — Ambulatory Visit (HOSPITAL_COMMUNITY)
Admission: EM | Admit: 2019-01-31 | Discharge: 2019-01-31 | Disposition: A | Discharge status: Home / Self Care | Payer: Self-pay | Attending: Family Medicine | Admitting: Family Medicine

## 2019-01-31 ENCOUNTER — Ambulatory Visit (INDEPENDENT_AMBULATORY_CARE_PROVIDER_SITE_OTHER): Payer: Self-pay

## 2019-01-31 ENCOUNTER — Other Ambulatory Visit: Payer: Self-pay

## 2019-01-31 DIAGNOSIS — J22 Unspecified acute lower respiratory infection: Secondary | ICD-10-CM

## 2019-01-31 LAB — POCT RAPID STREP A: Streptococcus, Group A Screen (Direct): NEGATIVE

## 2019-01-31 LAB — GLUCOSE, CAPILLARY: Glucose-Capillary: 323 mg/dL — ABNORMAL HIGH (ref 70–99)

## 2019-01-31 MED ORDER — BENZONATATE 200 MG PO CAPS: 200.0000 mg | ORAL_CAPSULE | Freq: Three times a day (TID) | ORAL | 0 refills | Status: AC | PRN

## 2019-01-31 MED ORDER — AZITHROMYCIN 250 MG PO TABS: 250.0000 mg | ORAL_TABLET | Freq: Every day | ORAL | 0 refills | Status: DC

## 2019-01-31 NOTE — ED Triage Notes (Signed)
Pt has c/o headache and cough for 2 weeks, also states abdominal pain that began yesterday

## 2019-01-31 NOTE — Discharge Instructions (Signed)
Begin Azithromycin Tessalon as needed for cough Monitor blood sugars  Please follow-up in the emergency room if having persistent fevers, worsening shortness of breath, fatigue, dizziness, lightheadedness, vomiting, worsening abdominal pain

## 2019-02-01 NOTE — ED Provider Notes (Signed)
[facility]    CSN: [id number] Arrival date & time: 01/31/19  1927     History   Chief Complaint Chief Complaint  Patient presents with  . Headache  . Cough    HPI Brent Costa is a 28 y.o. male history of substance use, DMT2, HTN, pancreatitis, presenting today for evaluation of cough. Patient notes that he has had a cough for the past 3-4 weeks. He denies chest pain or shortness of breath. Mild sore throat. Minimal congestion. Typically smokes 1 pack/day. He has also had a headache to bilateral temporal and feels sore. Began to have mild abdominal pain today. Denies nausea and vomiting, but has had decreased appetite. Associates this to moving today as it feels sore. Denies changes in bowel movements. Maintaining oral intake. On insulin for DM but does not check his blood sugars. Denies close contacts with similar symptoms. Denies fevers at home. Has tried aleeve and nyquil. Denies recreational drug use recently.  HPI  Past Medical History:  Diagnosis Date  . Chronic bronchitis ([place])    "get it about q year" (11/20/2013)  . Chronic bronchitis ([place])   . Chronic pancreatitis ([place]) 10/2015   likely present 11/2013 but no lipase at that time.  due to elevated triglycerides and ETOH  . GERD (gastroesophageal reflux disease)   . Hepatic steatosis 11/2013   seen on CT, LFTs normal.  . Hypertension   . Hypertriglyceridemia   . Microalbuminuria   . Molluscum contagiosum 06/2015   upper extremities.   . Obesity   . Type II diabetes mellitus ([place]) dx'd 11/2013   oral agents and insulin    Patient Active Problem List   Diagnosis Date Noted  . Moderate episode of recurrent major depressive disorder ([place]) 11/22/2017  . Microalbuminuria due to type 2 diabetes mellitus ([place]) 11/22/2017  . Tobacco dependence 03/11/2017  . Positive depression screening 03/11/2017  . Abnormal CT of the abdomen   . Gastroparesis   . Obesity (BMI 30-39.9) 11/15/2015  . Chronic pancreatitis  ([place]) 11/13/2015  . 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 Essential hypertension 10/31/2015  . Gastroesophageal reflux disease without esophagitis 10/31/2015  . Molluscum contagiosum 10/16/2015  . Hypertriglyceridemia 06/25/2015  . Type 2 diabetes mellitus with diabetic neuropathy, unspecified ([place]) 01/11/2014    Past Surgical History:  Procedure Laterality Date  . ESOPHAGOGASTRODUODENOSCOPY N/A 11/21/2013   Procedure: ESOPHAGOGASTRODUODENOSCOPY (EGD);  Surgeon: Wonda Horner, MD;  Location: [facility] ENDOSCOPY;  Service: Endoscopy;  Laterality: N/A;  . TONSILLECTOMY AND ADENOIDECTOMY  ~ 1999       Home Medications    Prior to Admission medications   Medication Sig Start Date End Date Taking? Authorizing Provider  albuterol (PROVENTIL HFA;VENTOLIN HFA) 108 (90 Base) MCG/ACT inhaler Inhale 1-2 puffs into the lungs every 6 (six) hours as needed for wheezing or shortness of breath. 01/11/19  Yes Hagler, Aaron Edelman, MD  azithromycin (ZITHROMAX) 250 MG tablet Take 1 tablet (250 mg total) by mouth daily. Take first 2 tablets together, then 1 every day until finished. 01/31/19   Wieters, Hallie C, PA-C  benzonatate (TESSALON) 200 MG capsule Take 1 capsule (200 mg total) by mouth 3 (three) times daily as needed for up to 7 days for cough. 01/31/19 02/07/19  Wieters, Hallie C, PA-C  Blood Glucose Monitoring Suppl (TRUE METRIX METER) w/Device KIT Use as directed 11/22/17   Ladell Pier, MD  glucose blood (TRUE METRIX BLOOD GLUCOSE TEST) test strip Use as instructed 11/22/17   Ladell Pier, MD  ibuprofen (ADVIL,MOTRIN) 200 MG tablet Take 400 mg by mouth every 6 (six) hours as needed for headache (pain).    [provider]  insulin aspart (NOVOLOG) 100 UNIT/ML injection Inject 5-10 Units into the skin See admin instructions. Inject 5-10 units subcutaneously three times daily after meals - CBG <200 5 units, >200 10 units    [provider]  Insulin Glargine (LANTUS) 100 UNIT/ML Solostar Pen  Inject 20 Units into the skin daily at 10 pm. Patient taking differently: Inject 15 Units into the skin at bedtime.  12/20/17   Ladell Pier, MD  Insulin Pen Needle (PEN NEEDLES) 31G X 5 MM MISC 100 g by Does not apply route 2 (two) times daily. 12/20/17   Ladell Pier, MD  Insulin Syringes, Disposable, U-100 1 ML MISC Use as directed with insulin 03/11/17   Ladell Pier, MD  Lancets MISC Use as directed 11/22/17   Ladell Pier, MD  ondansetron (ZOFRAN) 4 MG tablet Take 1 tablet (4 mg total) by mouth every 6 (six) hours. 12/26/18   Raylene Everts, MD  ranitidine (RANITIDINE 150 MAX STRENGTH) 150 MG tablet Take 1 tablet (150 mg total) by mouth 2 (two) times daily. Patient taking differently: Take 150 mg by mouth 2 (two) times daily as needed for heartburn (acid reflux).  12/20/17   Ladell Pier, MD    Family History Family History  Problem Relation Age of Onset  . Diabetes Mellitus II Father   . Heart failure Father   . Asthma Mother   . Diabetes Mellitus I Sister     Social History Social History   Tobacco Use  . Smoking status: Former Smoker    Packs/day: 1.00    Years: 3.00    Pack years: 3.00    Types: Cigarettes    Last attempt to quit: 12/11/2013    Years since quitting: 5.1  . Smokeless tobacco: Never Used  Substance Use Topics  . Alcohol use: Yes    Comment: 11/27/2015 "might have a beer once/month"  . Drug use: Yes    Frequency: 2.0 times per week    Types: Marijuana    Comment: 11/27/2015 "used to use marijuana; used it last  in ~ 2014"     Allergies   Patient has no known allergies.   Review of Systems Review of Systems  Constitutional: Positive for fatigue. Negative for activity change, appetite change, chills and fever.  HENT: Positive for sore throat. Negative for congestion, ear pain, rhinorrhea, sinus pressure and trouble swallowing.   Eyes: Negative for discharge and redness.  Respiratory: Positive for cough. Negative for chest  tightness and shortness of breath.   Cardiovascular: Negative for chest pain.  Gastrointestinal: Positive for abdominal pain. Negative for diarrhea, nausea and vomiting.  Musculoskeletal: Negative for myalgias.  Skin: Negative for rash.  Neurological: Positive for headaches. Negative for dizziness and light-headedness.     Physical Exam Triage Vital Signs ED Triage Vitals  Enc Vitals Group     BP 01/31/19 1957 (!) 147/85     Pulse Rate 01/31/19 1957 (!) 111     Resp 01/31/19 1957 18     Temp 01/31/19 1957 99.6 F (37.6 C)     Temp src --      SpO2 01/31/19 1957 99 %     Weight --      Height --      Head Circumference --      Peak Flow --  Pain Score 01/31/19 1959 7     Pain Loc --      Pain Edu? --      Excl. in [place]? --    No data found.  Updated Vital Signs BP (!) 147/85   Pulse (!) 111   Temp 99.6 F (37.6 C)   Resp 18   SpO2 99%   HR rechecked ranging from 110-115  Visual Acuity Right Eye Distance:   Left Eye Distance:   Bilateral Distance:    Right Eye Near:   Left Eye Near:    Bilateral Near:     Physical Exam Vitals signs and nursing note reviewed.  Constitutional:      Appearance: He is well-developed.     Comments: Appears slightly diaphoretic- he notes this is normal for him  HENT:     Head: Normocephalic and atraumatic.     Ears:     Comments: Bilateral ears without tenderness to palpation of external auricle, tragus and mastoid, EAC's without erythema or swelling, TM's with good bony landmarks and cone of light. Non erythematous.     Mouth/Throat:     Comments: Oral mucosa pink and moist, no tonsillar enlargement or exudate. Posterior pharynx patent and erythematous, no uvula deviation or swelling. Normal phonation. Eyes:     Extraocular Movements: Extraocular movements intact.     Conjunctiva/sclera: Conjunctivae normal.     Pupils: Pupils are equal, round, and reactive to light.     Comments: Periorbital area appears slightly swollen,  but non erythematous or tender  Neck:     Musculoskeletal: Neck supple.  Cardiovascular:     Rate and Rhythm: Normal rate and regular rhythm.     Heart sounds: No murmur.  Pulmonary:     Effort: Pulmonary effort is normal. No respiratory distress.     Breath sounds: Normal breath sounds.     Comments: Breathing comfortably at rest, CTABL, no wheezing, rales or other adventitious sounds auscultated Abdominal:     Palpations: Abdomen is soft.     Tenderness: There is no abdominal tenderness.     Comments: nontender to light and deep palpation throughout abdomen, negative rebound  Skin:    General: Skin is warm and dry.  Neurological:     General: No focal deficit present.     Mental Status: He is alert and oriented to person, place, and time. Mental status is at baseline.     Cranial Nerves: No cranial nerve deficit.     Motor: No weakness.     Comments: Speaking appropriately, ambulating independently      UC Treatments / Results  Labs (all labs ordered are listed, but only abnormal results are displayed) Labs Reviewed  GLUCOSE, CAPILLARY - Abnormal; Notable for the following components:      Result Value   Glucose-Capillary 323 (*)    All other components within normal limits  CULTURE, GROUP A STREP ([place])  CBG MONITORING, ED  POCT RAPID STREP A    EKG None  Radiology Dg Chest 2 View  Result Date: 01/31/2019 CLINICAL DATA:  Constant cough for 3-4 weeks, shortness of breath, history diabetes mellitus, smoker EXAM: CHEST - 2 VIEW COMPARISON:  12/01/2018 FINDINGS: Normal heart size, mediastinal contours, and pulmonary vascularity. Mild peribronchial thickening. No pulmonary infiltrate, pleural effusion, or pneumothorax. Bones unremarkable. IMPRESSION: Mild bronchitic changes without infiltrate. Electronically Signed   By: Lavonia Dana M.D.   On: 01/31/2019 20:45    Procedures Procedures (including critical care time)  Medications Ordered  in UC Medications - No data to  display  Initial Impression / Assessment and Plan / UC Course  I have reviewed the triage vital signs and the nursing notes.  Pertinent labs & imaging results that were available during my care of the patient were reviewed by me and considered in my medical decision making (see chart for details).     Blood sugar 323, do not suspect DKA/HHOS. Low grade fever with cough x 3-4 weeks. CXR negative. Will still cover atypicals given length of symptoms with azithromycin. No neuro deficits. Tylenol for headache/fevers. Tessalon for cough. Possible COVID, recommended staying home. Follow up in ED if symptoms persisting or worsening for further workup. Discussed strict return precautions. Patient verbalized understanding and is agreeable with plan.  Final Clinical Impressions(s) / UC Diagnoses   Final diagnoses:  Lower respiratory infection (e.g., bronchitis, pneumonia, pneumonitis, pulmonitis)     Discharge Instructions     Begin Azithromycin Tessalon as needed for cough Monitor blood sugars  Please follow-up in the emergency room if having persistent fevers, worsening shortness of breath, fatigue, dizziness, lightheadedness, vomiting, worsening abdominal pain   ED Prescriptions    Medication Sig Dispense Auth. Provider   benzonatate (TESSALON) 200 MG capsule Take 1 capsule (200 mg total) by mouth 3 (three) times daily as needed for up to 7 days for cough. 28 capsule Wieters, Hallie C, PA-C   azithromycin (ZITHROMAX) 250 MG tablet Take 1 tablet (250 mg total) by mouth daily. Take first 2 tablets together, then 1 every day until finished. 6 tablet Wieters, [place] C, PA-C     Controlled Substance Prescriptions [place] Controlled Substance Registry consulted? Not Applicable   Janith Lima, [place] 02/01/19 1057

## 2019-02-03 LAB — CULTURE, GROUP A STREP (THRC)

## 2019-05-10 ENCOUNTER — Other Ambulatory Visit: Payer: Self-pay

## 2019-05-10 ENCOUNTER — Encounter (HOSPITAL_COMMUNITY): Payer: Self-pay | Admitting: Emergency Medicine

## 2019-05-10 ENCOUNTER — Emergency Department (HOSPITAL_COMMUNITY)
Admission: EM | Admit: 2019-05-10 | Discharge: 2019-05-10 | Disposition: A | Discharge status: Home / Self Care | Payer: Self-pay | Attending: Emergency Medicine | Admitting: Emergency Medicine

## 2019-05-10 ENCOUNTER — Emergency Department (HOSPITAL_COMMUNITY): Payer: Self-pay

## 2019-05-10 DIAGNOSIS — R0789 Other chest pain: Secondary | ICD-10-CM | POA: Insufficient documentation

## 2019-05-10 DIAGNOSIS — E1140 Type 2 diabetes mellitus with diabetic neuropathy, unspecified: Secondary | ICD-10-CM | POA: Insufficient documentation

## 2019-05-10 DIAGNOSIS — Z79899 Other long term (current) drug therapy: Secondary | ICD-10-CM | POA: Insufficient documentation

## 2019-05-10 DIAGNOSIS — Z794 Long term (current) use of insulin: Secondary | ICD-10-CM | POA: Insufficient documentation

## 2019-05-10 DIAGNOSIS — Z87891 Personal history of nicotine dependence: Secondary | ICD-10-CM | POA: Insufficient documentation

## 2019-05-10 DIAGNOSIS — I10 Essential (primary) hypertension: Secondary | ICD-10-CM | POA: Insufficient documentation

## 2019-05-10 LAB — BASIC METABOLIC PANEL
Anion gap: 14 (ref 5–15)
BUN: 10 mg/dL (ref 6–20)
CO2: 21 mmol/L — ABNORMAL LOW (ref 22–32)
Calcium: 9.3 mg/dL (ref 8.9–10.3)
Chloride: 102 mmol/L (ref 98–111)
Creatinine, Ser: 0.74 mg/dL (ref 0.61–1.24)
GFR calc Af Amer: >60 mL/min (ref >60)
GFR calc non Af Amer: >60 mL/min (ref >60)
Glucose, Bld: 271 mg/dL — ABNORMAL HIGH (ref 70–99)
Potassium: 3.5 mmol/L (ref 3.5–5.1)
Sodium: 137 mmol/L (ref 135–145)

## 2019-05-10 LAB — TROPONIN I (HIGH SENSITIVITY)
Troponin I (High Sensitivity): 3 ng/L (ref <18)
Troponin I (High Sensitivity): 3 ng/L (ref <18)

## 2019-05-10 LAB — CBC WITH DIFFERENTIAL/PLATELET
Abs Immature Granulocytes: 0.01 10*3/uL (ref 0.00–0.07)
Basophils Absolute: 0 10*3/uL (ref 0.0–0.1)
Basophils Relative: 0 %
Eosinophils Absolute: 0.1 10*3/uL (ref 0.0–0.5)
Eosinophils Relative: 1 %
HCT: 44.8 % (ref 39.0–52.0)
Hemoglobin: 15.4 g/dL (ref 13.0–17.0)
Immature Granulocytes: 0 %
Lymphocytes Relative: 36 %
Lymphs Abs: 2.1 10*3/uL (ref 0.7–4.0)
MCH: 28.6 pg (ref 26.0–34.0)
MCHC: 34.4 g/dL (ref 30.0–36.0)
MCV: 83.3 fL (ref 80.0–100.0)
Monocytes Absolute: 0.3 10*3/uL (ref 0.1–1.0)
Monocytes Relative: 6 %
Neutro Abs: 3.3 10*3/uL (ref 1.7–7.7)
Neutrophils Relative %: 57 %
Platelets: 230 10*3/uL (ref 150–400)
RBC: 5.38 MIL/uL (ref 4.22–5.81)
RDW: 12.7 % (ref 11.5–15.5)
WBC: 5.8 10*3/uL (ref 4.0–10.5)
nRBC: 0 % (ref 0.0–0.2)

## 2019-05-10 LAB — D-DIMER, QUANTITATIVE: D-Dimer, Quant: <0.27 ug/mL-FEU (ref 0.00–0.50)

## 2019-05-10 MED ORDER — KETOROLAC TROMETHAMINE 30 MG/ML IJ SOLN
30.0000 mg | Freq: Once | INTRAMUSCULAR | Status: AC
  Administered 2019-05-10: 05:00:00 30 mg via INTRAVENOUS
  Filled 2019-05-10: qty 1

## 2019-05-10 MED ORDER — ACETAMINOPHEN 325 MG PO TABS
650.0000 mg | ORAL_TABLET | Freq: Once | ORAL | Status: AC
  Administered 2019-05-10: 650 mg via ORAL
  Filled 2019-05-10: qty 2

## 2019-05-10 NOTE — ED Provider Notes (Signed)
[facility] EMERGENCY DEPARTMENT Provider Note   CSN: [id number] Arrival date & time: 05/10/19  8250    History   Chief Complaint Chief Complaint  Patient presents with  . Chest Pain    HPI Brent Costa is a 28 y.o. male.   The history is provided by the patient.  Chest Pain He has history of hypertension, diabetes, hyperlipidemia, obesity, GERD, chronic pancreatitis and comes in because of chest pain.  He states he woke up with sharp pain in the lower sternal area without radiation.  Pain is worse with taking a deep breath.  He rates the pain at 7/10.  There is associated dyspnea and diaphoresis but no nausea.  He denies any cough or fever.  He denies any exposure to COVID-19.  He does smoke cigarettes, admitting to half pack a day.  He denies cocaine use.  He has not done anything to treat his symptoms at home.  Past Medical History:  Diagnosis Date  . Chronic bronchitis ([place])    "get it about q year" (11/20/2013)  . Chronic bronchitis ([place])   . Chronic pancreatitis ([place]) 10/2015   likely present 11/2013 but no lipase at that time.  due to elevated triglycerides and ETOH  . GERD (gastroesophageal reflux disease)   . Hepatic steatosis 11/2013   seen on CT, LFTs normal.  . Hypertension   . Hypertriglyceridemia   . Microalbuminuria   . Molluscum contagiosum 06/2015   upper extremities.   . Obesity   . Type II diabetes mellitus ([place]) dx'd 11/2013   oral agents and insulin    Patient Active Problem List   Diagnosis Date Noted  . Moderate episode of recurrent major depressive disorder ([place]) 11/22/2017  . Microalbuminuria due to type 2 diabetes mellitus ([place]) 11/22/2017  . Tobacco dependence 03/11/2017  . Positive depression screening 03/11/2017  . Abnormal CT of the abdomen   . Gastroparesis   . Obesity (BMI 30-39.9) 11/15/2015  . Chronic pancreatitis ([place]) 11/13/2015  . 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 Essential hypertension 10/31/2015  . Gastroesophageal reflux  disease without esophagitis 10/31/2015  . Molluscum contagiosum 10/16/2015  . Hypertriglyceridemia 06/25/2015  . Type 2 diabetes mellitus with diabetic neuropathy, unspecified ([place]) 01/11/2014    Past Surgical History:  Procedure Laterality Date  . ESOPHAGOGASTRODUODENOSCOPY N/A 11/21/2013   Procedure: ESOPHAGOGASTRODUODENOSCOPY (EGD);  Surgeon: Wonda Horner, MD;  [facility] END[facility]COPY;  Service: Endoscopy;  Laterality: N/A;  . TONSILLECTOMY AND ADENOIDECTOMY  ~ 1999        Home Medications    Prior to Admission medications   Medication Sig Start Date End Date Taking? Authorizing Provider  albuterol (PROVENTIL HFA;VENTOLIN HFA) 108 (90 Base) MCG/ACT inhaler Inhale 1-2 puffs into the lungs every 6 (six) hours as needed for wheezing or shortness of breath. 01/11/19   Vanessa Kick, MD  azithromycin (ZITHROMAX) 250 MG tablet Take 1 tablet (250 mg total) by mouth daily. Take first 2 tablets together, then 1 every day until finished. 01/31/19   Wieters, Hallie C, PA-C  Blood Glucose Monitoring Suppl (TRUE METRIX METER) w/Device KIT Use as directed 11/22/17   Ladell Pier, MD  glucose blood (TRUE METRIX BLOOD GLUCOSE TEST) test strip Use as instructed 11/22/17   Ladell Pier, MD  ibuprofen (ADVIL,MOTRIN) 200 MG tablet Take 400 mg by mouth every 6 (six) hours as needed for headache (pain).    [provider]  insulin aspart (NOVOLOG) 100 UNIT/ML injection Inject 5-10 Units into the skin See admin  instructions. Inject 5-10 units subcutaneously three times daily after meals - CBG <200 5 units, >200 10 units    [provider]  Insulin Glargine (LANTUS) 100 UNIT/ML Solostar Pen Inject 20 Units into the skin daily at 10 pm. Patient taking differently: Inject 15 Units into the skin at bedtime.  12/20/17   Ladell Pier, MD  Insulin Pen Needle (PEN NEEDLES) 31G X 5 MM MISC 100 g by Does not apply route 2 (two) times daily. 12/20/17   Ladell Pier, MD  Insulin  Syringes, Disposable, U-100 1 ML MISC Use as directed with insulin 03/11/17   Ladell Pier, MD  Lancets MISC Use as directed 11/22/17   Ladell Pier, MD  ondansetron (ZOFRAN) 4 MG tablet Take 1 tablet (4 mg total) by mouth every 6 (six) hours. 12/26/18   Raylene Everts, MD  ranitidine (RANITIDINE 150 MAX STRENGTH) 150 MG tablet Take 1 tablet (150 mg total) by mouth 2 (two) times daily. Patient taking differently: Take 150 mg by mouth 2 (two) times daily as needed for heartburn (acid reflux).  12/20/17   Ladell Pier, MD    Family History Family History  Problem Relation Age of Onset  . Diabetes Mellitus II Father   . Heart failure Father   . Asthma Mother   . Diabetes Mellitus I Sister     Social History Social History   Tobacco Use  . Smoking status: Former Smoker    Packs/day: 1.00    Years: 3.00    Pack years: 3.00    Types: Cigarettes    Quit date: 12/11/2013    Years since quitting: 5.4  . Smokeless tobacco: Never Used  Substance Use Topics  . Alcohol use: Yes    Comment: 11/27/2015 "might have a beer once/month"  . Drug use: Yes    Frequency: 2.0 times per week    Types: Marijuana    Comment: 11/27/2015 "used to use marijuana; used it last  in ~ 2014"     Allergies   Patient has no known allergies.   Review of Systems Review of Systems  Cardiovascular: Positive for chest pain.  All other systems reviewed and are negative.    Physical Exam Updated Vital Signs BP 139/69 (BP Location: Right Arm)   Pulse 88   Temp 98.2 F (36.8 C) (Oral)   Resp 19   Ht '5\' 9"'  (1.753 m)   Wt 108.9 kg   SpO2 99%   BMI 35.44 kg/m   Physical Exam Vitals signs and nursing note reviewed.    28 year old male, appears anxious, but is in no acute distress. Vital signs are normal. Oxygen saturation is 99%, which is normal. Head is normocephalic and atraumatic. PERRLA, EOMI. Oropharynx is clear. Neck is nontender and supple without adenopathy or JVD. Back is  nontender and there is no CVA tenderness. Lungs are clear without rales, wheezes, or rhonchi. Chest is moderately tender in the mid and lower sternal area which does reproduce his pain.  There is no crepitus. Heart has regular rate and rhythm without murmur. Abdomen is soft, flat, nontender without masses or hepatosplenomegaly and peristalsis is normoactive. Extremities have no cyanosis or edema, full range of motion is present. Skin is warm and dry without rash. Neurologic: Mental status is normal, cranial nerves are intact, there are no motor or sensory deficits.  ED Treatments / Results  Labs (all labs ordered are listed, but only abnormal results are displayed) Labs Reviewed  BASIC METABOLIC PANEL - Abnormal; Notable for the following components:      Result Value   CO2 21 (*)    Glucose, Bld 271 (*)    All other components within normal limits  CBC WITH DIFFERENTIAL/PLATELET  D-DIMER, QUANTITATIVE (NOT AT [facility])  TROPONIN I (HIGH SENSITIVITY)    EKG EKG Interpretation  Date/Time:  Wednesday May 10 2019 04:46:58 EDT Ventricular Rate:  91 PR Interval:    QRS Duration: 82 QT Interval:  336 QTC Calculation: 414 R Axis:   86 Text Interpretation:  Sinus rhythm Nonspecific T abnormalities, inferior leads When compared with ECG of 12/01/2018, No significant change was found Confirmed by Delora Fuel ([id number]) on 05/10/2019 4:50:38 AM   Radiology Dg Chest Port 1 View  Result Date: 05/10/2019 CLINICAL DATA:  Chest pain EXAM: PORTABLE CHEST 1 VIEW COMPARISON:  01/31/2019 FINDINGS: The heart size and mediastinal contours are within normal limits. Both lungs are clear. The visualized skeletal structures are unremarkable. IMPRESSION: Negative chest. Electronically Signed   By: Monte Fantasia M.D.   On: 05/10/2019 05:56    Procedures Procedures   Medications Ordered in ED Medications - No data to display   Initial Impression / Assessment and Plan / ED Course  I have reviewed the  triage vital signs and the nursing notes.  Pertinent labs & imaging results that were available during my care of the patient were reviewed by me and considered in my medical decision making (see chart for details).  Pleuritic chest pain with chest wall tenderness, suspect costochondritis.  Normal heart rate and respiratory rate would argue against pulmonary embolism.  Pain is very atypical for ACS and ECG is unchanged from baseline.  Will screen with d-dimer and check troponin.  He will be given dose of ketorolac.  Old records are reviewed, and he has several prior ED visits for chest wall pain.  Also, 1 prior visit for cocaine associated chest pain.  6:24 AM He is resting comfortably, but says pain is only minimally improved with ketorolac.  Initial troponin and d-dimer are normal.  Chest x-ray is unremarkable.  We will give acetaminophen and check delta troponin.  Case is signed out to Dr. Thurnell Garbe.  Final Clinical Impressions(s) / ED Diagnoses   Final diagnoses:  Atypical chest pain    ED Discharge Orders    None       Delora Fuel, MD 63/87/56 [phone]

## 2019-05-10 NOTE — Discharge Instructions (Addendum)
Take over the counter tylenol, as directed on packaging, as needed for discomfort.  Apply moist heat or ice to the area(s) of discomfort, for 15 minutes at a time, several times per day for the next few days.  Do not fall asleep on a heating or ice pack.  Call your regular medical doctor today to schedule a follow up appointment this week.  Return to the Emergency Department immediately if worsening. ° °

## 2019-05-10 NOTE — ED Provider Notes (Signed)
Pt received at sign out with 2nd troponin pending. Please see previous EDP note for full HPI/H&P/MDM. 28yo M, c/o lower CP since approximately 0400. 2nd trop negative and EKG unchanged from previous. Doubt PE as cause for symptoms with normal d-dimer and low risk [place].  Doubt ACS as cause for symptoms with normal high sensitivity troponin x2 and unchanged EKG from previous, Heart score 2. Tx symptomatically at this time. Dx and testing d/w pt.  Questions answered.  Verb understanding, agreeable to d/c home with outpt f/u.       Francine Graven, DO 05/10/19 [phone]

## 2019-05-10 NOTE — ED Triage Notes (Signed)
Pt c/o right arm pain and hand pain starting at 1200 yesterday and now has left sided ch pain that woke him up from sleep with SOB x 30 mins ago

## 2019-05-24 ENCOUNTER — Other Ambulatory Visit: Payer: Self-pay

## 2019-05-24 DIAGNOSIS — Z5321 Procedure and treatment not carried out due to patient leaving prior to being seen by health care provider: Secondary | ICD-10-CM | POA: Insufficient documentation

## 2019-05-24 DIAGNOSIS — R42 Dizziness and giddiness: Secondary | ICD-10-CM | POA: Insufficient documentation

## 2019-05-24 LAB — BASIC METABOLIC PANEL
Anion gap: 10 (ref 5–15)
BUN: 15 mg/dL (ref 6–20)
CO2: 27 mmol/L (ref 22–32)
Calcium: 10.9 mg/dL — ABNORMAL HIGH (ref 8.9–10.3)
Chloride: 96 mmol/L — ABNORMAL LOW (ref 98–111)
Creatinine, Ser: 1.11 mg/dL (ref 0.61–1.24)
GFR calc Af Amer: >60 mL/min (ref >60)
GFR calc non Af Amer: >60 mL/min (ref >60)
Glucose, Bld: 322 mg/dL — ABNORMAL HIGH (ref 70–99)
Potassium: 4.2 mmol/L (ref 3.5–5.1)
Sodium: 133 mmol/L — ABNORMAL LOW (ref 135–145)

## 2019-05-24 LAB — URINALYSIS, COMPLETE (UACMP) WITH MICROSCOPIC
Bacteria, UA: NONE SEEN
Bilirubin Urine: NEGATIVE
Glucose, UA: >=500 mg/dL — AB
Ketones, ur: NEGATIVE mg/dL
Leukocytes,Ua: NEGATIVE
Nitrite: NEGATIVE
Protein, ur: 30 mg/dL — AB
Specific Gravity, Urine: 1.028 (ref 1.005–1.030)
Squamous Epithelial / HPF: NONE SEEN (ref 0–5)
pH: 6 (ref 5.0–8.0)

## 2019-05-24 LAB — CBC
HCT: 49.6 % (ref 39.0–52.0)
Hemoglobin: 17.3 g/dL — ABNORMAL HIGH (ref 13.0–17.0)
MCH: 29 pg (ref 26.0–34.0)
MCHC: 34.9 g/dL (ref 30.0–36.0)
MCV: 83.2 fL (ref 80.0–100.0)
Platelets: 261 10*3/uL (ref 150–400)
RBC: 5.96 MIL/uL — ABNORMAL HIGH (ref 4.22–5.81)
RDW: 12.8 % (ref 11.5–15.5)
WBC: 6.8 10*3/uL (ref 4.0–10.5)
nRBC: 0 % (ref 0.0–0.2)

## 2019-05-24 LAB — TROPONIN I (HIGH SENSITIVITY): Troponin I (High Sensitivity): 5 ng/L (ref <18)

## 2019-05-24 NOTE — ED Triage Notes (Signed)
Pt arrives to ED via POV from work with c/o dizziness since 8pm. Pt denies any c/o N/V/D, no CP, fever or SHOB. No recent illness or exposure to COVID. Pt denies feeling dizzy at this time. Pt is A&O, in NAD; RR even, regular, and unlabored.

## 2019-05-24 NOTE — ED Notes (Addendum)
Pt accomp by supervisor Dorette Grate [phone]) who requests workers comp be filed with [employer]; explained to supervisor that pt complaint is not an injury or accident but supervisor st to proceed for filing and to "bill the employer"

## 2019-05-24 NOTE — ED Notes (Signed)
workmans comp UDS completed and walked to lab.  Pt instructed to wait out in the [place] and wait to be called into a room to be seen by the doctor.

## 2019-05-25 ENCOUNTER — Emergency Department
Admission: EM | Admit: 2019-05-25 | Discharge: 2019-05-25 | Disposition: A | Discharge status: Home / Self Care | Payer: Medicaid Other | Attending: Emergency Medicine | Admitting: Emergency Medicine

## 2019-05-25 NOTE — ED Notes (Signed)
No answer when called several times from lobby 

## 2019-05-25 NOTE — ED Notes (Signed)
Pt noted leaving triage and immed walking outside

## 2019-07-30 ENCOUNTER — Emergency Department (HOSPITAL_COMMUNITY)
Admission: EM | Admit: 2019-07-30 | Discharge: 2019-07-31 | Disposition: A | Discharge status: Home / Self Care | Payer: Medicaid Other | Attending: Emergency Medicine | Admitting: Emergency Medicine

## 2019-07-30 ENCOUNTER — Encounter (HOSPITAL_COMMUNITY): Payer: Self-pay | Admitting: Emergency Medicine

## 2019-07-30 ENCOUNTER — Other Ambulatory Visit: Payer: Self-pay

## 2019-07-30 DIAGNOSIS — M5441 Lumbago with sciatica, right side: Secondary | ICD-10-CM | POA: Insufficient documentation

## 2019-07-30 DIAGNOSIS — Z87891 Personal history of nicotine dependence: Secondary | ICD-10-CM | POA: Insufficient documentation

## 2019-07-30 DIAGNOSIS — I10 Essential (primary) hypertension: Secondary | ICD-10-CM | POA: Insufficient documentation

## 2019-07-30 DIAGNOSIS — R739 Hyperglycemia, unspecified: Secondary | ICD-10-CM

## 2019-07-30 DIAGNOSIS — E1165 Type 2 diabetes mellitus with hyperglycemia: Secondary | ICD-10-CM | POA: Insufficient documentation

## 2019-07-30 DIAGNOSIS — M5431 Sciatica, right side: Secondary | ICD-10-CM

## 2019-07-30 DIAGNOSIS — Z794 Long term (current) use of insulin: Secondary | ICD-10-CM | POA: Insufficient documentation

## 2019-07-30 NOTE — ED Triage Notes (Signed)
Pt states he is having pain from R shoulder down to back of calf as well as numbness to toes on R foot and arm.

## 2019-07-31 LAB — CBG MONITORING, ED: Glucose-Capillary: 192 mg/dL — ABNORMAL HIGH (ref 70–99)

## 2019-07-31 MED ORDER — METFORMIN HCL 500 MG PO TABS: 500.0000 mg | ORAL_TABLET | Freq: Two times a day (BID) | ORAL | 0 refills | Status: AC

## 2019-07-31 MED ORDER — CYCLOBENZAPRINE HCL 10 MG PO TABS
10.0000 mg | ORAL_TABLET | Freq: Once | ORAL | Status: AC
  Administered 2019-07-31: 10 mg via ORAL
  Filled 2019-07-31: qty 1

## 2019-07-31 MED ORDER — IBUPROFEN 800 MG PO TABS
800.0000 mg | ORAL_TABLET | Freq: Once | ORAL | Status: AC
  Administered 2019-07-31: 800 mg via ORAL
  Filled 2019-07-31: qty 1

## 2019-07-31 MED ORDER — IBUPROFEN 600 MG PO TABS: 600.0000 mg | ORAL_TABLET | Freq: Four times a day (QID) | ORAL | 0 refills | Status: AC | PRN

## 2019-07-31 MED ORDER — CYCLOBENZAPRINE HCL 10 MG PO TABS: 10.0000 mg | ORAL_TABLET | Freq: Two times a day (BID) | ORAL | 0 refills | Status: DC | PRN

## 2019-07-31 NOTE — ED Provider Notes (Signed)
[facility] EMERGENCY DEPARTMENT Provider Note   CSN: [id number] Arrival date & time: 07/30/19  2209     History   Chief Complaint Chief Complaint  Patient presents with  . R sided pain    HPI Brent Costa is a 28 y.o. male with history as outlined below, most significant for type 2 DM, HTN and GERD, not currently on his Lantus and Novolog (ran out about a month ago) as been unable to afford, presenting with right lower extremity numbness in association with low back pain.  He recently started working in a job requiring heavy lifting, frequently 50-100 lb boxes and has had low back pain now with numbness/burning sensation to his right posterior calf.  He denies weakness in his leg, is ambulatory normally and denies urinary or fecal incontinence, also denies fevers or chills.  He has had no treatment for this condition prior to arrival.  He checks his cbg's routinely, reporting it was around 200 this afternoon.  He denies n/v , abdominal pain or other complaint.      The history is provided by the patient.    Past Medical History:  Diagnosis Date  . Chronic bronchitis (HCC)    "get it about q year" (11/20/2013)  . Chronic bronchitis (HCC)   . Chronic pancreatitis (HCC) 10/2015   likely present 11/2013 but no lipase at that time.  due to elevated triglycerides and ETOH  . GERD (gastroesophageal reflux disease)   . Hepatic steatosis 11/2013   seen on CT, LFTs normal.  . Hypertension   . Hypertriglyceridemia   . Microalbuminuria   . Molluscum contagiosum 06/2015   upper extremities.   . Obesity   . Type II diabetes mellitus (HCC) dx'd 11/2013   oral agents and insulin    Patient Active Problem List   Diagnosis Date Noted  . Moderate episode of recurrent major depressive disorder (HCC) 11/22/2017  . Microalbuminuria due to type 2 diabetes mellitus (HCC) 11/22/2017  . Tobacco dependence 03/11/2017  . Positive depression screening 03/11/2017  . Abnormal CT of the abdomen   .  Gastroparesis   . Obesity (BMI 30-39.9) 11/15/2015  . Chronic pancreatitis (HCC) 11/13/2015  . 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 Essential hypertension 10/31/2015  . Gastroesophageal reflux disease without esophagitis 10/31/2015  . Molluscum contagiosum 10/16/2015  . Hypertriglyceridemia 06/25/2015  . Type 2 diabetes mellitus with diabetic neuropathy, unspecified (HCC) 01/11/2014    Past Surgical History:  Procedure Laterality Date  . ESOPHAGOGASTRODUODENOSCOPY N/A 11/21/2013   Procedure: ESOPHAGOGASTRODUODENOSCOPY (EGD);  Surgeon: Graylin Shiver, MD;  Location: [facility] ENDOSCOPY;  Service: Endoscopy;  Laterality: N/A;  . TONSILLECTOMY AND ADENOIDECTOMY  ~ 1999        Home Medications    Prior to Admission medications   Medication Sig Start Date End Date Taking? Authorizing Provider  acetaminophen (TYLENOL) 500 MG tablet Take 500 mg by mouth every 6 (six) hours as needed for mild pain or moderate pain.   Yes [provider]  albuterol (PROVENTIL HFA;VENTOLIN HFA) 108 (90 Base) MCG/ACT inhaler Inhale 1-2 puffs into the lungs every 6 (six) hours as needed for wheezing or shortness of breath. 01/11/19  Yes Mardella Layman, MD  cyclobenzaprine (FLEXERIL) 10 MG tablet Take 1 tablet (10 mg total) by mouth 2 (two) times daily as needed for muscle spasms. 07/31/19   Burgess Amor, PA-C  ibuprofen (ADVIL) 600 MG tablet Take 1 tablet (600 mg total) by mouth every 6 (six) hours as needed. 07/31/19   Jarelyn Bambach,  Takeila Thayne, PA-C  insulin aspaRaynelle Fanningrt (NOVOLOG) 100 UNIT/ML injection Inject 5-10 Units into the skin See admin instructions. Inject 5-10 units subcutaneously three times daily after meals - CBG <200 5 units, >200 10 units    [provider]  Insulin Glargine (LANTUS) 100 UNIT/ML Solostar Pen Inject 20 Units into the skin daily at 10 pm. Patient not taking: Reported on 07/30/2019 12/20/17   Marcine MatarJohnson, Deborah B, MD  metFORMIN (GLUCOPHAGE) 500 MG tablet Take 1 tablet (500 mg total) by mouth 2 (two)  times daily with a meal. 07/31/19   Burgess AmorIdol, Ivana Nicastro, PA-C    Family History Family History  Problem Relation Age of Onset  . Diabetes Mellitus II Father   . Heart failure Father   . Asthma Mother   . Diabetes Mellitus I Sister     Social History Social History   Tobacco Use  . Smoking status: Former Smoker    Packs/day: 1.00    Years: 3.00    Pack years: 3.00    Types: Cigarettes    Quit date: 12/11/2013    Years since quitting: 5.6  . Smokeless tobacco: Never Used  Substance Use Topics  . Alcohol use: Yes    Comment: 11/27/2015 "might have a beer once/month"  . Drug use: Yes    Frequency: 2.0 times per week    Types: Marijuana    Comment: 11/27/2015 "used to use marijuana; used it last  in ~ 2014"     Allergies   Patient has no known allergies.   Review of Systems Review of Systems  Constitutional: Negative for chills and fever.  HENT: Negative for congestion and sore throat.   Eyes: Negative.   Respiratory: Negative for chest tightness and shortness of breath.   Cardiovascular: Negative for chest pain.  Gastrointestinal: Negative for abdominal pain, nausea and vomiting.  Genitourinary: Negative.   Musculoskeletal: Positive for back pain. Negative for arthralgias, joint swelling and neck pain.  Skin: Negative.  Negative for rash and wound.  Neurological: Positive for numbness. Negative for dizziness, weakness, light-headedness and headaches.  Psychiatric/Behavioral: Negative.      Physical Exam Updated Vital Signs BP (!) 143/80   Pulse 84   Temp 98.4 F (36.9 C) (Oral)   Resp 17   Ht 5\' 9"  (1.753 m)   Wt 111.1 kg   SpO2 100%   BMI 36.18 kg/m   Physical Exam Physical Exam  Constitutional: He is oriented to person, place, and time and well-developed, well-nourished, and in no distress.  HENT:  Head: Normocephalic and atraumatic.  Mouth/Throat: Oropharynx is clear and moist.  Neck: Normal range of motion.  Cardiovascular: Normal rate.  Pulmonary/Chest:  Effort normal and breath sounds normal.  Musculoskeletal: Normal range of motion.     Comments: Right paralumbar ttp. No midline tenderness, edema or focal pain.  Neurological: He is alert and oriented to person, place, and time. No sensory deficit. He has a normal Straight Leg Raise Test. Gait normal. Gait normal.  Reflex Scores:      Patellar reflexes are 2+ on the right side and 2+ on the left side. No weakness in major muscle groups of legs and feet, no foot drop.   Vitals reviewed.    ED Treatments / Results  Labs (all labs ordered are listed, but only abnormal results are displayed) Labs Reviewed  CBG MONITORING, ED - Abnormal; Notable for the following components:      Result Value   Glucose-Capillary 192 (*)    All other components  within normal limits    EKG None  Radiology No results found.  Procedures Procedures (including critical care time)  Medications Ordered in ED Medications  ibuprofen (ADVIL) tablet 800 mg (800 mg Oral Given 07/31/19 0027)  cyclobenzaprine (FLEXERIL) tablet 10 mg (10 mg Oral Given 07/31/19 0027)     Initial Impression / Assessment and Plan / ED Course  I have reviewed the triage vital signs and the nursing notes.  Pertinent labs & imaging results that were available during my care of the patient were reviewed by me and considered in my medical decision making (see chart for details).        Pt with exam c/w right sided sciatica. No neuro deficit on exam or by history to suggest emergent or surgical presentation.  Also discussed worsened sx that should prompt immediate re-evaluation including distal weakness, bowel/bladder retention/incontinence.  Pt also noncompliant with his diabetes medications secondary to cost and has no insurance to get his lantus and novolog.  [facility] in [place] until moved here. Referrals given for local pcp/clinics here.  In interim will place back on his metformin. He states he does have  some leftover which is not out of date.  Encouraged to start taking.  Prescribed additional metformin as well to cover until he can get in with pcp.   Ibuprofen and flexeril also prescribed for back pain. Return precautions given.         Final Clinical Impressions(s) / ED Diagnoses   Final diagnoses:  Sciatica of right side  Hyperglycemia    ED Discharge Orders         Ordered    ibuprofen (ADVIL) 600 MG tablet  Every 6 hours PRN     07/31/19 0127    cyclobenzaprine (FLEXERIL) 10 MG tablet  2 times daily PRN     07/31/19 0127    metFORMIN (GLUCOPHAGE) 500 MG tablet  2 times daily with meals     07/31/19 0127           Evalee Jefferson, PA-C 07/31/19 0151    Milton Ferguson, MD 08/01/19 1153

## 2019-07-31 NOTE — Discharge Instructions (Signed)
Take the medicines prescribed for your back pain.   Avoid lifting,  Bending,  Twisting or any other activity that worsens your pain over the next week.  Apply a heating pad to your lower back for 20 minutes 3-4 times daily.   You should get rechecked if your symptoms are not better over the next week,  Or you develop increased pain,  Weakness in your leg(s) or loss of bladder or bowel function - these can be symptoms of a worsening injury.  [facility]  [address] [place], [place] [address] [phone]  Services [facility] offers a variety of basic health services.  Services include but are not limited to: Blood pressure checks  Heart rate checks  Blood sugar checks  Urine analysis  Rapid strep tests  Pregnancy tests.  Health education and referrals  People needing more complex services will be directed to a physician online. Using these virtual visits, doctors can evaluate and prescribe medicine and treatments. There will be no medication on-site, though [facility] will help patients fill their prescriptions at little to no cost.   For More information please go to: [email or web address]

## 2019-10-12 ENCOUNTER — Other Ambulatory Visit: Payer: Self-pay

## 2019-10-12 ENCOUNTER — Emergency Department (HOSPITAL_COMMUNITY)
Admission: EM | Admit: 2019-10-12 | Discharge: 2019-10-12 | Disposition: A | Discharge status: Home / Self Care | Payer: Medicaid Other | Attending: Emergency Medicine | Admitting: Emergency Medicine

## 2019-10-12 ENCOUNTER — Encounter (HOSPITAL_COMMUNITY): Payer: Self-pay | Admitting: Emergency Medicine

## 2019-10-12 DIAGNOSIS — F17210 Nicotine dependence, cigarettes, uncomplicated: Secondary | ICD-10-CM | POA: Insufficient documentation

## 2019-10-12 DIAGNOSIS — Z794 Long term (current) use of insulin: Secondary | ICD-10-CM | POA: Insufficient documentation

## 2019-10-12 DIAGNOSIS — E119 Type 2 diabetes mellitus without complications: Secondary | ICD-10-CM | POA: Insufficient documentation

## 2019-10-12 DIAGNOSIS — Z79899 Other long term (current) drug therapy: Secondary | ICD-10-CM | POA: Insufficient documentation

## 2019-10-12 DIAGNOSIS — F1210 Cannabis abuse, uncomplicated: Secondary | ICD-10-CM | POA: Insufficient documentation

## 2019-10-12 DIAGNOSIS — R1013 Epigastric pain: Secondary | ICD-10-CM

## 2019-10-12 DIAGNOSIS — I10 Essential (primary) hypertension: Secondary | ICD-10-CM | POA: Insufficient documentation

## 2019-10-12 DIAGNOSIS — K219 Gastro-esophageal reflux disease without esophagitis: Secondary | ICD-10-CM | POA: Insufficient documentation

## 2019-10-12 LAB — CBC
HCT: 44.9 % (ref 39.0–52.0)
Hemoglobin: 16.3 g/dL (ref 13.0–17.0)
MCH: 30.5 pg (ref 26.0–34.0)
MCHC: 36.3 g/dL — ABNORMAL HIGH (ref 30.0–36.0)
MCV: 84.1 fL (ref 80.0–100.0)
Platelets: 214 10*3/uL (ref 150–400)
RBC: 5.34 MIL/uL (ref 4.22–5.81)
RDW: 13.1 % (ref 11.5–15.5)
WBC: 5.4 10*3/uL (ref 4.0–10.5)
nRBC: 0 % (ref 0.0–0.2)

## 2019-10-12 LAB — COMPREHENSIVE METABOLIC PANEL
ALT: 26 U/L (ref 0–44)
AST: 25 U/L (ref 15–41)
Albumin: 4.6 g/dL (ref 3.5–5.0)
Alkaline Phosphatase: 62 U/L (ref 38–126)
Anion gap: 15 (ref 5–15)
BUN: 12 mg/dL (ref 6–20)
CO2: 18 mmol/L — ABNORMAL LOW (ref 22–32)
Calcium: 9.7 mg/dL (ref 8.9–10.3)
Chloride: 102 mmol/L (ref 98–111)
Creatinine, Ser: 1.16 mg/dL (ref 0.61–1.24)
GFR calc Af Amer: >60 mL/min (ref >60)
GFR calc non Af Amer: >60 mL/min (ref >60)
Glucose, Bld: 396 mg/dL — ABNORMAL HIGH (ref 70–99)
Potassium: 4.5 mmol/L (ref 3.5–5.1)
Sodium: 135 mmol/L (ref 135–145)
Total Bilirubin: 1 mg/dL (ref 0.3–1.2)
Total Protein: 7.9 g/dL (ref 6.5–8.1)

## 2019-10-12 LAB — URINALYSIS, ROUTINE W REFLEX MICROSCOPIC
Bacteria, UA: NONE SEEN
Bilirubin Urine: NEGATIVE
Glucose, UA: >=500 mg/dL — AB
Ketones, ur: NEGATIVE mg/dL
Leukocytes,Ua: NEGATIVE
Nitrite: NEGATIVE
Protein, ur: NEGATIVE mg/dL
Specific Gravity, Urine: 1.028 (ref 1.005–1.030)
pH: 5 (ref 5.0–8.0)

## 2019-10-12 LAB — LIPASE, BLOOD: Lipase: 21 U/L (ref 11–51)

## 2019-10-12 LAB — CBG MONITORING, ED: Glucose-Capillary: 388 mg/dL — ABNORMAL HIGH (ref 70–99)

## 2019-10-12 MED ORDER — ALUM & MAG HYDROXIDE-SIMETH 200-200-20 MG/5ML PO SUSP
30.0000 mL | Freq: Once | ORAL | Status: AC
  Administered 2019-10-12: 30 mL via ORAL
  Filled 2019-10-12: qty 30

## 2019-10-12 MED ORDER — LIDOCAINE VISCOUS HCL 2 % MT SOLN
15.0000 mL | Freq: Once | OROMUCOSAL | Status: AC
  Administered 2019-10-12: 19:00:00 15 mL via ORAL
  Filled 2019-10-12: qty 15

## 2019-10-12 MED ORDER — PANTOPRAZOLE SODIUM 20 MG PO TBEC: 20.0000 mg | DELAYED_RELEASE_TABLET | Freq: Every day | ORAL | 0 refills | Status: DC

## 2019-10-12 NOTE — Discharge Instructions (Signed)
Please take medication as prescribed  Follow up with your PCP regarding your diabetes

## 2019-10-12 NOTE — ED Provider Notes (Signed)
[facility] EMERGENCY DEPARTMENT Provider Note   CSN: [id number] Arrival date & time: 10/12/19  1338     History Chief Complaint  Patient presents with  . Abdominal Pain    Brent Costa is a 29 y.o. male with PMHx HTN, GERD, diabetes, and hx of pancreatitis who presents to the ED today complaining of gradual onset, intermittent, epigastric abdominal cramping x 3 days. Pt also endorses nausea. He reports his pain is exacerbated after eating. He reports he ate fried chicken, asparagus, and collard greens before his symptoms started. He reports others in the house at the same thing without issue. No emesis or diarrhea. Pt states he has had similar symptoms in the past when he had pancreatitis from alcoholic use. Pt states he does not drink alcohol excessively any longer. He reports he used to be on medication for acid reflux but has not been on it in quite some time.  Patient denies fever, chills, chest pain, shortness of breath, vomiting, diarrhea, constipation, blood in stool, urinary symptoms, any other associated symptoms.  No recent antibiotic use.  No recent foreign travel.   The history is provided by the patient.       Past Medical History:  Diagnosis Date  . Chronic bronchitis (HCC)    "get it about q year" (11/20/2013)  . Chronic bronchitis (HCC)   . Chronic pancreatitis (HCC) 10/2015   likely present 11/2013 but no lipase at that time.  due to elevated triglycerides and ETOH  . GERD (gastroesophageal reflux disease)   . Hepatic steatosis 11/2013   seen on CT, LFTs normal.  . Hypertension   . Hypertriglyceridemia   . Microalbuminuria   . Molluscum contagiosum 06/2015   upper extremities.   . Obesity   . Type II diabetes mellitus (HCC) dx'd 11/2013   oral agents and insulin    Patient Active Problem List   Diagnosis Date Noted  . Moderate episode of recurrent major depressive disorder (HCC) 11/22/2017  . Microalbuminuria due to type 2 diabetes mellitus (HCC) 11/22/2017    . Tobacco dependence 03/11/2017  . Positive depression screening 03/11/2017  . Abnormal CT of the abdomen   . Gastroparesis   . Obesity (BMI 30-39.9) 11/15/2015  . Chronic pancreatitis (HCC) 11/13/2015  . 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 Essential hypertension 10/31/2015  . Gastroesophageal reflux disease without esophagitis 10/31/2015  . Molluscum contagiosum 10/16/2015  . Hypertriglyceridemia 06/25/2015  . Type 2 diabetes mellitus with diabetic neuropathy, unspecified (HCC) 01/11/2014    Past Surgical History:  Procedure Laterality Date  . ESOPHAGOGASTRODUODENOSCOPY N/A 11/21/2013   Procedure: ESOPHAGOGASTRODUODENOSCOPY (EGD);  Surgeon: Graylin Shiver, MD;  Location: [facility] ENDOSCOPY;  Service: Endoscopy;  Laterality: N/A;  . TONSILLECTOMY AND ADENOIDECTOMY  ~ 1999       Family History  Problem Relation Age of Onset  . Diabetes Mellitus II Father   . Heart failure Father   . Asthma Mother   . Diabetes Mellitus I Sister     Social History   Tobacco Use  . Smoking status: Current Some Day Smoker    Packs/day: 1.00    Years: 3.00    Pack years: 3.00    Types: Cigarettes    Last attempt to quit: 12/11/2013    Years since quitting: 5.8  . Smokeless tobacco: Never Used  Substance Use Topics  . Alcohol use: Yes    Comment: 11/27/2015 "might have a beer once/month"  . Drug use: Yes    Frequency: 2.0 times per week  Types: Marijuana    Comment: 11/27/2015 "used to use marijuana; used it last  in ~ 2014"    Home Medications Prior to Admission medications   Medication Sig Start Date End Date Taking? Authorizing Provider  acetaminophen (TYLENOL) 500 MG tablet Take 500 mg by mouth every 6 (six) hours as needed for mild pain or moderate pain.    [provider]  albuterol (PROVENTIL HFA;VENTOLIN HFA) 108 (90 Base) MCG/ACT inhaler Inhale 1-2 puffs into the lungs every 6 (six) hours as needed for wheezing or shortness of breath. 01/11/19   Mardella Layman, MD   cyclobenzaprine (FLEXERIL) 10 MG tablet Take 1 tablet (10 mg total) by mouth 2 (two) times daily as needed for muscle spasms. 07/31/19   Burgess Amor, PA-C  ibuprofen (ADVIL) 600 MG tablet Take 1 tablet (600 mg total) by mouth every 6 (six) hours as needed. 07/31/19   Burgess Amor, PA-C  insulin aspart (NOVOLOG) 100 UNIT/ML injection Inject 5-10 Units into the skin See admin instructions. Inject 5-10 units subcutaneously three times daily after meals - CBG <200 5 units, >200 10 units    [provider]  Insulin Glargine (LANTUS) 100 UNIT/ML Solostar Pen Inject 20 Units into the skin daily at 10 pm. Patient not taking: Reported on 07/30/2019 12/20/17   Marcine Matar, MD  metFORMIN (GLUCOPHAGE) 500 MG tablet Take 1 tablet (500 mg total) by mouth 2 (two) times daily with a meal. 07/31/19   Idol, Raynelle Fanning, PA-C  pantoprazole (PROTONIX) 20 MG tablet Take 1 tablet (20 mg total) by mouth daily. 10/12/19   Tanda Rockers, PA-C    Allergies    Patient has no known allergies.  Review of Systems   Review of Systems  Constitutional: Negative for chills and fever.  Gastrointestinal: Positive for abdominal pain and nausea. Negative for constipation, diarrhea and vomiting.  All other systems reviewed and are negative.   Physical Exam Updated Vital Signs BP (!) 155/82 (BP Location: Right Arm)   Pulse (!) 110   Temp 98.2 F (36.8 C) (Oral)   Ht 5\' 9"  (1.753 m)   Wt 113.4 kg   SpO2 95%   BMI 36.92 kg/m   Physical Exam Vitals and nursing note reviewed.  Constitutional:      Appearance: He is not ill-appearing or diaphoretic.  HENT:     Head: Normocephalic and atraumatic.  Eyes:     Conjunctiva/sclera: Conjunctivae normal.  Cardiovascular:     Rate and Rhythm: Normal rate and regular rhythm.     Heart sounds: Normal heart sounds.  Pulmonary:     Effort: Pulmonary effort is normal.     Breath sounds: Normal breath sounds. No wheezing, rhonchi or rales.  Abdominal:     Palpations:  Abdomen is soft.     Tenderness: There is abdominal tenderness in the epigastric area. There is no right CVA tenderness, left CVA tenderness, guarding or rebound. Negative signs include Murphy's sign.  Musculoskeletal:     Cervical back: Neck supple.  Skin:    General: Skin is warm and dry.  Neurological:     Mental Status: He is alert.     ED Results / Procedures / Treatments   Labs (all labs ordered are listed, but only abnormal results are displayed) Labs Reviewed  COMPREHENSIVE METABOLIC PANEL - Abnormal; Notable for the following components:      Result Value   CO2 18 (*)    Glucose, Bld 396 (*)    All other components within normal  limits  CBC - Abnormal; Notable for the following components:   MCHC 36.3 (*)    All other components within normal limits  URINALYSIS, ROUTINE W REFLEX MICROSCOPIC - Abnormal; Notable for the following components:   Color, Urine STRAW (*)    Glucose, UA >=500 (*)    Hgb urine dipstick SMALL (*)    All other components within normal limits  CBG MONITORING, ED - Abnormal; Notable for the following components:   Glucose-Capillary 388 (*)    All other components within normal limits  LIPASE, BLOOD    EKG None  Radiology No results found.  Procedures Procedures (including critical care time)  Medications Ordered in ED Medications  alum & mag hydroxide-simeth (MAALOX/MYLANTA) 200-200-20 MG/5ML suspension 30 mL (30 mLs Oral Given 10/12/19 1855)    And  lidocaine (XYLOCAINE) 2 % viscous mouth solution 15 mL (15 mLs Oral Given 10/12/19 1855)    ED Course  I have reviewed the triage vital signs and the nursing notes.  Pertinent labs & imaging results that were available during my care of the patient were reviewed by me and considered in my medical decision making (see chart for details).  29 year old male who presents to the ED with epigastric pain intermittently for the past 3 days, exacerbated after eating food.  Has a history of acid  reflux and states he has been out of medication for quite some time.  Also has a history of pancreatitis although he states he does not drink any longer.  Per chart review it does appear that this was due to high triglycerides and not from alcohol.  Patient has a history of diabetes and states he is compliant with his medications although states he typically runs in the [place].  He has not seen his PCP in quite some time to have his A1c level checked or to discuss changing his medications.  Nodes are stable today.  Patient is afebrile and nontachypneic.  He is mildly tachycardic in the 110s although suspect this is likely due to pain.  On my exam he is no longer tachycardic.  He has tenderness to the epigastrium without any upper quadrant or right upper quadrant abdominal pain.  Lab work was obtained prior to being seen.   CBC without leukocytosis.  CMP with glucose of 396 and bicarb of 18 however no anion gap.  Rest of electrolytes are within normal limits.  91.16 although this appears to be patient's baseline.  No increase in LFTs today.  Lipase within normal limits.  Urinalysis with greater than 500 glucose and small hemoglobin although no signs of infection today.  Is appear patient has had hemoglobin with urine dipstick on previous urinalysis.  He has no CVA tenderness to suggest kidney stones.  Suspect his symptoms are related to acid reflux.  Will give GI cocktail and reevaluate.  Will consider discharging home with Protonix and follow-up with his PCP   Upon reevaluation pt states his pain has improved with GI cocktail. Stable for discharge home.   This note was prepared using Dragon voice recognition software and may include unintentional dictation errors due to the inherent limitations of voice recognition software.     MDM Rules/Calculators/A&P                       Final Clinical Impression(s) / ED Diagnoses Final diagnoses:  Epigastric pain  Gastroesophageal reflux disease without  esophagitis    Rx / DC Orders ED Discharge Orders  Ordered    pantoprazole (PROTONIX) 20 MG tablet  Daily     10/12/19 1942           Discharge Instructions     Please take medication as prescribed  Follow up with your PCP regarding your diabetes       Milinda Antis 10/12/19 1943    Donnetta Hutching, MD 10/13/19 2215

## 2019-10-12 NOTE — ED Triage Notes (Signed)
Pt reports abd pain for last several days. Pt reports intermittent cramping and history of pancreatitis. nad noted.

## 2019-10-17 ENCOUNTER — Ambulatory Visit: Payer: Medicaid Other | Attending: Internal Medicine

## 2019-10-17 ENCOUNTER — Other Ambulatory Visit: Payer: Self-pay

## 2019-10-17 DIAGNOSIS — Z20822 Contact with and (suspected) exposure to covid-19: Secondary | ICD-10-CM

## 2019-10-18 LAB — NOVEL CORONAVIRUS, NAA: SARS-CoV-2, NAA: NOT DETECTED

## 2020-02-19 ENCOUNTER — Other Ambulatory Visit: Payer: Self-pay

## 2020-02-19 ENCOUNTER — Ambulatory Visit (HOSPITAL_COMMUNITY)
Admission: EM | Admit: 2020-02-19 | Discharge: 2020-02-19 | Disposition: A | Discharge status: Other Acute Inpatient Hospital | Payer: Medicaid Other

## 2020-02-19 ENCOUNTER — Encounter (HOSPITAL_COMMUNITY): Payer: Self-pay

## 2020-02-19 DIAGNOSIS — R61 Generalized hyperhidrosis: Secondary | ICD-10-CM

## 2020-02-19 DIAGNOSIS — Z8719 Personal history of other diseases of the digestive system: Secondary | ICD-10-CM

## 2020-02-19 DIAGNOSIS — R1013 Epigastric pain: Secondary | ICD-10-CM

## 2020-02-19 DIAGNOSIS — R000 Tachycardia, unspecified: Secondary | ICD-10-CM

## 2020-02-19 DIAGNOSIS — E1165 Type 2 diabetes mellitus with hyperglycemia: Secondary | ICD-10-CM

## 2020-02-19 NOTE — ED Notes (Signed)
Patient is being discharged from the Urgent Care Center and sent to the Emergency Department via POV. Per Wallis Bamberg, PA patient is stable but in need of higher level of care due to r/o pancreatitis. Patient is aware and verbalizes understanding of plan of care.  Vitals:   02/19/20 1946  BP: 137/89  Pulse: (!) 104  Resp: 19  Temp: 98.1 F (36.7 C)  SpO2: 100%

## 2020-02-19 NOTE — ED Provider Notes (Signed)
[facility]   MRN: [id number] DOB: 01-Dec-1990  Subjective:   Brent Costa is a 29 y.o. male presenting for 3 day hx of right sided flank pain, mid-sternal lower chest pain, moderate in severity with nausea and vomiting. Has uncontrolled diabetes, admits medical non-compliance. Has a hx of pancreatitis. Drank heavily last week for a friend's birthday party.   No current facility-administered medications for this encounter.  Current Outpatient Medications:  .  acetaminophen (TYLENOL) 500 MG tablet, Take 500 mg by mouth every 6 (six) hours as needed for mild pain or moderate pain., Disp: , Rfl:  .  albuterol (PROVENTIL HFA;VENTOLIN HFA) 108 (90 Base) MCG/ACT inhaler, Inhale 1-2 puffs into the lungs every 6 (six) hours as needed for wheezing or shortness of breath., Disp: 1 Inhaler, Rfl: 0 .  cyclobenzaprine (FLEXERIL) 10 MG tablet, Take 1 tablet (10 mg total) by mouth 2 (two) times daily as needed for muscle spasms., Disp: 20 tablet, Rfl: 0 .  ibuprofen (ADVIL) 600 MG tablet, Take 1 tablet (600 mg total) by mouth every 6 (six) hours as needed., Disp: 30 tablet, Rfl: 0 .  insulin aspart (NOVOLOG) 100 UNIT/ML injection, Inject 5-10 Units into the skin See admin instructions. Inject 5-10 units subcutaneously three times daily after meals - CBG <200 5 units, >200 10 units, Disp: , Rfl:  .  Insulin Glargine (LANTUS) 100 UNIT/ML Solostar Pen, Inject 20 Units into the skin daily at 10 pm. (Patient not taking: Reported on 07/30/2019), Disp: 15 mL, Rfl: 6 .  metFORMIN (GLUCOPHAGE) 500 MG tablet, Take 1 tablet (500 mg total) by mouth 2 (two) times daily with a meal., Disp: 60 tablet, Rfl: 0 .  pantoprazole (PROTONIX) 20 MG tablet, Take 1 tablet (20 mg total) by mouth daily., Disp: 30 tablet, Rfl: 0   No Known Allergies  Past Medical History:  Diagnosis Date  . Chronic bronchitis (HCC)    "get it about q year" (11/20/2013)  . Chronic bronchitis (HCC)   . Chronic pancreatitis (HCC) 10/2015     likely present 11/2013 but no lipase at that time.  due to elevated triglycerides and ETOH  . GERD (gastroesophageal reflux disease)   . Hepatic steatosis 11/2013   seen on CT, LFTs normal.  . Hypertension   . Hypertriglyceridemia   . Microalbuminuria   . Molluscum contagiosum 06/2015   upper extremities.   . Obesity   . Type II diabetes mellitus (HCC) dx'd 11/2013   oral agents and insulin     Past Surgical History:  Procedure Laterality Date  . ESOPHAGOGASTRODUODENOSCOPY N/A 11/21/2013   Procedure: ESOPHAGOGASTRODUODENOSCOPY (EGD);  Surgeon: Graylin Shiver, MD;  Location: [facility] ENDOSCOPY;  Service: Endoscopy;  Laterality: N/A;  . TONSILLECTOMY AND ADENOIDECTOMY  ~ 1999    Family History  Problem Relation Age of Onset  . Diabetes Mellitus II Father   . Heart failure Father   . Asthma Mother   . Diabetes Mellitus I Sister     Social History   Tobacco Use  . Smoking status: Current Some Day Smoker    Packs/day: 1.00    Years: 3.00    Pack years: 3.00    Types: Cigarettes    Last attempt to quit: 12/11/2013    Years since quitting: 6.1  . Smokeless tobacco: Never Used  Substance Use Topics  . Alcohol use: Yes    Comment: 11/27/2015 "might have a beer once/month"  . Drug use: Yes    Frequency: 2.0 times per week  Types: Marijuana    Comment: 11/27/2015 "used to use marijuana; used it last  in ~ 2014"    ROS   Objective:   Vitals: BP 137/89 (BP Location: Right Arm)   Pulse (!) 104   Temp 98.1 F (36.7 C) (Oral)   Resp 19   SpO2 100%   Physical Exam Constitutional:      General: He is not in acute distress.    Appearance: Normal appearance. He is well-developed. He is diaphoretic. He is not ill-appearing or toxic-appearing.  HENT:     Head: Normocephalic and atraumatic.     Right Ear: External ear normal.     Left Ear: External ear normal.     Nose: Nose normal.     Mouth/Throat:     Mouth: Mucous membranes are moist.     Pharynx: Oropharynx is clear.   Eyes:     General: No scleral icterus.    Extraocular Movements: Extraocular movements intact.     Pupils: Pupils are equal, round, and reactive to light.  Cardiovascular:     Rate and Rhythm: Regular rhythm. Tachycardia present.     Heart sounds: Normal heart sounds. No murmur. No friction rub. No gallop.   Pulmonary:     Effort: Pulmonary effort is normal. No respiratory distress.     Breath sounds: Normal breath sounds. No stridor. No wheezing, rhonchi or rales.  Abdominal:     General: Bowel sounds are normal. There is distension.     Palpations: There is no mass.     Tenderness: There is abdominal tenderness (epigastric). There is right CVA tenderness and left CVA tenderness. There is no guarding or rebound.  Neurological:     Mental Status: He is alert and oriented to person, place, and time.  Psychiatric:        Mood and Affect: Mood normal.        Behavior: Behavior normal.        Thought Content: Thought content normal.        Judgment: Judgment normal.      Assessment and Plan :   PDMP not reviewed this encounter.  1. Abdominal pain, epigastric   2. History of pancreatitis   3. Uncontrolled type 2 diabetes mellitus with hyperglycemia ([place])   4. Tachycardia   5. Diaphoresis     Sx and PE findings concerning for recurrent pancreatitis especially given uncontrolled diabetes, recent heavy drinking. Recommended patient report to the ER for lipase, CT abdomen. Patient contracts for safety, will have a family member drive him there now.    [name], [place] 02/19/20 2042

## 2020-02-19 NOTE — ED Triage Notes (Signed)
Pt is here with on & off back/chest tightness and vomiting that started Friday. Pt has taken Advil to relieve discomfort.

## 2020-02-19 NOTE — Discharge Instructions (Signed)
I am concerned that you are having recurrent pancreatitis. Please have your family drive you to the ER to get a CT scan of your abdomen for ruling out pancreatitis.

## 2020-02-22 ENCOUNTER — Other Ambulatory Visit: Payer: Self-pay

## 2020-02-22 ENCOUNTER — Emergency Department (HOSPITAL_COMMUNITY)
Admission: EM | Admit: 2020-02-22 | Discharge: 2020-02-22 | Disposition: A | Discharge status: Home / Self Care | Payer: Self-pay | Attending: Emergency Medicine | Admitting: Emergency Medicine

## 2020-02-22 DIAGNOSIS — Z5321 Procedure and treatment not carried out due to patient leaving prior to being seen by health care provider: Secondary | ICD-10-CM | POA: Insufficient documentation

## 2020-02-22 DIAGNOSIS — R109 Unspecified abdominal pain: Secondary | ICD-10-CM | POA: Insufficient documentation

## 2020-02-22 DIAGNOSIS — R1110 Vomiting, unspecified: Secondary | ICD-10-CM | POA: Insufficient documentation

## 2020-02-22 LAB — COMPREHENSIVE METABOLIC PANEL
ALT: 19 U/L (ref 0–44)
AST: 33 U/L (ref 15–41)
Albumin: 4.7 g/dL (ref 3.5–5.0)
Alkaline Phosphatase: 70 U/L (ref 38–126)
Anion gap: 19 — ABNORMAL HIGH (ref 5–15)
BUN: 7 mg/dL (ref 6–20)
CO2: 18 mmol/L — ABNORMAL LOW (ref 22–32)
Calcium: 9.5 mg/dL (ref 8.9–10.3)
Chloride: 95 mmol/L — ABNORMAL LOW (ref 98–111)
Creatinine, Ser: 1.05 mg/dL (ref 0.61–1.24)
GFR calc Af Amer: >60 mL/min (ref >60)
GFR calc non Af Amer: >60 mL/min (ref >60)
Glucose, Bld: 448 mg/dL — ABNORMAL HIGH (ref 70–99)
Potassium: 4.4 mmol/L (ref 3.5–5.1)
Sodium: 132 mmol/L — ABNORMAL LOW (ref 135–145)
Total Bilirubin: 1.1 mg/dL (ref 0.3–1.2)
Total Protein: 7.2 g/dL (ref 6.5–8.1)

## 2020-02-22 LAB — CBC
HCT: 47.2 % (ref 39.0–52.0)
Hemoglobin: 16.8 g/dL (ref 13.0–17.0)
MCH: 30.1 pg (ref 26.0–34.0)
MCHC: 35.6 g/dL (ref 30.0–36.0)
MCV: 84.4 fL (ref 80.0–100.0)
Platelets: 218 10*3/uL (ref 150–400)
RBC: 5.59 MIL/uL (ref 4.22–5.81)
RDW: 12.1 % (ref 11.5–15.5)
WBC: 4.6 10*3/uL (ref 4.0–10.5)
nRBC: 0 % (ref 0.0–0.2)

## 2020-02-22 LAB — LIPASE, BLOOD: Lipase: 22 U/L (ref 11–51)

## 2020-02-22 MED ORDER — SODIUM CHLORIDE 0.9% FLUSH: 3.0000 mL | Freq: Once | INTRAVENOUS | Status: DC

## 2020-02-22 NOTE — ED Notes (Signed)
Pt requested to leave so that he could seek care at another facility

## 2020-02-22 NOTE — ED Triage Notes (Signed)
Pt advised by UC to come here for CT scan d/t symptoms of pancreatitis-abdominal pain and vomiting onset Friday. No vomiting since Monday. Hx same, endorses drinking alcohol last week prior to pain.

## 2020-02-26 ENCOUNTER — Emergency Department (HOSPITAL_COMMUNITY)
Admission: EM | Admit: 2020-02-26 | Discharge: 2020-02-27 | Disposition: A | Discharge status: Home / Self Care | Payer: Self-pay | Attending: Emergency Medicine | Admitting: Emergency Medicine

## 2020-02-26 ENCOUNTER — Encounter (HOSPITAL_COMMUNITY): Payer: Self-pay | Admitting: *Deleted

## 2020-02-26 ENCOUNTER — Emergency Department (HOSPITAL_COMMUNITY): Payer: Self-pay

## 2020-02-26 ENCOUNTER — Other Ambulatory Visit: Payer: Self-pay

## 2020-02-26 DIAGNOSIS — R1013 Epigastric pain: Secondary | ICD-10-CM | POA: Insufficient documentation

## 2020-02-26 DIAGNOSIS — R1010 Upper abdominal pain, unspecified: Secondary | ICD-10-CM | POA: Insufficient documentation

## 2020-02-26 DIAGNOSIS — E1140 Type 2 diabetes mellitus with diabetic neuropathy, unspecified: Secondary | ICD-10-CM | POA: Insufficient documentation

## 2020-02-26 DIAGNOSIS — Z79899 Other long term (current) drug therapy: Secondary | ICD-10-CM | POA: Insufficient documentation

## 2020-02-26 DIAGNOSIS — I10 Essential (primary) hypertension: Secondary | ICD-10-CM | POA: Insufficient documentation

## 2020-02-26 DIAGNOSIS — F17210 Nicotine dependence, cigarettes, uncomplicated: Secondary | ICD-10-CM | POA: Insufficient documentation

## 2020-02-26 DIAGNOSIS — Z794 Long term (current) use of insulin: Secondary | ICD-10-CM | POA: Insufficient documentation

## 2020-02-26 LAB — COMPREHENSIVE METABOLIC PANEL
ALT: 21 U/L (ref 0–44)
AST: 22 U/L (ref 15–41)
Albumin: 4.7 g/dL (ref 3.5–5.0)
Alkaline Phosphatase: 64 U/L (ref 38–126)
Anion gap: 15 (ref 5–15)
BUN: 11 mg/dL (ref 6–20)
CO2: 20 mmol/L — ABNORMAL LOW (ref 22–32)
Calcium: 9.3 mg/dL (ref 8.9–10.3)
Chloride: 99 mmol/L (ref 98–111)
Creatinine, Ser: 0.88 mg/dL (ref 0.61–1.24)
GFR calc Af Amer: >60 mL/min (ref >60)
GFR calc non Af Amer: >60 mL/min (ref >60)
Glucose, Bld: 316 mg/dL — ABNORMAL HIGH (ref 70–99)
Potassium: 4.1 mmol/L (ref 3.5–5.1)
Sodium: 134 mmol/L — ABNORMAL LOW (ref 135–145)
Total Bilirubin: 1.1 mg/dL (ref 0.3–1.2)
Total Protein: 7.7 g/dL (ref 6.5–8.1)

## 2020-02-26 LAB — CBC
HCT: 45.9 % (ref 39.0–52.0)
Hemoglobin: 16.3 g/dL (ref 13.0–17.0)
MCH: 29.8 pg (ref 26.0–34.0)
MCHC: 35.5 g/dL (ref 30.0–36.0)
MCV: 83.9 fL (ref 80.0–100.0)
Platelets: 233 10*3/uL (ref 150–400)
RBC: 5.47 MIL/uL (ref 4.22–5.81)
RDW: 12.2 % (ref 11.5–15.5)
WBC: 4.9 10*3/uL (ref 4.0–10.5)
nRBC: 0 % (ref 0.0–0.2)

## 2020-02-26 LAB — TROPONIN I (HIGH SENSITIVITY)
Troponin I (High Sensitivity): 3 ng/L (ref <18)
Troponin I (High Sensitivity): 3 ng/L (ref <18)

## 2020-02-26 LAB — LIPASE, BLOOD: Lipase: 20 U/L (ref 11–51)

## 2020-02-26 MED ORDER — ALUM & MAG HYDROXIDE-SIMETH 200-200-20 MG/5ML PO SUSP
30.0000 mL | Freq: Once | ORAL | Status: AC
  Administered 2020-02-26: 30 mL via ORAL
  Filled 2020-02-26: qty 30

## 2020-02-26 MED ORDER — MORPHINE SULFATE (PF) 4 MG/ML IV SOLN
4.0000 mg | Freq: Once | INTRAVENOUS | Status: AC
  Administered 2020-02-26: 4 mg via INTRAVENOUS
  Filled 2020-02-26: qty 1

## 2020-02-26 MED ORDER — LIDOCAINE VISCOUS HCL 2 % MT SOLN
15.0000 mL | Freq: Once | OROMUCOSAL | Status: AC
  Administered 2020-02-26: 15 mL via ORAL
  Filled 2020-02-26: qty 15

## 2020-02-26 MED ORDER — SODIUM CHLORIDE 0.9 % IV BOLUS
1000.0000 mL | Freq: Once | INTRAVENOUS | Status: AC
  Administered 2020-02-26: 1000 mL via INTRAVENOUS

## 2020-02-26 MED ORDER — ONDANSETRON HCL 4 MG/2ML IJ SOLN
4.0000 mg | Freq: Once | INTRAMUSCULAR | Status: AC
  Administered 2020-02-26: 4 mg via INTRAVENOUS
  Filled 2020-02-26: qty 2

## 2020-02-26 MED ORDER — PANTOPRAZOLE SODIUM 40 MG PO TBEC
40.0000 mg | DELAYED_RELEASE_TABLET | Freq: Once | ORAL | Status: AC
  Administered 2020-02-26: 40 mg via ORAL
  Filled 2020-02-26: qty 1

## 2020-02-26 NOTE — ED Provider Notes (Signed)
[facility] EMERGENCY DEPARTMENT Provider Note   CSN: [id number] Arrival date & time: 02/26/20  1537   History Chief Complaint  Patient presents with  . Abdominal Pain  . Chest Pain    Brent Costa is a 29 y.o. male.  The history is provided by the patient.  Abdominal Pain Associated symptoms: chest pain   Chest Pain Associated symptoms: abdominal pain   Has history of hypertension, diabetes, hyperlipidemia, pancreatitis and comes in complaining of abdominal pain and chest pain for the last 2 weeks.  He describes a dull, pressure pain in the epigastric area with some radiation to the mid chest.  There is associated nausea with intermittent vomiting.  There is no radiation of pain to the back.  He denies fever or chills.  He does admit to alcohol ingestion prior to onset of pain.  He did go to urgent care 1 week ago and was told that he probably had recurrent pancreatitis and was told he needed to go to the emergency department.  He has not been taking anything for pain.  He currently rates pain at 8/10.  He denies any constipation or diarrhea.  Past Medical History:  Diagnosis Date  . Chronic bronchitis ([place])    "get it about q year" (11/20/2013)  . Chronic bronchitis ([place])   . Chronic pancreatitis ([place]) 10/2015   likely present 11/2013 but no lipase at that time.  due to elevated triglycerides and ETOH  . GERD (gastroesophageal reflux disease)   . Hepatic steatosis 11/2013   seen on CT, LFTs normal.  . Hypertension   . Hypertriglyceridemia   . Microalbuminuria   . Molluscum contagiosum 06/2015   upper extremities.   . Obesity   . Type II diabetes mellitus ([place]) dx'd 11/2013   oral agents and insulin    Patient Active Problem List   Diagnosis Date Noted  . Moderate episode of recurrent major depressive disorder ([place]) 11/22/2017  . Microalbuminuria due to type 2 diabetes mellitus ([employer]) 11/22/2017  . Tobacco dependence 03/11/2017  . Positive depression screening 03/11/2017   . Abnormal CT of the abdomen   . Gastroparesis   . Obesity (BMI 30-39.9) 11/15/2015  . Chronic pancreatitis ([place]) 11/13/2015  . 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 Essential hypertension 10/31/2015  . Gastroesophageal reflux disease without esophagitis 10/31/2015  . Molluscum contagiosum 10/16/2015  . Hypertriglyceridemia 06/25/2015  . Type 2 diabetes mellitus with diabetic neuropathy, unspecified ([place]) 01/11/2014    Past Surgical History:  Procedure Laterality Date  . ESOPHAGOGASTRODUODENOSCOPY N/A 11/21/2013   Procedure: ESOPHAGOGASTRODUODENOSCOPY (EGD);  Surgeon: Wonda Horner, MD;  Location: [facility] ENDOSCOPY;  Service: Endoscopy;  Laterality: N/A;  . TONSILLECTOMY AND ADENOIDECTOMY  ~ 1999       Family History  Problem Relation Age of Onset  . Diabetes Mellitus II Father   . Heart failure Father   . Asthma Mother   . Diabetes Mellitus I Sister     Social History   Tobacco Use  . Smoking status: Current Some Day Smoker    Packs/day: 1.00    Years: 3.00    Pack years: 3.00    Types: Cigarettes    Last attempt to quit: 12/11/2013    Years since quitting: 6.2  . Smokeless tobacco: Never Used  Substance Use Topics  . Alcohol use: Yes    Comment: occasionally   . Drug use: Not Currently    Frequency: 2.0 times per week    Types: Marijuana    Comment: 11/27/2015 "  used to use marijuana; used it last  in ~ 2014"    Home Medications Prior to Admission medications   Medication Sig Start Date End Date Taking? Authorizing Provider  acetaminophen (TYLENOL) 500 MG tablet Take 500 mg by mouth every 6 (six) hours as needed for mild pain or moderate pain.    [provider]  albuterol (PROVENTIL HFA;VENTOLIN HFA) 108 (90 Base) MCG/ACT inhaler Inhale 1-2 puffs into the lungs every 6 (six) hours as needed for wheezing or shortness of breath. 01/11/19   Mardella Layman, MD  cyclobenzaprine (FLEXERIL) 10 MG tablet Take 1 tablet (10 mg total) by mouth 2 (two) times daily as needed  for muscle spasms. 07/31/19   Burgess Amor, PA-C  ibuprofen (ADVIL) 600 MG tablet Take 1 tablet (600 mg total) by mouth every 6 (six) hours as needed. 07/31/19   Burgess Amor, PA-C  insulin aspart (NOVOLOG) 100 UNIT/ML injection Inject 5-10 Units into the skin See admin instructions. Inject 5-10 units subcutaneously three times daily after meals - CBG <200 5 units, >200 10 units    [provider]  Insulin Glargine (LANTUS) 100 UNIT/ML Solostar Pen Inject 20 Units into the skin daily at 10 pm. Patient not taking: Reported on 07/30/2019 12/20/17   Marcine Matar, MD  metFORMIN (GLUCOPHAGE) 500 MG tablet Take 1 tablet (500 mg total) by mouth 2 (two) times daily with a meal. 07/31/19   Idol, Raynelle Fanning, PA-C  pantoprazole (PROTONIX) 20 MG tablet Take 1 tablet (20 mg total) by mouth daily. 10/12/19   Tanda Rockers, PA-C    Allergies    Patient has no known allergies.  Review of Systems   Review of Systems  Cardiovascular: Positive for chest pain.  Gastrointestinal: Positive for abdominal pain.  All other systems reviewed and are negative.   Physical Exam Updated Vital Signs BP (!) 146/79 (BP Location: Right Arm)   Pulse 77   Temp 98 F (36.7 C) (Oral)   Resp 16   Ht 5\' 9"  (1.753 m)   Wt 113.4 kg   SpO2 98%   BMI 36.92 kg/m   Physical Exam Vitals and nursing note reviewed.   29 year old male, resting comfortably and in no acute distress. Vital signs are significant for elevated blood pressure. Oxygen saturation is 98%, which is normal. Head is normocephalic and atraumatic. PERRLA, EOMI. Oropharynx is clear. Neck is nontender and supple without adenopathy or JVD. Back is nontender and there is no CVA tenderness. Lungs are clear without rales, wheezes, or rhonchi. Chest is nontender. Heart has regular rate and rhythm without murmur. Abdomen is soft, flat, with mild to moderate epigastric tenderness.  There is no rebound or guarding.  There are no masses or hepatosplenomegaly  and peristalsis is hypoactive. Extremities have no cyanosis or edema, full range of motion is present. Skin is warm and dry without rash. Neurologic: Mental status is normal, cranial nerves are intact, there are no motor or sensory deficits.  ED Results / Procedures / Treatments   Labs (all labs ordered are listed, but only abnormal results are displayed) Labs Reviewed  COMPREHENSIVE METABOLIC PANEL - Abnormal; Notable for the following components:      Result Value   Sodium 134 (*)    CO2 20 (*)    Glucose, Bld 316 (*)    All other components within normal limits  LIPASE, BLOOD  CBC  URINALYSIS, ROUTINE W REFLEX MICROSCOPIC  TROPONIN I (HIGH SENSITIVITY)  TROPONIN I (HIGH SENSITIVITY)  EKG EKG Interpretation  Date/Time:  Monday Feb 26 2020 16:37:19 EDT Ventricular Rate:  97 PR Interval:  146 QRS Duration: 82 QT Interval:  326 QTC Calculation: 414 R Axis:   98 Text Interpretation: Normal sinus rhythm Rightward axis T wave abnormality, consider inferior ischemia Abnormal ECG When compared with ECG of 05/24/2019, T wave abnormality is now present Confirmed by Dione Booze ([id number]) on 02/26/2020 10:54:48 PM   Radiology DG Chest 2 View  Result Date: 02/26/2020 CLINICAL DATA:  Chest pain. EXAM: CHEST - 2 VIEW COMPARISON:  May 10, 2019. FINDINGS: The heart size and mediastinal contours are within normal limits. Both lungs are clear. No pneumothorax or pleural effusion is noted. The visualized skeletal structures are unremarkable. IMPRESSION: No active cardiopulmonary disease. Electronically Signed   By: Lupita Raider M.D.   On: 02/26/2020 17:09    Procedures Procedures   Medications Ordered in ED Medications  sodium chloride 0.9 % bolus 1,000 mL (0 mLs Intravenous Stopped 02/27/20 0243)  ondansetron (ZOFRAN) injection 4 mg (4 mg Intravenous Given 02/26/20 2335)  morphine 4 MG/ML injection 4 mg (4 mg Intravenous Given 02/26/20 2335)  alum & mag hydroxide-simeth  (MAALOX/MYLANTA) 200-200-20 MG/5ML suspension 30 mL (30 mLs Oral Given 02/26/20 2336)    And  lidocaine (XYLOCAINE) 2 % viscous mouth solution 15 mL (15 mLs Oral Given 02/26/20 2335)  pantoprazole (PROTONIX) EC tablet 40 mg (40 mg Oral Given 02/26/20 2336)    ED Course  I have reviewed the triage vital signs and the nursing notes.  Pertinent labs & imaging results that were available during my care of the patient were reviewed by me and considered in my medical decision making (see chart for details).  MDM Rules/Calculators/A&P Abdominal pain consistent with pancreatitis.  Other possibilities include peptic ulcer disease, GERD, irritable bowel syndrome.  Old records are reviewed confirming urgent care visit on 5/17 at which time he was advised to go to emergency for CT of abdomen.  He did not go to the ED that day but did go on 5/20 and left without being seen.  He did have CT scans of abdomen and pelvis in 2017 which showed evidence of pancreatitis and had an elevated lipase at that time.  However, that is the only occasion where he had an elevated lipase.  Given only 1 documented episode of pancreatitis, I believe lipase is still reasonably good indicator of pancreatic inflammation and doubt that he actually has pancreatitis today.  He will be given IV fluids as well as GI cocktail and pantoprazole.  He will be given a single dose of morphine and will be given dose of ondansetron for nausea.  Troponin has been ordered because of component of chest pain and is normal, chest x-ray is unremarkable, ECG does show T wave abnormality which had not been present in August 2020, but chest pain is not seen consistent with ACS.  Troponin is normal x2, arguing against ACS.  He got significant relief with above-noted treatment.  He is advised to take over-the-counter omeprazole, supplement with antacids as needed.  Recommended that he stop smoking and avoid alcohol.  Prescription given for ondansetron.  Return  precautions discussed.  Final Clinical Impression(s) / ED Diagnoses Final diagnoses:  Upper abdominal pain    Rx / DC Orders ED Discharge Orders         Ordered    ondansetron (ZOFRAN) 4 MG tablet  Every 6 hours PRN     02/27/20 0316  Dione Booze, MD 02/27/20 [phone]

## 2020-02-26 NOTE — ED Triage Notes (Signed)
Pt c/o upper abdominal pain since May 14. Pt has hx of pancreatitis. Pt also c/o mid chest pain that started last night.

## 2020-02-27 MED ORDER — ONDANSETRON HCL 4 MG PO TABS: 4.0000 mg | ORAL_TABLET | Freq: Four times a day (QID) | ORAL | 0 refills | Status: DC | PRN

## 2020-02-27 NOTE — Discharge Instructions (Addendum)
Take omeprazole (Prilosec OTC) once a day, every day.  Take antacids as needed.  Do not drink anything with alcohol in it - it will make your stomach worse.  Try to stop smoking - smoking makes stomach problems worse.  Return if your symptoms are getting worse.

## 2020-03-02 ENCOUNTER — Emergency Department (HOSPITAL_COMMUNITY)
Admission: EM | Admit: 2020-03-02 | Discharge: 2020-03-02 | Disposition: A | Discharge status: Home / Self Care | Payer: Medicaid Other | Attending: Emergency Medicine | Admitting: Emergency Medicine

## 2020-03-02 ENCOUNTER — Other Ambulatory Visit: Payer: Self-pay

## 2020-03-02 ENCOUNTER — Encounter (HOSPITAL_COMMUNITY): Payer: Self-pay

## 2020-03-02 DIAGNOSIS — R1010 Upper abdominal pain, unspecified: Secondary | ICD-10-CM | POA: Insufficient documentation

## 2020-03-02 DIAGNOSIS — I10 Essential (primary) hypertension: Secondary | ICD-10-CM | POA: Insufficient documentation

## 2020-03-02 DIAGNOSIS — Y904 Blood alcohol level of 80-99 mg/100 ml: Secondary | ICD-10-CM | POA: Insufficient documentation

## 2020-03-02 DIAGNOSIS — E1165 Type 2 diabetes mellitus with hyperglycemia: Secondary | ICD-10-CM | POA: Insufficient documentation

## 2020-03-02 DIAGNOSIS — Z794 Long term (current) use of insulin: Secondary | ICD-10-CM | POA: Insufficient documentation

## 2020-03-02 DIAGNOSIS — R112 Nausea with vomiting, unspecified: Secondary | ICD-10-CM | POA: Insufficient documentation

## 2020-03-02 DIAGNOSIS — E860 Dehydration: Secondary | ICD-10-CM

## 2020-03-02 DIAGNOSIS — R739 Hyperglycemia, unspecified: Secondary | ICD-10-CM

## 2020-03-02 DIAGNOSIS — F17210 Nicotine dependence, cigarettes, uncomplicated: Secondary | ICD-10-CM | POA: Insufficient documentation

## 2020-03-02 DIAGNOSIS — Z20822 Contact with and (suspected) exposure to covid-19: Secondary | ICD-10-CM | POA: Insufficient documentation

## 2020-03-02 DIAGNOSIS — F10920 Alcohol use, unspecified with intoxication, uncomplicated: Secondary | ICD-10-CM | POA: Insufficient documentation

## 2020-03-02 LAB — COMPREHENSIVE METABOLIC PANEL
ALT: 16 U/L (ref 0–44)
AST: 16 U/L (ref 15–41)
Albumin: 4 g/dL (ref 3.5–5.0)
Alkaline Phosphatase: 41 U/L (ref 38–126)
Anion gap: 16 — ABNORMAL HIGH (ref 5–15)
BUN: 7 mg/dL (ref 6–20)
CO2: 17 mmol/L — ABNORMAL LOW (ref 22–32)
Calcium: 8.5 mg/dL — ABNORMAL LOW (ref 8.9–10.3)
Chloride: 101 mmol/L (ref 98–111)
Creatinine, Ser: 0.83 mg/dL (ref 0.61–1.24)
GFR calc Af Amer: >60 mL/min (ref >60)
GFR calc non Af Amer: >60 mL/min (ref >60)
Glucose, Bld: 320 mg/dL — ABNORMAL HIGH (ref 70–99)
Potassium: 4.2 mmol/L (ref 3.5–5.1)
Sodium: 134 mmol/L — ABNORMAL LOW (ref 135–145)
Total Bilirubin: 1.1 mg/dL (ref 0.3–1.2)
Total Protein: 5.8 g/dL — ABNORMAL LOW (ref 6.5–8.1)

## 2020-03-02 LAB — BASIC METABOLIC PANEL
Anion gap: 15 (ref 5–15)
BUN: 5 mg/dL — ABNORMAL LOW (ref 6–20)
CO2: 20 mmol/L — ABNORMAL LOW (ref 22–32)
Calcium: 8.9 mg/dL (ref 8.9–10.3)
Chloride: 101 mmol/L (ref 98–111)
Creatinine, Ser: 0.76 mg/dL (ref 0.61–1.24)
GFR calc Af Amer: >60 mL/min (ref >60)
GFR calc non Af Amer: >60 mL/min (ref >60)
Glucose, Bld: 225 mg/dL — ABNORMAL HIGH (ref 70–99)
Potassium: 4.1 mmol/L (ref 3.5–5.1)
Sodium: 136 mmol/L (ref 135–145)

## 2020-03-02 LAB — CBC
HCT: 41.2 % (ref 39.0–52.0)
Hemoglobin: 14.6 g/dL (ref 13.0–17.0)
MCH: 30.3 pg (ref 26.0–34.0)
MCHC: 35.4 g/dL (ref 30.0–36.0)
MCV: 85.5 fL (ref 80.0–100.0)
Platelets: 204 10*3/uL (ref 150–400)
RBC: 4.82 MIL/uL (ref 4.22–5.81)
RDW: 12.3 % (ref 11.5–15.5)
WBC: 6.3 10*3/uL (ref 4.0–10.5)
nRBC: 0 % (ref 0.0–0.2)

## 2020-03-02 LAB — CBG MONITORING, ED
Glucose-Capillary: 380 mg/dL — ABNORMAL HIGH (ref 70–99)
Glucose-Capillary: 229 mg/dL — ABNORMAL HIGH (ref 70–99)
Glucose-Capillary: 219 mg/dL — ABNORMAL HIGH (ref 70–99)

## 2020-03-02 LAB — LIPASE, BLOOD: Lipase: 19 U/L (ref 11–51)

## 2020-03-02 LAB — SARS CORONAVIRUS 2 BY RT PCR (HOSPITAL ORDER, PERFORMED IN ~~LOC~~ HOSPITAL LAB): SARS Coronavirus 2: NEGATIVE

## 2020-03-02 LAB — ETHANOL: Alcohol, Ethyl (B): 96 mg/dL — ABNORMAL HIGH (ref <10)

## 2020-03-02 MED ORDER — METOCLOPRAMIDE HCL 5 MG/ML IJ SOLN
10.0000 mg | Freq: Once | INTRAMUSCULAR | Status: AC
  Administered 2020-03-02: 10 mg via INTRAVENOUS
  Filled 2020-03-02: qty 2

## 2020-03-02 MED ORDER — SODIUM CHLORIDE 0.9 % IV BOLUS (SEPSIS)
1000.0000 mL | Freq: Once | INTRAVENOUS | Status: AC
  Administered 2020-03-02: 1000 mL via INTRAVENOUS

## 2020-03-02 MED ORDER — ALUM & MAG HYDROXIDE-SIMETH 200-200-20 MG/5ML PO SUSP: 30.0000 mL | Freq: Once | ORAL | Status: DC

## 2020-03-02 MED ORDER — LACTATED RINGERS IV BOLUS
1000.0000 mL | Freq: Once | INTRAVENOUS | Status: AC
  Administered 2020-03-02: 1000 mL via INTRAVENOUS

## 2020-03-02 MED ORDER — PANTOPRAZOLE SODIUM 40 MG IV SOLR
40.0000 mg | Freq: Once | INTRAVENOUS | Status: AC
  Administered 2020-03-02: 40 mg via INTRAVENOUS
  Filled 2020-03-02: qty 40

## 2020-03-02 MED ORDER — ALUM & MAG HYDROXIDE-SIMETH 200-200-20 MG/5ML PO SUSP
30.0000 mL | Freq: Once | ORAL | Status: AC
  Administered 2020-03-02: 30 mL via ORAL
  Filled 2020-03-02: qty 30

## 2020-03-02 MED ORDER — FENTANYL CITRATE (PF) 100 MCG/2ML IJ SOLN
100.0000 ug | Freq: Once | INTRAMUSCULAR | Status: AC
  Administered 2020-03-02: 100 ug via INTRAVENOUS
  Filled 2020-03-02: qty 2

## 2020-03-02 MED ORDER — FAMOTIDINE IN NACL 20-0.9 MG/50ML-% IV SOLN
20.0000 mg | Freq: Once | INTRAVENOUS | Status: AC
  Administered 2020-03-02: 20 mg via INTRAVENOUS
  Filled 2020-03-02: qty 50

## 2020-03-02 MED ORDER — ONDANSETRON HCL 4 MG/2ML IJ SOLN
4.0000 mg | Freq: Once | INTRAMUSCULAR | Status: AC
  Administered 2020-03-02: 4 mg via INTRAVENOUS
  Filled 2020-03-02: qty 2

## 2020-03-02 NOTE — ED Notes (Signed)
Checked blood sugar it was 229 notified RN of blood sugar

## 2020-03-02 NOTE — ED Notes (Signed)
Hooked patient back up to the monitor after the bathroom patient is resting with call bell in reach 

## 2020-03-02 NOTE — Discharge Instructions (Addendum)
It was our pleasure to provide your ER care today - we hope that you feel better.  Rest. Drink plenty of fluids. Avoid alcohol use. Also note that increasingly we are seeing marijuana as a cause of a recurrent upper abdominal pain and vomiting syndrome - avoiding use may help prevent these type of symptoms.   Take protonix. You may also try pepcid or maalox for symptom relief.   For diabetes, take your medication as prescribed. Follow diabetic eating plan. Check sugars 4x/day and record values. Follow up with primary care doctor in the coming week.  Return to ER if worse, new symptoms, worsening or severe pain, persistent vomiting, trouble breathing, weak/faint, fevers, or other concern.  You were given medication in th ER - no driving for the next 8 hours.

## 2020-03-02 NOTE — ED Provider Notes (Signed)
[facility] EMERGENCY DEPARTMENT Provider Note   CSN: [id number] Arrival date & time: 03/02/20  0422     History Chief Complaint - vomiting  Brent Costa is a 29 y.o. male.  The history is provided by the patient.  Emesis Severity:  Severe Timing:  Intermittent Progression:  Worsening Chronicity:  New Relieved by:  Nothing Worsened by:  Nothing Associated symptoms: abdominal pain   Patient with history of chronic pancreatitis, hypertension, diabetes, alcohol abuse presents with vomiting and pain.  Patient reports that he was drinking alcohol tonight.  He began having upper abdominal pain similar to prior episodes of pancreatitis and vomiting.  He reports a small amount of blood in the vomit.  No chest pain or shortness of breath.     Past Medical History:  Diagnosis Date  . Chronic bronchitis (HCC)    "get it about q year" (11/20/2013)  . Chronic bronchitis (HCC)   . Chronic pancreatitis (HCC) 10/2015   likely present 11/2013 but no lipase at that time.  due to elevated triglycerides and ETOH  . GERD (gastroesophageal reflux disease)   . Hepatic steatosis 11/2013   seen on CT, LFTs normal.  . Hypertension   . Hypertriglyceridemia   . Microalbuminuria   . Molluscum contagiosum 06/2015   upper extremities.   . Obesity   . Type II diabetes mellitus (HCC) dx'd 11/2013   oral agents and insulin    Patient Active Problem List   Diagnosis Date Noted  . Moderate episode of recurrent major depressive disorder (HCC) 11/22/2017  . Microalbuminuria due to type 2 diabetes mellitus (HCC) 11/22/2017  . Tobacco dependence 03/11/2017  . Positive depression screening 03/11/2017  . Abnormal CT of the abdomen   . Gastroparesis   . Obesity (BMI 30-39.9) 11/15/2015  . Chronic pancreatitis (HCC) 11/13/2015  . 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 Essential hypertension 10/31/2015  . Gastroesophageal reflux disease without esophagitis 10/31/2015  . Molluscum  contagiosum 10/16/2015  . Hypertriglyceridemia 06/25/2015  . Type 2 diabetes mellitus with diabetic neuropathy, unspecified (HCC) 01/11/2014    Past Surgical History:  Procedure Laterality Date  . ESOPHAGOGASTRODUODENOSCOPY N/A 11/21/2013   Procedure: ESOPHAGOGASTRODUODENOSCOPY (EGD);  Surgeon: Graylin Shiver, MD;  Location: [facility] ENDOSCOPY;  Service: Endoscopy;  Laterality: N/A;  . TONSILLECTOMY AND ADENOIDECTOMY  ~ 1999       Family History  Problem Relation Age of Onset  . Diabetes Mellitus II Father   . Heart failure Father   . Asthma Mother   . Diabetes Mellitus I Sister     Social History   Tobacco Use  . Smoking status: Current Some Day Smoker    Packs/day: 1.00    Years: 3.00    Pack years: 3.00    Types: Cigarettes    Last attempt to quit: 12/11/2013    Years since quitting: 6.2  . Smokeless tobacco: Never Used  Substance Use Topics  . Alcohol use: Yes    Comment: occasionally   . Drug use: Not Currently    Frequency: 2.0 times per week    Types: Marijuana    Comment: 11/27/2015 "used to use marijuana; used it last  in ~ 2014"    Home Medications Prior to Admission medications   Medication Sig Start Date End Date Taking? Authorizing Provider  acetaminophen (TYLENOL) 500 MG tablet Take 500 mg by mouth every 6 (six) hours as needed for mild pain or moderate pain.    [provider]  albuterol (PROVENTIL HFA;VENTOLIN HFA)  108 (90 Base) MCG/ACT inhaler Inhale 1-2 puffs into the lungs every 6 (six) hours as needed for wheezing or shortness of breath. 01/11/19   Mardella Layman, MD  cyclobenzaprine (FLEXERIL) 10 MG tablet Take 1 tablet (10 mg total) by mouth 2 (two) times daily as needed for muscle spasms. 07/31/19   Burgess Amor, PA-C  ibuprofen (ADVIL) 600 MG tablet Take 1 tablet (600 mg total) by mouth every 6 (six) hours as needed. 07/31/19   Burgess Amor, PA-C  insulin aspart (NOVOLOG) 100 UNIT/ML injection Inject 5-10 Units into the skin See admin instructions.  Inject 5-10 units subcutaneously three times daily after meals - CBG <200 5 units, >200 10 units    [provider]  Insulin Glargine (LANTUS) 100 UNIT/ML Solostar Pen Inject 20 Units into the skin daily at 10 pm. Patient not taking: Reported on 07/30/2019 12/20/17   Marcine Matar, MD  metFORMIN (GLUCOPHAGE) 500 MG tablet Take 1 tablet (500 mg total) by mouth 2 (two) times daily with a meal. 07/31/19   Idol, Raynelle Fanning, PA-C  ondansetron (ZOFRAN) 4 MG tablet Take 1 tablet (4 mg total) by mouth every 6 (six) hours as needed. 02/27/20   Dione Booze, MD  pantoprazole (PROTONIX) 20 MG tablet Take 1 tablet (20 mg total) by mouth daily. 10/12/19 02/27/20  Tanda Rockers, PA-C    Allergies    Patient has no known allergies.  Review of Systems   Review of Systems  Constitutional: Positive for diaphoresis.  Respiratory: Negative for shortness of breath.   Gastrointestinal: Positive for abdominal pain and vomiting.  All other systems reviewed and are negative.   Physical Exam Updated Vital Signs BP 140/79   Pulse (!) 140   Temp 98.8 F (37.1 C) (Oral)   Resp (!) 27   Ht 1.753 m (5\' 9" )   Wt 113.4 kg   SpO2 96%   BMI 36.92 kg/m   Physical Exam CONSTITUTIONAL: Well developed, diaphoretic HEAD: Normocephalic/atraumatic EYES: EOMI/PERRL, no icterus ENMT: Mucous membranes moist NECK: supple no meningeal signs SPINE/BACK:entire spine nontender CV: S1/S2 noted, tachycardic LUNGS: Lungs are clear to auscultation bilaterally, no apparent distress ABDOMEN: soft, mild left upper quadrant tenderness, no rebound or guarding, bowel sounds noted throughout abdomen GU:no cva tenderness NEURO: Pt is awake/alert/appropriate, moves all extremitiesx4.  No facial droop.  EXTREMITIES: pulses normal/equal, full ROM SKIN: warm, color normal PSYCH: no abnormalities of mood noted, alert and oriented to situation  ED Results / Procedures / Treatments   Labs (all labs ordered are listed, but only  abnormal results are displayed) Labs Reviewed  COMPREHENSIVE METABOLIC PANEL - Abnormal; Notable for the following components:      Result Value   Sodium 134 (*)    CO2 17 (*)    Glucose, Bld 320 (*)    Calcium 8.5 (*)    Total Protein 5.8 (*)    Anion gap 16 (*)    All other components within normal limits  ETHANOL - Abnormal; Notable for the following components:   Alcohol, Ethyl (B) 96 (*)    All other components within normal limits  CBG MONITORING, ED - Abnormal; Notable for the following components:   Glucose-Capillary 380 (*)    All other components within normal limits  CBG MONITORING, ED - Abnormal; Notable for the following components:   Glucose-Capillary 229 (*)    All other components within normal limits  SARS CORONAVIRUS 2 BY RT PCR (HOSPITAL ORDER, PERFORMED IN [facility])  LIPASE, BLOOD  CBC  BASIC METABOLIC PANEL    EKG ED ECG REPORT   Date: 03/02/2020 0426  Rate: 143  Rhythm: sinus tachycardia  QRS Axis: normal  Intervals: normal  ST/T Wave abnormalities: nonspecific ST changes  Conduction Disutrbances:none   I have personally reviewed the EKG tracing and agree with the computerized printout as noted.  Radiology No results found.  Procedures .Critical Care Performed by: Ripley Fraise, MD Authorized by: Ripley Fraise, MD   Critical care provider statement:    Critical care time (minutes):  33   Critical care start time:  03/02/2020 5:57 AM   Critical care end time:  03/02/2020 6:30 AM   Critical care time was exclusive of:  Separately billable procedures and treating other patients   Critical care was necessary to treat or prevent imminent or life-threatening deterioration of the following conditions:  Cardiac failure, endocrine crisis and dehydration   Critical care was time spent personally by me on the following activities:  Development of treatment plan with patient or surrogate, examination of patient, re-evaluation of  patient's condition, review of old charts, pulse oximetry, ordering and review of laboratory studies and ordering and performing treatments and interventions   I assumed direction of critical care for this patient from another provider in my specialty: no      Medications Ordered in ED Medications  alum & mag hydroxide-simeth (MAALOX/MYLANTA) 200-200-20 MG/5ML suspension 30 mL (has no administration in time range)  fentaNYL (SUBLIMAZE) injection 100 mcg (has no administration in time range)  sodium chloride 0.9 % bolus 1,000 mL (0 mLs Intravenous Stopped 03/02/20 0605)  ondansetron (ZOFRAN) injection 4 mg (4 mg Intravenous Given 03/02/20 0531)  pantoprazole (PROTONIX) injection 40 mg (40 mg Intravenous Given 03/02/20 0650)  lactated ringers bolus 1,000 mL (1,000 mLs Intravenous New Bag/Given 03/02/20 4098)    ED Course  I have reviewed the triage vital signs and the nursing notes.  Pertinent labs & imaging results that were available during my care of the patient were reviewed by me and considered in my medical decision making (see chart for details).    MDM Rules/Calculators/A&P                       This patient presents to the ED for concern of vomiting, this involves an extensive number of treatment options, and is a complaint that carries with it a high risk of complications and morbidity.  The differential diagnosis includes pancreatitis, bowel obstruction, hyperglycemia, dehydration   Lab Tests:   I Ordered, reviewed, and interpreted labs, which included electrolytes complete blood count  Medicines ordered:   I ordered medication IV fluids and Zofran for nausea and dehydration   Additional history obtained:    Previous records obtained and reviewed    7:49 AM Patient presented for recurrent abdominal pain and vomiting after drinking alcohol.  On arrival patient was tachycardic to the 140s and diaphoretic.  He had minimal abdominal tenderness on exam.  He was given IV  fluids with improvement in his heart rate.  He is now reporting pain in his upper abdomen into his chest that feels like heartburn and previous pancreatitis.  His lipase does not indicate acute pancreatitis.  His labs do reveal dehydration/hyperglycemia. Mild anion gap noted but lower suspicion for DKA   He was seen in the ER recently for similar presentation and improved with GI cocktail pain medications.  His troponins at that time were negative He has no acute EKG changes this  time 7:50 AM Plan at signout to dr Denton Lank - f/u on repeat labs If no improvement or he continues to vomit he will need admission  Final Clinical Impression(s) / ED Diagnoses Final diagnoses:  Dehydration  Hyperglycemia  Nausea and vomiting, intractability of vomiting not specified, unspecified vomiting type    Rx / DC Orders ED Discharge Orders    None       Zadie Rhine, MD 03/02/20 [phone]

## 2020-03-02 NOTE — ED Notes (Signed)
Checked patient cbg it was 219 notified RN of blood sugar patient is resting with call bell in reach 

## 2020-03-02 NOTE — ED Notes (Signed)
PT states pain improved with burping.

## 2020-03-02 NOTE — ED Provider Notes (Signed)
Signed out by Dr Bebe Shaggy that patient is improved from prior, and that repeat bmet pending, and if patient continues to feel better, to d/c to home then.   Repeat bmet sl improved as compared to prior.   Hr in 90s. Abd soft non tender. Po fluids/food.  Recheck abd soft nt. No new c/o or pain. No cp or abd pain. No sob.   Patient continues to feel improved, is tolerating po, and currently appears stable for d/c.   rec pcp f/u.  Return precautions provided.      Cathren Laine, MD 03/02/20 1048

## 2020-03-02 NOTE — ED Provider Notes (Signed)
ED ECG REPORT   Date: 03/02/2020 0756am  Rate: 104  Rhythm: sinus tachycardia  QRS Axis: normal  Intervals: normal  ST/T Wave abnormalities: nonspecific T wave changes  Conduction Disutrbances:none  Narrative Interpretation:   Old EKG Reviewed: unchanged  I have personally reviewed the EKG tracing and agree with the computerized printout as noted.    Zadie Rhine, MD 03/02/20 [phone]

## 2020-03-02 NOTE — ED Triage Notes (Signed)
Pt came in [facility] from home complaining of Left abd pain. Pt has a hx of pancreatitis and diabetes that is not controlled well. Pt admits to ETOH tonight. EMS report marijuana on the pt table, however, pt denies use of it tonight. Pt is lethargic. 140HR, 140/76, 439CBG, 500NS given by EMS in [facility] IV

## 2021-01-06 ENCOUNTER — Ambulatory Visit (INDEPENDENT_AMBULATORY_CARE_PROVIDER_SITE_OTHER): Payer: Self-pay

## 2021-01-06 ENCOUNTER — Ambulatory Visit (HOSPITAL_COMMUNITY)
Admission: EM | Admit: 2021-01-06 | Discharge: 2021-01-06 | Disposition: A | Discharge status: Home / Self Care | Payer: Self-pay | Attending: Physician Assistant | Admitting: Physician Assistant

## 2021-01-06 ENCOUNTER — Other Ambulatory Visit: Payer: Self-pay

## 2021-01-06 ENCOUNTER — Encounter (HOSPITAL_COMMUNITY): Payer: Self-pay

## 2021-01-06 DIAGNOSIS — M5412 Radiculopathy, cervical region: Secondary | ICD-10-CM

## 2021-01-06 DIAGNOSIS — M542 Cervicalgia: Secondary | ICD-10-CM

## 2021-01-06 DIAGNOSIS — M5410 Radiculopathy, site unspecified: Secondary | ICD-10-CM

## 2021-01-06 MED ORDER — NAPROXEN 375 MG PO TABS: 375.0000 mg | ORAL_TABLET | Freq: Two times a day (BID) | ORAL | 0 refills | Status: AC

## 2021-01-06 MED ORDER — TIZANIDINE HCL 4 MG PO CAPS: 4.0000 mg | ORAL_CAPSULE | Freq: Two times a day (BID) | ORAL | 0 refills | Status: AC | PRN

## 2021-01-06 NOTE — ED Notes (Signed)
Provider Erin Raspet notified of pt's arm numbness.

## 2021-01-06 NOTE — Discharge Instructions (Signed)
Do not take NSAIDs with naprosyn (such as aspirin, ibuprofen, naproxen). You can take tylenol. Zanaflex will make you sleepy so do not drive or drink alcohol with this medication. You need to see a PCP. If anything worsens (increased pain, weakness, increased numbness) you need to be seen immediately.
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
## 2021-09-07 ENCOUNTER — Emergency Department (HOSPITAL_BASED_OUTPATIENT_CLINIC_OR_DEPARTMENT_OTHER)
Admission: EM | Admit: 2021-09-07 | Discharge: 2021-09-07 | Disposition: A | Discharge status: Home / Self Care | Payer: Medicaid Other | Attending: Emergency Medicine | Admitting: Emergency Medicine

## 2021-09-07 ENCOUNTER — Emergency Department (HOSPITAL_BASED_OUTPATIENT_CLINIC_OR_DEPARTMENT_OTHER): Payer: Medicaid Other

## 2021-09-07 ENCOUNTER — Encounter (HOSPITAL_BASED_OUTPATIENT_CLINIC_OR_DEPARTMENT_OTHER): Payer: Self-pay

## 2021-09-07 ENCOUNTER — Other Ambulatory Visit: Payer: Self-pay

## 2021-09-07 DIAGNOSIS — M79672 Pain in left foot: Secondary | ICD-10-CM | POA: Insufficient documentation

## 2021-09-07 DIAGNOSIS — F17210 Nicotine dependence, cigarettes, uncomplicated: Secondary | ICD-10-CM | POA: Insufficient documentation

## 2021-09-07 DIAGNOSIS — Z794 Long term (current) use of insulin: Secondary | ICD-10-CM | POA: Insufficient documentation

## 2021-09-07 DIAGNOSIS — I10 Essential (primary) hypertension: Secondary | ICD-10-CM | POA: Insufficient documentation

## 2021-09-07 DIAGNOSIS — Z7984 Long term (current) use of oral hypoglycemic drugs: Secondary | ICD-10-CM | POA: Insufficient documentation

## 2021-09-07 DIAGNOSIS — E119 Type 2 diabetes mellitus without complications: Secondary | ICD-10-CM | POA: Insufficient documentation

## 2021-09-07 NOTE — Discharge Instructions (Signed)
No fracture seen on xray. Suspect overuse injury/tendonitis. Wear walking boot for activites, okay to take off for shower/not walking. Follow up with sports medicine. Take 800 mg of motrin every 8 hours as needed for pain. Take 1000 mg of tylenol every 6 hours as need for pain.

## 2021-09-07 NOTE — ED Provider Notes (Signed)
[facility] EMERGENCY DEPARTMENT Provider Note   CSN: [id number] Arrival date & time: 09/07/21  1126     History Chief Complaint  Patient presents with   Foot Pain    Brent Costa is a 30 y.o. male.  The history is provided by the patient.  Foot Pain This is a new problem. The current episode started more than 2 days ago. The problem occurs daily. The problem has not changed since onset.The symptoms are aggravated by walking. Nothing relieves the symptoms. He has tried nothing for the symptoms. The treatment provided no relief.      Past Medical History:  Diagnosis Date   Chronic bronchitis (HCC)    "get it about q year" (11/20/2013)   Chronic bronchitis (HCC)    Chronic pancreatitis (HCC) 10/2015   likely present 11/2013 but no lipase at that time.  due to elevated triglycerides and ETOH   GERD (gastroesophageal reflux disease)    Hepatic steatosis 11/2013   seen on CT, LFTs normal.   Hypertension    Hypertriglyceridemia    Microalbuminuria    Molluscum contagiosum 06/2015   upper extremities.    Obesity    Type II diabetes mellitus (HCC) dx'd 11/2013   oral agents and insulin    Patient Active Problem List   Diagnosis Date Noted   Moderate episode of recurrent major depressive disorder (HCC) 11/22/2017   Microalbuminuria due to type 2 diabetes mellitus (HCC) 11/22/2017   Tobacco dependence 03/11/2017   Positive depression screening 03/11/2017   Abnormal CT of the abdomen    Gastroparesis    Obesity (BMI 30-39.9) 11/15/2015   Chronic pancreatitis (HCC) 11/13/2015   Pancreatic pseudocyst 11/13/2015   Essential hypertension 10/31/2015   Gastroesophageal reflux disease without esophagitis 10/31/2015   Molluscum contagiosum 10/16/2015   Hypertriglyceridemia 06/25/2015   Type 2 diabetes mellitus with diabetic neuropathy, unspecified (HCC) 01/11/2014    Past Surgical History:  Procedure Laterality Date   ESOPHAGOGASTRODUODENOSCOPY N/A 11/21/2013    Procedure: ESOPHAGOGASTRODUODENOSCOPY (EGD);  Surgeon: Graylin Shiver, MD;  Location: [facility] ENDOSCOPY;  Service: Endoscopy;  Laterality: N/A;   TONSILLECTOMY AND ADENOIDECTOMY  ~ 1999       Family History  Problem Relation Age of Onset   Diabetes Mellitus II Father    Heart failure Father    Asthma Mother    Diabetes Mellitus I Sister     Social History   Tobacco Use   Smoking status: Some Days    Packs/day: 1.00    Years: 3.00    Pack years: 3.00    Types: Cigarettes    Last attempt to quit: 12/11/2013    Years since quitting: 7.7   Smokeless tobacco: Never  Vaping Use   Vaping Use: Never used  Substance Use Topics   Alcohol use: Yes    Comment: occasionally    Drug use: Not Currently    Frequency: 2.0 times per week    Types: Marijuana    Comment: 11/27/2015 "used to use marijuana; used it last  in ~ 2014"    Home Medications Prior to Admission medications   Medication Sig Start Date End Date Taking? Authorizing Provider  albuterol (PROVENTIL HFA;VENTOLIN HFA) 108 (90 Base) MCG/ACT inhaler Inhale 1-2 puffs into the lungs every 6 (six) hours as needed for wheezing or shortness of breath. 01/11/19   Mardella Layman, MD  ibuprofen (ADVIL) 600 MG tablet Take 1 tablet (600 mg total) by mouth every 6 (six) hours as needed. Patient not taking:  Reported on 03/02/2020 07/31/19   Burgess Amor, PA-C  Insulin Glargine (LANTUS) 100 UNIT/ML Solostar Pen Inject 20 Units into the skin daily at 10 pm. Patient not taking: Reported on 07/30/2019 12/20/17   Marcine Matar, MD  metFORMIN (GLUCOPHAGE) 500 MG tablet Take 1 tablet (500 mg total) by mouth 2 (two) times daily with a meal. Patient not taking: Reported on 03/02/2020 07/31/19   Burgess Amor, PA-C  naproxen (NAPROSYN) 375 MG tablet Take 1 tablet (375 mg total) by mouth 2 (two) times daily. 01/06/21   Raspet, Erin K, PA-C  ondansetron (ZOFRAN) 4 MG tablet Take 1 tablet (4 mg total) by mouth every 6 (six) hours as needed. Patient not taking:  Reported on 03/02/2020 02/27/20   Dione Booze, MD  tiZANidine (ZANAFLEX) 4 MG capsule Take 1 capsule (4 mg total) by mouth 2 (two) times daily as needed for muscle spasms. 01/06/21   Raspet, Noberto Retort, PA-C  pantoprazole (PROTONIX) 20 MG tablet Take 1 tablet (20 mg total) by mouth daily. 10/12/19 02/27/20  Tanda Rockers, PA-C    Allergies    Patient has no known allergies.  Review of Systems   Review of Systems  Musculoskeletal:  Positive for arthralgias and gait problem. Negative for joint swelling.  Skin:  Negative for color change and wound.  Neurological:  Negative for weakness and numbness.   Physical Exam Updated Vital Signs BP (!) 150/94 (BP Location: Right Arm)   Pulse 94   Temp 98.9 F (37.2 C) (Oral)   Resp 16   Ht 5\' 9"  (1.753 m)   Wt 106.6 kg   SpO2 100%   BMI 34.70 kg/m   Physical Exam Constitutional:      General: He is not in acute distress.    Appearance: He is not ill-appearing.  Cardiovascular:     Pulses: Normal pulses.     Heart sounds: Normal heart sounds.  Musculoskeletal:        General: Tenderness present. No swelling or deformity.     Comments: Pain with dorsiflexion of left ankle and TTP to the achilles area/plantar surface of heel   Skin:    Capillary Refill: Capillary refill takes less than 2 seconds.     Findings: No erythema.  Neurological:     General: No focal deficit present.     Mental Status: He is alert.     Sensory: No sensory deficit.     Motor: No weakness.    ED Results / Procedures / Treatments   Labs (all labs ordered are listed, but only abnormal results are displayed) Labs Reviewed - No data to display  EKG None  Radiology DG Foot Complete Left  Result Date: 09/07/2021 CLINICAL DATA:  Pain, left Achilles an into foot, no known injury EXAM: LEFT FOOT - COMPLETE 3+ VIEW COMPARISON:  None. FINDINGS: There is no evidence of fracture or dislocation. There is no evidence of arthropathy or other focal bone abnormality. Soft  tissues are unremarkable. IMPRESSION: No fracture or dislocation of the left foot. Joint spaces are preserved. No radiographic findings to explain pain. Electronically Signed   By: 14/01/2021 M.D.   On: 09/07/2021 12:11    Procedures Procedures   Medications Ordered in ED Medications - No data to display  ED Course  I have reviewed the triage vital signs and the nursing notes.  Pertinent labs & imaging results that were available during my care of the patient were reviewed by me and considered in my medical decision  making (see chart for details).    MDM Rules/Calculators/A&P                           Benuel A Witter is a 30 year old male who presents the ED with left heel pain.  X-ray negative for fracture.  Overall appears to maybe have overuse injury/may be Achilles tendinitis.  Neurovascularly neuromuscularly intact.  We will have him placed in a walking boot.  We will have him follow-up with sports medicine.  Recommend Tylenol, ibuprofen, light work at work.  This chart was dictated using voice recognition software.  Despite best efforts to proofread,  errors can occur which can change the documentation meaning.   Final Clinical Impression(s) / ED Diagnoses Final diagnoses:  Foot pain, left    Rx / DC Orders ED Discharge Orders     None        Virgina Norfolk, DO 09/07/21 1218

## 2021-09-07 NOTE — ED Triage Notes (Signed)
C/o pain to left achilles and into foot. Denies known injury.

## 2021-09-11 ENCOUNTER — Ambulatory Visit: Payer: Self-pay

## 2021-09-11 ENCOUNTER — Encounter: Payer: Self-pay | Admitting: Family Medicine

## 2021-09-11 ENCOUNTER — Ambulatory Visit (INDEPENDENT_AMBULATORY_CARE_PROVIDER_SITE_OTHER): Payer: Self-pay | Admitting: Family Medicine

## 2021-09-11 VITALS — BP 140/98 | Ht 69.0 in | Wt 235.0 lb

## 2021-09-11 DIAGNOSIS — M7660 Achilles tendinitis, unspecified leg: Secondary | ICD-10-CM

## 2021-09-11 DIAGNOSIS — M6788 Other specified disorders of synovium and tendon, other site: Secondary | ICD-10-CM | POA: Insufficient documentation

## 2021-09-11 NOTE — Progress Notes (Signed)
Brent Costa - 30 y.o. male MRN [id number]  Date of birth: 02-24-1991  SUBJECTIVE:  Including CC & ROS.  No chief complaint on file.   Brent Costa is a 30 y.o. male that is presenting with acute left Achilles pain.  The pain has been ongoing for the past few days.  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  No specific injury.  Independent review of the left foot x-ray from 12/4 shows no acute changes.   Review of Systems See HPI   HISTORY: Past Medical, Surgical, Social, and Family History Reviewed & Updated per EMR.   Pertinent Historical Findings include:  Past Medical History:  Diagnosis Date   Chronic bronchitis (HCC)    "get it about q year" (11/20/2013)   Chronic bronchitis (HCC)    Chronic pancreatitis (HCC) 10/2015   likely present 11/2013 but no lipase at that time.  due to elevated triglycerides and ETOH   GERD (gastroesophageal reflux disease)    Hepatic steatosis 11/2013   seen on CT, LFTs normal.   Hypertension    Hypertriglyceridemia    Microalbuminuria    Molluscum contagiosum 06/2015   upper extremities.    Obesity    Type II diabetes mellitus (HCC) dx'd 11/2013   oral agents and insulin    Past Surgical History:  Procedure Laterality Date   ESOPHAGOGASTRODUODENOSCOPY N/A 11/21/2013   Procedure: ESOPHAGOGASTRODUODENOSCOPY (EGD);  Surgeon: Graylin Shiver, MD;  Location: [facility] ENDOSCOPY;  Service: Endoscopy;  Laterality: N/A;   TONSILLECTOMY AND ADENOIDECTOMY  ~ 1999    Family History  Problem Relation Age of Onset   Diabetes Mellitus II Father    Heart failure Father    Asthma Mother    Diabetes Mellitus I Sister     Social History   Socioeconomic History   Marital status: Single    Spouse name: Not on file   Number of children: Not on file   Years of education: Not on file   Highest education level: Not on file  Occupational History   Not on file  Tobacco Use   Smoking status: Some Days    Packs/day: 1.00    Years: 3.00     Pack years: 3.00    Types: Cigarettes    Last attempt to quit: 12/11/2013    Years since quitting: 7.7   Smokeless tobacco: Never  Vaping Use   Vaping Use: Never used  Substance and Sexual Activity   Alcohol use: Yes    Comment: occasionally    Drug use: Not Currently    Frequency: 2.0 times per week    Types: Marijuana    Comment: 11/27/2015 "used to use marijuana; used it last  in ~ 2014"   Sexual activity: Yes    Birth control/protection: Condom  Other Topics Concern   Not on file  Social History Narrative   Not on file   Social Determinants of Health   Financial Resource Strain: Not on file  Food Insecurity: Not on file  Transportation Needs: Not on file  Physical Activity: Not on file  Stress: Not on file  Social Connections: Not on file  Intimate Partner Violence: Not on file     PHYSICAL EXAM:  VS: BP (!) 140/98 (BP Location: Left Arm, Patient Position: Sitting)   Ht 5\' 9"  (1.753 m)   Wt 235 lb (106.6 kg)   BMI 34.70 kg/m  Physical Exam Gen: NAD, alert, cooperative with exam, well-appearing  Limited ultrasound: Left ankle:  Thickening appreciated at the mid substance of the Achilles. No increased hyperemia. Normal insertion. Normal peroneal tendons and insertion of the peroneal brevis. Normal lateral malleolus  Summary: Findings most consistent with Achilles tendinosis  Ultrasound and interpretation by Clare Gandy, MD     ASSESSMENT & PLAN:   Achilles tendinosis Symptoms seem most consistent with Achilles tendinosis given the thickening on exam. -Counseled on home exercise therapy and supportive care. -Heel lift for cam walker. -Could consider physical therapy or injection.

## 2021-09-11 NOTE — Assessment & Plan Note (Signed)
Symptoms seem most consistent with Achilles tendinosis given the thickening on exam. -Counseled on home exercise therapy and supportive care. -Heel lift for cam walker. -Could consider physical therapy or injection.

## 2021-09-11 NOTE — Patient Instructions (Signed)
Nice to meet you Please use ice as needed  Please continue the boot   Please send me a message in MyChart with any questions or updates.  Please see me back in 2 weeks.   --Dr. Jordan Likes

## 2021-09-25 ENCOUNTER — Encounter: Payer: Self-pay | Admitting: Family Medicine

## 2021-09-25 ENCOUNTER — Ambulatory Visit (INDEPENDENT_AMBULATORY_CARE_PROVIDER_SITE_OTHER): Payer: Self-pay | Admitting: Family Medicine

## 2021-09-25 VITALS — BP 122/88 | Ht 69.0 in | Wt 235.0 lb

## 2021-09-25 DIAGNOSIS — M6788 Other specified disorders of synovium and tendon, other site: Secondary | ICD-10-CM

## 2021-09-25 NOTE — Assessment & Plan Note (Signed)
Has improved with measures thus far.  -Counseled on home exercise therapy and supportive care. -Counseled on weaning out of the cam walker. -Could consider shockwave or physical therapy. -Provided work note.

## 2021-09-25 NOTE — Progress Notes (Signed)
Brent Costa - 30 y.o. male MRN [id number]  Date of birth: [birth year]  SUBJECTIVE:  Including CC & ROS.  No chief complaint on file.   Brent Costa is a 30 y.o. male that is following up for his Achilles pain.  Has been doing well within the CAM walker.    Review of Systems See HPI   HISTORY: Past Medical, Surgical, Social, and Family History Reviewed & Updated per EMR.   Pertinent Historical Findings include:  Past Medical History:  Diagnosis Date   Chronic bronchitis (HCC)    "get it about q year" (11/20/2013)   Chronic bronchitis (HCC)    Chronic pancreatitis (HCC) 10/2015   likely present 11/2013 but no lipase at that time.  due to elevated triglycerides and ETOH   GERD (gastroesophageal reflux disease)    Hepatic steatosis 11/2013   seen on CT, LFTs normal.   Hypertension    Hypertriglyceridemia    Microalbuminuria    Molluscum contagiosum 06/2015   upper extremities.    Obesity    Type II diabetes mellitus (HCC) dx'd 11/2013   oral agents and insulin    Past Surgical History:  Procedure Laterality Date   ESOPHAGOGASTRODUODENOSCOPY N/A 11/21/2013   Procedure: ESOPHAGOGASTRODUODENOSCOPY (EGD);  Surgeon: Graylin Shiver, MD;  Location: [facility] ENDOSCOPY;  Service: Endoscopy;  Laterality: N/A;   TONSILLECTOMY AND ADENOIDECTOMY  ~ 1999    Family History  Problem Relation Age of Onset   Diabetes Mellitus II Father    Heart failure Father    Asthma Mother    Diabetes Mellitus I Sister     Social History   Socioeconomic History   Marital status: Single    Spouse name: Not on file   Number of children: Not on file   Years of education: Not on file   Highest education level: Not on file  Occupational History   Not on file  Tobacco Use   Smoking status: Some Days    Packs/day: 1.00    Years: 3.00    Pack years: 3.00    Types: Cigarettes    Last attempt to quit: 12/11/2013    Years since quitting: 7.7   Smokeless tobacco: Never  Vaping Use   Vaping Use: Never  used  Substance and Sexual Activity   Alcohol use: Yes    Comment: occasionally    Drug use: Not Currently    Frequency: 2.0 times per week    Types: Marijuana    Comment: 11/27/2015 "used to use marijuana; used it last  in ~ 2014"   Sexual activity: Yes    Birth control/protection: Condom  Other Topics Concern   Not on file  Social History Narrative   Not on file   Social Determinants of Health   Financial Resource Strain: Not on file  Food Insecurity: Not on file  Transportation Needs: Not on file  Physical Activity: Not on file  Stress: Not on file  Social Connections: Not on file  Intimate Partner Violence: Not on file     PHYSICAL EXAM:  VS: BP 122/88 (BP Location: Left Arm, Patient Position: Sitting)    Ht 5\' 9"  (1.753 m)    Wt 235 lb (106.6 kg)    BMI 34.70 kg/m  Physical Exam Gen: NAD, alert, cooperative with exam, well-appearing      ASSESSMENT & PLAN:   Achilles tendinosis Has improved with measures thus far.  -Counseled on home exercise therapy and supportive care. -Counseled on weaning out of the  cam walker. -Could consider shockwave or physical therapy. -Provided work note.

## 2021-09-25 NOTE — Patient Instructions (Signed)
Good to see you Please try to wean out of the boot  Please try the exercises   Please send me a message in MyChart with any questions or updates.  Please see me back in 4 weeks.   --Dr. Jordan Likes

## 2021-10-27 ENCOUNTER — Ambulatory Visit: Payer: Medicaid Other | Admitting: Family Medicine

## 2021-12-16 ENCOUNTER — Ambulatory Visit (HOSPITAL_COMMUNITY)
Admission: EM | Admit: 2021-12-16 | Discharge: 2021-12-16 | Disposition: A | Discharge status: Home / Self Care | Payer: Medicaid Other | Attending: Nurse Practitioner | Admitting: Nurse Practitioner

## 2021-12-16 ENCOUNTER — Encounter (HOSPITAL_COMMUNITY): Payer: Self-pay

## 2021-12-16 ENCOUNTER — Other Ambulatory Visit: Payer: Self-pay

## 2021-12-16 DIAGNOSIS — K861 Other chronic pancreatitis: Secondary | ICD-10-CM | POA: Insufficient documentation

## 2021-12-16 DIAGNOSIS — K5900 Constipation, unspecified: Secondary | ICD-10-CM | POA: Insufficient documentation

## 2021-12-16 DIAGNOSIS — E1165 Type 2 diabetes mellitus with hyperglycemia: Secondary | ICD-10-CM | POA: Insufficient documentation

## 2021-12-16 DIAGNOSIS — E781 Pure hyperglyceridemia: Secondary | ICD-10-CM | POA: Insufficient documentation

## 2021-12-16 DIAGNOSIS — Z20822 Contact with and (suspected) exposure to covid-19: Secondary | ICD-10-CM | POA: Insufficient documentation

## 2021-12-16 DIAGNOSIS — E1143 Type 2 diabetes mellitus with diabetic autonomic (poly)neuropathy: Secondary | ICD-10-CM | POA: Insufficient documentation

## 2021-12-16 DIAGNOSIS — K219 Gastro-esophageal reflux disease without esophagitis: Secondary | ICD-10-CM | POA: Insufficient documentation

## 2021-12-16 DIAGNOSIS — R10817 Generalized abdominal tenderness: Secondary | ICD-10-CM | POA: Insufficient documentation

## 2021-12-16 DIAGNOSIS — R112 Nausea with vomiting, unspecified: Secondary | ICD-10-CM | POA: Insufficient documentation

## 2021-12-16 DIAGNOSIS — R739 Hyperglycemia, unspecified: Secondary | ICD-10-CM

## 2021-12-16 DIAGNOSIS — I10 Essential (primary) hypertension: Secondary | ICD-10-CM | POA: Insufficient documentation

## 2021-12-16 LAB — SARS CORONAVIRUS 2 (TAT 6-24 HRS): SARS Coronavirus 2: NEGATIVE

## 2021-12-16 LAB — POC INFLUENZA A AND B ANTIGEN (URGENT CARE ONLY)
INFLUENZA A ANTIGEN, POC: NEGATIVE
INFLUENZA B ANTIGEN, POC: NEGATIVE

## 2021-12-16 LAB — CBG MONITORING, ED: Glucose-Capillary: 303 mg/dL — ABNORMAL HIGH (ref 70–99)

## 2021-12-16 MED ORDER — PANTOPRAZOLE SODIUM 40 MG PO TBEC: 40.0000 mg | DELAYED_RELEASE_TABLET | Freq: Every day | ORAL | 0 refills | Status: AC

## 2021-12-16 MED ORDER — METFORMIN HCL 500 MG PO TABS: 500.0000 mg | ORAL_TABLET | Freq: Two times a day (BID) | ORAL | 1 refills | Status: AC

## 2021-12-16 MED ORDER — ONDANSETRON HCL 4 MG PO TABS: 4.0000 mg | ORAL_TABLET | Freq: Three times a day (TID) | ORAL | 0 refills | Status: AC | PRN

## 2021-12-16 MED ORDER — INSULIN GLARGINE 100 UNIT/ML ~~LOC~~ SOLN: 20.0000 [IU] | Freq: Every day | SUBCUTANEOUS | 1 refills | Status: AC

## 2021-12-16 NOTE — ED Provider Notes (Signed)
?[facility] ? ? ? ?CSN: [id number] ?Arrival date & time: 12/16/21  1124 ? ? ?  ? ?History   ?Chief Complaint ?Chief Complaint  ?Patient presents with  ? Abdominal Pain  ? Cough  ? ? ?HPI ?Brent Costa is a 31 y.o. male.  ? ?The patient is a 31 year old male who presents with abdominal pain, nausea, vomiting, and cough.  Symptoms have persisted over the past 2 days.  He also reports some constipation since that time.  He states that he feels like he is bloated, and has increased flatulence.  He also complains of chest pain, that he describes as tightness and heartburn.  He reports he vomited 2-3 times over the last 2 days, with 1 day of vomiting only in the morning.  He is also admits to increased urination and increased thirst.  He has not vomited today.  He denies any diarrhea.  He has also had a decreased appetite.  States that after he vomits, he feels weak throughout the day.  He denies fever, chills, headache, sore throat, ear pain.  He also admits to a nonproductive cough.  Has a history of reflux disease that he informed me of.  After reviewing his chart, he does have a history of diabetes, hypertension, triglyceridemia, and chronic pancreatitis.  He informs that he has not seen a physician in the past 3 to 4 years, he is currently not taking any medication for his high blood pressure, diabetes, GERD or his elevated triglycerides.  ? ? ?Abdominal Pain ?Associated symptoms: constipation, cough, fatigue, nausea and vomiting   ?Associated symptoms: no fever, no shortness of breath and no sore throat   ?Cough ?Associated symptoms: no ear pain, no fever, no shortness of breath and no sore throat   ? ?Past Medical History:  ?Diagnosis Date  ? Chronic bronchitis (HCC)   ? "get it about q year" (11/20/2013)  ? Chronic bronchitis (HCC)   ? Chronic pancreatitis (HCC) 10/2015  ? likely present 11/2013 but no lipase at that time.  due to elevated triglycerides and ETOH  ? GERD (gastroesophageal reflux disease)    ? Hepatic steatosis 11/2013  ? seen on CT, LFTs normal.  ? Hypertension   ? Hypertriglyceridemia   ? Microalbuminuria   ? Molluscum contagiosum 06/2015  ? upper extremities.   ? Obesity   ? Type II diabetes mellitus (HCC) dx'd 11/2013  ? oral agents and insulin  ? ? ?Patient Active Problem List  ? Diagnosis Date Noted  ? Achilles tendinosis 09/11/2021  ? Moderate episode of recurrent major depressive disorder (HCC) 11/22/2017  ? Microalbuminuria due to type 2 diabetes mellitus (HCC) 11/22/2017  ? Tobacco dependence 03/11/2017  ? Positive depression screening 03/11/2017  ? Abnormal CT of the abdomen   ? Gastroparesis   ? Obesity (BMI 30-39.9) 11/15/2015  ? Chronic pancreatitis (HCC) 11/13/2015  ? Pancreatic pseudocyst 11/13/2015  ? Essential hypertension 10/31/2015  ? Gastroesophageal reflux disease without esophagitis 10/31/2015  ? Molluscum contagiosum 10/16/2015  ? Hypertriglyceridemia 06/25/2015  ? Type 2 diabetes mellitus with diabetic neuropathy, unspecified (HCC) 01/11/2014  ? ? ?Past Surgical History:  ?Procedure Laterality Date  ? ESOPHAGOGASTRODUODENOSCOPY N/A 11/21/2013  ? Procedure: ESOPHAGOGASTRODUODENOSCOPY (EGD);  Surgeon: Graylin Shiver, MD;  Location: [facility] ENDOSCOPY;  Service: Endoscopy;  Laterality: N/A;  ? TONSILLECTOMY AND ADENOIDECTOMY  ~ 1999  ? ? ? ? ? ?Home Medications   ? ?Prior to Admission medications   ?Medication Sig Start Date End Date Taking? Authorizing Provider  ?  albuterol (PROVENTIL HFA;VENTOLIN HFA) 108 (90 Base) MCG/ACT inhaler Inhale 1-2 puffs into the lungs every 6 (six) hours as needed for wheezing or shortness of breath. 01/11/19   Mardella LaymanHagler, Brian, MD  ?ibuprofen (ADVIL) 600 MG tablet Take 1 tablet (600 mg total) by mouth every 6 (six) hours as needed. ?Patient not taking: Reported on 03/02/2020 07/31/19   Burgess AmorIdol, Julie, PA-C  ?Insulin Glargine (LANTUS) 100 UNIT/ML Solostar Pen Inject 20 Units into the skin daily at 10 pm. ?Patient not taking: Reported on 07/30/2019 12/20/17   Marcine MatarJohnson,  Deborah B, MD  ?metFORMIN (GLUCOPHAGE) 500 MG tablet Take 1 tablet (500 mg total) by mouth 2 (two) times daily with a meal. ?Patient not taking: Reported on 03/02/2020 07/31/19   Burgess AmorIdol, Julie, PA-C  ?naproxen (NAPROSYN) 375 MG tablet Take 1 tablet (375 mg total) by mouth 2 (two) times daily. 01/06/21   Raspet, Noberto RetortErin K, PA-C  ?ondansetron (ZOFRAN) 4 MG tablet Take 1 tablet (4 mg total) by mouth every 6 (six) hours as needed. ?Patient not taking: Reported on 03/02/2020 02/27/20   Dione BoozeGlick, David, MD  ?tiZANidine (ZANAFLEX) 4 MG capsule Take 1 capsule (4 mg total) by mouth 2 (two) times daily as needed for muscle spasms. 01/06/21   Raspet, Noberto RetortErin K, PA-C  ?pantoprazole (PROTONIX) 20 MG tablet Take 1 tablet (20 mg total) by mouth daily. 10/12/19 02/27/20  Tanda RockersVenter, Margaux, PA-C  ? ? ?Family History ?Family History  ?Problem Relation Age of Onset  ? Diabetes Mellitus II Father   ? Heart failure Father   ? Asthma Mother   ? Diabetes Mellitus I Sister   ? ? ?Social History ?Social History  ? ?Tobacco Use  ? Smoking status: Some Days  ?  Packs/day: 1.00  ?  Years: 3.00  ?  Pack years: 3.00  ?  Types: Cigarettes  ?  Last attempt to quit: 12/11/2013  ?  Years since quitting: 8.0  ? Smokeless tobacco: Never  ?Vaping Use  ? Vaping Use: Never used  ?Substance Use Topics  ? Alcohol use: Yes  ?  Comment: occasionally   ? Drug use: Not Currently  ?  Frequency: 2.0 times per week  ?  Types: Marijuana  ?  Comment: 11/27/2015 "used to use marijuana; used it last  in ~ 2014"  ? ? ? ?Allergies   ?Patient has no known allergies. ? ? ?Review of Systems ?Review of Systems  ?Constitutional:  Positive for activity change, appetite change and fatigue. Negative for fever.  ?HENT:  Negative for congestion, ear pain, sinus pressure, sinus pain and sore throat.   ?Eyes: Negative.   ?Respiratory:  Positive for cough. Negative for shortness of breath.   ?Cardiovascular: Negative.   ?Gastrointestinal:  Positive for abdominal distention, abdominal pain, constipation,  nausea and vomiting.  ?Skin: Negative.   ?Psychiatric/Behavioral: Negative.    ? ? ?Physical Exam ?Triage Vital Signs ?ED Triage Vitals  ?Enc Vitals Group  ?   BP 12/16/21 1236 140/78  ?   Pulse Rate 12/16/21 1236 100  ?   Resp 12/16/21 1236 18  ?   Temp 12/16/21 1236 98.4 ?F (36.9 ?C)  ?   Temp Source 12/16/21 1236 Oral  ?   SpO2 12/16/21 1236 97 %  ?   Weight --   ?   Height --   ?   Head Circumference --   ?   Peak Flow --   ?   Pain Score 12/16/21 1237 5  ?   Pain Loc --   ?  Pain Edu? --   ?   Excl. in GC? --   ? ?No data found. ? ?Updated Vital Signs ?BP 140/78 (BP Location: Right Arm)   Pulse 100   Temp 98.4 ?F (36.9 ?C) (Oral)   Resp 18   SpO2 97%  ? ?Visual Acuity ?Right Eye Distance:   ?Left Eye Distance:   ?Bilateral Distance:   ? ?Right Eye Near:   ?Left Eye Near:    ?Bilateral Near:    ? ?Physical Exam ?Vitals reviewed.  ?Constitutional:   ?   General: He is not in acute distress. ?   Appearance: He is well-developed.  ?HENT:  ?   Head: Normocephalic and atraumatic.  ?   Mouth/Throat:  ?   Mouth: Mucous membranes are moist.  ?Eyes:  ?   Extraocular Movements: Extraocular movements intact.  ?   Pupils: Pupils are equal, round, and reactive to light.  ?Cardiovascular:  ?   Rate and Rhythm: Normal rate and regular rhythm.  ?   Heart sounds: Normal heart sounds.  ?Pulmonary:  ?   Effort: Pulmonary effort is normal.  ?   Breath sounds: Normal breath sounds. No wheezing, rhonchi or rales.  ?Chest:  ?   Chest wall: No tenderness.  ?Abdominal:  ?   General: Bowel sounds are normal. There is distension.  ?   Tenderness: There is generalized abdominal tenderness.  ?Skin: ?   General: Skin is warm and dry.  ?Neurological:  ?   Mental Status: He is alert and oriented to person, place, and time.  ?Psychiatric:     ?   Mood and Affect: Mood normal.     ?   Behavior: Behavior normal.  ? ? ? ?UC Treatments / Results  ?Labs ?(all labs ordered are listed, but only abnormal results are displayed) ?Labs Reviewed  ?CBG  MONITORING, ED - Abnormal; Notable for the following components:  ?    Result Value  ? Glucose-Capillary 303 (*)   ? All other components within normal limits  ?SARS CORONAVIRUS 2 (TAT 6-24 HRS)  ?POC INFLUENZ

## 2021-12-16 NOTE — Discharge Instructions (Addendum)
Your influenza test is negative today.  You will be contacted if your COVID test is positive. ?Your blood sugar was elevated in the office today.  As discussed, it is important to start taking your diabetes medication and monitoring your blood glucose regularly.  Please also consider making dietary changes and starting regular exercise to control not only your diabetes, but your high blood pressure and elevated triglycerides. ?Increase fluids to help lower your blood sugar, drink water preferably. ?Get plenty of rest. ?I am going to request PCP assistance for you to help you find a primary care physician. You should be contacted within the next week.  ?Continue to check your blood glucose at home.  If your blood glucose is greater than 450, you will need to go to the emergency department immediately. ?Follow-up as needed. ?

## 2021-12-16 NOTE — ED Triage Notes (Signed)
Pt presents with abdominal pain, pt states he has been constipated. Has small BM today. Patient states coughing, vomiting, and chest pain. States chest feels sore and heavy. ?

## 2021-12-26 ENCOUNTER — Encounter (HOSPITAL_COMMUNITY): Payer: Self-pay

## 2022-01-15 ENCOUNTER — Other Ambulatory Visit: Payer: Self-pay | Admitting: Nurse Practitioner

## 2022-03-10 ENCOUNTER — Emergency Department (HOSPITAL_BASED_OUTPATIENT_CLINIC_OR_DEPARTMENT_OTHER): Payer: BC Managed Care – PPO

## 2022-03-10 ENCOUNTER — Other Ambulatory Visit: Payer: Self-pay

## 2022-03-10 ENCOUNTER — Emergency Department (HOSPITAL_BASED_OUTPATIENT_CLINIC_OR_DEPARTMENT_OTHER)
Admission: EM | Admit: 2022-03-10 | Discharge: 2022-03-10 | Disposition: A | Discharge status: Home / Self Care | Payer: BC Managed Care – PPO | Attending: Emergency Medicine | Admitting: Emergency Medicine

## 2022-03-10 ENCOUNTER — Encounter (HOSPITAL_BASED_OUTPATIENT_CLINIC_OR_DEPARTMENT_OTHER): Payer: Self-pay | Admitting: Emergency Medicine

## 2022-03-10 DIAGNOSIS — I10 Essential (primary) hypertension: Secondary | ICD-10-CM | POA: Insufficient documentation

## 2022-03-10 DIAGNOSIS — Z7984 Long term (current) use of oral hypoglycemic drugs: Secondary | ICD-10-CM | POA: Diagnosis not present

## 2022-03-10 DIAGNOSIS — R109 Unspecified abdominal pain: Secondary | ICD-10-CM | POA: Diagnosis present

## 2022-03-10 DIAGNOSIS — R10A2 Flank pain, left side: Secondary | ICD-10-CM

## 2022-03-10 DIAGNOSIS — E119 Type 2 diabetes mellitus without complications: Secondary | ICD-10-CM | POA: Insufficient documentation

## 2022-03-10 DIAGNOSIS — R1012 Left upper quadrant pain: Secondary | ICD-10-CM | POA: Diagnosis not present

## 2022-03-10 DIAGNOSIS — R1013 Epigastric pain: Secondary | ICD-10-CM | POA: Insufficient documentation

## 2022-03-10 DIAGNOSIS — Z794 Long term (current) use of insulin: Secondary | ICD-10-CM | POA: Insufficient documentation

## 2022-03-10 LAB — COMPREHENSIVE METABOLIC PANEL
ALT: 20 U/L (ref 0–44)
AST: 17 U/L (ref 15–41)
Albumin: 4.4 g/dL (ref 3.5–5.0)
Alkaline Phosphatase: 56 U/L (ref 38–126)
Anion gap: 7 (ref 5–15)
BUN: 7 mg/dL (ref 6–20)
CO2: 25 mmol/L (ref 22–32)
Calcium: 9.1 mg/dL (ref 8.9–10.3)
Chloride: 101 mmol/L (ref 98–111)
Creatinine, Ser: 0.7 mg/dL (ref 0.61–1.24)
GFR, Estimated: >60 mL/min (ref >60)
Glucose, Bld: 229 mg/dL — ABNORMAL HIGH (ref 70–99)
Potassium: 3.7 mmol/L (ref 3.5–5.1)
Sodium: 133 mmol/L — ABNORMAL LOW (ref 135–145)
Total Bilirubin: 1.5 mg/dL — ABNORMAL HIGH (ref 0.3–1.2)
Total Protein: 7.3 g/dL (ref 6.5–8.1)

## 2022-03-10 LAB — URINALYSIS, MICROSCOPIC (REFLEX)

## 2022-03-10 LAB — URINALYSIS, ROUTINE W REFLEX MICROSCOPIC
Bilirubin Urine: NEGATIVE
Glucose, UA: >=500 mg/dL — AB
Ketones, ur: NEGATIVE mg/dL
Leukocytes,Ua: NEGATIVE
Nitrite: NEGATIVE
Protein, ur: NEGATIVE mg/dL
Specific Gravity, Urine: 1.02 (ref 1.005–1.030)
pH: 5.5 (ref 5.0–8.0)

## 2022-03-10 LAB — CBC WITH DIFFERENTIAL/PLATELET
Abs Immature Granulocytes: 0.01 10*3/uL (ref 0.00–0.07)
Basophils Absolute: 0 10*3/uL (ref 0.0–0.1)
Basophils Relative: 0 %
Eosinophils Absolute: 0 10*3/uL (ref 0.0–0.5)
Eosinophils Relative: 1 %
HCT: 38.8 % — ABNORMAL LOW (ref 39.0–52.0)
Hemoglobin: 14.2 g/dL (ref 13.0–17.0)
Immature Granulocytes: 0 %
Lymphocytes Relative: 47 %
Lymphs Abs: 2.2 10*3/uL (ref 0.7–4.0)
MCH: 29.8 pg (ref 26.0–34.0)
MCHC: 36.6 g/dL — ABNORMAL HIGH (ref 30.0–36.0)
MCV: 81.3 fL (ref 80.0–100.0)
Monocytes Absolute: 0.3 10*3/uL (ref 0.1–1.0)
Monocytes Relative: 7 %
Neutro Abs: 2.1 10*3/uL (ref 1.7–7.7)
Neutrophils Relative %: 45 %
Platelets: 228 10*3/uL (ref 150–400)
RBC: 4.77 MIL/uL (ref 4.22–5.81)
RDW: 11.9 % (ref 11.5–15.5)
WBC: 4.7 10*3/uL (ref 4.0–10.5)
nRBC: 0 % (ref 0.0–0.2)

## 2022-03-10 MED ORDER — KETOROLAC TROMETHAMINE 15 MG/ML IJ SOLN
15.0000 mg | Freq: Once | INTRAMUSCULAR | Status: AC
  Administered 2022-03-10: 15 mg via INTRAVENOUS
  Filled 2022-03-10: qty 1

## 2022-03-10 MED ORDER — DICYCLOMINE HCL 10 MG/ML IM SOLN
20.0000 mg | Freq: Once | INTRAMUSCULAR | Status: AC
Start: 2022-03-10 — End: 2022-03-10
  Administered 2022-03-10: 20 mg via INTRAMUSCULAR
  Filled 2022-03-10: qty 2

## 2022-03-10 MED ORDER — KETOROLAC TROMETHAMINE 15 MG/ML IJ SOLN: 15.0000 mg | Freq: Once | INTRAMUSCULAR | Status: DC

## 2022-03-10 MED ORDER — SODIUM CHLORIDE 0.9 % IV BOLUS
500.0000 mL | Freq: Once | INTRAVENOUS | Status: AC
  Administered 2022-03-10: 500 mL via INTRAVENOUS

## 2022-03-10 MED ORDER — FENTANYL CITRATE PF 50 MCG/ML IJ SOSY
50.0000 ug | PREFILLED_SYRINGE | Freq: Once | INTRAMUSCULAR | Status: AC
  Administered 2022-03-10: 50 ug via INTRAVENOUS
  Filled 2022-03-10: qty 1

## 2022-03-10 MED ORDER — DICYCLOMINE HCL 20 MG PO TABS: 20.0000 mg | ORAL_TABLET | Freq: Two times a day (BID) | ORAL | 0 refills | Status: AC

## 2022-03-10 NOTE — ED Triage Notes (Signed)
Reports left sided flank pain since yesterday. Reports going to a clinic at his job and they told him his urine was + for blood. Sent here for eval for possible kidney stone. Endorses dysuria.

## 2022-03-10 NOTE — Discharge Instructions (Addendum)
Please return to the ED with any new symptoms such as chest pain, shortness of breath, increased flank pain, inability to urinate, fevers I have referred you to a PCP.  Please call and make an appointment to be seen.  The numbers attached to this form. Please pick up prescription Bentyl that I have sent in for you.  Please also purchase MiraLAX.  Please take 2 capfuls of MiraLAX mixed with 16 ounces of water. Please read attached informational guide concerning flank pain Please find attached work note

## 2022-03-10 NOTE — ED Provider Notes (Addendum)
[facility] EMERGENCY DEPARTMENT Provider Note   CSN: [id number] Arrival date & time: 03/10/22  1338     History  Chief Complaint  Patient presents with   Flank Pain   Dysuria    [name] is a 31 y.o. male with history of chronic pancreatitis, some, hepatic steatosis, bronchitis, GERD, hypertension, type 2 diabetes.  Patient presents to the ED for evaluation of left-sided flank pain.  Patient states that last night before going to bed he started to develop a "discomfort" in the left side of his back.  Patient states around 4 AM this morning, he was awoken from his sleep with a more persistent and aching pain in the left side of his back.  Patient states that around 6 AM, he went to urinate and noticed a referred pain to his penis as well as burning with urination.  Patient continues that he felt he had to "force it out" to begin his urine stream.  Patient states that he went to work this morning, was seen in the clinic at his job and noted to have blood in his urine.  Patient also adds that for the last 5 days he has been more constipated than usual, having to strain to have bowel movements.  Patient denies any recent trauma to account for the pain.  The patient denies any recent heavy lifting.  States patient denies any recent new sexual encounters, states that his umbilicus relationship with his girlfriend. The patient was sent here for further evaluation.  Patient endorsing left-sided flank pain, dysuria.  Patient denies any fevers, nausea, vomiting, body aches or chills, abdominal pain.   Flank Pain Pertinent negatives include no abdominal pain.  Dysuria Presenting symptoms: dysuria   Associated symptoms: flank pain   Associated symptoms: no abdominal pain, no fever, no nausea and no vomiting       Home Medications Prior to Admission medications   Medication Sig Start Date End Date Taking? Authorizing Provider  dicyclomine (BENTYL) 20 MG tablet Take 1 tablet (20 mg  total) by mouth 2 (two) times daily. 03/10/22  Yes Al DecantGroce, Amjad Fikes F, PA-C  albuterol (PROVENTIL HFA;VENTOLIN HFA) 108 (90 Base) MCG/ACT inhaler Inhale 1-2 puffs into the lungs every 6 (six) hours as needed for wheezing or shortness of breath. 01/11/19   Mardella LaymanHagler, Brian, MD  ibuprofen (ADVIL) 600 MG tablet Take 1 tablet (600 mg total) by mouth every 6 (six) hours as needed. Patient not taking: Reported on 03/02/2020 07/31/19   Burgess AmorIdol, Julie, PA-C  insulin glargine (LANTUS) 100 UNIT/ML injection Inject 0.2 mLs (20 Units total) into the skin at bedtime. 12/16/21 01/15/22  Leath-Warren, Sadie Haberhristie J, NP  Insulin Glargine (LANTUS) 100 UNIT/ML Solostar Pen Inject 20 Units into the skin daily at 10 pm. Patient not taking: Reported on 07/30/2019 12/20/17   Marcine MatarJohnson, Deborah B, MD  metFORMIN (GLUCOPHAGE) 500 MG tablet Take 1 tablet (500 mg total) by mouth 2 (two) times daily with a meal. Patient not taking: Reported on 03/02/2020 07/31/19   Burgess AmorIdol, Julie, PA-C  metFORMIN (GLUCOPHAGE) 500 MG tablet Take 1 tablet (500 mg total) by mouth 2 (two) times daily with a meal. 12/16/21 01/15/22  Leath-Warren, Sadie Haberhristie J, NP  naproxen (NAPROSYN) 375 MG tablet Take 1 tablet (375 mg total) by mouth 2 (two) times daily. 01/06/21   Raspet, Noberto RetortErin K, PA-C  pantoprazole (PROTONIX) 40 MG tablet Take 1 tablet (40 mg total) by mouth daily. 12/16/21 01/15/22  Leath-Warren, Sadie Haberhristie J, NP  tiZANidine (ZANAFLEX) 4 MG  capsule Take 1 capsule (4 mg total) by mouth 2 (two) times daily as needed for muscle spasms. 01/06/21   Raspet, Noberto Retort, PA-C      Allergies    Patient has no known allergies.    Review of Systems   Review of Systems  Constitutional:  Negative for chills and fever.  Gastrointestinal:  Negative for abdominal pain, nausea and vomiting.  Genitourinary:  Positive for difficulty urinating, dysuria and flank pain.  All other systems reviewed and are negative.  Physical Exam Updated Vital Signs BP (!) 132/91   Pulse 64   Temp 98.3 F  (36.8 C) (Oral)   Resp 18   Ht  (1.753 m)   Wt 105.7 kg   SpO2 100%   BMI 34.41 kg/m  Physical Exam Vitals and nursing note reviewed.  Constitutional:      General: He is not in acute distress.    Appearance: Normal appearance. He is not ill-appearing, toxic-appearing or diaphoretic.  HENT:     Head: Normocephalic and atraumatic.     Nose: Nose normal.     Mouth/Throat:     Mouth: Mucous membranes are moist.     Pharynx: Oropharynx is clear.  Eyes:     Extraocular Movements: Extraocular movements intact.     Conjunctiva/sclera: Conjunctivae normal.     Pupils: Pupils are equal, round, and reactive to light.  Cardiovascular:     Rate and Rhythm: Normal rate and regular rhythm.  Pulmonary:     Effort: Pulmonary effort is normal.     Breath sounds: Normal breath sounds. No wheezing.  Abdominal:     General: Abdomen is flat. Bowel sounds are normal.     Palpations: Abdomen is soft.     Tenderness: There is abdominal tenderness in the epigastric area and left upper quadrant. There is left CVA tenderness. There is no right CVA tenderness.  Musculoskeletal:     Cervical back: Normal range of motion and neck supple. No tenderness.  Skin:    General: Skin is warm and dry.     Capillary Refill: Capillary refill takes less than 2 seconds.  Neurological:     Mental Status: He is alert and oriented to person, place, and time.    ED Results / Procedures / Treatments   Labs (all labs ordered are listed, but only abnormal results are displayed) Labs Reviewed  URINALYSIS, ROUTINE W REFLEX MICROSCOPIC - Abnormal; Notable for the following components:      Result Value   Glucose, UA >=500 (*)    Hgb urine dipstick TRACE (*)    All other components within normal limits  CBC WITH DIFFERENTIAL/PLATELET - Abnormal; Notable for the following components:   HCT 38.8 (*)    MCHC 36.6 (*)    All other components within normal limits  COMPREHENSIVE METABOLIC PANEL - Abnormal; Notable  for the following components:   Sodium 133 (*)    Glucose, Bld 229 (*)    Total Bilirubin 1.5 (*)    All other components within normal limits  URINALYSIS, MICROSCOPIC (REFLEX) - Abnormal; Notable for the following components:   Bacteria, UA RARE (*)    All other components within normal limits    EKG None  Radiology CT Renal Stone Study  Result Date: 03/10/2022 CLINICAL DATA:  Flank pain, kidney stone suspected EXAM: CT ABDOMEN AND PELVIS WITHOUT CONTRAST TECHNIQUE: Multidetector CT imaging of the abdomen and pelvis was performed following the standard protocol without IV contrast. RADIATION DOSE REDUCTION:  This exam was performed according to the departmental dose-optimization program which includes automated exposure control, adjustment of the mA and/or kV according to patient size and/or use of iterative reconstruction technique. COMPARISON:  None Available. FINDINGS: Lower chest: No acute abnormality. Hepatobiliary: No focal liver abnormality is seen. Mild hepatomegaly. There is likely biliary sludge seen in the dependent gallbladder without evidence of cholecystitis. No intra or extrahepatic biliary dilatation. Pancreas: Unremarkable. No pancreatic ductal dilatation or surrounding inflammatory changes. Spleen: Normal in size without focal abnormality. Adrenals/Urinary Tract: Adrenal glands are unremarkable. Kidneys are normal, without renal calculi, focal lesion, or hydronephrosis. Bladder is unremarkable. Stomach/Bowel: Stomach is within normal limits. Appendix appears normal. No evidence of bowel wall thickening, distention, or inflammatory changes. Small to moderate stool burden in the colon. Vascular/Lymphatic: No significant vascular findings are present. No enlarged abdominal or pelvic lymph nodes. Reproductive: Prostate is unremarkable. Other: No abdominal wall hernia or abnormality. No abdominopelvic ascites. Musculoskeletal: No acute or significant osseous findings. IMPRESSION: Both  kidneys have a normal appearance without evidence of nephroureterolithiasis or hydroureteronephrosis. Mild hepatomegaly. Small to moderate stool burden in the colon. Electronically Signed   By: Marjo Bicker M.D.   On: 03/10/2022 15:12    Procedures Procedures   Medications Ordered in ED Medications  dicyclomine (BENTYL) injection 20 mg (has no administration in time range)  fentaNYL (SUBLIMAZE) injection 50 mcg (has no administration in time range)  sodium chloride 0.9 % bolus 500 mL (500 mLs Intravenous New Bag/Given 03/10/22 1552)  ketorolac (TORADOL) 15 MG/ML injection 15 mg (15 mg Intravenous Given 03/10/22 1554)    ED Course/ Medical Decision Making/ A&P                           Medical Decision Making Amount and/or Complexity of Data Reviewed Labs: ordered.  Risk Prescription drug management.   31 year old male presents to the ED for evaluation of left-sided flank pain.  Please see HPI for further details.  On examination, the patient is afebrile tachycardic.  The patient's lung sounds are clear laterally he has not.  Patient's abdomen is soft, compressible however the patient does endorse tenderness in his left upper quadrant and epigastric region.  The patient denies any recent alcohol use, he has a history of pancreatitis.  Patient denies any diarrhea.  Patient does have left-sided CVA tenderness.  Patient follows commands.  Patient nontoxic in appearance.  Patient alert and oriented x3.  Patient worked up utilizing the following labs and imaging studies interpreted by me personally: - CMP shows decreased sodium 133, elevated glucose to 229.  Patient states that he is noncompliant on his diabetic medication dose of metformin and insulin - CBC is unremarkable, there is no elevated white blood cell count indicate infection - Urinalysis shows greater than 500 glucose, trace hemoglobin urine.  No bacteria. - CT renal stone study shows no evidence of nephrolithiasis.  There is no  evidence of bladder distention.  There is no appearance of fat stranding about the kidneys to suggest pyelonephritis.  I agree with radiologist interpretation.  Patient treated with 100 mL normal saline, 15 mg Toradol.  Patient states that pain has decreased slightly however still present.  Unrelated, patient was advised to begin taking his diabetic medications as prescribed.  Patient stated that he has not taken his diabetic medications in quite some time.  Patient states that he takes the metformin "whenever it suits me".  At this point, cause of patient pain  could be related to constipation versus previously passed kidney stone.  The patient will be sent home with Bentyl, encouraged to purchase MiraLAX.  The patient was given return precautions to include chest pain, shortness of breath, increased left flank pain, inability to urinate, fevers.  The patient voiced understanding of these instructions.  The patient had all his questions answered to his satisfaction..  The patient is stable at this time for discharge home.    Final Clinical Impression(s) / ED Diagnoses Final diagnoses:  Left flank pain    Rx / DC Orders ED Discharge Orders          Ordered    dicyclomine (BENTYL) 20 MG tablet  2 times daily        03/10/22 1633                Al Decant, PA-C 03/10/22 1644    Long, Arlyss Repress, MD 03/10/22 [phone]

## 2022-04-28 ENCOUNTER — Encounter (HOSPITAL_BASED_OUTPATIENT_CLINIC_OR_DEPARTMENT_OTHER): Payer: Self-pay

## 2022-04-28 ENCOUNTER — Emergency Department (HOSPITAL_BASED_OUTPATIENT_CLINIC_OR_DEPARTMENT_OTHER): Payer: BC Managed Care – PPO

## 2022-04-28 ENCOUNTER — Emergency Department (HOSPITAL_BASED_OUTPATIENT_CLINIC_OR_DEPARTMENT_OTHER)
Admission: EM | Admit: 2022-04-28 | Discharge: 2022-04-28 | Disposition: A | Discharge status: Home / Self Care | Payer: BC Managed Care – PPO | Attending: Emergency Medicine | Admitting: Emergency Medicine

## 2022-04-28 DIAGNOSIS — R109 Unspecified abdominal pain: Secondary | ICD-10-CM | POA: Diagnosis present

## 2022-04-28 DIAGNOSIS — I10 Essential (primary) hypertension: Secondary | ICD-10-CM | POA: Diagnosis not present

## 2022-04-28 DIAGNOSIS — E119 Type 2 diabetes mellitus without complications: Secondary | ICD-10-CM | POA: Insufficient documentation

## 2022-04-28 DIAGNOSIS — Z7984 Long term (current) use of oral hypoglycemic drugs: Secondary | ICD-10-CM | POA: Insufficient documentation

## 2022-04-28 DIAGNOSIS — Z794 Long term (current) use of insulin: Secondary | ICD-10-CM | POA: Diagnosis not present

## 2022-04-28 DIAGNOSIS — R1084 Generalized abdominal pain: Secondary | ICD-10-CM | POA: Diagnosis not present

## 2022-04-28 DIAGNOSIS — R1110 Vomiting, unspecified: Secondary | ICD-10-CM | POA: Insufficient documentation

## 2022-04-28 LAB — COMPREHENSIVE METABOLIC PANEL
ALT: 17 U/L (ref 0–44)
AST: 21 U/L (ref 15–41)
Albumin: 4.5 g/dL (ref 3.5–5.0)
Alkaline Phosphatase: 81 U/L (ref 38–126)
Anion gap: 16 — ABNORMAL HIGH (ref 5–15)
BUN: 10 mg/dL (ref 6–20)
CO2: 19 mmol/L — ABNORMAL LOW (ref 22–32)
Calcium: 9.8 mg/dL (ref 8.9–10.3)
Chloride: 99 mmol/L (ref 98–111)
Creatinine, Ser: 0.89 mg/dL (ref 0.61–1.24)
GFR, Estimated: >60 mL/min (ref >60)
Glucose, Bld: 391 mg/dL — ABNORMAL HIGH (ref 70–99)
Potassium: 4.9 mmol/L (ref 3.5–5.1)
Sodium: 134 mmol/L — ABNORMAL LOW (ref 135–145)
Total Bilirubin: 0.5 mg/dL (ref 0.3–1.2)
Total Protein: 6.5 g/dL (ref 6.5–8.1)

## 2022-04-28 LAB — LIPASE, BLOOD: Lipase: 40 U/L (ref 11–51)

## 2022-04-28 LAB — CBC
HCT: 41.5 % (ref 39.0–52.0)
Hemoglobin: 15.3 g/dL (ref 13.0–17.0)
MCH: 30.7 pg (ref 26.0–34.0)
MCHC: 36.9 g/dL — ABNORMAL HIGH (ref 30.0–36.0)
MCV: 83.3 fL (ref 80.0–100.0)
Platelets: 209 10*3/uL (ref 150–400)
RBC: 4.98 MIL/uL (ref 4.22–5.81)
RDW: 11.9 % (ref 11.5–15.5)
WBC: 4.6 10*3/uL (ref 4.0–10.5)
nRBC: 0 % (ref 0.0–0.2)

## 2022-04-28 MED ORDER — IOHEXOL 300 MG/ML  SOLN
100.0000 mL | Freq: Once | INTRAMUSCULAR | Status: AC | PRN
  Administered 2022-04-28: 100 mL via INTRAVENOUS

## 2022-04-28 NOTE — Discharge Instructions (Signed)
Please return to the ED with any new or worsening signs or symptoms Please continue treating your aches and pains at home with ibuprofen and Tylenol Please follow-up with your primary care doctor for further management Please keep your finger buddy taped in position of comfort

## 2022-04-28 NOTE — ED Notes (Signed)
Dc instructions reviewed with pt no questions or concerns at this time.  

## 2022-04-28 NOTE — ED Triage Notes (Signed)
C/o abdominal pain since Saturday when he was moving a dresser and it fell back on his abdomen. Pt then fell onto back and right hand. Right index finger swelling/pain  Vomiting Sunday.

## 2022-04-28 NOTE — ED Provider Notes (Signed)
[facility] EMERGENCY DEPARTMENT Provider Note   CSN: [id number] Arrival date & time: 04/28/22  1054     History  Chief Complaint  Patient presents with   Abdominal Pain    Brent Costa is a 31 y.o. male with medical history of chronic pancreatitis, hepatic steatosis, chronic bronchitis, GERD, hypertension, type 2 diabetes.  The patient presents to the ED for evaluation of abdominal pain.  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  The patient states that he has had persistent pain since this time.  Patient reports that he did not hit his head or lose consciousness during this event.  The patient relays that he had 1 episode of vomiting and nausea on Sunday but none since then.  Patient also complaining of right index finger pain.  Patient states that he fell onto his right index finger after the dresser fell onto him on Saturday.  Patient denies any fevers, diarrhea, back pain, dysuria, loss of consciousness, abnormal bruising.   Abdominal Pain Associated symptoms: vomiting   Associated symptoms: no chills, no diarrhea, no dysuria, no fever and no nausea        Home Medications Prior to Admission medications   Medication Sig Start Date End Date Taking? Authorizing Provider  albuterol (PROVENTIL HFA;VENTOLIN HFA) 108 (90 Base) MCG/ACT inhaler Inhale 1-2 puffs into the lungs every 6 (six) hours as needed for wheezing or shortness of breath. 01/11/19   Mardella Layman, MD  dicyclomine (BENTYL) 20 MG tablet Take 1 tablet (20 mg total) by mouth 2 (two) times daily. 03/10/22   Al Decant, PA-C  ibuprofen (ADVIL) 600 MG tablet Take 1 tablet (600 mg total) by mouth every 6 (six) hours as needed. Patient not taking: Reported on 03/02/2020 07/31/19   Burgess Amor, PA-C  insulin glargine (LANTUS) 100 UNIT/ML injection Inject 0.2 mLs (20 Units total) into the skin at bedtime. 12/16/21 01/15/22   Leath-Warren, Sadie Haber, NP  Insulin Glargine (LANTUS) 100 UNIT/ML Solostar Pen Inject 20 Units into the skin daily at 10 pm. Patient not taking: Reported on 07/30/2019 12/20/17   Marcine Matar, MD  metFORMIN (GLUCOPHAGE) 500 MG tablet Take 1 tablet (500 mg total) by mouth 2 (two) times daily with a meal. Patient not taking: Reported on 03/02/2020 07/31/19   Burgess Amor, PA-C  metFORMIN (GLUCOPHAGE) 500 MG tablet Take 1 tablet (500 mg total) by mouth 2 (two) times daily with a meal. 12/16/21 01/15/22  Leath-Warren, Sadie Haber, NP  naproxen (NAPROSYN) 375 MG tablet Take 1 tablet (375 mg total) by mouth 2 (two) times daily. 01/06/21   Raspet, Noberto Retort, PA-C  pantoprazole (PROTONIX) 40 MG tablet Take 1 tablet (40 mg total) by mouth daily. 12/16/21 01/15/22  Leath-Warren, Sadie Haber, NP  tiZANidine (ZANAFLEX) 4 MG capsule Take 1 capsule (4 mg total) by mouth 2 (two) times daily as needed for muscle spasms. 01/06/21   Raspet, Noberto Retort, PA-C      Allergies    Patient has no known allergies.    Review of Systems   Review of Systems  Constitutional:  Negative for chills and fever.  Gastrointestinal:  Positive for abdominal pain and vomiting. Negative for diarrhea and nausea.  Genitourinary:  Negative for dysuria.  Musculoskeletal:  Positive for myalgias. Negative for back pain.  Neurological:  Negative for syncope and headaches.  Hematological:  Does not bruise/bleed easily.    Physical Exam  Updated Vital Signs BP 129/73   Pulse 77   Temp 98.8 F (37.1 C) (Oral)   Resp 16   Ht 5\' 9"  (1.753 m)   Wt 102.5 kg   SpO2 100%   BMI 33.37 kg/m  Physical Exam Vitals and nursing note reviewed.  Constitutional:      General: He is not in acute distress.    Appearance: He is well-developed. He is not ill-appearing, toxic-appearing or diaphoretic.  HENT:     Head: Normocephalic and atraumatic.     Nose: Nose normal. No congestion.     Mouth/Throat:     Mouth: Mucous membranes are moist.     Pharynx:  Oropharynx is clear.  Eyes:     General: No scleral icterus.    Extraocular Movements: Extraocular movements intact.     Pupils: Pupils are equal, round, and reactive to light.  Cardiovascular:     Rate and Rhythm: Normal rate and regular rhythm.  Pulmonary:     Effort: Pulmonary effort is normal.     Breath sounds: Normal breath sounds. No wheezing.  Abdominal:     General: Abdomen is flat. There is no distension. There are no signs of injury.     Palpations: Abdomen is soft.     Tenderness: There is generalized abdominal tenderness. There is no right CVA tenderness, left CVA tenderness or guarding.  Musculoskeletal:     Cervical back: Normal range of motion and neck supple. No rigidity or tenderness.  Skin:    General: Skin is warm and dry.     Capillary Refill: Capillary refill takes less than 2 seconds.  Neurological:     General: No focal deficit present.     Mental Status: He is alert and oriented to person, place, and time.     GCS: GCS eye subscore is 4. GCS verbal subscore is 5. GCS motor subscore is 6.     Cranial Nerves: Cranial nerves 2-12 are intact. No cranial nerve deficit.     Sensory: Sensation is intact. No sensory deficit.     Motor: Motor function is intact. No weakness.     Coordination: Coordination is intact. Heel to [facility] Test normal.     ED Results / Procedures / Treatments   Labs (all labs ordered are listed, but only abnormal results are displayed) Labs Reviewed  CBC - Abnormal; Notable for the following components:      Result Value   MCHC 36.9 (*)    All other components within normal limits  LIPASE, BLOOD  COMPREHENSIVE METABOLIC PANEL    EKG None  Radiology CT ABDOMEN PELVIS W CONTRAST  Result Date: 04/28/2022 CLINICAL DATA:  Abdominal pain after abdominal trauma. Furniture fell on the patient's abdomen 3 days ago. EXAM: CT ABDOMEN AND PELVIS WITH CONTRAST TECHNIQUE: Multidetector CT imaging of the abdomen and pelvis was performed using  the standard protocol following bolus administration of intravenous contrast. RADIATION DOSE REDUCTION: This exam was performed according to the departmental dose-optimization program which includes automated exposure control, adjustment of the mA and/or kV according to patient size and/or use of iterative reconstruction technique. CONTRAST:  04/30/2022 OMNIPAQUE IOHEXOL 300 MG/ML  SOLN COMPARISON:  Abdominopelvic CT 03/10/2022. FINDINGS: Lower chest: Clear lung bases. No significant pleural or pericardial effusion. Hepatobiliary: The liver is normal in density without suspicious focal abnormality. Incomplete distension of the gallbladder. No evidence of gallstones, gallbladder wall thickening or biliary dilatation. Pancreas: Unremarkable. No pancreatic ductal dilatation or surrounding inflammatory changes. Spleen: No evidence  of acute splenic injury or perisplenic hematoma. The spleen is normal in size without focal abnormality. Adrenals/Urinary Tract: Both adrenal glands appear normal. The kidneys appear normal without evidence of urinary tract calculus, suspicious lesion or hydronephrosis. The bladder appears unremarkable for its degree of distention. Stomach/Bowel: No enteric contrast administered. The stomach appears unremarkable for its degree of distension. No evidence of bowel wall thickening, distention or surrounding inflammatory change. The appendix appears normal. Vascular/Lymphatic: No enlarged abdominopelvic lymph nodes. No acute vascular findings, retroperitoneal hematoma or hemoperitoneum. Reproductive: The prostate gland and seminal vesicles appear unremarkable. Other: Intact abdominal wall without apparent significant contusion. No evidence of focal extraluminal fluid collection or pneumoperitoneum. Musculoskeletal: No acute or significant osseous findings. No evidence of acute fracture. IMPRESSION: Stable examination without acute posttraumatic findings. Electronically Signed   By: Carey Bullocks M.D.    On: 04/28/2022 14:22   DG Finger Index Right  Result Date: 04/28/2022 CLINICAL DATA:  Right index finger injury during fall several days ago. Index finger pain and swelling. EXAM: RIGHT INDEX FINGER 2+V COMPARISON:  None Available. FINDINGS: There is no evidence of fracture or dislocation. There is no evidence of arthropathy or other focal bone abnormality. Mild-to-moderate soft tissue swelling is noted. IMPRESSION: Soft tissue swelling. No evidence of fracture. Electronically Signed   By: Danae Orleans M.D.   On: 04/28/2022 12:14    Procedures Procedures   Medications Ordered in ED Medications  iohexol (OMNIPAQUE) 300 MG/ML solution 100 mL (100 mLs Intravenous Contrast Given 04/28/22 1403)    ED Course/ Medical Decision Making/ A&P                           Medical Decision Making Amount and/or Complexity of Data Reviewed Labs: ordered. Radiology: ordered.  Risk Prescription drug management.   31 year old male presents to the ED for evaluation.  Please see HPI for further details.  On examination, the patient is afebrile and nontachycardic.  Patient lung sounds clear bilaterally, not hypoxic on room air.  The patient abdomen has no overlying skin change, no abdominal ecchymosis.  All 4 quadrants of the abdomen are soft and compressible, there is no rebound or guarding.  The patient endorses generalized abdominal tenderness.  Patient neurological examination shows no focal neurodeficits.  Patient worked up utilizing the following labs and imaging studies interpreted by me personally: - CBC unremarkable, no elevated leukocytosis - CMP has not resulted.  [facility] labs are down so labs are send outs.  I discussed this with the patient and he is not willing to wait for the results. - Lipase has not yet resulted.  As above, lipase is a send out lab today at [facility].  The patient does not wish to wait for the results - CT abdomen pelvis shows no posttraumatic  injury to the abdomen or pelvis - Plain film imaging of patient right index finger shows no fracture, dislocation or deformity.  There is soft tissue swelling.  The patient will have his finger buddy taped in position of comfort  At this time, the patient will be discharged home and advised to follow-up with his primary care doctor for further management.  The patient was given return precautions and he voiced understanding of these.  The patient had all of his questions answered to his satisfaction prior to discharge.  The patient is stable for discharge at this time.   Final Clinical Impression(s) / ED Diagnoses Final diagnoses:  Generalized abdominal pain    Rx / DC Orders ED Discharge Orders     None         Al Decant, PA-C 04/28/22 1457    Melene Plan, DO 04/29/22 1459

## 2023-01-07 ENCOUNTER — Encounter (HOSPITAL_BASED_OUTPATIENT_CLINIC_OR_DEPARTMENT_OTHER): Payer: Self-pay | Admitting: *Deleted

## 2023-01-07 ENCOUNTER — Other Ambulatory Visit: Payer: Self-pay

## 2023-01-07 ENCOUNTER — Emergency Department (HOSPITAL_BASED_OUTPATIENT_CLINIC_OR_DEPARTMENT_OTHER)
Admission: EM | Admit: 2023-01-07 | Discharge: 2023-01-07 | Disposition: A | Discharge status: Home / Self Care | Payer: BC Managed Care – PPO | Attending: Emergency Medicine | Admitting: Emergency Medicine

## 2023-01-07 DIAGNOSIS — Z794 Long term (current) use of insulin: Secondary | ICD-10-CM | POA: Insufficient documentation

## 2023-01-07 DIAGNOSIS — R531 Weakness: Secondary | ICD-10-CM | POA: Insufficient documentation

## 2023-01-07 DIAGNOSIS — Z7984 Long term (current) use of oral hypoglycemic drugs: Secondary | ICD-10-CM | POA: Diagnosis not present

## 2023-01-07 DIAGNOSIS — E1165 Type 2 diabetes mellitus with hyperglycemia: Secondary | ICD-10-CM | POA: Insufficient documentation

## 2023-01-07 DIAGNOSIS — R739 Hyperglycemia, unspecified: Secondary | ICD-10-CM

## 2023-01-07 DIAGNOSIS — R42 Dizziness and giddiness: Secondary | ICD-10-CM

## 2023-01-07 LAB — CBC WITH DIFFERENTIAL/PLATELET
Abs Immature Granulocytes: 0.01 10*3/uL (ref 0.00–0.07)
Basophils Absolute: 0 10*3/uL (ref 0.0–0.1)
Basophils Relative: 0 %
Eosinophils Absolute: 0.1 10*3/uL (ref 0.0–0.5)
Eosinophils Relative: 2 %
HCT: 38.8 % — ABNORMAL LOW (ref 39.0–52.0)
Hemoglobin: 13.5 g/dL (ref 13.0–17.0)
Immature Granulocytes: 0 %
Lymphocytes Relative: 48 %
Lymphs Abs: 1.8 10*3/uL (ref 0.7–4.0)
MCH: 28.2 pg (ref 26.0–34.0)
MCHC: 34.8 g/dL (ref 30.0–36.0)
MCV: 81 fL (ref 80.0–100.0)
Monocytes Absolute: 0.3 10*3/uL (ref 0.1–1.0)
Monocytes Relative: 7 %
Neutro Abs: 1.7 10*3/uL (ref 1.7–7.7)
Neutrophils Relative %: 43 %
Platelets: 220 10*3/uL (ref 150–400)
RBC: 4.79 MIL/uL (ref 4.22–5.81)
RDW: 12.6 % (ref 11.5–15.5)
WBC: 3.9 10*3/uL — ABNORMAL LOW (ref 4.0–10.5)
nRBC: 0 % (ref 0.0–0.2)

## 2023-01-07 LAB — COMPREHENSIVE METABOLIC PANEL
ALT: 22 U/L (ref 0–44)
AST: 21 U/L (ref 15–41)
Albumin: 4.1 g/dL (ref 3.5–5.0)
Alkaline Phosphatase: 45 U/L (ref 38–126)
Anion gap: 10 (ref 5–15)
BUN: 11 mg/dL (ref 6–20)
CO2: 23 mmol/L (ref 22–32)
Calcium: 9.1 mg/dL (ref 8.9–10.3)
Chloride: 99 mmol/L (ref 98–111)
Creatinine, Ser: 0.77 mg/dL (ref 0.61–1.24)
GFR, Estimated: >60 mL/min (ref >60)
Glucose, Bld: 259 mg/dL — ABNORMAL HIGH (ref 70–99)
Potassium: 3.9 mmol/L (ref 3.5–5.1)
Sodium: 132 mmol/L — ABNORMAL LOW (ref 135–145)
Total Bilirubin: 1.2 mg/dL (ref 0.3–1.2)
Total Protein: 6.7 g/dL (ref 6.5–8.1)

## 2023-01-07 LAB — CBG MONITORING, ED
Glucose-Capillary: 262 mg/dL — ABNORMAL HIGH (ref 70–99)
Glucose-Capillary: 257 mg/dL — ABNORMAL HIGH (ref 70–99)
Glucose-Capillary: 200 mg/dL — ABNORMAL HIGH (ref 70–99)

## 2023-01-07 LAB — URINALYSIS, ROUTINE W REFLEX MICROSCOPIC
Bilirubin Urine: NEGATIVE
Glucose, UA: >=500 mg/dL — AB
Hgb urine dipstick: NEGATIVE
Ketones, ur: NEGATIVE mg/dL
Leukocytes,Ua: NEGATIVE
Nitrite: NEGATIVE
Protein, ur: NEGATIVE mg/dL
Specific Gravity, Urine: 1.025 (ref 1.005–1.030)
pH: 5.5 (ref 5.0–8.0)

## 2023-01-07 LAB — URINALYSIS, MICROSCOPIC (REFLEX)

## 2023-01-07 MED ORDER — SODIUM CHLORIDE 0.9 % IV BOLUS
1000.0000 mL | Freq: Once | INTRAVENOUS | Status: AC
  Administered 2023-01-07: 1000 mL via INTRAVENOUS

## 2023-01-07 MED ORDER — INSULIN ASPART 100 UNIT/ML IV SOLN
5.0000 [IU] | Freq: Once | INTRAVENOUS | Status: AC
  Administered 2023-01-07: 5 [IU] via INTRAVENOUS

## 2023-01-07 NOTE — ED Provider Notes (Signed)
[place] [facility]ERGENCY DEPARTMENT [facility] Provider Note   CSN: [id number] Arrival date & time: 01/07/23  [id number]     History  Chief Complaint  Patient presents with   Hyperglycemia    Brent Costa is a 32 y.o. male.  Patient with history of diabetes type 2 not compliant with medications however has improved the past 3 weeks on oral and insulin presents with lightheaded, general weakness and drinking increased water.  Patient had some nausea and vomiting over Easter but that is resolved.  Patient feels improved since receiving IV fluids.  No chest pain, shortness of breath, abdominal pain or blood in the stools.  Patient denies drug use.       Home Medications Prior to Admission medications   Medication Sig Start Date End Date Taking? Authorizing Provider  albuterol (PROVENTIL HFA;VENTOLIN HFA) 108 (90 Base) MCG/ACT inhaler Inhale 1-2 puffs into the lungs every 6 (six) hours as needed for wheezing or shortness of breath. 01/11/19   Vanessa Kick, MD  dicyclomine (BENTYL) 20 MG tablet Take 1 tablet (20 mg total) by mouth 2 (two) times daily. 03/10/22   Azucena Cecil, PA-C  ibuprofen (ADVIL) 600 MG tablet Take 1 tablet (600 mg total) by mouth every 6 (six) hours as needed. Patient not taking: Reported on 03/02/2020 07/31/19   Evalee Jefferson, PA-C  insulin glargine (LANTUS) 100 UNIT/ML injection Inject 0.2 mLs (20 Units total) into the skin at bedtime. 12/16/21 01/15/22  Leath-Warren, Alda Lea, NP  Insulin Glargine (LANTUS) 100 UNIT/ML Solostar Pen Inject 20 Units into the skin daily at 10 pm. Patient not taking: Reported on 07/30/2019 12/20/17   Ladell Pier, MD  metFORMIN (GLUCOPHAGE) 500 MG tablet Take 1 tablet (500 mg total) by mouth 2 (two) times daily with a meal. Patient not taking: Reported on 03/02/2020 07/31/19   Evalee Jefferson, PA-C  metFORMIN (GLUCOPHAGE) 500 MG tablet Take 1 tablet (500 mg total) by mouth 2 (two) times daily with a meal. 12/16/21 01/15/22   Leath-Warren, Alda Lea, NP  naproxen (NAPROSYN) 375 MG tablet Take 1 tablet (375 mg total) by mouth 2 (two) times daily. 01/06/21   Raspet, Derry Skill, PA-C  pantoprazole (PROTONIX) 40 MG tablet Take 1 tablet (40 mg total) by mouth daily. 12/16/21 01/15/22  Leath-Warren, Alda Lea, NP  tiZANidine (ZANAFLEX) 4 MG capsule Take 1 capsule (4 mg total) by mouth 2 (two) times daily as needed for muscle spasms. 01/06/21   Raspet, Derry Skill, PA-C      Allergies    Patient has no known allergies.    Review of Systems   Review of Systems  Constitutional:  Positive for appetite change. Negative for chills and fever.  HENT:  Negative for congestion.   Eyes:  Negative for visual disturbance.  Respiratory:  Negative for shortness of breath.   Cardiovascular:  Negative for chest pain.  Gastrointestinal:  Negative for abdominal pain and vomiting.  Genitourinary:  Negative for dysuria and flank pain.  Musculoskeletal:  Negative for back pain, neck pain and neck stiffness.  Skin:  Negative for rash.  Neurological:  Positive for light-headedness. Negative for headaches.    Physical Exam Updated Vital Signs BP (!) 150/84   Pulse 71   Temp 98.7 F (37.1 C) (Oral)   Resp (!) 22   SpO2 93%  Physical Exam Vitals and nursing note reviewed.  Constitutional:      General: He is not in acute distress.    Appearance: He is  well-developed.  HENT:     Head: Normocephalic and atraumatic.     Comments: Dry mm    Mouth/Throat:     Mouth: Mucous membranes are dry.  Eyes:     General:        Right eye: No discharge.        Left eye: No discharge.     Conjunctiva/sclera: Conjunctivae normal.  Neck:     Trachea: No tracheal deviation.  Cardiovascular:     Rate and Rhythm: Normal rate and regular rhythm.     Heart sounds: No murmur heard. Pulmonary:     Effort: Pulmonary effort is normal.     Breath sounds: Normal breath sounds.  Abdominal:     General: There is no distension.     Palpations: Abdomen is soft.      Tenderness: There is no abdominal tenderness. There is no guarding.  Musculoskeletal:     Cervical back: Normal range of motion and neck supple. No rigidity.  Skin:    General: Skin is warm.     Capillary Refill: Capillary refill takes less than 2 seconds.     Findings: No rash.  Neurological:     General: No focal deficit present.     Mental Status: He is alert.     Cranial Nerves: No cranial nerve deficit.  Psychiatric:        Mood and Affect: Mood normal.     ED Results / Procedures / Treatments   Labs (all labs ordered are listed, but only abnormal results are displayed) Labs Reviewed  COMPREHENSIVE METABOLIC PANEL - Abnormal; Notable for the following components:      Result Value   Sodium 132 (*)    Glucose, Bld 259 (*)    All other components within normal limits  CBC WITH DIFFERENTIAL/PLATELET - Abnormal; Notable for the following components:   WBC 3.9 (*)    HCT 38.8 (*)    All other components within normal limits  URINALYSIS, ROUTINE W REFLEX MICROSCOPIC - Abnormal; Notable for the following components:   Glucose, UA >=500 (*)    All other components within normal limits  URINALYSIS, MICROSCOPIC (REFLEX) - Abnormal; Notable for the following components:   Bacteria, UA RARE (*)    All other components within normal limits  CBG MONITORING, ED - Abnormal; Notable for the following components:   Glucose-Capillary 262 (*)    All other components within normal limits  CBG MONITORING, ED - Abnormal; Notable for the following components:   Glucose-Capillary 257 (*)    All other components within normal limits  CBG MONITORING, ED    EKG EKG Interpretation  Date/Time:  Thursday January 07 2023 10:00:09 EDT Ventricular Rate:  69 PR Interval:  161 QRS Duration: 83 QT Interval:  367 QTC Calculation: 394 R Axis:   83 Text Interpretation: Sinus rhythm Confirmed by Elnora Morrison [phone]) on 01/07/2023 10:10:34 AM  Radiology No results  found.  Procedures Procedures    Medications Ordered in ED Medications  sodium chloride 0.9 % bolus 1,000 mL ( Intravenous Stopped 01/07/23 1109)  insulin aspart (novoLOG) injection 5 Units (5 Units Intravenous Given 01/07/23 1117)    ED Course/ Medical Decision Making/ A&P                             Medical Decision Making Amount and/or Complexity of Data Reviewed Labs: ordered. ECG/medicine tests: ordered.  Risk OTC drugs.   Patient presents  with general weakness and lightheadedness and elevated glucose.  Sugar level was 288 and blood pressure was reportedly low.  Patient currently has minimal symptoms.  Differential includes dehydration, metabolic, diabetes related, viral, cardiac rhythm changes, other.  General blood work ordered and CBC reviewed no signs of significant anemia, hemoglobin, white blood cell count normal.  Electrolytes pending.  Urinalysis pending.  IV fluids and oral fluids water given.  EKG reviewed sinus rhythm no acute findings.  5 units of insulin given to improve sugar levels.  Discussed importance of compliance with medication outpatient follow-up.  Patient comfortable plan.  Patient improved significantly reassessment no symptoms.  Patient received insulin and IV fluids.  Blood work reassuring electrolytes unremarkable except for mild hyponatremia 132.  Urinalysis showed glucose in the urine.  Follow-up discussed.  Patient comfortable to plan.        Final Clinical Impression(s) / ED Diagnoses Final diagnoses:  Lightheadedness  Hyperglycemia    Rx / DC Orders ED Discharge Orders     None         Elnora Morrison, MD 01/07/23 1219

## 2023-01-07 NOTE — ED Triage Notes (Signed)
Pt is here for evaluation of feeling weak and dizzy this am.  He went to work despite not feeling well but began feeling worse and went to the health clinic at his work where they found his CBG was 288 and his BP was "low".  Pt reports that he is diabetic and takes novolog and lantus.  Pt is taking his insulin as prescribed.  Pt began tanking his medication again 3 weeks ago.  No CP with this.  Pt is alert and oriented.  Pt had some n/v on easter but has been otherwise well.

## 2023-01-07 NOTE — Discharge Instructions (Signed)
Make sure to take your medications as directed.  Stay well-hydrated with water.  Follow-up with your doctor as discussed.  Return for new concerns

## 2023-01-18 ENCOUNTER — Encounter: Payer: Self-pay | Admitting: *Deleted
